# Patient Record
Sex: Male | Born: 1945
Health system: Southern US, Community
[De-identification: ages and names within clinical notes are randomized; demographics above are authoritative.]

## PROBLEM LIST (undated history)

## (undated) DIAGNOSIS — H3530 Unspecified macular degeneration: Secondary | ICD-10-CM

## (undated) DIAGNOSIS — H409 Unspecified glaucoma: Secondary | ICD-10-CM

## (undated) DIAGNOSIS — Z86010 Personal history of colonic polyps: Secondary | ICD-10-CM

## (undated) DIAGNOSIS — I6523 Occlusion and stenosis of bilateral carotid arteries: Secondary | ICD-10-CM

## (undated) DIAGNOSIS — I2510 Atherosclerotic heart disease of native coronary artery without angina pectoris: Secondary | ICD-10-CM

## (undated) DIAGNOSIS — Z87442 Personal history of urinary calculi: Secondary | ICD-10-CM

## (undated) DIAGNOSIS — I10 Essential (primary) hypertension: Secondary | ICD-10-CM

## (undated) DIAGNOSIS — I252 Old myocardial infarction: Secondary | ICD-10-CM

## (undated) DIAGNOSIS — K644 Residual hemorrhoidal skin tags: Secondary | ICD-10-CM

## (undated) DIAGNOSIS — N201 Calculus of ureter: Secondary | ICD-10-CM

## (undated) DIAGNOSIS — Z973 Presence of spectacles and contact lenses: Secondary | ICD-10-CM

## (undated) DIAGNOSIS — K219 Gastro-esophageal reflux disease without esophagitis: Secondary | ICD-10-CM

## (undated) DIAGNOSIS — Z860101 Personal history of adenomatous and serrated colon polyps: Secondary | ICD-10-CM

## (undated) DIAGNOSIS — N400 Enlarged prostate without lower urinary tract symptoms: Secondary | ICD-10-CM

## (undated) DIAGNOSIS — Z955 Presence of coronary angioplasty implant and graft: Secondary | ICD-10-CM

## (undated) DIAGNOSIS — E785 Hyperlipidemia, unspecified: Secondary | ICD-10-CM

## (undated) DIAGNOSIS — J309 Allergic rhinitis, unspecified: Secondary | ICD-10-CM

## (undated) DIAGNOSIS — N200 Calculus of kidney: Secondary | ICD-10-CM

## (undated) DIAGNOSIS — H269 Unspecified cataract: Secondary | ICD-10-CM

## (undated) HISTORY — DX: Unspecified cataract: H26.9

## (undated) HISTORY — DX: Hyperlipidemia, unspecified: E78.5

## (undated) HISTORY — PX: EYE SURGERY: SHX253

## (undated) HISTORY — DX: Unspecified glaucoma: H40.9

## (undated) HISTORY — DX: Essential (primary) hypertension: I10

## (undated) HISTORY — PX: CARDIOVASCULAR STRESS TEST: SHX262

## (undated) HISTORY — DX: Residual hemorrhoidal skin tags: K64.4

## (undated) HISTORY — DX: Gastro-esophageal reflux disease without esophagitis: K21.9

## (undated) HISTORY — PX: CORONARY ANGIOPLASTY WITH STENT PLACEMENT: SHX49

## (undated) HISTORY — DX: Atherosclerotic heart disease of native coronary artery without angina pectoris: I25.10

## (undated) HISTORY — PX: COLONOSCOPY: SHX174

---

## 2002-02-19 ENCOUNTER — Ambulatory Visit (HOSPITAL_COMMUNITY): Admission: RE | Admit: 2002-02-19 | Discharge: 2002-02-19 | Payer: Self-pay | Admitting: *Deleted

## 2002-02-19 ENCOUNTER — Encounter (INDEPENDENT_AMBULATORY_CARE_PROVIDER_SITE_OTHER): Payer: Self-pay | Admitting: Specialist

## 2007-03-31 LAB — HM COLONOSCOPY: HM Colonoscopy: ABNORMAL

## 2008-03-11 HISTORY — PX: CATARACT EXTRACTION W/ INTRAOCULAR LENS  IMPLANT, BILATERAL: SHX1307

## 2009-05-09 DIAGNOSIS — I2510 Atherosclerotic heart disease of native coronary artery without angina pectoris: Secondary | ICD-10-CM

## 2009-05-09 HISTORY — DX: Atherosclerotic heart disease of native coronary artery without angina pectoris: I25.10

## 2009-05-25 ENCOUNTER — Inpatient Hospital Stay (HOSPITAL_COMMUNITY): Admission: AD | Admit: 2009-05-25 | Discharge: 2009-05-27 | Payer: Self-pay | Admitting: Cardiovascular Disease

## 2009-05-25 ENCOUNTER — Ambulatory Visit: Payer: Self-pay | Admitting: Radiology

## 2009-05-25 ENCOUNTER — Encounter: Payer: Self-pay | Admitting: Emergency Medicine

## 2009-05-25 ENCOUNTER — Ambulatory Visit: Payer: Self-pay | Admitting: Cardiology

## 2009-05-26 DIAGNOSIS — I219 Acute myocardial infarction, unspecified: Secondary | ICD-10-CM

## 2009-05-26 DIAGNOSIS — I252 Old myocardial infarction: Secondary | ICD-10-CM

## 2009-05-26 DIAGNOSIS — Z955 Presence of coronary angioplasty implant and graft: Secondary | ICD-10-CM

## 2009-05-26 HISTORY — DX: Presence of coronary angioplasty implant and graft: Z95.5

## 2009-05-26 HISTORY — DX: Acute myocardial infarction, unspecified: I21.9

## 2009-05-26 HISTORY — DX: Old myocardial infarction: I25.2

## 2009-06-12 DIAGNOSIS — E782 Mixed hyperlipidemia: Secondary | ICD-10-CM

## 2009-06-12 DIAGNOSIS — I10 Essential (primary) hypertension: Secondary | ICD-10-CM | POA: Insufficient documentation

## 2009-06-12 DIAGNOSIS — H269 Unspecified cataract: Secondary | ICD-10-CM

## 2009-06-12 DIAGNOSIS — K219 Gastro-esophageal reflux disease without esophagitis: Secondary | ICD-10-CM | POA: Insufficient documentation

## 2009-06-13 ENCOUNTER — Ambulatory Visit: Payer: Self-pay | Admitting: Cardiology

## 2009-06-13 ENCOUNTER — Encounter: Payer: Self-pay | Admitting: Physician Assistant

## 2009-06-13 DIAGNOSIS — I2510 Atherosclerotic heart disease of native coronary artery without angina pectoris: Secondary | ICD-10-CM | POA: Insufficient documentation

## 2009-06-21 ENCOUNTER — Encounter: Payer: Self-pay | Admitting: Cardiology

## 2009-07-11 ENCOUNTER — Encounter: Payer: Self-pay | Admitting: Cardiology

## 2009-07-13 ENCOUNTER — Encounter (INDEPENDENT_AMBULATORY_CARE_PROVIDER_SITE_OTHER): Payer: Self-pay | Admitting: *Deleted

## 2009-07-13 LAB — CONVERTED CEMR LAB
AST: 18 units/L (ref 0–37)
Albumin: 4.7 g/dL (ref 3.5–5.2)
Alkaline Phosphatase: 79 units/L (ref 39–117)
Total CHOL/HDL Ratio: 2.8
VLDL: 28 mg/dL (ref 0–40)

## 2009-08-01 ENCOUNTER — Encounter: Payer: Self-pay | Admitting: Internal Medicine

## 2009-08-21 ENCOUNTER — Encounter: Payer: Self-pay | Admitting: Cardiology

## 2009-08-23 ENCOUNTER — Encounter: Payer: Self-pay | Admitting: Cardiology

## 2009-08-23 ENCOUNTER — Ambulatory Visit: Payer: Self-pay | Admitting: Cardiology

## 2009-09-01 ENCOUNTER — Encounter (INDEPENDENT_AMBULATORY_CARE_PROVIDER_SITE_OTHER): Payer: Self-pay | Admitting: *Deleted

## 2009-09-01 DIAGNOSIS — R0989 Other specified symptoms and signs involving the circulatory and respiratory systems: Secondary | ICD-10-CM | POA: Insufficient documentation

## 2009-09-08 ENCOUNTER — Encounter: Payer: Self-pay | Admitting: Cardiology

## 2009-09-08 ENCOUNTER — Ambulatory Visit: Payer: Self-pay

## 2009-10-09 ENCOUNTER — Telehealth: Payer: Self-pay | Admitting: Internal Medicine

## 2009-10-09 ENCOUNTER — Ambulatory Visit: Payer: Self-pay | Admitting: Internal Medicine

## 2009-10-09 DIAGNOSIS — N401 Enlarged prostate with lower urinary tract symptoms: Secondary | ICD-10-CM | POA: Insufficient documentation

## 2009-10-26 ENCOUNTER — Encounter: Payer: Self-pay | Admitting: Cardiovascular Disease

## 2009-11-20 ENCOUNTER — Ambulatory Visit: Payer: Self-pay | Admitting: Internal Medicine

## 2009-11-20 LAB — CONVERTED CEMR LAB
BUN: 16 mg/dL (ref 6–23)
CO2: 28 meq/L (ref 19–32)
Chloride: 105 meq/L (ref 96–112)
Creatinine, Ser: 0.8 mg/dL (ref 0.40–1.50)
PSA: 2.02 ng/mL (ref 0.10–4.00)
Potassium: 4.5 meq/L (ref 3.5–5.3)
Sodium: 142 meq/L (ref 135–145)

## 2009-11-21 ENCOUNTER — Encounter: Payer: Self-pay | Admitting: Internal Medicine

## 2010-01-01 ENCOUNTER — Ambulatory Visit: Payer: Self-pay | Admitting: Internal Medicine

## 2010-01-01 DIAGNOSIS — J309 Allergic rhinitis, unspecified: Secondary | ICD-10-CM | POA: Insufficient documentation

## 2010-02-06 ENCOUNTER — Ambulatory Visit: Payer: Self-pay | Admitting: Internal Medicine

## 2010-02-06 DIAGNOSIS — J018 Other acute sinusitis: Secondary | ICD-10-CM

## 2010-02-14 ENCOUNTER — Encounter: Payer: Self-pay | Admitting: Cardiology

## 2010-02-14 ENCOUNTER — Ambulatory Visit: Payer: Self-pay | Admitting: Cardiology

## 2010-02-15 ENCOUNTER — Encounter: Payer: Self-pay | Admitting: Cardiology

## 2010-02-16 ENCOUNTER — Encounter (INDEPENDENT_AMBULATORY_CARE_PROVIDER_SITE_OTHER): Payer: Self-pay | Admitting: *Deleted

## 2010-02-16 LAB — CONVERTED CEMR LAB
ALT: 20 units/L (ref 0–53)
AST: 16 units/L (ref 0–37)
Bilirubin, Direct: 0.1 mg/dL (ref 0.0–0.3)
Glucose, Bld: 84 mg/dL (ref 70–99)
HDL: 37 mg/dL — ABNORMAL LOW (ref >39)
Indirect Bilirubin: 0.5 mg/dL (ref 0.0–0.9)
LDL Cholesterol: 36 mg/dL (ref 0–99)
Total CHOL/HDL Ratio: 3
VLDL: 38 mg/dL (ref 0–40)

## 2010-04-10 NOTE — Miscellaneous (Signed)
Summary: Orders Update  Clinical Lists Changes  Orders: Added new Test order of T-Basic Metabolic Panel [id number]) - Signed Added new Test order of T-TSH [phone]) - Signed Added new Test order of T-PSA ([id number]) - Signed

## 2010-04-10 NOTE — Assessment & Plan Note (Signed)
Summary: [facility]   Visit Type:  Follow-up Primary Provider:  Dondra Spry DO  CC:  Cough.  History of Present Illness: Pleasant gentleman admitted in March 2011 with a non-ST elevation myocardial infarction. He underwent cardiac catheterization which revealed - Left main was normal.  The LAD had proximal calcification. There was long proximal 30% stenosis.  Circumflex in the AV groove had proximal 60% stenosis at a mid obtuse marginal.  This first large mid obtuse marginal had ostial 70% stenosis.  The right coronary artery had a proximal long 50% stenosis.  There was mid subtotal stenosis.  The EF was 50% with inferior hypokinesis. Patient had a drug-eluting stent to the right coronary artery at that time. Abdominal ultrasound in July of 2011 showed no aneurysm. I last saw him in June of 2011. Since then the patient denies any dyspnea on exertion, orthopnea, PND, pedal edema, palpitations, syncope or chest pain.  Current Medications (verified): 1)  Aspirin 81 Mg Tbec (Aspirin) .... Take One Tablet By Mouth Daily 2)  Plavix 75 Mg Tabs (Clopidogrel Bisulfate) .... Take One Daily 3)  Metoprolol Tartrate 25 Mg Tabs (Metoprolol Tartrate) .... Take 1/2 Twice Daily 4)  Nitrostat 0.4 Mg Subl (Nitroglycerin) .... Take As Needed 5)  Crestor 40 Mg Tabs (Rosuvastatin Calcium) .... Take One Daily 6)  Amlodipine Besylate 10 Mg Tabs (Amlodipine Besylate) .... Take One Daily 7)  Centrum Silver  Tabs (Multiple Vitamins-Minerals) .... Take One Daily 8)  Losartan Potassium 100 Mg Tabs (Losartan Potassium) .... Take One Daily 9)  Icaps  Caps (Multiple Vitamins-Minerals) .... Take 1 Capsule By Mouth Once A Day 10)  Cefuroxime Axetil 500 Mg Tabs (Cefuroxime Axetil) .... One By Mouth Two Times A Day 11)  Prednisone 10 Mg Tabs (Prednisone) .... 3 Tabs By Mouth Once Daily X 3 Days, 2 Tabs By Mouth Once Daily X 3 Days, 1 Tab By Mouth Once Daily X 3 Days 12)  Hydrocodone-Homatropine 5-1.5 Mg/28ml Syrp  (Hydrocodone-Homatropine) .... 5 Ml By Mouth At Bedtime As Needed For Cough  Allergies (verified): No Known Drug Allergies  Past History:  Past Medical History:  1. Gastroesophageal reflux disease.   2. Hyperlipidemia.   3. Hypertension times 8-10 years.     4. Cataracts.   5. Coronary artery disease   March 2011 with a non-ST elevation myocardial infarction.  cardiac catheterization which revealed - Left main was normal.  The LAD had proximal calcification. There was long proximal 30% stenosis.  Circumflex in the AV groove had proximal 60% stenosis at a mid obtuse marginal.  This first large mid obtuse marginal had ostial 70% stenosis.  The right coronary artery had a proximal long 50% stenosis.  There was mid subtotal stenosis.  The EF was 50% with inferior hypokinesis. Patient had a drug-eluting stent to the right coronary artery  6.  Kidney stone  7.  Large external hemorrhoids  Past Surgical History: Cataract surgery.  PTCA/stent colonoscopy - 2009  (polyp) [facility]   Social History: Reviewed history from 10/09/2009 and no changes required.  married -43 yrs  Myriam Jacobson [place]) grew up in [place] He has one son - two grandaugthers   He is retired.  He had   a 40 pack-year smoking history but has discontinued.   prev occuptation -  [employer] Alcohol use-no  Review of Systems       Recent URI with nonproductive cough but no fevers or chills, hemoptysis, dysphasia, odynophagia, melena, hematochezia, dysuria, hematuria, rash, seizure activity,  orthopnea, PND, pedal edema, claudication. Remaining systems are negative.   Vital Signs:  Patient profile:   65 year old male Height:      64 inches Weight:      157.25 pounds BMI:     27.09 Pulse rate:   89 / minute Pulse rhythm:   regular Resp:     18 per minute BP sitting:   130 / 70  (left arm) Cuff size:   large  Vitals Entered By: Vikki Ports (February 14, 2010 9:20 AM)  Physical Exam  General:   Well-developed well-nourished in no acute distress.  Skin is warm and dry.  HEENT is normal.  Neck is supple. No thyromegaly.  Chest is clear to auscultation with normal expansion.  Cardiovascular exam is regular rate and rhythm.  Abdominal exam nontender or distended. No masses palpated. Extremities show no edema. neuro grossly intact    EKG  Procedure date:  02/14/2010  Findings:      Sinus rhythm with nonspecific ST changes.  Impression & Recommendations:  Problem # 1:  CAD, NATIVE VESSEL (ICD-414.01) Continue aspirin, Plavix, beta blocker and statin. Continue risk factor modification. His updated medication list for this problem includes:    Aspirin 81 Mg Tbec (Aspirin) .Marland Kitchen... Take one tablet by mouth daily    Plavix 75 Mg Tabs (Clopidogrel bisulfate) .Marland Kitchen... Take one daily    Metoprolol Tartrate 25 Mg Tabs (Metoprolol tartrate) .Marland Kitchen... Take 1/2 twice daily    Nitrostat 0.4 Mg Subl (Nitroglycerin) .Marland Kitchen... Take as needed    Amlodipine Besylate 10 Mg Tabs (Amlodipine besylate) .Marland Kitchen... Take one daily  Problem # 2:  HYPERTENSION (ICD-401.9)  Blood pressure controlled on present medications. Will continue. Check potassium and renal function. His updated medication list for this problem includes:    Aspirin 81 Mg Tbec (Aspirin) .Marland Kitchen... Take one tablet by mouth daily    Metoprolol Tartrate 25 Mg Tabs (Metoprolol tartrate) .Marland Kitchen... Take 1/2 twice daily    Amlodipine Besylate 10 Mg Tabs (Amlodipine besylate) .Marland Kitchen... Take one daily    Losartan Potassium 100 Mg Tabs (Losartan potassium) .Marland Kitchen... Take one daily  Orders: T-Basic Metabolic Panel [id number])  His updated medication list for this problem includes:    Aspirin 81 Mg Tbec (Aspirin) .Marland Kitchen... Take one tablet by mouth daily    Metoprolol Tartrate 25 Mg Tabs (Metoprolol tartrate) .Marland Kitchen... Take 1/2 twice daily    Amlodipine Besylate 10 Mg Tabs (Amlodipine besylate) .Marland Kitchen... Take one daily    Losartan Potassium 100 Mg Tabs (Losartan potassium) .Marland Kitchen...  Take one daily  Problem # 3:  HYPERLIPIDEMIA (ICD-272.4) Continue statin. Check lipids and liver. His updated medication list for this problem includes:    Crestor 40 Mg Tabs (Rosuvastatin calcium) .Marland Kitchen... Take one daily  Orders: T-Hepatic Function [id number]) T-Lipid Profile [id number])  His updated medication list for this problem includes:    Crestor 40 Mg Tabs (Rosuvastatin calcium) .Marland Kitchen... Take one daily  Problem # 4:  RHINOSINUSITIS, ACUTE (ICD-461.8) Continue present medications initiated by primary care.  Patient Instructions: 1)  Your physician wants you to follow-up in: ONE YEAR  You will receive a reminder letter in the mail two months in advance. If you don't receive a letter, please call our office to schedule the follow-up appointment. Prescriptions: CRESTOR 40 MG TABS (ROSUVASTATIN CALCIUM) take one daily  #90 x 3   Entered by:   Deliah Goody, RN   Authorized by:   Ferman Hamming, MD, [facility]   Signed by:   Stanton Kidney  Betsey Holiday, RN on 02/14/2010   Method used:   Print then Give to Patient   RxID:   [id number] METOPROLOL TARTRATE 25 MG TABS (METOPROLOL TARTRATE) take 1/2 twice daily  #90 x 3   Entered by:   Deliah Goody, RN   Authorized by:   Ferman Hamming, MD, [facility]   Signed by:   Deliah Goody, RN on 02/14/2010   Method used:   Print then Give to Patient   RxID:   [id number] PLAVIX 75 MG TABS (CLOPIDOGREL BISULFATE) take one daily  #90 x 3   Entered by:   Deliah Goody, RN   Authorized by:   Ferman Hamming, MD, [facility]   Signed by:   Deliah Goody, RN on 02/14/2010   Method used:   Print then Give to Patient   RxID:   [id number]

## 2010-04-10 NOTE — Miscellaneous (Signed)
Summary: [facility] Cardiac Progress Note   [facility] Cardiac Progress Note   Imported By: Roderic Ovens 11/14/2009 11:43:44  _____________________________________________________________________  External Attachment:    Type:   Image     Comment:   External Document

## 2010-04-10 NOTE — Assessment & Plan Note (Signed)
Summary: [facility]   Visit Type:  Follow-up  CC:  no complaints.  History of Present Illness: This is a 65 year old white male [name] who suffered a non-ST elevation MI treated with drug-eluting stent to the proximal RCA May 25, 2009. He has residual 60% circumflex in the AV groove, 70% ostial OM1, and in fair hypokinesis. Ejection fraction 50%.  The [name] is feeling well, since he's been home from the hospital. He denies chest pain, palpitations, dyspnea, dyspnea on exertion, dizziness, or presyncope. 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 He has been diligent with his diet trying to eat low-fat, low-sodium diet. His blood pressure is borderline high today. He says his blood pressure runs 121-130/70 at home.  Current Medications (verified): 1)  Ecotrin 325 Mg Tbec (Aspirin) .... Take One Daily 2)  Plavix 75 Mg Tabs (Clopidogrel Bisulfate) .... Take One Daily 3)  Metoprolol Tartrate 25 Mg Tabs (Metoprolol Tartrate) .... Take 1/2 Twice Daily 4)  Nitrostat 0.4 Mg Subl (Nitroglycerin) .... Take As Needed 5)  Crestor 40 Mg Tabs (Rosuvastatin Calcium) .... Take One Daily 6)  Amlodipine Besylate 10 Mg Tabs (Amlodipine Besylate) .... Take One Daily 7)  Centrum Silver  Tabs (Multiple Vitamins-Minerals) .... Take One Daily 8)  Dexilant 60 Mg Cpdr (Dexlansoprazole) .... Take One As Needed 9)  Fish Oil 1000 Mg Caps (Omega-3 Fatty Acids) .... Take One Daily 10)  Losartan Potassium 100 Mg Tabs (Losartan Potassium) .... Take One Daily  Past History:  Past Medical History: Last updated: 06/12/2009  1. Gastroesophageal reflux disease.   2. Hyperlipidemia.   3. Hypertension times 8-10 years.   4. Cataracts.      Social History: Last updated: 06/12/2009  He is married.  He has one son.  He is retired.  He had   a 40 pack-year smoking history, quitting about 18 months ago.      Review of Systems       see history of present illness.  Vital Signs:  [name]  profile:   65 year old male Height:      65 inches Weight:      169 pounds BMI:     28.22 Pulse rate:   76 / minute Pulse rhythm:   regular BP sitting:   138 / 72  (left arm)  Vitals Entered By: Jacquelin Hawking, CMA (June 13, 2009 8:24 AM)  Physical Exam  General:   Well-nournished, in no acute distress. Neck: No JVD, HJR, Bruit, or thyroid enlargement Lungs: No tachypnea, clear without wheezing, rales, or rhonchi Cardiovascular: RRR, PMI not displaced, heart sounds normal, no murmurs, gallops, bruit, thrill, or heave. Abdomen: BS normal. Soft without organomegaly, masses, lesions or tenderness. Extremities: Right groin without hematoma or hemorrhage,without cyanosis, clubbing or edema. Good distal pulses bilateral SKin: Warm, no lesions or rashes  Musculoskeletal: No deformities Neuro: no focal signs    EKG  Procedure date:  06/13/2009  Findings:      normal sinus rhythm with inferior T-wave inversion  Impression & Recommendations:  Problem # 1:  CAD, NATIVE VESSEL (ICD-414.01)  [name] had a non-ST elevation MI treated with drug-eluting stent to the proximal RCA May 25, 2009. He has residual coronary artery disease as listed in the history of present illness. He is doing well without further chest pain. Cardiac rehabilitation is recommended. His updated medication list for this problem includes:    Ecotrin 325 Mg Tbec (Aspirin) .Marland Kitchen... Take one daily  Plavix 75 Mg Tabs (Clopidogrel bisulfate) .Marland Kitchen... Take one daily    Metoprolol Tartrate 25 Mg Tabs (Metoprolol tartrate) .Marland Kitchen... Take 1/2 twice daily    Nitrostat 0.4 Mg Subl (Nitroglycerin) .Marland Kitchen... Take as needed    Amlodipine Besylate 10 Mg Tabs (Amlodipine besylate) .Marland Kitchen... Take one daily  Orders: EKG w/ Interpretation (93000)  Problem # 2:  HYPERTENSION (ICD-401.9) [name] has borderline elevation of his blood pressure. I discussed 2 g sodium diet with him. We will give him a diet to take home. His updated medication list  for this problem includes:    Ecotrin 325 Mg Tbec (Aspirin) .Marland Kitchen... Take one daily    Metoprolol Tartrate 25 Mg Tabs (Metoprolol tartrate) .Marland Kitchen... Take 1/2 twice daily    Amlodipine Besylate 10 Mg Tabs (Amlodipine besylate) .Marland Kitchen... Take one daily    Losartan Potassium 100 Mg Tabs (Losartan potassium) .Marland Kitchen... Take one daily  Problem # 3:  HYPERLIPIDEMIA (ICD-272.4)  [name] should have a fasting lipid panel and LFTs in 4-6 weeks. His updated medication list for this problem includes:    Crestor 40 Mg Tabs (Rosuvastatin calcium) .Marland Kitchen... Take one daily  Orders: EKG w/ Interpretation (93000)  [name] Instructions: 1)  Your physician recommends that you schedule a follow-up appointment in: 2 months with Dr. Jens Som in [facility].  2)  Your physician has requested that you limit the intake of sodium (salt) in your diet to two grams daily. Please see [facility] handout. 3)   Cardiac Re-hab. Pt. has been reffered. 4)  Your physician recommends that you return for a FASTING lipid profile: and LFT's in 4-6 weeks

## 2010-04-10 NOTE — Letter (Signed)
Summary: Records from [facility] 2009 - 2011  Records from [facility] 2009 - 2011   Imported By: Maryln Gottron 01/05/2010 15:43:19  _____________________________________________________________________  External Attachment:    Type:   Image     Comment:   External Document

## 2010-04-10 NOTE — Miscellaneous (Signed)
Summary: Zostavax  Clinical Lists Changes  Observations: Added new observation of ZOSTAVAX: Zostavax (05/30/2008 12:02)      Immunization History:  Zostavax History:    Zostavax # 1:  zostavax (05/30/2008)

## 2010-04-10 NOTE — Letter (Signed)
Summary: [facility] - Lipid  [facility], Main Office  [address]   [place], [place] [address]   Phone: [phone]  Fax: [phone]     Jul 13, 2009 MRN: [id number]   Anthony [name] [address] [place], [place]  [address]   Dear [name],  We have reviewed your cholesterol results.  They are as follows:     Total Cholesterol:    95 (Desirable: less than 200)       HDL  Cholesterol:     34  (Desirable: greater than 40 for men and 50 for women)       LDL Cholesterol:       33  (Desirable: less than 100 for low risk and less than 70 for moderate to high risk)       Triglycerides:       141  (Desirable: less than 150)  Our recommendations include:These numbers look good. Continue on the same medicine. Liver function is normal. Take care, Dr. Darel Hong.    Call our office at the number listed above if you have any questions.  Lowering your LDL cholesterol is important, but it is only one of a large number of "risk factors" that may indicate that you are at risk for heart disease, stroke or other complications of hardening of the arteries.  Other risk factors include:   A.  Cigarette Smoking* B.  High Blood Pressure* C.  Obesity* D.   Low HDL Cholesterol (see yours above)* E.   Diabetes Mellitus (higher risk if your is uncontrolled) F.  Family history of premature heart disease G.  Previous history of stroke or cardiovascular disease    *These are risk factors YOU HAVE CONTROL OVER.  For more information, visit .  There is now evidence that lowering the TOTAL CHOLESTEROL AND LDL CHOLESTEROL can reduce the risk of heart disease.  The American Heart Association recommends the following guidelines for the treatment of elevated cholesterol:  1.  If there is now current heart disease and less than two risk factors, TOTAL CHOLESTEROL should be less than 200 and LDL CHOLESTEROL should be less than 100. 2.  If there is current heart disease or two or more  risk factors, TOTAL CHOLESTEROL should be less than 200 and LDL CHOLESTEROL should be less than 70.  A diet low in cholesterol, saturated fat, and calories is the cornerstone of treatment for elevated cholesterol.  Cessation of smoking and exercise are also important in the management of elevated cholesterol and preventing vascular disease.  Studies have shown that 30 to 60 minutes of physical activity most days can help lower blood pressure, lower cholesterol, and keep your weight at a healthy level.  Drug therapy is used when cholesterol levels do not respond to therapeutic lifestyle changes (smoking cessation, diet, and exercise) and remains unacceptably high.  If medication is started, it is important to have you levels checked periodically to evaluate the need for further treatment options.  Thank you,    [employer] Team

## 2010-04-10 NOTE — Progress Notes (Signed)
Summary: faxed [facility] for records   Phone Note Outgoing Call   Call placed by: [facility] Call placed to: Marj  Summary of Call: faxed [facility] for records  Initial call taken by: Roselle Locus,  October 09, 2009 9:33 AM

## 2010-04-10 NOTE — Assessment & Plan Note (Signed)
Summary: flu shot/dt  Nurse Visit   Allergies: No Known Drug Allergies  Immunizations Administered:  Influenza Vaccine # 1:    Vaccine Type: Fluvax Non-MCR    Site: left deltoid    Mfr: GlaxoSmithKline    Dose: 0.5 ml    Route: IM    Given by: Glendell Docker CMA    Exp. Date: 09/08/2010    Lot #: [id number]     VIS given: 10/03/09 version given November 20, 2009.  Flu Vaccine Consent Questions:    Do you have a history of severe allergic reactions to this vaccine? no    Any prior history of allergic reactions to egg and/or gelatin? no    Do you have a sensitivity to the preservative Thimersol? no    Do you have a past history of Guillan-Barre Syndrome? no    Do you currently have an acute febrile illness? no    Have you ever had a severe reaction to latex? no    Vaccine information given and explained to patient? yes  Orders Added: 1)  Influenza Vaccine NON MCR [00028] 2)  Flu Vaccine 50yrs + MEDICARE PATIENTS [Q2039]

## 2010-04-10 NOTE — Letter (Signed)
Summary: [facility] - Standard Monthly Report  [facility] - Standard Monthly Report   Imported By: Marylou Mccoy 12/04/2009 12:48:16  _____________________________________________________________________  External Attachment:    Type:   Image     Comment:   External Document

## 2010-04-10 NOTE — Assessment & Plan Note (Signed)
Summary: NEW TO EST/MHF   Vital Signs:  Patient profile:   65 year old male Height:      64 inches Weight:      159.75 pounds BMI:     27.52 O2 Sat:      100 % on Room air Temp:     98.4 degrees F oral Pulse rate:   58 / minute Pulse rhythm:   regular Resp:     16 per minute BP sitting:   110 / 58  (right arm) Cuff size:   regular  Vitals Entered By: Glendell Docker CMA (October 09, 2009 9:25 AM)  O2 Flow:  Room air CC: Rm 2- New Patient  Is Patient Diabetic? No Pain Assessment Patient in pain? no       Does patient need assistance? Functional Status Self care Ambulation Normal Comments establish care, discuss physical    Primary Care Provider:  Dondra Spry DO  CC:  Rm 2- New Patient .  History of Present Illness: 65 y/o white male to establish he is switching from Dr. Claren[facility])  He has hx of CAD.  he reports neck and upper back pain after eating in March he was evaluated in ER and noted to have elevated CE cath with stent placement in March feels great since procedure no indigestion since procedure upper back pain also resolved  htn - no dizziness.  good med compliance hyperlipidemia - no muscle aches,  exercises / walks regularly  no exercise intolerance  Preventative health -  zostavax 1 yr ago pneumovax given during hosp for cath  Aorta US - normal  no prev carotid  hosp lab results reviewed  Preventive Screening-Counseling & Management  Alcohol-Tobacco     Alcohol drinks/day: 0     Smoking Status: quit     Packs/Day: 0.5     Year Started: 1963     Year Quit: 2009  Caffeine-Diet-Exercise     Caffeine use/day: 2 beverages daily     Does Patient Exercise: yes     Type of exercise: walking     Times/week: 5  Allergies (verified): No Known Drug Allergies  Past History:  Past Medical History:  1. Gastroesophageal reflux disease.   2. Hyperlipidemia.   3. Hypertension times 8-10 years.   4. Cataracts.   5. coronary artery  disease   March 2011 with a non-ST elevation myocardial infarction.  cardiac catheterization which revealed - Left main was normal.  The LAD had proximal calcification. There was long proximal 30% stenosis.  Circumflex in the AV groove had proximal 60% stenosis at a mid obtuse marginal.  This first large mid obtuse marginal had ostial 70% stenosis.  The right coronary artery had a proximal long 50% stenosis.  There was mid subtotal stenosis.  The EF was 50% with inferior hypokinesis. Patient had a drug-eluting stent to the right coronary artery  6.  Kidney stone  7:  Former smoker  8.  Large external hemorrhoids  Past Surgical History: Cataract surgery.     PTCA/stent colonoscopy - 2009  (polyp) [facility]  Family History:  Contributory for his father having coronary disease in   his sixties.  His mother died of heart disease, but at a later age.  He  has two older sisters without heart disease.    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  He had   a 40 pack-year smoking history but has discontinued.   prev occuptation -  [employer] Alcohol use-no Smoking Status:  quit Packs/Day:  0.5 Caffeine use/day:  2 beverages daily Does Patient Exercise:  yes   Review of Systems  The patient denies weight gain, chest pain, syncope, dyspnea on exertion, abdominal pain, and depression.   GU:  Complains of nocturia; denies erectile dysfunction, urinary frequency, and urinary hesitancy.  Physical Exam  General:  alert, well-developed, and well-nourished.   Head:  normocephalic and atraumatic.   Eyes:  pupils equal, pupils round, and pupils reactive to light.   Ears:  right ear cerumen and L ear normal.   Mouth:  good dentition and pharynx pink and moist.   Neck:  No deformities, masses, or tenderness noted.no carotid bruits.   Lungs:  normal respiratory effort,  normal breath sounds, no crackles, and no wheezes.   Heart:  normal rate, regular rhythm, no murmur, and no gallop.   Abdomen:  soft, abd obesity, non-tender, normal bowel sounds, no hepatomegaly, and no splenomegaly.   Rectal:  large ext hemorrhoids,  normal sphincter tone and no masses.   Prostate:  no nodules, no asymmetry, and 1+ enlarged.   Pulses:  dorsalis pedis and posterior tibial pulses are full and equal bilaterally Extremities:  No lower extremity edema  Neurologic:  cranial nerves II-XII intact and gait normal.   Psych:  normally interactive, good eye contact, not anxious appearing, and not depressed appearing.     Impression & Recommendations:  Problem # 1:  HYPERTENSION (ICD-401.9) Assessment Unchanged  His updated medication list for this problem includes:    Metoprolol Tartrate 25 Mg Tabs (Metoprolol tartrate) .Marland Kitchen... Take 1/2 twice daily    Amlodipine Besylate 10 Mg Tabs (Amlodipine besylate) .Marland Kitchen... Take one daily    Losartan Potassium 100 Mg Tabs (Losartan potassium) .Marland Kitchen... Take one daily  BP today: 110/58 Prior BP: 128/70 (08/23/2009)  Labs Reviewed: Chol: 95 (07/11/2009)   HDL: 34 (07/11/2009)   LDL: 33 (07/11/2009)   TG: 141 (07/11/2009)  Problem # 2:  HYPERLIPIDEMIA (ICD-272.4) LDL at goal.  tolerating crestor  His updated medication list for this problem includes:    Crestor 40 Mg Tabs (Rosuvastatin calcium) .Marland Kitchen... Take one daily  Labs Reviewed: SGOT: 18 (07/11/2009)   SGPT: 22 (07/11/2009)   HDL:34 (07/11/2009)  LDL:33 (07/11/2009)  Chol:95 (07/11/2009)  Trig:141 (07/11/2009)  Problem # 3:  BENIGN PROSTATIC HYPERTROPHY, WITH URINARY OBSTRUCTION (ICD-600.01) slight prostate enlargement.  nocturia x 1 but no significant obstructive symptoms pt advised to avoid caffeine. check PSA with next OV.   we discussed starting proscar if obstructiev symptoms get worse  Complete Medication List: 1)  Aspirin 81 Mg Tbec (Aspirin) .... Take one tablet by mouth  daily 2)  Plavix 75 Mg Tabs (Clopidogrel bisulfate) .... Take one daily 3)  Metoprolol Tartrate 25 Mg Tabs (Metoprolol tartrate) .... Take 1/2 twice daily 4)  Nitrostat 0.4 Mg Subl (Nitroglycerin) .... Take as needed 5)  Crestor 40 Mg Tabs (Rosuvastatin calcium) .... Take one daily 6)  Amlodipine Besylate 10 Mg Tabs (Amlodipine besylate) .... Take one daily 7)  Centrum Silver Tabs (Multiple vitamins-minerals) .... Take one daily 8)  Losartan Potassium 100 Mg Tabs (Losartan potassium) .... Take one daily 9)  Icaps Caps (Multiple vitamins-minerals) .... Take 1 capsule by mouth once a day  Patient Instructions: 1)  Please schedule a follow-up appointment in 6 months. 2)  BMP prior  to visit, ICD-9:  401.9 3)  TSH prior to visit, ICD-9: 272.4 4)  PSA prior to visit, ICD-9: v76.44 5)  Please return for lab in September.   Prescriptions: LOSARTAN POTASSIUM 100 MG TABS (LOSARTAN POTASSIUM) take one daily  #90 x 1   Entered and Authorized by:   D. Thomos Lemons DO   Signed by:   D. Thomos Lemons DO on 10/09/2009   Method used:   Print then Give to Patient   RxID:   [id number] AMLODIPINE BESYLATE 10 MG TABS (AMLODIPINE BESYLATE) take one daily  #90 x 1   Entered and Authorized by:   D. Thomos Lemons DO   Signed by:   D. Thomos Lemons DO on 10/09/2009   Method used:   Print then Give to Patient   RxID:   [id number] CRESTOR 40 MG TABS (ROSUVASTATIN CALCIUM) take one daily  #90 x 1   Entered and Authorized by:   D. Thomos Lemons DO   Signed by:   D. Thomos Lemons DO on 10/09/2009   Method used:   Print then Give to Patient   RxID:   [id number] METOPROLOL TARTRATE 25 MG TABS (METOPROLOL TARTRATE) take 1/2 twice daily  #90 x 1   Entered and Authorized by:   D. Thomos Lemons DO   Signed by:   D. Thomos Lemons DO on 10/09/2009   Method used:   Print then Give to Patient   RxID:   [id number] PLAVIX 75 MG TABS (CLOPIDOGREL BISULFATE) take one daily  #90 x 1   Entered and Authorized by:   D. Thomos Lemons DO   Signed by:   D. Thomos Lemons DO on 10/09/2009   Method used:   Print then Give to Patient   RxID:   [id number]   Current Allergies (reviewed today): No known allergies    Preventive Care Screening  Last Pneumovax:    Date:  05/11/2009    Results:  given   Last Flu Shot:    Date:  01/06/2009    Results:  given   Last Tetanus Booster:    Date:  10/27/2007    Results:  Historical   Colonoscopy:    Date:  03/31/2007    Results:  abnormal

## 2010-04-10 NOTE — Letter (Signed)
   [place] [facility] [address] [place], [place]  [address]  [place] Phone: [phone]      November 21, 2009   Anthony [name] [address] [facility] [address] [place], [place] [address]  RE:  LAB RESULTS  Dear  Mr. Wechter,  The following is an interpretation of your most recent lab tests.  Please take note of any instructions provided or changes to medications that have resulted from your lab work.  PSA:  normal - no follow-up needed PSA: 2.02  ELECTROLYTES:  Good - no changes needed  KIDNEY FUNCTION TESTS:  Good - no changes needed   THYROID STUDIES:  Thyroid studies normal TSH: 2.580          Sincerely Yours,    Dr. Thomos Lemons  Appended Document:  mailed

## 2010-04-10 NOTE — Letter (Signed)
Summary: Custom - Lipid  [facility], Main Office  [address]   [place], [place] [address]   Phone: [phone]  Fax: [phone]     February 16, 2010 MRN: [id number]   DELPHIN FUNES [address] [facility] [address] [place], [place]  [address]   Dear Mr. Visscher,  We have reviewed your cholesterol results.  They are as follows:     Total Cholesterol:    111 (Desirable: less than 200)       HDL  Cholesterol:     37  (Desirable: greater than 40 for men and 50 for women)       LDL Cholesterol:       36  (Desirable: less than 100 for low risk and less than 70 for moderate to high risk)       Triglycerides:       192  (Desirable: less than 150)  Our recommendations include:These numbers look good. Continue on the same medicine. Sodium, potassium, kidney and Liver function are normal. Take care, Dr. Darel Hong.    Call our office at the number listed above if you have any questions.  Lowering your LDL cholesterol is important, but it is only one of a large number of "risk factors" that may indicate that you are at risk for heart disease, stroke or other complications of hardening of the arteries.  Other risk factors include:   A.  Cigarette Smoking* B.  High Blood Pressure* C.  Obesity* D.   Low HDL Cholesterol (see yours above)* E.   Diabetes Mellitus (higher risk if your is uncontrolled) F.  Family history of premature heart disease G.  Previous history of stroke or cardiovascular disease    *These are risk factors YOU HAVE CONTROL OVER.  For more information, visit .  There is now evidence that lowering the TOTAL CHOLESTEROL AND LDL CHOLESTEROL can reduce the risk of heart disease.  The American Heart Association recommends the following guidelines for the treatment of elevated cholesterol:  1.  If there is now current heart disease and less than two risk factors, TOTAL CHOLESTEROL should be less than 200 and LDL CHOLESTEROL should be less than 100. 2.  If there is  current heart disease or two or more risk factors, TOTAL CHOLESTEROL should be less than 200 and LDL CHOLESTEROL should be less than 70.  A diet low in cholesterol, saturated fat, and calories is the cornerstone of treatment for elevated cholesterol.  Cessation of smoking and exercise are also important in the management of elevated cholesterol and preventing vascular disease.  Studies have shown that 30 to 60 minutes of physical activity most days can help lower blood pressure, lower cholesterol, and keep your weight at a healthy level.  Drug therapy is used when cholesterol levels do not respond to therapeutic lifestyle changes (smoking cessation, diet, and exercise) and remains unacceptably high.  If medication is started, it is important to have you levels checked periodically to evaluate the need for further treatment options.  Thank you,    [employer] Team

## 2010-04-10 NOTE — Assessment & Plan Note (Signed)
Summary: Anthony [name]   Visit Type:  Follow-up  CC:  No complains.  History of Present Illness: Pleasant gentleman admitted in March 2011 with a non-ST elevation myocardial infarction. He underwent cardiac catheterization which revealed - Left main was normal.  The LAD had proximal calcification. There was long proximal 30% stenosis.  Circumflex in the AV groove had proximal 60% stenosis at a mid obtuse marginal.  This first large mid obtuse marginal had ostial 70% stenosis.  The right coronary artery had a proximal long 50% stenosis.  There was mid subtotal stenosis.  The EF was 50% with inferior hypokinesis. Patient had a drug-eluting stent to the right coronary artery at that time. Since then the patient denies any dyspnea on exertion, orthopnea, PND, pedal edema, palpitations, syncope or chest pain.   Current Medications (verified): 1)  Ecotrin 325 Mg Tbec (Aspirin) .... Take One Daily 2)  Plavix 75 Mg Tabs (Clopidogrel Bisulfate) .... Take One Daily 3)  Metoprolol Tartrate 25 Mg Tabs (Metoprolol Tartrate) .... Take 1/2 Twice Daily 4)  Nitrostat 0.4 Mg Subl (Nitroglycerin) .... Take As Needed 5)  Crestor 40 Mg Tabs (Rosuvastatin Calcium) .... Take One Daily 6)  Amlodipine Besylate 10 Mg Tabs (Amlodipine Besylate) .... Take One Daily 7)  Centrum Silver  Tabs (Multiple Vitamins-Minerals) .... Take One Daily 8)  Losartan Potassium 100 Mg Tabs (Losartan Potassium) .... Take One Daily  Allergies (verified): No Known Drug Allergies  Past History:  Past Medical History:  1. Gastroesophageal reflux disease.   2. Hyperlipidemia.   3. Hypertension times 8-10 years.   4. Cataracts.   5. coronary artery disease     Past Surgical History: Reviewed history from 06/12/2009 and no changes required. Cataract surgery.      Social History: Reviewed history from 06/12/2009 and no changes required.  He is married.  He has one son.  He is retired.  He had   a 40 pack-year smoking  history but has discontinued.     Review of Systems       no fevers or chills, productive cough, hemoptysis, dysphasia, odynophagia, melena, hematochezia, dysuria, hematuria, rash, seizure activity, orthopnea, PND, pedal edema, claudication. Remaining systems are negative.   Vital Signs:  Patient profile:   65 year old male Height:      65 inches Weight:      161.75 pounds BMI:     27.01 Pulse rate:   68 / minute Pulse rhythm:   regular Resp:     18 per minute BP sitting:   128 / 70  (left arm) Cuff size:   large  Vitals Entered By: Vikki Ports (August 23, 2009 8:58 AM)  Physical Exam  General:  Well-developed well-nourished in no acute distress.  Skin is warm and dry.  HEENT is normal.  Neck is supple. No thyromegaly.  Chest is clear to auscultation with normal expansion.  Cardiovascular exam is regular rate and rhythm.  Abdominal exam nontender or distended. No masses palpated. Extremities show no edema. neuro grossly intact    Impression & Recommendations:  Problem # 1:  CAD, NATIVE VESSEL (ICD-414.01) Continue aspirin, Plavix, beta blocker and statin. Continue risk factor modification. I will decrease aspirin to 81 mg p.o. daily. His updated medication list for this problem includes:    Aspirin 81 Mg Tbec (Aspirin) .Marland Kitchen... Take one tablet by mouth daily    Plavix 75 Mg Tabs (Clopidogrel bisulfate) .Marland Kitchen... Take one daily    Metoprolol Tartrate 25 Mg Tabs (Metoprolol tartrate) .Marland KitchenMarland KitchenMarland KitchenMarland Kitchen  Take 1/2 twice daily    Nitrostat 0.4 Mg Subl (Nitroglycerin) .Marland Kitchen... Take as needed    Amlodipine Besylate 10 Mg Tabs (Amlodipine besylate) .Marland Kitchen... Take one daily  Problem # 2:  HYPERTENSION (ICD-401.9) Blood pressure controlled on present medications. Will continue. His updated medication list for this problem includes:    Aspirin 81 Mg Tbec (Aspirin) .Marland Kitchen... Take one tablet by mouth daily    Metoprolol Tartrate 25 Mg Tabs (Metoprolol tartrate) .Marland Kitchen... Take 1/2 twice daily    Amlodipine  Besylate 10 Mg Tabs (Amlodipine besylate) .Marland Kitchen... Take one daily    Losartan Potassium 100 Mg Tabs (Losartan potassium) .Marland Kitchen... Take one daily  Problem # 3:  HYPERLIPIDEMIA (ICD-272.4) Continue statin. His updated medication list for this problem includes:    Crestor 40 Mg Tabs (Rosuvastatin calcium) .Marland Kitchen... Take one daily  Problem # 4:  GERD (ICD-530.81)  The following medications were removed from the medication list:    Dexilant 60 Mg Cpdr (Dexlansoprazole) .Marland Kitchen... Take one as needed  The following medications were removed from the medication list:    Dexilant 60 Mg Cpdr (Dexlansoprazole) .Marland Kitchen... Take one as needed  Patient Instructions: 1)  Your physician recommends that you schedule a follow-up appointment in: 6 MONTHS 2)  Your physician has recommended you make the following change in your medication: DECREASE ASPIRIN TO 81MG  ONCE DAILY

## 2010-04-10 NOTE — Letter (Signed)
Summary: [facility]   Imported By: Marylou Mccoy 08/16/2009 14:49:06  _____________________________________________________________________  External Attachment:    Type:   Image     Comment:   External Document

## 2010-04-10 NOTE — Assessment & Plan Note (Signed)
Summary: feels bad/mhf   Vital Signs:  Patient profile:   65 year old Anthony [name] Height:      64 inches Weight:      159.75 pounds BMI:     27.52 O2 Sat:      99 % on Room air Temp:     98.2 degrees F oral Pulse rate:   93 / minute Pulse rhythm:   regular Resp:     20 per minute BP sitting:   122 / 60  (left arm) Cuff size:   large  Vitals Entered By: Glendell Docker CMA (January 01, 2010 9:00 AM)  O2 Flow:  Room air CC: Head Congestion Is Patient Diabetic? No Pain Assessment Patient in pain? no      Comments c/o head congestion, productive cough, no self care meaures, onset 4 days ago, denies temperature   Primary Care Provider:  Dondra Spry DO  CC:  Head Congestion.  History of Present Illness: 65 y/o Anthony [name] c/o 1 week of head congestion [place]  some cough - non productive no fever eyes feel puffy and irritated  Allergies (verified): No Known Drug Allergies  Past History:  Past Medical History:  1. Gastroesophageal reflux disease.   2. Hyperlipidemia.   3. Hypertension times 8-10 years.    4. Cataracts.   5. coronary artery disease   March 2011 with a non-ST elevation myocardial infarction.  cardiac catheterization which revealed - Left main was normal.  The LAD had proximal calcification. There was long proximal 30% stenosis.  Circumflex in the AV groove had proximal 60% stenosis at a mid obtuse marginal.  This first large mid obtuse marginal had ostial 70% stenosis.  The right coronary artery had a proximal long 50% stenosis.  There was mid subtotal stenosis.  The EF was 50% with inferior hypokinesis. Patient had a drug-eluting stent to the right coronary artery  6.  Kidney stone  7:  Former smoker  8.  Large external hemorrhoids  Past Surgical History: Cataract surgery.     PTCA/stent colonoscopy - 2009  (polyp) [facility]   Physical Exam  General:  alert, well-developed, and well-nourished.   Ears:  left cerumen  impaction Lungs:  normal respiratory effort and normal breath sounds.   Heart:  normal rate, regular rhythm, no murmur, and no gallop.     Impression & Recommendations:  Problem # 1:  ALLERGIC RHINITIS (ICD-477.9)  His updated medication list for this problem includes:    Fexofenadine Hcl 180 Mg Tabs (Fexofenadine hcl) ..... One by mouth once daily    Fluticasone Propionate 50 Mcg/act Susp (Fluticasone propionate) .Marland Kitchen... 2 sprays each nostril once daily  Discussed use of allergy medications and environmental measures.   Complete Medication List: 1)  Aspirin 81 Mg Tbec (Aspirin) .... Take one tablet by mouth daily 2)  Plavix 75 Mg Tabs (Clopidogrel bisulfate) .... Take one daily 3)  Metoprolol Tartrate 25 Mg Tabs (Metoprolol tartrate) .... Take 1/2 twice daily 4)  Nitrostat 0.4 Mg Subl (Nitroglycerin) .... Take as needed 5)  Crestor 40 Mg Tabs (Rosuvastatin calcium) .... Take one daily 6)  Amlodipine Besylate 10 Mg Tabs (Amlodipine besylate) .... Take one daily 7)  Centrum Silver Tabs (Multiple vitamins-minerals) .... Take one daily 8)  Losartan Potassium 100 Mg Tabs (Losartan potassium) .... Take one daily 9)  Icaps Caps (Multiple vitamins-minerals) .... Take 1 capsule by mouth once a day 10)  Icaps Caps (Multiple vitamins-minerals) .... Take 1 tablet  by mouth once a day 11)  Fexofenadine Hcl 180 Mg Tabs (Fexofenadine hcl) .... One by mouth once daily 12)  Fluticasone Propionate 50 Mcg/act Susp (Fluticasone propionate) .... 2 sprays each nostril once daily  Patient Instructions: 1)  OK to use mucinex 2)  Use neil med sinus rinse over the counter 3)  Call our office if your symptoms do not  improve or gets worse. Prescriptions: FLUTICASONE PROPIONATE 50 MCG/ACT SUSP (FLUTICASONE PROPIONATE) 2 sprays each nostril once daily  #1 x 2   Entered and Authorized by:   D. Thomos Lemons DO   Signed by:   D. Thomos Lemons DO on 01/01/2010   Method used:   Electronically to        [facility] [id number][facility] (retail)       [address]       [place], [place]  [address]       Ph: [phone]       Fax: [phone]   RxID:   [id number] FEXOFENADINE HCL 180 MG TABS (FEXOFENADINE HCL) one by mouth once daily  #30 x 1   Entered and Authorized by:   D. Thomos Lemons DO   Signed by:   D. Thomos Lemons DO on 01/01/2010   Method used:   [facility]ronically to        [facility] [id number][facility] (retail)       [address]       [place], [place]  [address]       Ph: [phone]       Fax: [phone]   RxID:   [id number]    Orders Added: 1)  Est. Patient Level III [09323]    Current Allergies (reviewed today): No known allergies

## 2010-04-10 NOTE — Miscellaneous (Signed)
Summary: Orders Update  Clinical Lists Changes  Problems: Added new problem of ABDOMINAL BRUIT (ICD-785.9) Orders: Added new Test order of Abdominal Aorta Duplex (Abd Aorta Duplex) - Signed 

## 2010-04-10 NOTE — Assessment & Plan Note (Signed)
Summary: cough congestion/mhf   Vital Signs:  Patient profile:   65 year old male Height:      64 inches Weight:      159.75 pounds BMI:     27.52 O2 Sat:      94 % on Room air Temp:     98.3 degrees F oral Pulse rate:   81 / minute Resp:     20 per minute BP sitting:   112 / 60  (right arm) Cuff size:   regular  Vitals Entered By: Glendell Docker CMA (February 06, 2010 8:16 AM)  O2 Flow:  Room air CC: Cough Is Patient Diabetic? No Pain Assessment Patient in pain? no        Primary Care Daelyn Pettaway:  Dondra Spry DO  CC:  Cough.  History of Present Illness: 65 y/o white male c/o upper resp congestion started 3 wks ago, got better then worse symptoms worse with colder weather chest feels fine cough disrupts sleep - slept only 1 hr last night bilateral facial pressure   Preventive Screening-Counseling & Management  Alcohol-Tobacco     Smoking Status: quit  Allergies (verified): No Known Drug Allergies  Past History:  Past Medical History:  1. Gastroesophageal reflux disease.   2. Hyperlipidemia.   3. Hypertension times 8-10 years.     4. Cataracts.   5. coronary artery disease   March 2011 with a non-ST elevation myocardial infarction.  cardiac catheterization which revealed - Left main was normal.  The LAD had proximal calcification. There was long proximal 30% stenosis.  Circumflex in the AV groove had proximal 60% stenosis at a mid obtuse marginal.  This first large mid obtuse marginal had ostial 70% stenosis.  The right coronary artery had a proximal long 50% stenosis.  There was mid subtotal stenosis.  The EF was 50% with inferior hypokinesis. Patient had a drug-eluting stent to the right coronary artery  6.  Kidney stone  7:  Former smoker  8.  Large external hemorrhoids  Physical Exam  General:  alert, well-developed, and well-nourished.   Ears:  R ear normal and L ear normal.   Nose:  mucosal erythema and mucosal edema.   Mouth:  pharyngeal  erythema.   Lungs:  normal respiratory effort and normal breath sounds.   Heart:  normal rate, regular rhythm, no murmur, and no gallop.     Impression & Recommendations:  Problem # 1:  RHINOSINUSITIS, ACUTE (ICD-461.8)  The following medications were removed from the medication list:    Fluticasone Propionate 50 Mcg/act Susp (Fluticasone propionate) .Marland Kitchen... 2 sprays each nostril once daily His updated medication list for this problem includes:    Cefuroxime Axetil 500 Mg Tabs (Cefuroxime axetil) ..... One by mouth two times a day    Hydrocodone-homatropine 5-1.5 Mg/50ml Syrp (Hydrocodone-homatropine) .Marland KitchenMarland KitchenMarland KitchenMarland Kitchen 5 ml by mouth at bedtime as needed for cough  Instructed on treatment. Call if symptoms persist or worsen.   Problem # 2:  HYPERTENSION (ICD-401.9)  His updated medication list for this problem includes:    Metoprolol Tartrate 25 Mg Tabs (Metoprolol tartrate) .Marland Kitchen... Take 1/2 twice daily    Amlodipine Besylate 10 Mg Tabs (Amlodipine besylate) .Marland Kitchen... Take one daily    Losartan Potassium 100 Mg Tabs (Losartan potassium) .Marland Kitchen... Take one daily  BP today: 112/60 Prior BP: 122/60 (01/01/2010)  Labs Reviewed: K+: 4.5 (11/20/2009) Creat: : 0.80 (11/20/2009)   Chol: 95 (07/11/2009)   HDL: 34 (07/11/2009)   LDL: 33 (07/11/2009)   TG:  141 (07/11/2009)  Complete Medication List: 1)  Aspirin 81 Mg Tbec (Aspirin) .... Take one tablet by mouth daily 2)  Plavix 75 Mg Tabs (Clopidogrel bisulfate) .... Take one daily 3)  Metoprolol Tartrate 25 Mg Tabs (Metoprolol tartrate) .... Take 1/2 twice daily 4)  Nitrostat 0.4 Mg Subl (Nitroglycerin) .... Take as needed 5)  Crestor 40 Mg Tabs (Rosuvastatin calcium) .... Take one daily 6)  Amlodipine Besylate 10 Mg Tabs (Amlodipine besylate) .... Take one daily 7)  Centrum Silver Tabs (Multiple vitamins-minerals) .... Take one daily 8)  Losartan Potassium 100 Mg Tabs (Losartan potassium) .... Take one daily 9)  Icaps Caps (Multiple vitamins-minerals) .... Take  1 capsule by mouth once a day 10)  Icaps Caps (Multiple vitamins-minerals) .... Take 1 tablet by mouth once a day 11)  Cefuroxime Axetil 500 Mg Tabs (Cefuroxime axetil) .... One by mouth two times a day 12)  Prednisone 10 Mg Tabs (Prednisone) .... 3 tabs by mouth once daily x 3 days, 2 tabs by mouth once daily x 3 days, 1 tab by mouth once daily x 3 days 13)  Hydrocodone-homatropine 5-1.5 Mg/24ml Syrp (Hydrocodone-homatropine) .... 5 ml by mouth at bedtime as needed for cough  Patient Instructions: 1)  Call our office if your symptoms do not  improve or gets worse. 2)  The highlighted prescriptions were electronically sent to your pharmacy 3)  Please schedule a follow-up appointment in 6 months for CPX. 4)  BMP prior to visit, ICD-9: 401.9 5)  Hepatic Panel prior to visit, ICD-9: 272.4 6)  Lipid Panel prior to visit, ICD-9: 272.4 7)  TSH prior to visit, ICD-9: 272.4 8)  Please return for lab work one (1) week before your next appointment.  Prescriptions: LOSARTAN POTASSIUM 100 MG TABS (LOSARTAN POTASSIUM) take one daily  #90 x 1   Entered and Authorized by:   D. Thomos Lemons DO   Signed by:   D. Thomos Lemons DO on 02/06/2010   Method used:   Electronically to        [facility]* (retail)             ,          Ph: [phone]       Fax: [phone]   RxID:   [id number] AMLODIPINE BESYLATE 10 MG TABS (AMLODIPINE BESYLATE) take one daily  #90 x 1   Entered and Authorized by:   D. Thomos Lemons DO   Signed by:   D. Thomos Lemons DO on 02/06/2010   Method used:   Electronically to        [facility]* (retail)             ,          Ph: [phone]       Fax: [phone]   RxID:   [id number] HYDROCODONE-HOMATROPINE 5-1.5 MG/5ML SYRP (HYDROCODONE-HOMATROPINE) 5 ml by mouth at bedtime as needed for cough  #60 ml x 0   Entered and Authorized by:   D. Thomos Lemons DO   Signed by:   D. Thomos Lemons DO on 02/06/2010   Method used:   Print then Give to Patient   RxID:    [id number] PREDNISONE 10 MG TABS (PREDNISONE) 3 tabs by mouth once daily x 3 days, 2 tabs by mouth once daily x 3 days, 1 tab by mouth once daily x 3 days  #18 x 0   Entered and Authorized by:   D. Thomos Lemons DO  Signed by:   D. Thomos Lemons DO on 02/06/2010   Method used:   Electronically to        [facility] [phone]* (retail)       [address]       [place], [place]  [address]       Ph: [phone]       Fax: [phone]   RxID:   [id number] CEFUROXIME AXETIL 500 MG TABS (CEFUROXIME AXETIL) one by mouth two times a day  #20 x 0   Entered and Authorized by:   D. Thomos Lemons DO   Signed by:   D. Thomos Lemons DO on 02/06/2010   Method used:   Electronically to        [facility] [phone]* (retail)       [address]       [place], [place]  [address]       Ph: [phone]       Fax: [phone]   RxID:   [id number]    Orders Added: 1)  Est. Patient Level III [16073]     Current Allergies (reviewed today): No known allergies

## 2010-06-04 LAB — COMPREHENSIVE METABOLIC PANEL
ALT: 42 U/L (ref 0–53)
AST: 58 U/L — ABNORMAL HIGH (ref 0–37)
Albumin: 3.7 g/dL (ref 3.5–5.2)
Alkaline Phosphatase: 69 U/L (ref 39–117)
Potassium: 3.9 mEq/L (ref 3.5–5.1)
Sodium: 140 mEq/L (ref 135–145)
Total Protein: 6.9 g/dL (ref 6.0–8.3)

## 2010-06-04 LAB — CARDIAC PANEL(CRET KIN+CKTOT+MB+TROPI)
CK, MB: 24 ng/mL (ref 0.3–4.0)
CK, MB: 48 ng/mL (ref 0.3–4.0)
Relative Index: 12.6 — ABNORMAL HIGH (ref 0.0–2.5)
Relative Index: 13.8 — ABNORMAL HIGH (ref 0.0–2.5)
Total CK: 191 U/L (ref 7–232)
Total CK: 340 U/L — ABNORMAL HIGH (ref 7–232)
Troponin I: 1.57 ng/mL (ref 0.00–0.06)
Troponin I: 4.71 ng/mL (ref 0.00–0.06)
Troponin I: 8.59 ng/mL (ref 0.00–0.06)

## 2010-06-04 LAB — DIFFERENTIAL
Basophils Relative: 1 % (ref 0–1)
Eosinophils Absolute: 0 10*3/uL (ref 0.0–0.7)
Monocytes Absolute: 0.5 10*3/uL (ref 0.1–1.0)
Monocytes Relative: 6 % (ref 3–12)
Neutro Abs: 5.5 10*3/uL (ref 1.7–7.7)

## 2010-06-04 LAB — CBC
Hemoglobin: 12.3 g/dL — ABNORMAL LOW (ref 13.0–17.0)
MCHC: 34.5 g/dL (ref 30.0–36.0)
MCHC: 34.6 g/dL (ref 30.0–36.0)
MCHC: 35.3 g/dL (ref 30.0–36.0)
MCV: 94.5 fL (ref 78.0–100.0)
Platelets: 245 10*3/uL (ref 150–400)
Platelets: 261 10*3/uL (ref 150–400)
RDW: 12 % (ref 11.5–15.5)
RDW: 12.4 % (ref 11.5–15.5)
RDW: 12.6 % (ref 11.5–15.5)
RDW: 12.8 % (ref 11.5–15.5)
WBC: 11.9 10*3/uL — ABNORMAL HIGH (ref 4.0–10.5)
WBC: 8 10*3/uL (ref 4.0–10.5)

## 2010-06-04 LAB — LIPID PANEL
LDL Cholesterol: 86 mg/dL (ref 0–99)
Total CHOL/HDL Ratio: 6.2 RATIO
VLDL: 31 mg/dL (ref 0–40)
VLDL: 40 mg/dL (ref 0–40)

## 2010-06-04 LAB — BASIC METABOLIC PANEL
CO2: 21 mEq/L (ref 19–32)
Calcium: 8.4 mg/dL (ref 8.4–10.5)
Calcium: 9 mg/dL (ref 8.4–10.5)
Creatinine, Ser: 0.86 mg/dL (ref 0.4–1.5)
GFR calc Af Amer: >60 mL/min (ref >60)
GFR calc non Af Amer: >60 mL/min (ref >60)
Glucose, Bld: 92 mg/dL (ref 70–99)
Sodium: 135 mEq/L (ref 135–145)
Sodium: 146 mEq/L — ABNORMAL HIGH (ref 135–145)

## 2010-06-04 LAB — PROTIME-INR
INR: 1.01 (ref 0.00–1.49)
Prothrombin Time: 13.6 seconds (ref 11.6–15.2)

## 2010-06-04 LAB — HEPARIN LEVEL (UNFRACTIONATED): Heparin Unfractionated: 0.76 IU/mL — ABNORMAL HIGH (ref 0.30–0.70)

## 2010-06-04 LAB — POCT CARDIAC MARKERS
Myoglobin, poc: 103 ng/mL (ref 12–200)
Troponin i, poc: <0.05 ng/mL (ref 0.00–0.09)

## 2010-06-04 LAB — HEMOGLOBIN A1C: Mean Plasma Glucose: 120 mg/dL

## 2010-06-04 LAB — APTT: aPTT: 27 seconds (ref 24–37)

## 2010-06-04 LAB — TSH: TSH: 2.453 u[IU]/mL (ref 0.350–4.500)

## 2010-08-07 ENCOUNTER — Encounter: Payer: Self-pay | Admitting: Internal Medicine

## 2010-08-08 ENCOUNTER — Telehealth: Payer: Self-pay | Admitting: Internal Medicine

## 2010-08-08 NOTE — Telephone Encounter (Signed)
Pt is seeing Dr.mcgowen on 6.8.12 for cpe. Please order labs for [facility].

## 2010-08-10 ENCOUNTER — Other Ambulatory Visit: Payer: Self-pay | Admitting: Internal Medicine

## 2010-08-10 DIAGNOSIS — I10 Essential (primary) hypertension: Secondary | ICD-10-CM

## 2010-08-10 DIAGNOSIS — E785 Hyperlipidemia, unspecified: Secondary | ICD-10-CM

## 2010-08-10 LAB — HEPATIC FUNCTION PANEL
ALT: 13 U/L (ref 0–53)
AST: 14 U/L (ref 0–37)
Bilirubin, Direct: 0.1 mg/dL (ref 0.0–0.3)
Indirect Bilirubin: 0.6 mg/dL (ref 0.0–0.9)

## 2010-08-10 LAB — BASIC METABOLIC PANEL
BUN: 13 mg/dL (ref 6–23)
Chloride: 106 mEq/L (ref 96–112)
Potassium: 4.1 mEq/L (ref 3.5–5.3)

## 2010-08-10 LAB — LIPID PANEL
LDL Cholesterol: 37 mg/dL (ref 0–99)
VLDL: 26 mg/dL (ref 0–40)

## 2010-08-10 LAB — TSH: TSH: 2.418 u[IU]/mL (ref 0.350–4.500)

## 2010-08-10 NOTE — Telephone Encounter (Signed)
Lab Orders have been entered,patient drawn 08/10/2010 at [facility]

## 2010-08-13 ENCOUNTER — Ambulatory Visit: Payer: Self-pay | Admitting: Internal Medicine

## 2010-08-17 ENCOUNTER — Ambulatory Visit (INDEPENDENT_AMBULATORY_CARE_PROVIDER_SITE_OTHER): Payer: Medicare Other | Admitting: Family Medicine

## 2010-08-17 ENCOUNTER — Encounter: Payer: Self-pay | Admitting: Family Medicine

## 2010-08-17 VITALS — BP 112/60 | HR 77 | Temp 98.5°F | Resp 18 | Ht 64.0 in | Wt 162.0 lb

## 2010-08-17 DIAGNOSIS — Z0000 Encounter for general adult medical examination without abnormal findings: Secondary | ICD-10-CM

## 2010-08-17 MED ORDER — AMLODIPINE BESYLATE 10 MG PO TABS: 10.0000 mg | ORAL_TABLET | Freq: Every day | ORAL | Status: DC

## 2010-08-17 MED ORDER — LOSARTAN POTASSIUM 100 MG PO TABS: 100.0000 mg | ORAL_TABLET | Freq: Every day | ORAL | Status: DC

## 2010-08-17 NOTE — Assessment & Plan Note (Signed)
Reviewed the current plans for the patient's chronic diagnoses.  Refilled medications as necessary.  Routine lab monitoring ordered/reviewed as appropriate.  Discussed prudent diet and regular exercise, as well as basic weight management options as appropriate.   He deferred DRE until next f/u in 41mo, at which time he'll be due for his annual PSA.  Printed 90d rx's of amlidipine and losartan today, RF x 2. He'll keep cardiology f/u appt in Dec. 2012, too.

## 2010-08-17 NOTE — Progress Notes (Signed)
Office Note 08/17/2010  CC:  Chief Complaint  Patient presents with  . Annual Exam  . Medication Refill    printed Rx for Norvasc and Cozaar    HPI:  Anthony [name] is a 65 y.o. White male who is here for CPE. Feels good, no complaints. He is very active and denies any limitations by SOB, CP, or dizziness. He's switching insurers next month and asks for printed rxs for 90 d supply of norvasc and cozaar. Last cardiologist f/u was 02/2010 and all was well, no changes made-f/u 1 yr.   Past Medical History  Diagnosis Date  . GERD (gastroesophageal reflux disease)   . Hyperlipidemia   . Hypertension     8-10 years  . Cataract   . CAD (coronary artery disease) March 2011    with a non-ST elevation myocardial infarction. cardiac catherization whic revealed-left main was normal. The LAD had proximal calcification. There was long proximal 30% stenosis. Circumflex inthe AV groove had  proximal  60% stenosis at a mid obtuse marginal. This first large mid obtuse marginal had ostial 70% stenosis. The right coronary artery had a proximal long 50% stenosis.   . Kidney stone   . External hemorrhoids     large external    Past Surgical History  Procedure Date  . Cataract extraction   . Coronary angioplasty with stent placement   . Colonoscopy 2009    [facility]    Family History  Problem Relation Age of Onset  . Coronary artery disease Father     in his 19's  . Heart disease Mother     deceased of heart disease  . Heart disease Sister      2  older sisters  . Other Neg Hx     no lung cancer, colon cancer, or prostate    History   Social History  . Marital Status: Married    Spouse Name: N/A    Number of Children: N/A  . Years of Education: N/A   Occupational History  . Not on file.   Social History Main Topics  . Smoking status: Former Games developer  . Smokeless tobacco: Not on file   Comment: 40 pack year history  . Alcohol Use: No  . Drug Use: Not on file  .  Sexually Active: Not on file   Other Topics Concern  . Not on file   Social History Narrative   married -43 yrs  (Helen Tesmer)grew up in [place] has one son - two grandaugthers  He is retired.  He had  a 40 pack-year smoking history but has discontinued.  prev occuptation -  [employer]Alcohol use-noSmoking Status:  quitPacks/Day:  0.5Caffeine use/day:  2 beverages dailyDoes Patient Exercise:  yes    Outpatient Prescriptions Prior to Visit  Medication Sig Dispense Refill  . aspirin 81 MG tablet Take 81 mg by mouth daily.        . clopidogrel (PLAVIX) 75 MG tablet Take 75 mg by mouth daily.        . metoprolol tartrate (LOPRESSOR) 25 MG tablet Take 25 mg by mouth. 1/2 tablet by mouth twice a day       . Multiple Vitamin (MULTIVITAMIN) tablet Take 1 tablet by mouth daily.        . nitroGLYCERIN (NITROSTAT) 0.4 MG SL tablet Place 0.4 mg under the tongue every 5 (five) minutes as needed.        . rosuvastatin (CRESTOR) 40 MG tablet Take 40 mg by mouth  daily.        . amLODipine (NORVASC) 10 MG tablet Take 10 mg by mouth daily.        Marland Kitchen losartan (COZAAR) 100 MG tablet Take 100 mg by mouth daily.        . Multiple Vitamins-Minerals (ICAPS PLUS PO) Take 1 capsule by mouth daily.          No Known Allergies  ROS Review of Systems  Constitutional: Negative for fever, chills, appetite change and fatigue.  HENT: Negative for ear pain, congestion, sore throat, neck stiffness and dental problem.   Eyes: Negative for discharge, redness and visual disturbance.  Respiratory: Negative for cough, chest tightness, shortness of breath and wheezing.   Cardiovascular: Negative for chest pain, palpitations and leg swelling.  Gastrointestinal: Negative for nausea, vomiting, abdominal pain, diarrhea and blood in stool.  Genitourinary: Negative for dysuria, urgency, frequency, hematuria, flank pain and difficulty urinating.  Musculoskeletal: Negative for myalgias, back pain, joint swelling  and arthralgias.  Skin: Negative for pallor and rash.  Neurological: Negative for dizziness, speech difficulty, weakness and headaches.  Hematological: Negative for adenopathy. Does not bruise/bleed easily.  Psychiatric/Behavioral: Negative for confusion and sleep disturbance. The patient is not nervous/anxious.     PE; Blood pressure 112/60, pulse 77, temperature 98.5 F (36.9 C), temperature source Oral, resp. rate 18, height 5\' 4"  (1.626 m), weight 162 lb (73.483 kg), SpO2 98.00%. Gen: Alert, well appearing.  Patient is oriented to person, place, time, and situation. HEENT: Scalp without lesions or hair loss.  Ears: EACs clear, normal epithelium.  TMs with good light reflex and landmarks bilaterally.  Eyes: no injection, icteris, swelling, or exudate.  EOMI, PERRLA. Nose: no drainage or turbinate edema/swelling.  No injection or focal lesion.  Mouth: lips without lesion/swelling.  Oral mucosa pink and moist.  Dentition intact and without obvious caries or gingival swelling.  Oropharynx without erythema, exudate, or swelling.  Neck: supple, ROM full.  Carotids 2+ bilat, without bruit.  No lymphadenopathy, thyromegaly, or mass. Chest: symmetric expansion, nonlabored respirations.  Clear and equal breath sounds in all lung fields.   CV: RRR, no m/r/g.  Peripheral pulses 2+ and symmetric. ABD: soft, NT, ND, BS normal.  No hepatospenomegaly or mass.  No bruits. EXT: trace pitting bilat LE edema in ankles.  Pertinent labs:  Reviewed recent labs from 08/10/10: CMET, TSH, and lipids all good.    ASSESSMENT AND PLAN:   Health maintenance examination Reviewed the current plans for the patient's chronic diagnoses.  Refilled medications as necessary.  Routine lab monitoring ordered/reviewed as appropriate.  Discussed prudent diet and regular exercise, as well as basic weight management options as appropriate.   He deferred DRE until next f/u in 28mo, at which time he'll be due for his annual PSA.   Printed 90d rx's of amlidipine and losartan today, RF x 2. He'll keep cardiology f/u appt in Dec. 2012, too.     FOLLOW UP:  Return in about 6 months (around 02/16/2011) for F/u HTN and get DRE/PSA.

## 2010-09-25 ENCOUNTER — Telehealth: Payer: Self-pay | Admitting: Cardiology

## 2010-09-25 MED ORDER — METOPROLOL TARTRATE 25 MG PO TABS: ORAL_TABLET | ORAL | Status: DC

## 2010-09-25 MED ORDER — CLOPIDOGREL BISULFATE 75 MG PO TABS: 75.0000 mg | ORAL_TABLET | Freq: Every day | ORAL | Status: DC

## 2010-09-25 MED ORDER — ROSUVASTATIN CALCIUM 40 MG PO TABS: 40.0000 mg | ORAL_TABLET | Freq: Every day | ORAL | Status: DC

## 2010-09-25 NOTE — Telephone Encounter (Signed)
Spoke with pt, meds confirmed and scripts mailed to pts home address [name]

## 2010-09-25 NOTE — Telephone Encounter (Addendum)
Pt needs written prescription of crestor and plavix and metoprolol mail to his home. Pt wants 90 days supply

## 2010-12-07 ENCOUNTER — Ambulatory Visit (INDEPENDENT_AMBULATORY_CARE_PROVIDER_SITE_OTHER): Payer: Medicare Other | Admitting: Family

## 2010-12-07 DIAGNOSIS — Z23 Encounter for immunization: Secondary | ICD-10-CM

## 2011-02-11 ENCOUNTER — Ambulatory Visit: Payer: Commercial Managed Care - PPO | Admitting: Internal Medicine

## 2011-02-11 ENCOUNTER — Ambulatory Visit: Payer: Medicare Other | Admitting: Internal Medicine

## 2011-02-12 ENCOUNTER — Telehealth: Payer: Self-pay | Admitting: Internal Medicine

## 2011-02-12 ENCOUNTER — Ambulatory Visit (INDEPENDENT_AMBULATORY_CARE_PROVIDER_SITE_OTHER): Payer: Medicare Other | Admitting: Internal Medicine

## 2011-02-12 ENCOUNTER — Encounter: Payer: Self-pay | Admitting: Internal Medicine

## 2011-02-12 ENCOUNTER — Encounter: Payer: Self-pay | Admitting: Cardiology

## 2011-02-12 VITALS — BP 108/72 | HR 69 | Temp 98.0°F | Resp 18 | Wt 167.0 lb

## 2011-02-12 DIAGNOSIS — I10 Essential (primary) hypertension: Secondary | ICD-10-CM

## 2011-02-12 DIAGNOSIS — N138 Other obstructive and reflux uropathy: Secondary | ICD-10-CM

## 2011-02-12 DIAGNOSIS — Z79899 Other long term (current) drug therapy: Secondary | ICD-10-CM

## 2011-02-12 DIAGNOSIS — E785 Hyperlipidemia, unspecified: Secondary | ICD-10-CM

## 2011-02-12 DIAGNOSIS — N139 Obstructive and reflux uropathy, unspecified: Secondary | ICD-10-CM

## 2011-02-12 DIAGNOSIS — N401 Enlarged prostate with lower urinary tract symptoms: Secondary | ICD-10-CM

## 2011-02-12 MED ORDER — AMLODIPINE BESYLATE 10 MG PO TABS: 10.0000 mg | ORAL_TABLET | Freq: Every day | ORAL | Status: DC

## 2011-02-12 MED ORDER — LOSARTAN POTASSIUM 100 MG PO TABS: 100.0000 mg | ORAL_TABLET | Freq: Every day | ORAL | Status: DC

## 2011-02-12 NOTE — Patient Instructions (Signed)
Please schedule cbc, chem7 v58.69, lipid/lft 272.4 and psa (BPH with ob) for tomorrow morning fasting Also please schedule chem7 v58.69 and lipid/lft 272.4 prior to next appointment

## 2011-02-12 NOTE — Progress Notes (Signed)
  Subjective:    Patient ID: Anthony [name], male    DOB: December 10, 1945, 65 y.o.   MRN: [id number]  HPI Pt presents to clinic for followup of multiple medical problems. Tolerates statin tx without myalgias or abn lft. H/o CAD asx and followed by cardiology. BP reviewed nl. Believes pneumovax ~2y ago.   Past Medical History  Diagnosis Date  . GERD (gastroesophageal reflux disease)   . Hyperlipidemia   . Hypertension     8-10 years  . Cataract   . CAD (coronary artery disease) March 2011    with a non-ST elevation myocardial infarction. cardiac catherization whic revealed-left main was normal. The LAD had proximal calcification. There was long proximal 30% stenosis. Circumflex inthe AV groove had  proximal  60% stenosis at a mid obtuse marginal. This first large mid obtuse marginal had ostial 70% stenosis. The right coronary artery had a proximal long 50% stenosis.   . Kidney stone   . External hemorrhoids     large external   Past Surgical History  Procedure Date  . Cataract extraction   . Coronary angioplasty with stent placement   . Colonoscopy 2009    [facility]    reports that he has quit smoking. He has never used smokeless tobacco. He reports that he does not drink alcohol. His drug history not on file. family history includes Coronary artery disease in his father and Heart disease in his mother and sister.  There is no history of Other. No Known Allergies   Review of Systems see hpi     Objective:   Physical Exam        Assessment & Plan:

## 2011-02-12 NOTE — Telephone Encounter (Signed)
Lab orders entered for June 2013. 

## 2011-02-13 ENCOUNTER — Encounter: Payer: Self-pay | Admitting: Cardiology

## 2011-02-13 ENCOUNTER — Ambulatory Visit (INDEPENDENT_AMBULATORY_CARE_PROVIDER_SITE_OTHER): Payer: Medicare Other | Admitting: Cardiology

## 2011-02-13 VITALS — BP 132/70 | HR 62 | Ht 65.0 in | Wt 165.0 lb

## 2011-02-13 DIAGNOSIS — I2510 Atherosclerotic heart disease of native coronary artery without angina pectoris: Secondary | ICD-10-CM

## 2011-02-13 LAB — BASIC METABOLIC PANEL
BUN: 15 mg/dL (ref 6–23)
Calcium: 9.3 mg/dL (ref 8.4–10.5)
Chloride: 103 mEq/L (ref 96–112)
Creat: 0.87 mg/dL (ref 0.50–1.35)

## 2011-02-13 LAB — CBC
HCT: 42.6 % (ref 39.0–52.0)
MCH: 31.2 pg (ref 26.0–34.0)
MCHC: 32.9 g/dL (ref 30.0–36.0)
MCV: 94.9 fL (ref 78.0–100.0)
Platelets: 230 10*3/uL (ref 150–400)
RDW: 12.9 % (ref 11.5–15.5)
WBC: 7.4 10*3/uL (ref 4.0–10.5)

## 2011-02-13 LAB — HEPATIC FUNCTION PANEL
ALT: 16 U/L (ref 0–53)
AST: 16 U/L (ref 0–37)
Albumin: 4.6 g/dL (ref 3.5–5.2)
Alkaline Phosphatase: 76 U/L (ref 39–117)
Indirect Bilirubin: 0.5 mg/dL (ref 0.0–0.9)
Total Protein: 7 g/dL (ref 6.0–8.3)

## 2011-02-13 LAB — LIPID PANEL
Cholesterol: 103 mg/dL (ref 0–200)
HDL: 35 mg/dL — ABNORMAL LOW (ref >39)
Triglycerides: 179 mg/dL — ABNORMAL HIGH (ref <150)

## 2011-02-13 MED ORDER — ROSUVASTATIN CALCIUM 40 MG PO TABS: 40.0000 mg | ORAL_TABLET | Freq: Every day | ORAL | Status: DC

## 2011-02-13 MED ORDER — METOPROLOL TARTRATE 25 MG PO TABS: ORAL_TABLET | ORAL | Status: DC

## 2011-02-13 NOTE — Assessment & Plan Note (Signed)
Continue statin. Lipids and liver monitored by primary care. 

## 2011-02-13 NOTE — Progress Notes (Signed)
VHQ:IONGEXBM gentleman admitted in March 2011 with a non-ST elevation myocardial infarction. He underwent cardiac catheterization which revealed - Left main was normal.  The LAD had proximal calcification. There was long proximal 30% stenosis.  Circumflex in the AV groove had proximal 60% stenosis at a mid obtuse marginal.  This first large mid obtuse marginal had ostial 70% stenosis.  The right coronary artery had a proximal long 50% stenosis.  There was mid subtotal stenosis.  The EF was 50% with inferior hypokinesis. Patient had a drug-eluting stent to the right coronary artery at that time. Abdominal ultrasound in July of 2011 showed no aneurysm. I last saw him in Dec of 2011. Since then the patient denies any dyspnea on exertion, orthopnea, PND, pedal edema, palpitations, syncope or chest pain. He is exercising routinely with no symptoms.  Current Outpatient Prescriptions  Medication Sig Dispense Refill  . amLODipine (NORVASC) 10 MG tablet Take 1 tablet (10 mg total) by mouth daily.  90 tablet  3  . aspirin 81 MG tablet Take 81 mg by mouth daily.        . clopidogrel (PLAVIX) 75 MG tablet Take 1 tablet (75 mg total) by mouth daily.  90 tablet  4  . losartan (COZAAR) 100 MG tablet Take 1 tablet (100 mg total) by mouth daily.  90 tablet  3  . metoprolol tartrate (LOPRESSOR) 25 MG tablet 1/2 tablet by mouth twice a day  90 tablet  4  . Multiple Vitamin (MULTIVITAMIN) tablet Take 1 tablet by mouth daily.        . Multiple Vitamins-Minerals (ICAPS PLUS PO) Take 1 capsule by mouth daily.        . nitroGLYCERIN (NITROSTAT) 0.4 MG SL tablet Place 0.4 mg under the tongue every 5 (five) minutes as needed.        . rosuvastatin (CRESTOR) 40 MG tablet Take 1 tablet (40 mg total) by mouth daily.  90 tablet  4     Past Medical History  Diagnosis Date  . GERD (gastroesophageal reflux disease)   . Hyperlipidemia   . Hypertension     8-10 years  . Cataract   . CAD (coronary artery disease) March 2011   with a non-ST elevation myocardial infarction. cardiac catherization whic revealed-left main was normal. The LAD had proximal calcification. There was long proximal 30% stenosis. Circumflex inthe AV groove had  proximal  60% stenosis at a mid obtuse marginal. This first large mid obtuse marginal had ostial 70% stenosis. The right coronary artery had a proximal long 50% stenosis.   . Kidney stone   . External hemorrhoids     large external    Past Surgical History  Procedure Date  . Cataract extraction   . Coronary angioplasty with stent placement   . Colonoscopy 2009    [facility]    History   Social History  . Marital Status: Married    Spouse Name: N/A    Number of Children: N/A  . Years of Education: N/A   Occupational History  . Not on file.   Social History Main Topics  . Smoking status: Former Games developer  . Smokeless tobacco: Never Used   Comment: 40 pack year history  . Alcohol Use: No  . Drug Use: Not on file  . Sexually Active: Not on file   Other Topics Concern  . Not on file   Social History Narrative   married -43 yrs  (Helen Haliburton)grew up in [place] has one son - two  grandaugthers  He is retired.  He had  a 40 pack-year smoking history but has discontinued.  prev occuptation -  [employer]Alcohol use-noSmoking Status:  quitPacks/Day:  0.5Caffeine use/day:  2 beverages dailyDoes Patient Exercise:  yes    ROS: no fevers or chills, productive cough, hemoptysis, dysphasia, odynophagia, melena, hematochezia, dysuria, hematuria, rash, seizure activity, orthopnea, PND, pedal edema, claudication. Remaining systems are negative.  Physical Exam: Well-developed well-nourished in no acute distress.  Skin is warm and dry.  HEENT is normal.  Neck is supple. No thyromegaly.  Chest is clear to auscultation with normal expansion.  Cardiovascular exam is regular rate and rhythm.  Abdominal exam nontender or distended. No masses palpated. Extremities show no  edema. neuro grossly intact  ECG NSR with no ST changes

## 2011-02-13 NOTE — Assessment & Plan Note (Signed)
Blood pressure controlled. Continue present medications. Potassium and renal function monitored by primary care. 

## 2011-02-13 NOTE — Patient Instructions (Signed)
Your physician wants you to follow-up in: one year You will receive a reminder letter in the mail two months in advance. If you don't receive a letter, please call our office to schedule the follow-up appointment.   Stop plavix

## 2011-02-13 NOTE — Assessment & Plan Note (Signed)
Continue aspirin and statin. Discontinue Plavix. When he returns in one year plan Myoview for risk stratification. Continue risk factor modification.

## 2011-02-16 NOTE — Assessment & Plan Note (Signed)
Obtain psa 

## 2011-02-16 NOTE — Assessment & Plan Note (Signed)
Obtain lipid/lft. 

## 2011-02-16 NOTE — Assessment & Plan Note (Signed)
Normotensive and stable. Continue current regimen. Monitor bp as outpt and followup in clinic as scheduled. Obtain cbc and chem7 

## 2011-03-13 ENCOUNTER — Telehealth: Payer: Self-pay | Admitting: *Deleted

## 2011-03-13 NOTE — Telephone Encounter (Signed)
Received message from pt stating he is having problems with hemorrhoids. Spoke to pt and he states he has been doing a lot of physical work and has started having some rectal bleeding with bowel movements. States he has a history of hemorrhoids and saw Dr Cherylann Parr at [facility] about 3 yrs ago. Advised pt that Dr Rodena Medin is out of the office today and to contact Dr Cherylann Parr. Pt declines and states he would prefer to see Dr Rodena Medin first. Scheduled pt appt for tomorrow at 2:30pm.

## 2011-03-14 ENCOUNTER — Ambulatory Visit: Payer: Medicare Other | Admitting: Internal Medicine

## 2011-03-15 DIAGNOSIS — K644 Residual hemorrhoidal skin tags: Secondary | ICD-10-CM | POA: Diagnosis not present

## 2011-05-28 ENCOUNTER — Encounter: Payer: Self-pay | Admitting: Internal Medicine

## 2011-05-28 ENCOUNTER — Ambulatory Visit (INDEPENDENT_AMBULATORY_CARE_PROVIDER_SITE_OTHER): Payer: Medicare Other | Admitting: Internal Medicine

## 2011-05-28 VITALS — BP 102/60 | HR 70 | Temp 97.8°F | Resp 16 | Ht 64.0 in | Wt 166.0 lb

## 2011-05-28 DIAGNOSIS — J310 Chronic rhinitis: Secondary | ICD-10-CM

## 2011-05-28 MED ORDER — BECLOMETHASONE DIPROPIONATE 80 MCG/ACT NA AERS: 2.0000 | INHALATION_SPRAY | Freq: Every day | NASAL | Status: DC

## 2011-05-28 MED ORDER — DOXYCYCLINE HYCLATE 100 MG PO TABS: 100.0000 mg | ORAL_TABLET | Freq: Two times a day (BID) | ORAL | Status: AC

## 2011-05-28 NOTE — Assessment & Plan Note (Signed)
With possible sinus involvement. Attempt po course of abx with sample of qnasl. Followup if no improvement or worsening.

## 2011-05-28 NOTE — Progress Notes (Signed)
  Subjective:    Patient ID: Anthony [name], male    DOB: 1945/12/10, 66 y.o.   MRN: [id number]  HPI Pt presents to clinic for evaluation of nasal drainage. Notes ~40month h/o head congestion, clear nasal drainage and now ears feel congested. No f/c. Has NP cough but denies dyspnea or wheezing. Taking cough drops without improvement. No other alleviating or exacerbating factors.   Past Medical History  Diagnosis Date  . GERD (gastroesophageal reflux disease)   . Hyperlipidemia   . Hypertension     8-10 years  . Cataract   . CAD (coronary artery disease) March 2011    with a non-ST elevation myocardial infarction. cardiac catherization whic revealed-left main was normal. The LAD had proximal calcification. There was long proximal 30% stenosis. Circumflex inthe AV groove had  proximal  60% stenosis at a mid obtuse marginal. This first large mid obtuse marginal had ostial 70% stenosis. The right coronary artery had a proximal long 50% stenosis.   . Kidney stone   . External hemorrhoids     large external   Past Surgical History  Procedure Date  . Cataract extraction   . Coronary angioplasty with stent placement   . Colonoscopy 2009    [facility]    reports that he has quit smoking. He has never used smokeless tobacco. He reports that he does not drink alcohol. His drug history not on file. family history includes Coronary artery disease in his father and Heart disease in his mother and sister.  There is no history of Other. No Known Allergies     Review of Systems see hpi     Objective:   Physical Exam  Nursing note and vitals reviewed. Constitutional: He appears well-developed and well-nourished. No distress.  HENT:  Head: Normocephalic and atraumatic.  Right Ear: External ear normal.  Left Ear: External ear normal.  Nose: Mucosal edema and rhinorrhea present.  Mouth/Throat: Uvula is midline, oropharynx is clear and moist and mucous membranes are normal. No oropharyngeal  exudate, posterior oropharyngeal edema, posterior oropharyngeal erythema or tonsillar abscesses.       Bilateral cerumen. Visualized tm nl  Eyes: Conjunctivae are normal. Right eye exhibits no discharge. Left eye exhibits no discharge. No scleral icterus.  Neck: Neck supple.  Pulmonary/Chest: Effort normal and breath sounds normal. No respiratory distress. He has no wheezes. He has no rales.  Lymphadenopathy:    He has no cervical adenopathy.  Neurological: He is alert.  Skin: He is not diaphoretic.  Psychiatric: He has a normal mood and affect.          Assessment & Plan:

## 2011-06-24 DIAGNOSIS — H35039 Hypertensive retinopathy, unspecified eye: Secondary | ICD-10-CM | POA: Diagnosis not present

## 2011-06-24 DIAGNOSIS — H00029 Hordeolum internum unspecified eye, unspecified eyelid: Secondary | ICD-10-CM | POA: Diagnosis not present

## 2011-06-24 DIAGNOSIS — D3130 Benign neoplasm of unspecified choroid: Secondary | ICD-10-CM | POA: Diagnosis not present

## 2011-06-24 DIAGNOSIS — H40019 Open angle with borderline findings, low risk, unspecified eye: Secondary | ICD-10-CM | POA: Diagnosis not present

## 2011-06-24 DIAGNOSIS — H353190 Nonexudative age-related macular degeneration, unspecified eye, stage unspecified: Secondary | ICD-10-CM | POA: Diagnosis not present

## 2011-06-24 DIAGNOSIS — Z961 Presence of intraocular lens: Secondary | ICD-10-CM | POA: Diagnosis not present

## 2011-07-02 DIAGNOSIS — H30149 Acute posterior multifocal placoid pigment epitheliopathy, unspecified eye: Secondary | ICD-10-CM | POA: Diagnosis not present

## 2011-07-02 DIAGNOSIS — H353190 Nonexudative age-related macular degeneration, unspecified eye, stage unspecified: Secondary | ICD-10-CM | POA: Diagnosis not present

## 2011-08-12 ENCOUNTER — Ambulatory Visit (INDEPENDENT_AMBULATORY_CARE_PROVIDER_SITE_OTHER): Payer: Medicare Other | Admitting: Internal Medicine

## 2011-08-12 ENCOUNTER — Encounter: Payer: Self-pay | Admitting: Internal Medicine

## 2011-08-12 VITALS — BP 122/72 | HR 58 | Temp 97.6°F | Resp 16 | Wt 166.0 lb

## 2011-08-12 DIAGNOSIS — J310 Chronic rhinitis: Secondary | ICD-10-CM

## 2011-08-12 DIAGNOSIS — Z79899 Other long term (current) drug therapy: Secondary | ICD-10-CM | POA: Diagnosis not present

## 2011-08-12 DIAGNOSIS — E785 Hyperlipidemia, unspecified: Secondary | ICD-10-CM

## 2011-08-12 DIAGNOSIS — I10 Essential (primary) hypertension: Secondary | ICD-10-CM | POA: Diagnosis not present

## 2011-08-12 LAB — HEPATIC FUNCTION PANEL
ALT: 15 U/L (ref 0–53)
AST: 16 U/L (ref 0–37)
Alkaline Phosphatase: 66 U/L (ref 39–117)
Bilirubin, Direct: 0.2 mg/dL (ref 0.0–0.3)
Indirect Bilirubin: 0.5 mg/dL (ref 0.0–0.9)
Total Bilirubin: 0.7 mg/dL (ref 0.3–1.2)

## 2011-08-12 LAB — LIPID PANEL
Cholesterol: 105 mg/dL (ref 0–200)
Total CHOL/HDL Ratio: 2.9 Ratio

## 2011-08-12 LAB — BASIC METABOLIC PANEL
BUN: 15 mg/dL (ref 6–23)
CO2: 27 mEq/L (ref 19–32)
Calcium: 9.1 mg/dL (ref 8.4–10.5)
Creat: 0.82 mg/dL (ref 0.50–1.35)
Glucose, Bld: 86 mg/dL (ref 70–99)
Sodium: 140 mEq/L (ref 135–145)

## 2011-08-12 NOTE — Patient Instructions (Signed)
If you want schedule your next visit for an annual Wellness. If you do not want this the next visit will be a 6 month follow up. Please schedule fasting labs prior to next visit Cbc-401.9, chem7-v58.69 and lipid/lft 272.4

## 2011-08-12 NOTE — Progress Notes (Signed)
  Subjective:    Patient ID: Anthony [name], male    DOB: 1945-05-13, 66 y.o.   MRN: [id number]  HPI Pt presents to clinic for followup of multiple medical problems. bp reviewed normotensive. Tolerating statin tx. Has suffered from chronic rhinitis- last visit attempted nasonex and sx's resolved. Not currently taking the medication.   Past Medical History  Diagnosis Date  . GERD (gastroesophageal reflux disease)   . Hyperlipidemia   . Hypertension     8-10 years  . Cataract   . CAD (coronary artery disease) March 2011    with a non-ST elevation myocardial infarction. cardiac catherization whic revealed-left main was normal. The LAD had proximal calcification. There was long proximal 30% stenosis. Circumflex inthe AV groove had  proximal  60% stenosis at a mid obtuse marginal. This first large mid obtuse marginal had ostial 70% stenosis. The right coronary artery had a proximal long 50% stenosis.   . Kidney stone   . External hemorrhoids     large external   Past Surgical History  Procedure Date  . Cataract extraction   . Coronary angioplasty with stent placement   . Colonoscopy 2009    [facility]    reports that he has quit smoking. He has never used smokeless tobacco. He reports that he does not drink alcohol. His drug history not on file. family history includes Coronary artery disease in his father and Heart disease in his mother and sister.  There is no history of Other. No Known Allergies    Review of Systems see hpi     Objective:   Physical Exam  Physical Exam  Nursing note and vitals reviewed. Constitutional: Appears well-developed and well-nourished. No distress.  HENT:  Head: Normocephalic and atraumatic.  Right Ear: External ear normal.  Left Ear: External ear normal.  Eyes: Conjunctivae are normal. No scleral icterus.  Neck: Neck supple. Carotid bruit is not present.  Cardiovascular: Normal rate, regular rhythm and normal heart sounds.  Exam reveals no  gallop and no friction rub.   No murmur heard. Pulmonary/Chest: Effort normal and breath sounds normal. No respiratory distress. He has no wheezes. no rales.  Lymphadenopathy:    He has no cervical adenopathy.  Neurological:Alert.  Skin: Skin is warm and dry. Not diaphoretic.  Psychiatric: Has a normal mood and affect.        Assessment & Plan:

## 2011-08-12 NOTE — Assessment & Plan Note (Signed)
Obtain lipid/lft. 

## 2011-08-12 NOTE — Assessment & Plan Note (Signed)
Resolved

## 2011-08-12 NOTE — Assessment & Plan Note (Signed)
Normotensive and stable. Continue current regimen. Monitor bp as outpt and followup in clinic as scheduled. Obtain chem7 

## 2011-08-26 DIAGNOSIS — H40019 Open angle with borderline findings, low risk, unspecified eye: Secondary | ICD-10-CM | POA: Diagnosis not present

## 2011-08-26 DIAGNOSIS — H04129 Dry eye syndrome of unspecified lacrimal gland: Secondary | ICD-10-CM | POA: Diagnosis not present

## 2011-11-09 ENCOUNTER — Encounter (HOSPITAL_BASED_OUTPATIENT_CLINIC_OR_DEPARTMENT_OTHER): Payer: Self-pay | Admitting: *Deleted

## 2011-11-09 ENCOUNTER — Emergency Department (HOSPITAL_BASED_OUTPATIENT_CLINIC_OR_DEPARTMENT_OTHER)
Admission: EM | Admit: 2011-11-09 | Discharge: 2011-11-09 | Disposition: A | Discharge status: Home / Self Care | Payer: Medicare Other | Attending: Emergency Medicine | Admitting: Emergency Medicine

## 2011-11-09 DIAGNOSIS — Z87442 Personal history of urinary calculi: Secondary | ICD-10-CM | POA: Insufficient documentation

## 2011-11-09 DIAGNOSIS — E785 Hyperlipidemia, unspecified: Secondary | ICD-10-CM | POA: Insufficient documentation

## 2011-11-09 DIAGNOSIS — I2510 Atherosclerotic heart disease of native coronary artery without angina pectoris: Secondary | ICD-10-CM | POA: Insufficient documentation

## 2011-11-09 DIAGNOSIS — R319 Hematuria, unspecified: Secondary | ICD-10-CM | POA: Diagnosis not present

## 2011-11-09 DIAGNOSIS — N390 Urinary tract infection, site not specified: Secondary | ICD-10-CM | POA: Diagnosis not present

## 2011-11-09 DIAGNOSIS — I10 Essential (primary) hypertension: Secondary | ICD-10-CM | POA: Insufficient documentation

## 2011-11-09 DIAGNOSIS — K219 Gastro-esophageal reflux disease without esophagitis: Secondary | ICD-10-CM | POA: Insufficient documentation

## 2011-11-09 LAB — URINALYSIS, ROUTINE W REFLEX MICROSCOPIC
Bilirubin Urine: NEGATIVE
Ketones, ur: NEGATIVE mg/dL
Leukocytes, UA: NEGATIVE
Nitrite: NEGATIVE
Protein, ur: NEGATIVE mg/dL
Urobilinogen, UA: 1 mg/dL (ref 0.0–1.0)
pH: 7 (ref 5.0–8.0)

## 2011-11-09 LAB — URINE MICROSCOPIC-ADD ON

## 2011-11-09 MED ORDER — CEPHALEXIN 500 MG PO CAPS: 500.0000 mg | ORAL_CAPSULE | Freq: Four times a day (QID) | ORAL | Status: AC

## 2011-11-09 MED ORDER — CEPHALEXIN 250 MG PO CAPS
500.0000 mg | ORAL_CAPSULE | Freq: Once | ORAL | Status: AC
  Administered 2011-11-09: 500 mg via ORAL
  Filled 2011-11-09: qty 2

## 2011-11-09 NOTE — ED Notes (Signed)
Pt. C/o left flank pain that started this morning. States hx of kidney stone in the past. Pt. Does have hematuria on exam, but denies any pain at present. Denies burning with urination and denies frequency.

## 2011-11-10 NOTE — ED Provider Notes (Signed)
History     CSN: [id number]  Arrival date & time 11/09/11  1308   First MD Initiated Contact with Patient 11/09/11 2017      Chief Complaint  Patient presents with  . Hematuria    (Consider location/radiation/quality/duration/timing/severity/associated sxs/prior treatment) HPI Patient is a 66 yo male with history of UTI and nephrolithiasis who presents reporting hematuria that began today.  He had some left lower back pain that was mild and then moved to the LLQ and has now resolved.  Patient denies dysuria and reports that his urine appears to be clearing since urinating here.  At worst his pain was a crampy 3/10 and now he is pain free.  Patient denies fever, nausea, or vomiting.  He has no history of surgical intervention for stones or renal insufficiency.  Patient denies recent weight changes, fevers, or other concerning symptoms.  There are no other associated or modifying factors.  Past Medical History  Diagnosis Date  . GERD (gastroesophageal reflux disease)   . Hyperlipidemia   . Hypertension     8-10 years  . Cataract   . CAD (coronary artery disease) March 2011    with a non-ST elevation myocardial infarction. cardiac catherization whic revealed-left main was normal. The LAD had proximal calcification. There was long proximal 30% stenosis. Circumflex inthe AV groove had  proximal  60% stenosis at a mid obtuse marginal. This first large mid obtuse marginal had ostial 70% stenosis. The right coronary artery had a proximal long 50% stenosis.   . Kidney stone   . External hemorrhoids     large external    Past Surgical History  Procedure Date  . Cataract extraction   . Coronary angioplasty with stent placement   . Colonoscopy 2009    [facility]    Family History  Problem Relation Age of Onset  . Coronary artery disease Father     in his 30's  . Heart disease Mother     deceased of heart disease  . Heart disease Sister      2  older sisters  . Other Neg Hx    no lung cancer, colon cancer, or prostate    History  Substance Use Topics  . Smoking status: Former Games developer  . Smokeless tobacco: Never Used   Comment: 40 pack year history  . Alcohol Use: No      Review of Systems  Constitutional: Negative.   HENT: Negative.   Eyes: Negative.   Respiratory: Negative.   Cardiovascular: Negative.   Gastrointestinal: Positive for abdominal pain.  Genitourinary: Positive for hematuria.  Musculoskeletal: Positive for back pain.  Skin: Negative.   Neurological: Negative.   Hematological: Negative.   Psychiatric/Behavioral: Negative.   All other systems reviewed and are negative.    Allergies  Review of patient's allergies indicates no known allergies.  Home Medications   Current Outpatient Rx  Name Route Sig Dispense Refill  . AMLODIPINE BESYLATE 10 MG PO TABS Oral Take 1 tablet (10 mg total) by mouth daily. 90 tablet 3  . ASPIRIN 81 MG PO TABS Oral Take 81 mg by mouth daily.     Marland Kitchen LOSARTAN POTASSIUM 100 MG PO TABS Oral Take 1 tablet (100 mg total) by mouth daily. 90 tablet 3  . METOPROLOL TARTRATE 25 MG PO TABS Oral Take 25 mg by mouth 2 (two) times daily. 1/2 tablet by mouth twice a day    . ONE-DAILY MULTI VITAMINS PO TABS Oral Take 1 tablet by mouth daily.      Marland Kitchen  ROSUVASTATIN CALCIUM 40 MG PO TABS Oral Take 1 tablet (40 mg total) by mouth daily. 90 tablet 4  . CEPHALEXIN 500 MG PO CAPS Oral Take 1 capsule (500 mg total) by mouth 4 (four) times daily. 40 capsule 0  . NITROGLYCERIN 0.4 MG SL SUBL Sublingual Place 0.4 mg under the tongue every 5 (five) minutes as needed.        BP 161/69  Pulse 84  Temp 98 F (36.7 C)  Resp 24  Ht 5\' 5"  (1.651 m)  Wt 158 lb (71.668 kg)  BMI 26.29 kg/m2  SpO2 98%  Physical Exam  Nursing note and vitals reviewed. GEN: Well-developed, well-nourished male in no distress HEENT: Atraumatic, normocephalic. Oropharynx clear without erythema EYES: PERRLA BL, no scleral icterus. NECK: Trachea midline,  no meningismus CV: regular rate and rhythm. No murmurs, rubs, or gallops PULM: No respiratory distress.  No crackles, wheezes, or rales. GI: soft, non-tender. No guarding, rebound, or tenderness. + bowel sounds  GU: deferred Neuro: cranial nerves grossly 2-12 intact, no abnormalities of strength or sensation, A and O x 3 MSK: Patient moves all 4 extremities symmetrically, no deformity, edema, or injury noted Skin: No rashes petechiae, purpura, or jaundice Psych: no abnormality of mood   ED Course  Procedures (including critical care time)  Labs Reviewed  URINALYSIS, ROUTINE W REFLEX MICROSCOPIC - Abnormal; Notable for the following:    APPearance CLOUDY (*)     Glucose, UA 500 (*)     Hgb urine dipstick LARGE (*)     All other components within normal limits  URINE MICROSCOPIC-ADD ON - Abnormal; Notable for the following:    Bacteria, UA MANY (*)     All other components within normal limits  URINE CULTURE   No results found.   1. Hematuria   2. UTI (urinary tract infection)       MDM  Patient presented with hematuria for < 6 hours that was resolving spontaneously.  While patient had had some abdominal and back pain this had been mild and had resolved prior to my eval.  He was hemodynamically stable and asymptomatic.  Patient is not on blood thinners.  Urine was remarkable for many bacteria and RBCs though LE and nitrite negative.  Patient preferred to defer additional work-up as symptoms had resolved.  We discussed that it is very important for him to follow-up with his PCP regarding this complaint.  Patient and I agreed that CT and lab testing was not emergent at this time.  Urine culture was sent.  Given the risk of leaving an early UTI untreated a culture was sent and patient was started on keflex.  He was advised to return if he developed return of symptoms despite antibiotics.  Patient and family were comfortable with plan and understood importance of follow-up with PCP for  hematuria in light of minimized work-up today.        Cyndra Numbers, MD 11/10/11 1024

## 2011-11-11 LAB — URINE CULTURE

## 2011-11-20 ENCOUNTER — Encounter: Payer: Self-pay | Admitting: Internal Medicine

## 2011-11-20 ENCOUNTER — Ambulatory Visit (INDEPENDENT_AMBULATORY_CARE_PROVIDER_SITE_OTHER): Payer: Medicare Other | Admitting: Internal Medicine

## 2011-11-20 VITALS — BP 118/72 | HR 70 | Temp 98.0°F | Resp 16 | Wt 170.2 lb

## 2011-11-20 DIAGNOSIS — Z23 Encounter for immunization: Secondary | ICD-10-CM

## 2011-11-20 DIAGNOSIS — R319 Hematuria, unspecified: Secondary | ICD-10-CM | POA: Diagnosis not present

## 2011-11-21 LAB — URINALYSIS, ROUTINE W REFLEX MICROSCOPIC
Bilirubin Urine: NEGATIVE
Glucose, UA: NEGATIVE mg/dL
Leukocytes, UA: NEGATIVE
Specific Gravity, Urine: 1.015 (ref 1.005–1.030)
pH: 7.5 (ref 5.0–8.0)

## 2011-11-21 LAB — URINALYSIS, MICROSCOPIC ONLY
Crystals: NONE SEEN
Squamous Epithelial / LPF: NONE SEEN

## 2011-11-26 ENCOUNTER — Telehealth: Payer: Self-pay | Admitting: *Deleted

## 2011-11-26 DIAGNOSIS — R739 Hyperglycemia, unspecified: Secondary | ICD-10-CM

## 2011-11-26 DIAGNOSIS — R319 Hematuria, unspecified: Secondary | ICD-10-CM

## 2011-11-26 MED ORDER — SULFAMETHOXAZOLE-TRIMETHOPRIM 800-160 MG PO TABS: 1.0000 | ORAL_TABLET | Freq: Two times a day (BID) | ORAL | Status: DC

## 2011-11-26 NOTE — Telephone Encounter (Signed)
Message copied by Regis Bill on Tue Nov 26, 2011  3:40 PM ------      Message from: Edwyna Perfect      Created: Sun Nov 24, 2011 10:51 PM       Repeat ua improved. Has blood but smaller amount. Recommend septra ds bid x 7d. Then return to lab for repeat ua with micro(hematuria) and also a1c(hyperglycemia)(ED ua showed some sugar in urine)

## 2011-11-26 NOTE — Telephone Encounter (Signed)
Patient informed, understood & agreed; new Rx to pharmacy & future labs placed in EMR/SLS

## 2011-11-28 DIAGNOSIS — R319 Hematuria, unspecified: Secondary | ICD-10-CM | POA: Insufficient documentation

## 2011-11-28 NOTE — Progress Notes (Signed)
  Subjective:    Patient ID: Anthony [name], male    DOB: June 29, 1945, 66 y.o.   MRN: [id number]  HPI Pt presents to clinic for followup of multiple medical problems. Recent ED visit August 31st with gross hematuria. Lasted several hours. Maintained good urine output. Had mild left back pain and left lower quadrant pain temporarily now resolved. Urinary culture negative. Was placed on Keflex and symptoms of pain and hematuria resolved. Has minimal discomfort at penis tip without urethral discharge. No injury or trauma. No associated fever chills nausea or vomiting. Has a history of kidney stone on one occasion status post urology evaluation. No complaints.  Past Medical History  Diagnosis Date  . GERD (gastroesophageal reflux disease)   . Hyperlipidemia   . Hypertension     8-10 years  . Cataract   . CAD (coronary artery disease) March 2011    with a non-ST elevation myocardial infarction. cardiac catherization whic revealed-left main was normal. The LAD had proximal calcification. There was long proximal 30% stenosis. Circumflex inthe AV groove had  proximal  60% stenosis at a mid obtuse marginal. This first large mid obtuse marginal had ostial 70% stenosis. The right coronary artery had a proximal long 50% stenosis.   . Kidney stone   . External hemorrhoids     large external   Past Surgical History  Procedure Date  . Cataract extraction   . Coronary angioplasty with stent placement   . Colonoscopy 2009    [facility]    reports that he has quit smoking. He has never used smokeless tobacco. He reports that he does not drink alcohol or use illicit drugs. family history includes Coronary artery disease in his father and Heart disease in his mother and sister.  There is no history of Other. No Known Allergies    Review of Systems see hpi     Objective:   Physical Exam  Nursing note and vitals reviewed. Constitutional: He appears well-developed and well-nourished. No distress.    Genitourinary: Testes normal and penis normal. Right testis shows no mass. Left testis shows no mass. No penile erythema or penile tenderness. No discharge found.  Skin: He is not diaphoretic.          Assessment & Plan:

## 2011-11-28 NOTE — Assessment & Plan Note (Signed)
Gross hematuria resolved. Obtain urinalysis with microscopic examination. Consider CT imaging or urology if symptoms recur.

## 2011-11-29 ENCOUNTER — Telehealth: Payer: Self-pay | Admitting: *Deleted

## 2011-11-29 NOTE — Telephone Encounter (Signed)
Called pt.  He reports that he saw blood in urine last night.  No further hematuria today.  Reports that he saw a small stone in strainer.   Had some back pain but this is now better.  Recommended that he follow up in office with Dr. Rodena Medin on Monday.  Go to the ED over the weekend if worsening low back pain, fever or recurrent hematuria.  Pt verbalizes understanding.

## 2011-11-29 NOTE — Telephone Encounter (Signed)
Pt was seen in office 09.11.13 post ED for hematuria [given Keflex ABX]; has Hx of kidney Stone. Labs done & when resulted pt placed on Septra x7 days w/future orders placed for repeat UA w/micro [small amt blood] & A1C [sugar seen in urine at ED] after completion of ABX; office note states "consider CT imaging or Urology if symptoms recur". Pt called this morning and reports that he had reoccurrence of blood in urine last night that was grossly present as Pre-ED visit with flank pain and possible may have passed stone this morning [reports "spraying everything down & there was something definitely in it this morning"]. Please advise if patient needs imaging today and/or Urology referral prior to Dr. Rodena Medin return to office on Monday 09.23.13/SLS  Thanks.

## 2011-12-02 ENCOUNTER — Ambulatory Visit (INDEPENDENT_AMBULATORY_CARE_PROVIDER_SITE_OTHER): Payer: Medicare Other | Admitting: Internal Medicine

## 2011-12-02 ENCOUNTER — Telehealth: Payer: Self-pay | Admitting: *Deleted

## 2011-12-02 ENCOUNTER — Encounter: Payer: Self-pay | Admitting: Internal Medicine

## 2011-12-02 ENCOUNTER — Ambulatory Visit (HOSPITAL_BASED_OUTPATIENT_CLINIC_OR_DEPARTMENT_OTHER)
Admission: RE | Admit: 2011-12-02 | Discharge: 2011-12-02 | Disposition: A | Discharge status: Home / Self Care | Payer: Medicare Other | Source: Ambulatory Visit | Attending: Internal Medicine | Admitting: Internal Medicine

## 2011-12-02 VITALS — BP 126/64 | HR 66 | Temp 98.1°F | Resp 16 | Wt 166.0 lb

## 2011-12-02 DIAGNOSIS — N200 Calculus of kidney: Secondary | ICD-10-CM | POA: Diagnosis not present

## 2011-12-02 DIAGNOSIS — K4020 Bilateral inguinal hernia, without obstruction or gangrene, not specified as recurrent: Secondary | ICD-10-CM | POA: Insufficient documentation

## 2011-12-02 DIAGNOSIS — R319 Hematuria, unspecified: Secondary | ICD-10-CM | POA: Diagnosis not present

## 2011-12-02 DIAGNOSIS — R739 Hyperglycemia, unspecified: Secondary | ICD-10-CM

## 2011-12-02 DIAGNOSIS — R1032 Left lower quadrant pain: Secondary | ICD-10-CM | POA: Insufficient documentation

## 2011-12-02 DIAGNOSIS — R7309 Other abnormal glucose: Secondary | ICD-10-CM | POA: Diagnosis not present

## 2011-12-02 DIAGNOSIS — D1803 Hemangioma of intra-abdominal structures: Secondary | ICD-10-CM | POA: Diagnosis not present

## 2011-12-02 DIAGNOSIS — R81 Glycosuria: Secondary | ICD-10-CM | POA: Diagnosis not present

## 2011-12-02 LAB — HEMOGLOBIN A1C: Mean Plasma Glucose: 120 mg/dL — ABNORMAL HIGH (ref <117)

## 2011-12-02 MED ORDER — SULFAMETHOXAZOLE-TRIMETHOPRIM 800-160 MG PO TABS: 1.0000 | ORAL_TABLET | Freq: Two times a day (BID) | ORAL | Status: DC

## 2011-12-02 NOTE — Assessment & Plan Note (Signed)
Obtain A1c.  

## 2011-12-02 NOTE — Telephone Encounter (Signed)
Pt informed, understood & agreed; new ABX Rx to pharmacy/SLS

## 2011-12-02 NOTE — Telephone Encounter (Signed)
Message copied by Regis Bill on Mon Dec 02, 2011  4:35 PM ------      Message from: Edwyna Perfect      Created: Mon Dec 02, 2011 12:25 PM       Ct scan shows stones in the kidneys and one that may be dropping into the beginning of the tube. No stone in the tubes right now. Because of the pain would recommend another round of abx septra ds bid x 7d. If pain does not go away with abx or has any further blood then will need urology referral

## 2011-12-02 NOTE — Assessment & Plan Note (Signed)
Recently resolved now with associated left low back pain and left lower abdominal pain. Suggestive of possible kidney stone. Schedule abdominal pelvic CT without contrast. Repeat urinalysis with micro-.

## 2011-12-02 NOTE — Progress Notes (Signed)
  Subjective:    Patient ID: Anthony [name], male    DOB: 1945/04/07, 66 y.o.   MRN: [id number]  HPI Pt presents to clinic for evaluation of hematuria. Hematuria has resolved however yesterday began having left low back pain and left lower quadrant pain. Began to strain his urine and believes he found two small stones. Maintains good urine output and denies fever chills nausea or vomiting. Recent urinalysis at the emergency department demonstrated glucose  Past Medical History  Diagnosis Date  . GERD (gastroesophageal reflux disease)   . Hyperlipidemia   . Hypertension     8-10 years  . Cataract   . CAD (coronary artery disease) March 2011    with a non-ST elevation myocardial infarction. cardiac catherization whic revealed-left main was normal. The LAD had proximal calcification. There was long proximal 30% stenosis. Circumflex inthe AV groove had  proximal  60% stenosis at a mid obtuse marginal. This first large mid obtuse marginal had ostial 70% stenosis. The right coronary artery had a proximal long 50% stenosis.   . Kidney stone   . External hemorrhoids     large external   Past Surgical History  Procedure Date  . Cataract extraction   . Coronary angioplasty with stent placement   . Colonoscopy 2009    [facility]    reports that he has quit smoking. He has never used smokeless tobacco. He reports that he does not drink alcohol or use illicit drugs. family history includes Coronary artery disease in his father and Heart disease in his mother and sister.  There is no history of Other. No Known Allergies   Review of Systems see hpi     Objective:   Physical Exam  Nursing note and vitals reviewed. Constitutional: He appears well-developed and well-nourished. No distress.  HENT:  Head: Normocephalic.  Abdominal: Soft. He exhibits no distension.       Mild tenderness to palpation left lower quadrant without rebound guarding rigidity. No mass appreciated  Musculoskeletal:      No CVA tenderness  Neurological: He is alert.  Skin: Skin is warm and dry. He is not diaphoretic.  Psychiatric: He has a normal mood and affect.          Assessment & Plan:

## 2011-12-03 LAB — URINALYSIS, ROUTINE W REFLEX MICROSCOPIC
Bilirubin Urine: NEGATIVE
Glucose, UA: NEGATIVE mg/dL
Ketones, ur: NEGATIVE mg/dL
Protein, ur: NEGATIVE mg/dL
Urobilinogen, UA: 1 mg/dL (ref 0.0–1.0)

## 2011-12-03 LAB — URINALYSIS, MICROSCOPIC ONLY
Bacteria, UA: NONE SEEN
Casts: NONE SEEN
Crystals: NONE SEEN

## 2011-12-05 ENCOUNTER — Ambulatory Visit: Payer: Medicare Other

## 2011-12-05 DIAGNOSIS — Z23 Encounter for immunization: Secondary | ICD-10-CM

## 2012-02-04 DIAGNOSIS — H3530 Unspecified macular degeneration: Secondary | ICD-10-CM | POA: Diagnosis not present

## 2012-02-04 DIAGNOSIS — H35729 Serous detachment of retinal pigment epithelium, unspecified eye: Secondary | ICD-10-CM | POA: Diagnosis not present

## 2012-02-12 ENCOUNTER — Ambulatory Visit (INDEPENDENT_AMBULATORY_CARE_PROVIDER_SITE_OTHER): Payer: Medicare Other | Admitting: Internal Medicine

## 2012-02-12 ENCOUNTER — Encounter: Payer: Self-pay | Admitting: Internal Medicine

## 2012-02-12 VITALS — BP 130/70 | HR 64 | Temp 98.2°F | Resp 16 | Ht 65.25 in | Wt 170.1 lb

## 2012-02-12 DIAGNOSIS — N200 Calculus of kidney: Secondary | ICD-10-CM

## 2012-02-12 DIAGNOSIS — E785 Hyperlipidemia, unspecified: Secondary | ICD-10-CM | POA: Diagnosis not present

## 2012-02-12 DIAGNOSIS — I10 Essential (primary) hypertension: Secondary | ICD-10-CM | POA: Diagnosis not present

## 2012-02-12 DIAGNOSIS — Z0000 Encounter for general adult medical examination without abnormal findings: Secondary | ICD-10-CM | POA: Diagnosis not present

## 2012-02-12 DIAGNOSIS — Z125 Encounter for screening for malignant neoplasm of prostate: Secondary | ICD-10-CM

## 2012-02-12 DIAGNOSIS — Z79899 Other long term (current) drug therapy: Secondary | ICD-10-CM | POA: Diagnosis not present

## 2012-02-12 LAB — CBC WITH DIFFERENTIAL/PLATELET
Hemoglobin: 14.3 g/dL (ref 13.0–17.0)
Lymphs Abs: 2.5 10*3/uL (ref 0.7–4.0)
MCH: 31.6 pg (ref 26.0–34.0)
Monocytes Relative: 9 % (ref 3–12)
Neutro Abs: 4 10*3/uL (ref 1.7–7.7)
Neutrophils Relative %: 54 % (ref 43–77)
WBC: 7.4 10*3/uL (ref 4.0–10.5)

## 2012-02-12 LAB — BASIC METABOLIC PANEL
CO2: 26 mEq/L (ref 19–32)
Glucose, Bld: 89 mg/dL (ref 70–99)
Potassium: 4.1 mEq/L (ref 3.5–5.3)
Sodium: 139 mEq/L (ref 135–145)

## 2012-02-12 LAB — HEPATIC FUNCTION PANEL
Bilirubin, Direct: 0.2 mg/dL (ref 0.0–0.3)
Total Bilirubin: 0.8 mg/dL (ref 0.3–1.2)

## 2012-02-12 LAB — PSA, MEDICARE: PSA: 2.46 ng/mL (ref <=4.00)

## 2012-02-12 LAB — LIPID PANEL
Total CHOL/HDL Ratio: 2.8 Ratio
VLDL: 36 mg/dL (ref 0–40)

## 2012-02-12 MED ORDER — AMLODIPINE BESYLATE 10 MG PO TABS: 10.0000 mg | ORAL_TABLET | Freq: Every day | ORAL | Status: DC

## 2012-02-12 MED ORDER — LOSARTAN POTASSIUM 100 MG PO TABS: 100.0000 mg | ORAL_TABLET | Freq: Every day | ORAL | Status: DC

## 2012-02-12 NOTE — Assessment & Plan Note (Signed)
Obtain lipid/lft. 

## 2012-02-12 NOTE — Progress Notes (Signed)
  Subjective:    Patient ID: Anthony [name], male    DOB: [birth year], 66 y.o.   MRN: [id number]  HPI Pt presents to clinic for wellness visit. Performed depression screening with no evidence of clinical depression. Discussed advanced directives and has living will. No recent falls and do not appear to be at fall risk based on screening. Immunizations and preventive care reviewed. Due for lab work.  Past Medical History  Diagnosis Date  . GERD (gastroesophageal reflux disease)   . Hyperlipidemia   . Hypertension     8-10 years  . Cataract   . CAD (coronary artery disease) March 2011    with a non-ST elevation myocardial infarction. cardiac catherization whic revealed-left main was normal. The LAD had proximal calcification. There was long proximal 30% stenosis. Circumflex inthe AV groove had  proximal  60% stenosis at a mid obtuse marginal. This first large mid obtuse marginal had ostial 70% stenosis. The right coronary artery had a proximal long 50% stenosis.   . Kidney stone   . External hemorrhoids     large external   Past Surgical History  Procedure Date  . Cataract extraction   . Coronary angioplasty with stent placement   . Colonoscopy 2009    [facility]    reports that he has quit smoking. He has never used smokeless tobacco. He reports that he does not drink alcohol or use illicit drugs. family history includes Coronary artery disease in his father and Heart disease in his mother and sister.  There is no history of Other. No Known Allergies     Review of Systems see hpi     Objective:   Physical Exam  Nursing note and vitals reviewed. Constitutional: He appears well-developed and well-nourished. No distress.  HENT:  Head: Normocephalic and atraumatic.  Right Ear: External ear normal.  Left Ear: External ear normal.  Eyes: Conjunctivae normal are normal. No scleral icterus.  Neck: Neck supple. No JVD present.  Cardiovascular: Normal rate, regular rhythm and  normal heart sounds.  Exam reveals no friction rub.   No murmur heard. Pulmonary/Chest: Effort normal and breath sounds normal. No respiratory distress. He has no wheezes. He has no rales.  Neurological: He is alert.  Skin: Skin is warm and dry. He is not diaphoretic.  Psychiatric: He has a normal mood and affect.          Assessment & Plan:

## 2012-02-12 NOTE — Assessment & Plan Note (Signed)
Recommendations reviewed with patient.

## 2012-02-13 LAB — URINALYSIS, ROUTINE W REFLEX MICROSCOPIC
Glucose, UA: NEGATIVE mg/dL
Leukocytes, UA: NEGATIVE
Protein, ur: NEGATIVE mg/dL
pH: 7.5 (ref 5.0–8.0)

## 2012-02-17 ENCOUNTER — Other Ambulatory Visit: Payer: Self-pay | Admitting: *Deleted

## 2012-02-17 NOTE — Telephone Encounter (Signed)
Called pt and let him know that all his levels were normal with his blood work. Pt pleased.

## 2012-02-19 ENCOUNTER — Encounter: Payer: Self-pay | Admitting: Cardiology

## 2012-02-19 ENCOUNTER — Ambulatory Visit (INDEPENDENT_AMBULATORY_CARE_PROVIDER_SITE_OTHER): Payer: Medicare Other | Admitting: Cardiology

## 2012-02-19 VITALS — BP 140/78 | HR 69 | Wt 171.0 lb

## 2012-02-19 DIAGNOSIS — R0989 Other specified symptoms and signs involving the circulatory and respiratory systems: Secondary | ICD-10-CM | POA: Diagnosis not present

## 2012-02-19 DIAGNOSIS — I2510 Atherosclerotic heart disease of native coronary artery without angina pectoris: Secondary | ICD-10-CM | POA: Diagnosis not present

## 2012-02-19 MED ORDER — NITROGLYCERIN 0.4 MG SL SUBL: 0.4000 mg | SUBLINGUAL_TABLET | SUBLINGUAL | Status: DC | PRN

## 2012-02-19 MED ORDER — ROSUVASTATIN CALCIUM 40 MG PO TABS: 40.0000 mg | ORAL_TABLET | Freq: Every day | ORAL | Status: DC

## 2012-02-19 MED ORDER — METOPROLOL TARTRATE 25 MG PO TABS: ORAL_TABLET | ORAL | Status: DC

## 2012-02-19 NOTE — Assessment & Plan Note (Signed)
Left carotid bruit noted. Schedule carotid Dopplers.

## 2012-02-19 NOTE — Assessment & Plan Note (Signed)
Blood pressure is controlled. Renal function and potassium monitored by primary care.

## 2012-02-19 NOTE — Progress Notes (Signed)
HPI: Pleasant gentleman admitted in March 2011 with a non-ST elevation myocardial infarction. He underwent cardiac catheterization which revealed - Left main was normal. The LAD had proximal calcification. There was long proximal 30% stenosis. Circumflex in the AV groove had proximal 60% stenosis at a mid obtuse marginal. This first large mid obtuse marginal had ostial 70% stenosis. The right coronary artery had a proximal long 50% stenosis. There was mid subtotal stenosis. The EF was 50% with inferior hypokinesis. Patient had a drug-eluting stent to the right coronary artery at that time. Abdominal ultrasound in July of 2011 showed no aneurysm. I last saw him in Dec of 2012. Since then the patient denies any dyspnea on exertion, orthopnea, PND, pedal edema, palpitations, syncope or chest pain.   Current Outpatient Prescriptions  Medication Sig Dispense Refill  . amLODipine (NORVASC) 10 MG tablet Take 1 tablet (10 mg total) by mouth daily.  90 tablet  3  . aspirin 81 MG tablet Take 81 mg by mouth daily.       Marland Kitchen losartan (COZAAR) 100 MG tablet Take 1 tablet (100 mg total) by mouth daily.  90 tablet  3  . metoprolol tartrate (LOPRESSOR) 25 MG tablet Take 25 mg by mouth 2 (two) times daily. 1/2 tablet by mouth twice a day      . Multiple Vitamin (MULTIVITAMIN) tablet Take 1 tablet by mouth daily.        . nitroGLYCERIN (NITROSTAT) 0.4 MG SL tablet Place 0.4 mg under the tongue every 5 (five) minutes as needed.        . rosuvastatin (CRESTOR) 40 MG tablet Take 1 tablet (40 mg total) by mouth daily.  90 tablet  4     Past Medical History  Diagnosis Date  . GERD (gastroesophageal reflux disease)   . Hyperlipidemia   . Hypertension     8-10 years  . Cataract   . CAD (coronary artery disease) March 2011    with a non-ST elevation myocardial infarction. cardiac catherization whic revealed-left main was normal. The LAD had proximal calcification. There was long proximal 30% stenosis. Circumflex  inthe AV groove had  proximal  60% stenosis at a mid obtuse marginal. This first large mid obtuse marginal had ostial 70% stenosis. The right coronary artery had a proximal long 50% stenosis.   . Kidney stone   . External hemorrhoids     large external    Past Surgical History  Procedure Date  . Cataract extraction   . Coronary angioplasty with stent placement   . Colonoscopy 2009    [facility]    History   Social History  . Marital Status: Married    Spouse Name: N/A    Number of Children: N/A  . Years of Education: N/A   Occupational History  . Not on file.   Social History Main Topics  . Smoking status: Former Games developer  . Smokeless tobacco: Never Used     Comment: 40 pack year history  . Alcohol Use: No  . Drug Use: No  . Sexually Active: Not on file   Other Topics Concern  . Not on file   Social History Narrative   married -43 yrs  (Helen Gassen)grew up in [place] has one son - two grandaugthers  He is retired.  He had  a 40 pack-year smoking history but has discontinued.  prev occuptation -  [employer]Alcohol use-noSmoking Status:  quitPacks/Day:  0.5Caffeine use/day:  2 beverages dailyDoes Patient Exercise:  yes  ROS: no fevers or chills, productive cough, hemoptysis, dysphasia, odynophagia, melena, hematochezia, dysuria, hematuria, rash, seizure activity, orthopnea, PND, pedal edema, claudication. Remaining systems are negative.  Physical Exam: Well-developed well-nourished in no acute distress.  Skin is warm and dry.  HEENT is normal.  Neck is supple.  Chest is clear to auscultation with normal expansion.  Cardiovascular exam is regular rate and rhythm.  Abdominal exam nontender or distended. No masses palpated. Extremities show no edema. neuro grossly intact  ECG sinus rhythm at a rate of 69. No ST changes.

## 2012-02-19 NOTE — Patient Instructions (Addendum)
Your physician wants you to follow-up in: ONE YEAR WITH DR Jens Som IN [place] You will receive a reminder letter in the mail two months in advance. If you don't receive a letter, please call our office to schedule the follow-up appointment.   Your physician has requested that you have en exercise stress myoview. For further information please visit [email or web address] Please follow instruction sheet, as given.   Your physician has requested that you have a carotid duplex. This test is an ultrasound of the carotid arteries in your neck. It looks at blood flow through these arteries that supply the brain with blood. Allow one hour for this exam. There are no restrictions or special instructions.

## 2012-02-19 NOTE — Assessment & Plan Note (Signed)
Continue aspirin and statin. Plan functional study to risk stratify.

## 2012-02-19 NOTE — Assessment & Plan Note (Signed)
Continue statin. Lipids and liver monitored by primary care. 

## 2012-02-25 ENCOUNTER — Ambulatory Visit (HOSPITAL_COMMUNITY): Payer: Medicare Other | Attending: Cardiology | Admitting: Radiology

## 2012-02-25 VITALS — BP 147/75 | Ht 65.0 in | Wt 165.0 lb

## 2012-02-25 DIAGNOSIS — I252 Old myocardial infarction: Secondary | ICD-10-CM | POA: Insufficient documentation

## 2012-02-25 DIAGNOSIS — I2510 Atherosclerotic heart disease of native coronary artery without angina pectoris: Secondary | ICD-10-CM

## 2012-02-25 DIAGNOSIS — I10 Essential (primary) hypertension: Secondary | ICD-10-CM | POA: Insufficient documentation

## 2012-02-25 MED ORDER — TECHNETIUM TC 99M SESTAMIBI GENERIC - CARDIOLITE
30.0000 | Freq: Once | INTRAVENOUS | Status: AC | PRN
  Administered 2012-02-25: 30 via INTRAVENOUS

## 2012-02-25 MED ORDER — TECHNETIUM TC 99M SESTAMIBI GENERIC - CARDIOLITE
10.0000 | Freq: Once | INTRAVENOUS | Status: AC | PRN
  Administered 2012-02-25: 10 via INTRAVENOUS

## 2012-02-25 NOTE — Progress Notes (Signed)
[facility] SITE 3 NUCLEAR MED [address] [place], [place] [address] [phone]    Cardiology Nuclear Med Study  Anthony [name] is a 66 y.o. male     MRN : [id number]     DOB: 05-07-1945  Procedure Date: 02/25/2012  Nuclear Med Background Indication for Stress Test:  Evaluation for Ischemia and Stent Patency History:  05/2009 MI-NSTEMI-Heart Cath-stent to RCA residual N/O Dz EF;50% with inferior hypokinesis Cardiac Risk Factors: History of Smoking, Hypertension and Lipids  Symptoms:  n/a   Nuclear Pre-Procedure Caffeine/Decaff Intake:  None> 12 hrs NPO After: 5:00pm   Lungs:  clear O2 Sat: 98% on room air. IV 0.9% NS with Angio Cath:  20g  IV Site: R Antecubital x 1, tolerated well IV Started by:  Irean Hong, RN  Chest Size (in):  40 Cup Size: n/a  Height: 5\' 5"  (1.651 m)  Weight:  165 lb (74.844 kg)  BMI:  Body mass index is 27.46 kg/(m^2). Tech Comments:  Held Lopressor x 24 hrs    Nuclear Med Study 1 or 2 day study: 1 day  Stress Test Type:  Stress  Reading MD: Kristeen Miss, MD  Order Authorizing Provider:  Olga Millers, MD  Resting Radionuclide: Technetium 15m Sestamibi  Resting Radionuclide Dose: 11.0 mCi   Stress Radionuclide:  Technetium 47m Sestamibi  Stress Radionuclide Dose: 33.0 mCi           Stress Protocol Rest HR: 71 Stress HR: 142  Rest BP: 147/75 Stress BP: 206/69  Exercise Time (min): 9:03 METS: 10.10   Predicted Max HR: 154 bpm % Max HR: 92.21 bpm Rate Pressure Product: 21308    Dose of Adenosine (mg):  n/a Dose of Lexiscan: n/a mg  Dose of Atropine (mg): n/a Dose of Dobutamine: n/a mcg/kg/min (at max HR)  Stress Test Technologist: Milana Na, EMT-P  Nuclear Technologist:  Domenic Polite, CNMT     Rest Procedure:  Myocardial perfusion imaging was performed at rest 45 minutes following the intravenous administration of Technetium 41m Sestamibi. Rest ECG: NSR - Normal EKG  Stress Procedure:  The patient exercised on  the treadmill utilizing the Bruce Protocol for 9:03 minutes. The patient stopped due to fatigue and denied any chest pain.  Technetium 76m Sestamibi was injected at peak exercise and myocardial perfusion imaging was performed after a brief delay. Stress ECG: No significant change from baseline ECG  QPS Raw Data Images:  Normal; no motion artifact; normal heart/lung ratio. Stress Images:  Normal homogeneous uptake in all areas of the myocardium. Rest Images:  Normal homogeneous uptake in all areas of the myocardium. Subtraction (SDS):  No evidence of ischemia. Transient Ischemic Dilatation (Normal <1.22):  0.97 Lung/Heart Ratio (Normal <0.45):  0.36  Quantitative Gated Spect Images QGS EDV:  85 ml QGS ESV:  25 ml  Impression Exercise Capacity:  Good exercise capacity. BP Response:  Hypertensive blood pressure response. Clinical Symptoms:  No significant symptoms noted. ECG Impression:  No significant ST segment change suggestive of ischemia. Comparison with Prior Nuclear Study: No images to compare  Overall Impression:  Normal stress nuclear study.  No evidence of ischemia.  Normal LV function.  LV Ejection Fraction: 71%.  LV Wall Motion:  NL LV Function; NL Wall Motion.   Vesta Mixer, Montez Hageman., MD, [facility] 02/25/2012, 5:01 PM Office - [phone] Pager [phone]

## 2012-03-02 ENCOUNTER — Encounter (INDEPENDENT_AMBULATORY_CARE_PROVIDER_SITE_OTHER): Payer: Medicare Other

## 2012-03-02 DIAGNOSIS — I6529 Occlusion and stenosis of unspecified carotid artery: Secondary | ICD-10-CM

## 2012-03-02 DIAGNOSIS — R0989 Other specified symptoms and signs involving the circulatory and respiratory systems: Secondary | ICD-10-CM | POA: Diagnosis not present

## 2012-03-17 DIAGNOSIS — H35729 Serous detachment of retinal pigment epithelium, unspecified eye: Secondary | ICD-10-CM | POA: Diagnosis not present

## 2012-03-17 DIAGNOSIS — H35719 Central serous chorioretinopathy, unspecified eye: Secondary | ICD-10-CM | POA: Diagnosis not present

## 2012-03-17 DIAGNOSIS — H3530 Unspecified macular degeneration: Secondary | ICD-10-CM | POA: Diagnosis not present

## 2012-06-09 DIAGNOSIS — L821 Other seborrheic keratosis: Secondary | ICD-10-CM | POA: Diagnosis not present

## 2012-06-09 DIAGNOSIS — L919 Hypertrophic disorder of the skin, unspecified: Secondary | ICD-10-CM | POA: Diagnosis not present

## 2012-06-09 DIAGNOSIS — L570 Actinic keratosis: Secondary | ICD-10-CM | POA: Diagnosis not present

## 2012-07-20 ENCOUNTER — Encounter: Payer: Self-pay | Admitting: Family

## 2012-07-20 ENCOUNTER — Telehealth: Payer: Self-pay | Admitting: Family

## 2012-07-20 ENCOUNTER — Ambulatory Visit (INDEPENDENT_AMBULATORY_CARE_PROVIDER_SITE_OTHER): Payer: Medicare Other | Admitting: Family

## 2012-07-20 ENCOUNTER — Ambulatory Visit (HOSPITAL_BASED_OUTPATIENT_CLINIC_OR_DEPARTMENT_OTHER)
Admission: RE | Admit: 2012-07-20 | Discharge: 2012-07-20 | Disposition: A | Discharge status: Home / Self Care | Payer: Medicare Other | Source: Ambulatory Visit | Attending: Family | Admitting: Family

## 2012-07-20 VITALS — BP 130/80 | HR 79 | Temp 97.9°F | Resp 16 | Ht 65.25 in | Wt 171.1 lb

## 2012-07-20 DIAGNOSIS — R109 Unspecified abdominal pain: Secondary | ICD-10-CM | POA: Insufficient documentation

## 2012-07-20 DIAGNOSIS — R3129 Other microscopic hematuria: Secondary | ICD-10-CM | POA: Insufficient documentation

## 2012-07-20 DIAGNOSIS — N200 Calculus of kidney: Secondary | ICD-10-CM | POA: Insufficient documentation

## 2012-07-20 DIAGNOSIS — K4090 Unilateral inguinal hernia, without obstruction or gangrene, not specified as recurrent: Secondary | ICD-10-CM | POA: Diagnosis not present

## 2012-07-20 DIAGNOSIS — M545 Low back pain, unspecified: Secondary | ICD-10-CM | POA: Diagnosis not present

## 2012-07-20 DIAGNOSIS — M549 Dorsalgia, unspecified: Secondary | ICD-10-CM

## 2012-07-20 DIAGNOSIS — N400 Enlarged prostate without lower urinary tract symptoms: Secondary | ICD-10-CM | POA: Diagnosis not present

## 2012-07-20 DIAGNOSIS — D1803 Hemangioma of intra-abdominal structures: Secondary | ICD-10-CM | POA: Diagnosis not present

## 2012-07-20 LAB — BASIC METABOLIC PANEL
BUN: 13 mg/dL (ref 6–23)
Calcium: 9 mg/dL (ref 8.4–10.5)
Potassium: 3.9 mEq/L (ref 3.5–5.3)

## 2012-07-20 LAB — POCT URINALYSIS DIPSTICK
Glucose, UA: NEGATIVE
Nitrite, UA: NEGATIVE
Urobilinogen, UA: 0.2

## 2012-07-20 MED ORDER — CIPROFLOXACIN HCL 250 MG PO TABS: 250.0000 mg | ORAL_TABLET | Freq: Two times a day (BID) | ORAL | Status: DC

## 2012-07-20 NOTE — Telephone Encounter (Signed)
See CT result note

## 2012-07-20 NOTE — Patient Instructions (Signed)
Please complete CT scan on the first floor. Complete blood work prior to leaving. Call if increasing low back pain, of symptoms worsen, or if not improved in 2-3 days.

## 2012-07-20 NOTE — Assessment & Plan Note (Addendum)
UA notes large blood, moderate leuks.  Will send for culture, plan rx with cipro.  CT abd/pelvis, rule out obstructing stone.  BMET to assess renal function.  He is instructed to call if symptoms worsen, if fever, or if voiding problems.  Call if symptoms not improved in 2-3 days.  Addendum- CT notes non-obstructing stones- 13 mm stone left kidney.  Pain could be musculoskeletal in nature.

## 2012-07-20 NOTE — Progress Notes (Signed)
Subjective:    Patient ID: Anthony [name], male    DOB: September 23, 1945, 67 y.o.   MRN: [id number]  HPI  Anthony [name] is a 67 yr old male who presents today with chief complaint of low back pain.  Pain has been present x 2 months and radiates into the left groin.  He reports associated abdominal pain. Reports that pain is increasing.  Reports + hx of kidney stones. He denies fever, visible hematuria, nausea, vomitting or difficulty urinating. He has chronic nocturia which is unchanged.      Review of Systems See HPI  Past Medical History  Diagnosis Date  . GERD (gastroesophageal reflux disease)   . Hyperlipidemia   . Hypertension     8-10 years  . Cataract   . CAD (coronary artery disease) March 2011    with a non-ST elevation myocardial infarction. cardiac catherization whic revealed-left main was normal. The LAD had proximal calcification. There was long proximal 30% stenosis. Circumflex inthe AV groove had  proximal  60% stenosis at a mid obtuse marginal. This first large mid obtuse marginal had ostial 70% stenosis. The right coronary artery had a proximal long 50% stenosis.   . Kidney stone   . External hemorrhoids     large external    History   Social History  . Marital Status: Married    Spouse Name: N/A    Number of Children: N/A  . Years of Education: N/A   Occupational History  . Not on file.   Social History Main Topics  . Smoking status: Former Games developer  . Smokeless tobacco: Never Used     Comment: 40 pack year history  . Alcohol Use: No  . Drug Use: No  . Sexually Active: Not on file   Other Topics Concern  . Not on file   Social History Narrative   married -43 yrs  Myriam Jacobson [place])   grew up in [place]   He has one son - two grandaugthers     He is retired.  He had     a 40 pack-year smoking history but has discontinued.     prev occuptation -  [employer]   Alcohol use-no   Smoking Status:  quit   Packs/Day:  0.5   Caffeine use/day:  2  beverages daily   Does Patient Exercise:  yes             Past Surgical History  Procedure Laterality Date  . Cataract extraction    . Coronary angioplasty with stent placement    . Colonoscopy  2009    [facility]    Family History  Problem Relation Age of Onset  . Coronary artery disease Father     in his 23's  . Heart disease Mother     deceased of heart disease  . Heart disease Sister      2  older sisters  . Other Neg Hx     no lung cancer, colon cancer, or prostate    No Known Allergies  Current Outpatient Prescriptions on File Prior to Visit  Medication Sig Dispense Refill  . amLODipine (NORVASC) 10 MG tablet Take 1 tablet (10 mg total) by mouth daily.  90 tablet  3  . aspirin 81 MG tablet Take 81 mg by mouth daily.       Marland Kitchen losartan (COZAAR) 100 MG tablet Take 1 tablet (100 mg total) by mouth daily.  90 tablet  3  . metoprolol tartrate (LOPRESSOR) 25  MG tablet 1/2 tablet by mouth twice a day  90 tablet  4  . Multiple Vitamin (MULTIVITAMIN) tablet Take 1 tablet by mouth daily.        . rosuvastatin (CRESTOR) 40 MG tablet Take 1 tablet (40 mg total) by mouth daily.  90 tablet  4  . nitroGLYCERIN (NITROSTAT) 0.4 MG SL tablet Place 1 tablet (0.4 mg total) under the tongue every 5 (five) minutes as needed.  25 tablet  12   No current facility-administered medications on file prior to visit.    BP 130/80  Pulse 79  Temp(Src) 97.9 F (36.6 C) (Oral)  Resp 16  Ht 5' 5.25" (1.657 m)  Wt 171 lb 1.3 oz (77.601 kg)  BMI 28.26 kg/m2  SpO2 99%       Objective:   Physical Exam  Constitutional: He is oriented to person, place, and time. He appears well-developed and well-nourished. No distress.  Cardiovascular: Normal rate and regular rhythm.   No murmur heard. Pulmonary/Chest: Effort normal and breath sounds normal. No respiratory distress. He has no wheezes. He has no rales.  Abdominal: Soft. Bowel sounds are normal. He exhibits no distension and no mass. There  is no tenderness. There is no rebound and no guarding.  Genitourinary:  Neg CVAT bilaterally.  Musculoskeletal: He exhibits no edema.  Neurological: He is alert and oriented to person, place, and time.  Skin: Skin is warm and dry.  Psychiatric: He has a normal mood and affect. His behavior is normal. Judgment and thought content normal.          Assessment & Plan:

## 2012-07-21 ENCOUNTER — Telehealth: Payer: Self-pay | Admitting: Family

## 2012-07-21 DIAGNOSIS — R7309 Other abnormal glucose: Secondary | ICD-10-CM | POA: Diagnosis not present

## 2012-07-21 DIAGNOSIS — R739 Hyperglycemia, unspecified: Secondary | ICD-10-CM | POA: Insufficient documentation

## 2012-07-21 NOTE — Telephone Encounter (Signed)
Notified pt and he will return Thursday for blood test. Order entered.

## 2012-07-21 NOTE — Telephone Encounter (Signed)
Pls call pt and let him know that his sugar came back high.  I would like him to return to the lab for A1C to evaluate him for diabetes, dx is hyperglycemia.

## 2012-07-21 NOTE — Telephone Encounter (Signed)
Left message with patient's wife to return call concerning results.

## 2012-07-22 LAB — URINE CULTURE
Colony Count: NO GROWTH
Organism ID, Bacteria: NO GROWTH

## 2012-07-23 DIAGNOSIS — R7309 Other abnormal glucose: Secondary | ICD-10-CM | POA: Diagnosis not present

## 2012-07-24 ENCOUNTER — Encounter: Payer: Self-pay | Admitting: Family

## 2012-07-24 DIAGNOSIS — H3530 Unspecified macular degeneration: Secondary | ICD-10-CM | POA: Diagnosis not present

## 2012-07-24 DIAGNOSIS — H35719 Central serous chorioretinopathy, unspecified eye: Secondary | ICD-10-CM | POA: Diagnosis not present

## 2012-07-24 DIAGNOSIS — H35729 Serous detachment of retinal pigment epithelium, unspecified eye: Secondary | ICD-10-CM | POA: Diagnosis not present

## 2012-08-11 ENCOUNTER — Ambulatory Visit: Payer: Medicare Other | Admitting: Internal Medicine

## 2012-08-12 ENCOUNTER — Telehealth: Payer: Self-pay | Admitting: Family Medicine

## 2012-08-12 ENCOUNTER — Ambulatory Visit (INDEPENDENT_AMBULATORY_CARE_PROVIDER_SITE_OTHER): Payer: Medicare Other | Admitting: Family Medicine

## 2012-08-12 ENCOUNTER — Encounter: Payer: Self-pay | Admitting: Family Medicine

## 2012-08-12 VITALS — BP 122/72 | HR 68 | Temp 98.2°F | Ht 65.0 in | Wt 169.0 lb

## 2012-08-12 DIAGNOSIS — E785 Hyperlipidemia, unspecified: Secondary | ICD-10-CM | POA: Diagnosis not present

## 2012-08-12 DIAGNOSIS — N401 Enlarged prostate with lower urinary tract symptoms: Secondary | ICD-10-CM

## 2012-08-12 DIAGNOSIS — I25810 Atherosclerosis of coronary artery bypass graft(s) without angina pectoris: Secondary | ICD-10-CM

## 2012-08-12 DIAGNOSIS — I10 Essential (primary) hypertension: Secondary | ICD-10-CM

## 2012-08-12 DIAGNOSIS — N200 Calculus of kidney: Secondary | ICD-10-CM

## 2012-08-12 DIAGNOSIS — R739 Hyperglycemia, unspecified: Secondary | ICD-10-CM

## 2012-08-12 DIAGNOSIS — Z0000 Encounter for general adult medical examination without abnormal findings: Secondary | ICD-10-CM

## 2012-08-12 DIAGNOSIS — K219 Gastro-esophageal reflux disease without esophagitis: Secondary | ICD-10-CM

## 2012-08-12 DIAGNOSIS — I2510 Atherosclerotic heart disease of native coronary artery without angina pectoris: Secondary | ICD-10-CM

## 2012-08-12 DIAGNOSIS — N390 Urinary tract infection, site not specified: Secondary | ICD-10-CM | POA: Diagnosis not present

## 2012-08-12 DIAGNOSIS — R7309 Other abnormal glucose: Secondary | ICD-10-CM

## 2012-08-12 LAB — URINALYSIS
Leukocytes, UA: NEGATIVE
Nitrite: NEGATIVE
Specific Gravity, Urine: 1.017 (ref 1.005–1.030)
Urobilinogen, UA: 1 mg/dL (ref 0.0–1.0)
pH: 7.5 (ref 5.0–8.0)

## 2012-08-12 LAB — LIPID PANEL
LDL Cholesterol: 31 mg/dL (ref 0–99)
Triglycerides: 141 mg/dL (ref <150)

## 2012-08-12 LAB — HEPATIC FUNCTION PANEL
ALT: 17 U/L (ref 0–53)
AST: 17 U/L (ref 0–37)
Alkaline Phosphatase: 65 U/L (ref 39–117)
Indirect Bilirubin: 0.6 mg/dL (ref 0.0–0.9)
Total Protein: 6.7 g/dL (ref 6.0–8.3)

## 2012-08-12 NOTE — Assessment & Plan Note (Signed)
Well controlled no changes 

## 2012-08-12 NOTE — Progress Notes (Signed)
Patient ID: Anthony [name], male   DOB: [birth year], 67 y.o.   MRN: [id number] Anthony [name] [id number] February 02, 1946 08/12/2012      Progress Note-Follow Up  Subjective  Chief Complaint  Chief Complaint  Patient presents with  . Follow-up    6 month    HPI    Patient is a 67 year old Caucasian male in today for followup. Generally he is in good health although he did have a recent bad episode with kidney stones. He did pass a couple but still has a couple. No significant pain meds he does not believe. He does have some left hip and left lower quadrant pain intermittently but it is mild and time responds to Advil. No fevers or chills. No nausea or vomiting. No chest pain or palpitations, shortness of breath, GI or GU concerns otherwise noted. Does note last colonoscopy was in 2009 and he was told repeated in 5 years. Is willing to proceed with colonoscopy at his next visit to the office. He did have some adenomatous polyps years ago and initially had colonoscopies every 3 years. Last colonoscopy was 5 years  Past Medical History  Diagnosis Date  . GERD (gastroesophageal reflux disease)   . Hyperlipidemia   . Hypertension     8-10 years  . Cataract   . CAD (coronary artery disease) March 2011    with a non-ST elevation myocardial infarction. cardiac catherization whic revealed-left main was normal. The LAD had proximal calcification. There was long proximal 30% stenosis. Circumflex inthe AV groove had  proximal  60% stenosis at a mid obtuse marginal. This first large mid obtuse marginal had ostial 70% stenosis. The right coronary artery had a proximal long 50% stenosis.   . Kidney stone   . External hemorrhoids     large external    Past Surgical History  Procedure Laterality Date  . Cataract extraction    . Coronary angioplasty with stent placement    . Colonoscopy  2009    [facility]    Family History  Problem Relation Age of Onset  . Coronary artery disease Father     in  his 25's  . Heart disease Mother     deceased of heart disease  . Heart disease Sister      2  older sisters  . Other Neg Hx     no lung cancer, colon cancer, or prostate    History   Social History  . Marital Status: Married    Spouse Name: N/A    Number of Children: N/A  . Years of Education: N/A   Occupational History  . Not on file.   Social History Main Topics  . Smoking status: Former Games developer  . Smokeless tobacco: Never Used     Comment: 40 pack year history  . Alcohol Use: No  . Drug Use: No  . Sexually Active: Not on file   Other Topics Concern  . Not on file   Social History Narrative   married -43 yrs  Myriam Jacobson [place])   grew up in [place]   He has one son - two grandaugthers     He is retired.  He had     a 40 pack-year smoking history but has discontinued.     prev occuptation -  [employer]   Alcohol use-no   Smoking Status:  quit   Packs/Day:  0.5   Caffeine use/day:  2 beverages daily   Does Patient Exercise:  yes  Current Outpatient Prescriptions on File Prior to Visit  Medication Sig Dispense Refill  . amLODipine (NORVASC) 10 MG tablet Take 1 tablet (10 mg total) by mouth daily.  90 tablet  3  . aspirin 81 MG tablet Take 81 mg by mouth daily.       Marland Kitchen losartan (COZAAR) 100 MG tablet Take 1 tablet (100 mg total) by mouth daily.  90 tablet  3  . metoprolol tartrate (LOPRESSOR) 25 MG tablet 1/2 tablet by mouth twice a day  90 tablet  4  . Multiple Vitamin (MULTIVITAMIN) tablet Take 1 tablet by mouth daily.        . nitroGLYCERIN (NITROSTAT) 0.4 MG SL tablet Place 1 tablet (0.4 mg total) under the tongue every 5 (five) minutes as needed.  25 tablet  12  . rosuvastatin (CRESTOR) 40 MG tablet Take 1 tablet (40 mg total) by mouth daily.  90 tablet  4   No current facility-administered medications on file prior to visit.    No Known Allergies  Review of Systems  Review of Systems  Constitutional: Negative for fever and  malaise/fatigue.  HENT: Negative for congestion.   Eyes: Negative for discharge.  Respiratory: Negative for shortness of breath.   Cardiovascular: Negative for chest pain, palpitations and leg swelling.  Gastrointestinal: Negative for nausea, abdominal pain and diarrhea.  Genitourinary: Positive for flank pain. Negative for dysuria, urgency, frequency and hematuria.  Musculoskeletal: Positive for back pain. Negative for falls.  Skin: Negative for rash.  Neurological: Negative for loss of consciousness and headaches.  Endo/Heme/Allergies: Negative for polydipsia.  Psychiatric/Behavioral: Negative for depression and suicidal ideas. The patient is not nervous/anxious and does not have insomnia.     Objective  BP 122/72  Pulse 68  Temp(Src) 98.2 F (36.8 C) (Oral)  Ht 5\' 5"  (1.651 m)  Wt 169 lb 0.6 oz (76.676 kg)  BMI 28.13 kg/m2  SpO2 96%  Physical Exam  Physical Exam  Constitutional: He is oriented to person, place, and time and well-developed, well-nourished, and in no distress. No distress.  HENT:  Head: Normocephalic and atraumatic.  Eyes: Conjunctivae are normal.  Neck: Neck supple. No thyromegaly present.  Cardiovascular: Normal rate, regular rhythm and normal heart sounds.   No murmur heard. Pulmonary/Chest: Effort normal and breath sounds normal. No respiratory distress.  Abdominal: He exhibits no distension and no mass. There is no tenderness.  Musculoskeletal: He exhibits no edema.  Neurological: He is alert and oriented to person, place, and time.  Skin: Skin is warm.  Psychiatric: Memory, affect and judgment normal.    Lab Results  Component Value Date   TSH 2.418 08/10/2010   Lab Results  Component Value Date   WBC 7.4 02/12/2012   HGB 14.3 02/12/2012   HCT 42.8 02/12/2012   MCV 94.7 02/12/2012   PLT 265 02/12/2012   Lab Results  Component Value Date   CREATININE 0.74 07/20/2012   BUN 13 07/20/2012   NA 136 07/20/2012   K 3.9 07/20/2012   CL 104 07/20/2012    CO2 25 07/20/2012   Lab Results  Component Value Date   ALT 19 02/12/2012   AST 17 02/12/2012   ALKPHOS 72 02/12/2012   BILITOT 0.8 02/12/2012   Lab Results  Component Value Date   CHOL 113 02/12/2012   Lab Results  Component Value Date   HDL 41 02/12/2012   Lab Results  Component Value Date   LDLCALC 36 02/12/2012   Lab Results  Component Value  Date   TRIG 179* 02/12/2012   Lab Results  Component Value Date   CHOLHDL 2.8 02/12/2012     Assessment & Plan  HYPERLIPIDEMIA Mild elevation of Triglycerides, minimize carbs, start a krill oil cap recheck lipids  Hyperglycemia Mild, good hemoglobina1c, minimize simple carbs and maintain activity level  GERD No c/o, avoid offending foods  CAD, NATIVE VESSEL Stable, follows with cardiology, no concerns  HYPERTENSION Well controlled no changes  Kidney stones Mild symptoms of llq and left flank discomfort relieved by Advil likely musculoskeletal, check a urine and patient will hydrate and call with worsening symptoms

## 2012-08-12 NOTE — Assessment & Plan Note (Signed)
Mild elevation of Triglycerides, minimize carbs, start a krill oil cap recheck lipids

## 2012-08-12 NOTE — Assessment & Plan Note (Signed)
Mild, good hemoglobina1c, minimize simple carbs and maintain activity level

## 2012-08-12 NOTE — Assessment & Plan Note (Signed)
Mild symptoms of llq and left flank discomfort relieved by Advil likely musculoskeletal, check a urine and patient will hydrate and call with worsening symptoms

## 2012-08-12 NOTE — Assessment & Plan Note (Signed)
No c/o, avoid offending foods

## 2012-08-12 NOTE — Assessment & Plan Note (Addendum)
Stable, follows with cardiology, no concerns

## 2012-08-12 NOTE — Patient Instructions (Addendum)
Next exam annual exam with labs prior to visit lipid, renal, psa, tsh, liver, cbc, hgba1c  Cholesterol Cholesterol is a white, waxy, fat-like protein needed by your body in small amounts. The liver makes all the cholesterol you need. It is carried from the liver by the blood through the blood vessels. Deposits (plaque) may build up on blood vessel walls. This makes the arteries narrower and stiffer. Plaque increases the risk for heart attack and stroke. You cannot feel your cholesterol level even if it is very high. The only way to know is by a blood test to check your lipid (fats) levels. Once you know your cholesterol levels, you should keep a record of the test results. Work with your caregiver to to keep your levels in the desired range. WHAT THE RESULTS MEAN:  Total cholesterol is a rough measure of all the cholesterol in your blood.  LDL is the so-called bad cholesterol. This is the type that deposits cholesterol in the walls of the arteries. You want this level to be low.  HDL is the good cholesterol because it cleans the arteries and carries the LDL away. You want this level to be high.  Triglycerides are fat that the body can either burn for energy or store. High levels are closely linked to heart disease. DESIRED LEVELS:  Total cholesterol below 200.  LDL below 100 for people at risk, below 70 for very high risk.  HDL above 50 is good, above 60 is best.  Triglycerides below 150. HOW TO LOWER YOUR CHOLESTEROL:  Diet.  Choose fish or white meat chicken and [place], roasted or baked. Limit fatty cuts of red meat, fried foods, and processed meats, such as sausage and lunch meat.  Eat lots of fresh fruits and vegetables. Choose whole grains, beans, pasta, potatoes and cereals.  Use only small amounts of olive, corn or canola oils. Avoid butter, mayonnaise, shortening or palm kernel oils. Avoid foods with trans-fats.  Use skim/nonfat milk and low-fat/nonfat yogurt and cheeses.  Avoid whole milk, cream, ice cream, egg yolks and cheeses. Healthy desserts include angel food cake, ginger snaps, animal crackers, hard candy, popsicles, and low-fat/nonfat frozen yogurt. Avoid pastries, cakes, pies and cookies.  Exercise.  A regular program helps decrease LDL and raises HDL.  Helps with weight control.  Do things that increase your activity level like gardening, walking, or taking the stairs.  Medication.  May be prescribed by your caregiver to help lowering cholesterol and the risk for heart disease.  You may need medicine even if your levels are normal if you have several risk factors. HOME CARE INSTRUCTIONS   Follow your diet and exercise programs as suggested by your caregiver.  Take medications as directed.  Have blood work done when your caregiver feels it is necessary. MAKE SURE YOU:   Understand these instructions.  Will watch your condition.  Will get help right away if you are not doing well or get worse. Document Released: 11/20/2000 Document Revised: 05/20/2011 Document Reviewed: 05/13/2007 [facility] Patient Information 2014 [place], [place].

## 2012-08-12 NOTE — Telephone Encounter (Signed)
° °   Next exam annual exam with labs prior to visit lipid, renal, psa, tsh, liver, cbc, hgba1c   Patient has appointment in dec/2014 and will be going to [facility] lab

## 2012-08-12 NOTE — Telephone Encounter (Signed)
Labs ordered.

## 2012-08-13 LAB — URINE CULTURE: Colony Count: NO GROWTH

## 2012-08-13 NOTE — Progress Notes (Signed)
Quick Note:  Patient Informed and voiced understanding ______ 

## 2012-10-14 ENCOUNTER — Other Ambulatory Visit: Payer: Self-pay

## 2012-12-09 ENCOUNTER — Ambulatory Visit (INDEPENDENT_AMBULATORY_CARE_PROVIDER_SITE_OTHER): Payer: Medicare Other

## 2012-12-09 DIAGNOSIS — Z23 Encounter for immunization: Secondary | ICD-10-CM

## 2013-02-02 DIAGNOSIS — H35729 Serous detachment of retinal pigment epithelium, unspecified eye: Secondary | ICD-10-CM | POA: Diagnosis not present

## 2013-02-02 DIAGNOSIS — H35719 Central serous chorioretinopathy, unspecified eye: Secondary | ICD-10-CM | POA: Diagnosis not present

## 2013-02-02 DIAGNOSIS — H3530 Unspecified macular degeneration: Secondary | ICD-10-CM | POA: Diagnosis not present

## 2013-02-08 ENCOUNTER — Ambulatory Visit (INDEPENDENT_AMBULATORY_CARE_PROVIDER_SITE_OTHER): Payer: Medicare Other | Admitting: Family

## 2013-02-08 ENCOUNTER — Encounter: Payer: Self-pay | Admitting: Family

## 2013-02-08 VITALS — BP 136/80 | HR 84 | Temp 98.3°F | Resp 16 | Ht 65.25 in | Wt 175.0 lb

## 2013-02-08 DIAGNOSIS — H6120 Impacted cerumen, unspecified ear: Secondary | ICD-10-CM | POA: Diagnosis not present

## 2013-02-08 DIAGNOSIS — R05 Cough: Secondary | ICD-10-CM | POA: Diagnosis not present

## 2013-02-08 DIAGNOSIS — H6123 Impacted cerumen, bilateral: Secondary | ICD-10-CM

## 2013-02-08 MED ORDER — FLUTICASONE PROPIONATE 50 MCG/ACT NA SUSP: 2.0000 | Freq: Every day | NASAL | Status: DC

## 2013-02-08 NOTE — Assessment & Plan Note (Signed)
Ceruminosis is noted.  Wax is removed by syringing and manual debridement. Instructions for home care to prevent wax buildup are given.  

## 2013-02-08 NOTE — Progress Notes (Signed)
Subjective:    Patient ID: Anthony [name], male    DOB: 1945/05/18, 67 y.o.   MRN: [id number]  HPI  Anthony [name] is a 67 yr old male who presents today with chief complaint of cough. Cough started 5 days ago and is dry. He denies post nasal drip, itching eyes or sneezing.  He denies associated fever.  He reports some associated ringing in the right ear x 4-5 months.  Feels "stopped up" but better the last few days than it has been. Reports energy is ok.   Review of Systems See HPI  Past Medical History  Diagnosis Date  . GERD (gastroesophageal reflux disease)   . Hyperlipidemia   . Hypertension     8-10 years  . Cataract   . CAD (coronary artery disease) March 2011    with a non-ST elevation myocardial infarction. cardiac catherization whic revealed-left main was normal. The LAD had proximal calcification. There was long proximal 30% stenosis. Circumflex inthe AV groove had  proximal  60% stenosis at a mid obtuse marginal. This first large mid obtuse marginal had ostial 70% stenosis. The right coronary artery had a proximal long 50% stenosis.   . Kidney stone   . External hemorrhoids     large external  . Kidney stones 07/20/2012    History   Social History  . Marital Status: Married    Spouse Name: N/A    Number of Children: N/A  . Years of Education: N/A   Occupational History  . Not on file.   Social History Main Topics  . Smoking status: Former Games developer  . Smokeless tobacco: Never Used     Comment: 40 pack year history  . Alcohol Use: No  . Drug Use: No  . Sexual Activity: Not on file   Other Topics Concern  . Not on file   Social History Narrative   married -43 yrs  Myriam Jacobson [place])   grew up in [place]   He has one son - two grandaugthers     He is retired.  He had     a 40 pack-year smoking history but has discontinued.     prev occuptation -  [employer]   Alcohol use-no   Smoking Status:  quit   Packs/Day:  0.5   Caffeine use/day:  2  beverages daily   Does Patient Exercise:  yes             Past Surgical History  Procedure Laterality Date  . Cataract extraction    . Coronary angioplasty with stent placement    . Colonoscopy  2009    [facility]    Family History  Problem Relation Age of Onset  . Coronary artery disease Father     in his 80's  . Heart disease Mother     deceased of heart disease  . Heart disease Sister      2  older sisters  . Other Neg Hx     no lung cancer, colon cancer, or prostate    No Known Allergies  Current Outpatient Prescriptions on File Prior to Visit  Medication Sig Dispense Refill  . amLODipine (NORVASC) 10 MG tablet Take 1 tablet (10 mg total) by mouth daily.  90 tablet  3  . aspirin 81 MG tablet Take 81 mg by mouth daily.       Marland Kitchen losartan (COZAAR) 100 MG tablet Take 1 tablet (100 mg total) by mouth daily.  90 tablet  3  .  metoprolol tartrate (LOPRESSOR) 25 MG tablet 1/2 tablet by mouth twice a day  90 tablet  4  . Multiple Vitamin (MULTIVITAMIN) tablet Take 1 tablet by mouth daily.        . nitroGLYCERIN (NITROSTAT) 0.4 MG SL tablet Place 1 tablet (0.4 mg total) under the tongue every 5 (five) minutes as needed.  25 tablet  12  . rosuvastatin (CRESTOR) 40 MG tablet Take 1 tablet (40 mg total) by mouth daily.  90 tablet  4   No current facility-administered medications on file prior to visit.    BP 136/80  Pulse 84  Temp(Src) 98.3 F (36.8 C) (Oral)  Resp 16  Ht 5' 5.25" (1.657 m)  Wt 175 lb (79.379 kg)  BMI 28.91 kg/m2  SpO2 97%       Objective:   Physical Exam  Constitutional: He is oriented to person, place, and time. He appears well-developed and well-nourished. No distress.  HENT:  Head: Normocephalic and atraumatic.  Bilateral cerumen impaction After removal normal R TM is noted.  L TM remains partially occluded by deep impacted cerumen  Cardiovascular: Normal rate and regular rhythm.   No murmur heard. Pulmonary/Chest: Effort normal and breath  sounds normal. No respiratory distress. He has no wheezes. He has no rales. He exhibits no tenderness.  Musculoskeletal: He exhibits no edema.  Lymphadenopathy:    He has no cervical adenopathy.  Neurological: He is alert and oriented to person, place, and time.  Skin: Skin is warm.  Psychiatric: He has a normal mood and affect. His behavior is normal. Judgment and thought content normal.          Assessment & Plan:

## 2013-02-08 NOTE — Assessment & Plan Note (Signed)
No clear infectious etiology noted.  Advised pt re: to start claritin and flonase.  Call if symptoms worsen or if not improved in 2-3 days. Can consider rx with abx at that time.

## 2013-02-08 NOTE — Patient Instructions (Signed)
Start flonase nasal spray. Start claritin once daily. Call if symptoms worsen or if not improved in 2-3 days.

## 2013-02-12 ENCOUNTER — Telehealth: Payer: Self-pay | Admitting: *Deleted

## 2013-02-12 MED ORDER — AZITHROMYCIN 250 MG PO TABS: ORAL_TABLET | ORAL | Status: DC

## 2013-02-12 NOTE — Telephone Encounter (Signed)
Spoke pt. Advised him to start zpak, call if not improved in 2-3 days.  He verbalizes understanding.

## 2013-02-12 NOTE — Telephone Encounter (Signed)
Received message from pt that his cough still persists and he was told to call us back if it did not get better.  Please advise.

## 2013-02-18 ENCOUNTER — Ambulatory Visit: Payer: Medicare Other | Admitting: Family Medicine

## 2013-02-24 ENCOUNTER — Ambulatory Visit (INDEPENDENT_AMBULATORY_CARE_PROVIDER_SITE_OTHER): Payer: Medicare Other | Admitting: Cardiology

## 2013-02-24 ENCOUNTER — Encounter: Payer: Self-pay | Admitting: Cardiology

## 2013-02-24 ENCOUNTER — Encounter: Payer: Self-pay | Admitting: *Deleted

## 2013-02-24 VITALS — BP 138/80 | HR 74 | Ht 65.0 in | Wt 173.0 lb

## 2013-02-24 DIAGNOSIS — I2510 Atherosclerotic heart disease of native coronary artery without angina pectoris: Secondary | ICD-10-CM

## 2013-02-24 DIAGNOSIS — I10 Essential (primary) hypertension: Secondary | ICD-10-CM

## 2013-02-24 DIAGNOSIS — I679 Cerebrovascular disease, unspecified: Secondary | ICD-10-CM | POA: Diagnosis not present

## 2013-02-24 MED ORDER — METOPROLOL TARTRATE 25 MG PO TABS: ORAL_TABLET | ORAL | Status: DC

## 2013-02-24 MED ORDER — ROSUVASTATIN CALCIUM 40 MG PO TABS: 40.0000 mg | ORAL_TABLET | Freq: Every day | ORAL | Status: DC

## 2013-02-24 NOTE — Assessment & Plan Note (Signed)
Continue aspirin and statin. Schedule followup carotid Dopplers. 

## 2013-02-24 NOTE — Patient Instructions (Signed)

## 2013-02-24 NOTE — Assessment & Plan Note (Signed)
Continue present blood pressure medications. 

## 2013-02-24 NOTE — Progress Notes (Signed)
HPI: FU CAD; admitted in March 2011 with a non-ST elevation myocardial infarction. He underwent cardiac catheterization which revealed - Left main was normal. The LAD had proximal calcification. There was long proximal 30% stenosis. Circumflex in the AV groove had proximal 60% stenosis at a mid obtuse marginal. This first large mid obtuse marginal had ostial 70% stenosis. The right coronary artery had a proximal long 50% stenosis. There was mid subtotal stenosis. The EF was 50% with inferior hypokinesis. Patient had a drug-eluting stent to the right coronary artery at that time. Abdominal ultrasound in July of 2011 showed no aneurysm. Nuclear study in December of 2013 showed an ejection fraction of 71% and normal perfusion. Carotid Dopplers in December of 2013 showed a 40-59% left and 0-39% right stenosis. Followup recommended in one year. I last saw him in Dec of 2013. Since then the patient denies any dyspnea on exertion, orthopnea, PND, pedal edema, palpitations, syncope or chest pain.   Current Outpatient Prescriptions  Medication Sig Dispense Refill  . amLODipine (NORVASC) 10 MG tablet Take 1 tablet (10 mg total) by mouth daily.  90 tablet  3  . aspirin 81 MG tablet Take 81 mg by mouth daily.       . fluticasone (FLONASE) 50 MCG/ACT nasal spray Place 2 sprays into both nostrils daily.  16 g  0  . losartan (COZAAR) 100 MG tablet Take 1 tablet (100 mg total) by mouth daily.  90 tablet  3  . metoprolol tartrate (LOPRESSOR) 25 MG tablet 1/2 tablet by mouth twice a day  90 tablet  4  . Multiple Vitamin (MULTIVITAMIN) tablet Take 1 tablet by mouth daily.        . nitroGLYCERIN (NITROSTAT) 0.4 MG SL tablet Place 1 tablet (0.4 mg total) under the tongue every 5 (five) minutes as needed.  25 tablet  12  . rosuvastatin (CRESTOR) 40 MG tablet Take 1 tablet (40 mg total) by mouth daily.  90 tablet  4   No current facility-administered medications for this visit.     Past Medical History    Diagnosis Date  . GERD (gastroesophageal reflux disease)   . Hyperlipidemia   . Hypertension     8-10 years  . Cataract   . CAD (coronary artery disease) March 2011    with a non-ST elevation myocardial infarction. cardiac catherization whic revealed-left main was normal. The LAD had proximal calcification. There was long proximal 30% stenosis. Circumflex inthe AV groove had  proximal  60% stenosis at a mid obtuse marginal. This first large mid obtuse marginal had ostial 70% stenosis. The right coronary artery had a proximal long 50% stenosis.   . Kidney stone   . External hemorrhoids     large external  . Kidney stones 07/20/2012    Past Surgical History  Procedure Laterality Date  . Cataract extraction    . Coronary angioplasty with stent placement    . Colonoscopy  2009    [facility]    History   Social History  . Marital Status: Married    Spouse Name: N/A    Number of Children: N/A  . Years of Education: N/A   Occupational History  . Not on file.   Social History Main Topics  . Smoking status: Former Games developer  . Smokeless tobacco: Never Used     Comment: 40 pack year history  . Alcohol Use: No  . Drug Use: No  . Sexual Activity: Not on file  Other Topics Concern  . Not on file   Social History Narrative   married -43 yrs  Myriam Jacobson [place])   grew up in [place]   He has one son - two grandaugthers     He is retired.  He had     a 40 pack-year smoking history but has discontinued.     prev occuptation -  [employer]   Alcohol use-no   Smoking Status:  quit   Packs/Day:  0.5   Caffeine use/day:  2 beverages daily   Does Patient Exercise:  yes             ROS: no fevers or chills, productive cough, hemoptysis, dysphasia, odynophagia, melena, hematochezia, dysuria, hematuria, rash, seizure activity, orthopnea, PND, pedal edema, claudication. Remaining systems are negative.  Physical Exam: Well-developed well-nourished in no acute distress.   Skin is warm and dry.  HEENT is normal.  Neck is supple.  Chest is clear to auscultation with normal expansion.  Cardiovascular exam is regular rate and rhythm.  Abdominal exam nontender or distended. No masses palpated. Extremities show no edema. neuro grossly intact  ECG sinus rhythm at a rate of 74. No ST changes.

## 2013-02-24 NOTE — Assessment & Plan Note (Signed)
Continue aspirin and statin. 

## 2013-02-24 NOTE — Assessment & Plan Note (Signed)
Continue statin. 

## 2013-03-09 ENCOUNTER — Ambulatory Visit (HOSPITAL_COMMUNITY): Payer: Medicare Other | Attending: Cardiovascular Disease

## 2013-03-09 ENCOUNTER — Encounter: Payer: Self-pay | Admitting: Cardiovascular Disease

## 2013-03-09 DIAGNOSIS — I10 Essential (primary) hypertension: Secondary | ICD-10-CM | POA: Insufficient documentation

## 2013-03-09 DIAGNOSIS — I679 Cerebrovascular disease, unspecified: Secondary | ICD-10-CM

## 2013-03-09 DIAGNOSIS — I2510 Atherosclerotic heart disease of native coronary artery without angina pectoris: Secondary | ICD-10-CM | POA: Diagnosis not present

## 2013-03-09 DIAGNOSIS — Z87891 Personal history of nicotine dependence: Secondary | ICD-10-CM | POA: Insufficient documentation

## 2013-03-09 DIAGNOSIS — R0989 Other specified symptoms and signs involving the circulatory and respiratory systems: Secondary | ICD-10-CM | POA: Diagnosis not present

## 2013-03-09 DIAGNOSIS — E785 Hyperlipidemia, unspecified: Secondary | ICD-10-CM | POA: Diagnosis not present

## 2013-03-09 DIAGNOSIS — I6529 Occlusion and stenosis of unspecified carotid artery: Secondary | ICD-10-CM | POA: Insufficient documentation

## 2013-03-24 ENCOUNTER — Other Ambulatory Visit: Payer: Self-pay | Admitting: Family Medicine

## 2013-03-24 DIAGNOSIS — N138 Other obstructive and reflux uropathy: Secondary | ICD-10-CM | POA: Diagnosis not present

## 2013-03-24 DIAGNOSIS — R7309 Other abnormal glucose: Secondary | ICD-10-CM | POA: Diagnosis not present

## 2013-03-24 DIAGNOSIS — I10 Essential (primary) hypertension: Secondary | ICD-10-CM | POA: Diagnosis not present

## 2013-03-24 DIAGNOSIS — N401 Enlarged prostate with lower urinary tract symptoms: Secondary | ICD-10-CM | POA: Diagnosis not present

## 2013-03-24 DIAGNOSIS — E785 Hyperlipidemia, unspecified: Secondary | ICD-10-CM | POA: Diagnosis not present

## 2013-03-24 LAB — HEMOGLOBIN A1C
HEMOGLOBIN A1C: 6.1 % — AB (ref <5.7)
Mean Plasma Glucose: 128 mg/dL — ABNORMAL HIGH (ref <117)

## 2013-03-24 LAB — TSH: TSH: 2.892 u[IU]/mL (ref 0.350–4.500)

## 2013-03-24 LAB — LIPID PANEL
Cholesterol: 101 mg/dL (ref 0–200)
HDL: 35 mg/dL — ABNORMAL LOW (ref >39)
LDL Cholesterol: 22 mg/dL (ref 0–99)
TRIGLYCERIDES: 219 mg/dL — AB (ref <150)
Total CHOL/HDL Ratio: 2.9 Ratio
VLDL: 44 mg/dL — ABNORMAL HIGH (ref 0–40)

## 2013-03-24 LAB — CBC
HCT: 41.5 % (ref 39.0–52.0)
HEMOGLOBIN: 14 g/dL (ref 13.0–17.0)
MCH: 31.3 pg (ref 26.0–34.0)
MCHC: 33.7 g/dL (ref 30.0–36.0)
MCV: 92.8 fL (ref 78.0–100.0)
Platelets: 240 10*3/uL (ref 150–400)
RBC: 4.47 MIL/uL (ref 4.22–5.81)
RDW: 13.5 % (ref 11.5–15.5)
WBC: 7.9 10*3/uL (ref 4.0–10.5)

## 2013-03-24 LAB — RENAL FUNCTION PANEL
ALBUMIN: 4 g/dL (ref 3.5–5.2)
BUN: 13 mg/dL (ref 6–23)
CALCIUM: 9 mg/dL (ref 8.4–10.5)
CO2: 27 mEq/L (ref 19–32)
CREATININE: 0.83 mg/dL (ref 0.50–1.35)
Chloride: 103 mEq/L (ref 96–112)
Glucose, Bld: 86 mg/dL (ref 70–99)
Phosphorus: 3.2 mg/dL (ref 2.3–4.6)
Potassium: 4 mEq/L (ref 3.5–5.3)
Sodium: 140 mEq/L (ref 135–145)

## 2013-03-24 LAB — HEPATIC FUNCTION PANEL
ALBUMIN: 4 g/dL (ref 3.5–5.2)
ALT: 20 U/L (ref 0–53)
AST: 18 U/L (ref 0–37)
Alkaline Phosphatase: 74 U/L (ref 39–117)
BILIRUBIN TOTAL: 0.6 mg/dL (ref 0.3–1.2)
Bilirubin, Direct: 0.1 mg/dL (ref 0.0–0.3)
Indirect Bilirubin: 0.5 mg/dL (ref 0.0–0.9)
Total Protein: 6.8 g/dL (ref 6.0–8.3)

## 2013-03-24 LAB — PSA: PSA: 3.32 ng/mL (ref <=4.00)

## 2013-03-26 ENCOUNTER — Ambulatory Visit: Payer: Medicare Other | Admitting: Family Medicine

## 2013-03-29 ENCOUNTER — Ambulatory Visit: Payer: Medicare Other | Admitting: Family Medicine

## 2013-04-02 ENCOUNTER — Encounter: Payer: Self-pay | Admitting: Family Medicine

## 2013-04-02 ENCOUNTER — Ambulatory Visit (INDEPENDENT_AMBULATORY_CARE_PROVIDER_SITE_OTHER): Payer: Medicare Other | Admitting: Family Medicine

## 2013-04-02 VITALS — BP 150/70 | HR 72 | Temp 97.7°F | Ht 65.0 in | Wt 176.0 lb

## 2013-04-02 DIAGNOSIS — N200 Calculus of kidney: Secondary | ICD-10-CM

## 2013-04-02 DIAGNOSIS — I10 Essential (primary) hypertension: Secondary | ICD-10-CM | POA: Diagnosis not present

## 2013-04-02 DIAGNOSIS — N138 Other obstructive and reflux uropathy: Secondary | ICD-10-CM

## 2013-04-02 DIAGNOSIS — R3911 Hesitancy of micturition: Secondary | ICD-10-CM | POA: Diagnosis not present

## 2013-04-02 DIAGNOSIS — Z86010 Personal history of colonic polyps: Secondary | ICD-10-CM

## 2013-04-02 DIAGNOSIS — Z0000 Encounter for general adult medical examination without abnormal findings: Secondary | ICD-10-CM | POA: Diagnosis not present

## 2013-04-02 DIAGNOSIS — N401 Enlarged prostate with lower urinary tract symptoms: Secondary | ICD-10-CM

## 2013-04-02 DIAGNOSIS — E785 Hyperlipidemia, unspecified: Secondary | ICD-10-CM | POA: Diagnosis not present

## 2013-04-02 MED ORDER — AMLODIPINE BESYLATE 10 MG PO TABS: 10.0000 mg | ORAL_TABLET | Freq: Every day | ORAL | Status: DC

## 2013-04-02 MED ORDER — LOSARTAN POTASSIUM 100 MG PO TABS: 100.0000 mg | ORAL_TABLET | Freq: Every day | ORAL | Status: DC

## 2013-04-02 NOTE — Progress Notes (Signed)
Patient ID: Anthony [name], male   DOB: January 12, 1946, 68 y.o.   MRN: [id number] TABIAS SWAYZE [id number] 05-May-1945 04/02/2013      Progress Note-Follow Up  Subjective  Chief Complaint  Chief Complaint  Patient presents with  . Annual Exam    Medicare Wellness    HPI  Patient is a 68 year old male who is in today for followup. Had a recent episode of passing kidney stones reports symptoms have improved. His only ongoing complaint is of some nocturia. Denies dysuria. Does have some intermittent left ear pain throughout the day and better with rest. No radicular symptoms or incontinence. No chest pain, palpitations or shortness of breath. No GI complaints. Taking medications as prescribed. Denies depression or any difficulties with ADLs or recent falls  Past Medical History  Diagnosis Date  . GERD (gastroesophageal reflux disease)   . Hyperlipidemia   . Hypertension     8-10 years  . Cataract   . CAD (coronary artery disease) March 2011    with a non-ST elevation myocardial infarction. cardiac catherization whic revealed-left main was normal. The LAD had proximal calcification. There was long proximal 30% stenosis. Circumflex inthe AV groove had  proximal  60% stenosis at a mid obtuse marginal. This first large mid obtuse marginal had ostial 70% stenosis. The right coronary artery had a proximal long 50% stenosis.   . Kidney stone   . External hemorrhoids     large external  . Kidney stones 07/20/2012    Past Surgical History  Procedure Laterality Date  . Cataract extraction    . Coronary angioplasty with stent placement    . Colonoscopy  2009    [facility]    Family History  Problem Relation Age of Onset  . Coronary artery disease Father     in his 73's  . Heart disease Mother     deceased of heart disease  . Heart disease Sister      2  older sisters  . Other Neg Hx     no lung cancer, colon cancer, or prostate    History   Social History  . Marital Status:  Married    Spouse Name: N/A    Number of Children: N/A  . Years of Education: N/A   Occupational History  . Not on file.   Social History Main Topics  . Smoking status: Former Research scientist (life sciences)  . Smokeless tobacco: Never Used     Comment: 40 pack year history  . Alcohol Use: No  . Drug Use: No  . Sexual Activity: Not on file   Other Topics Concern  . Not on file   Social History Narrative   married -12 yrs  [name] [place])   grew up in [place]   He has one son - two grandaugthers     He is retired.  He had     a 40 pack-year smoking history but has discontinued.     prev occuptation -  [employer]   Alcohol use-no   Smoking Status:  quit   Packs/Day:  0.5   Caffeine use/day:  2 beverages daily   Does Patient Exercise:  yes             Current Outpatient Prescriptions on File Prior to Visit  Medication Sig Dispense Refill  . aspirin 81 MG tablet Take 81 mg by mouth daily.       . metoprolol tartrate (LOPRESSOR) 25 MG tablet 1/2 tablet by mouth twice a day  90 tablet  4  . Multiple Vitamin (MULTIVITAMIN) tablet Take 1 tablet by mouth daily.        . nitroGLYCERIN (NITROSTAT) 0.4 MG SL tablet Place 1 tablet (0.4 mg total) under the tongue every 5 (five) minutes as needed.  25 tablet  12  . rosuvastatin (CRESTOR) 40 MG tablet Take 1 tablet (40 mg total) by mouth daily.  90 tablet  4   No current facility-administered medications on file prior to visit.    No Known Allergies  Review of Systems  Review of Systems  Constitutional: Negative for fever and malaise/fatigue.  HENT: Negative for congestion.   Eyes: Negative for discharge.  Respiratory: Negative for shortness of breath.   Cardiovascular: Negative for chest pain, palpitations and leg swelling.  Gastrointestinal: Negative for nausea, abdominal pain and diarrhea.  Genitourinary: Positive for urgency and frequency. Negative for dysuria, hematuria and flank pain.  Musculoskeletal: Positive for joint pain.  Negative for falls.       Left hip pain  Skin: Negative for rash.  Neurological: Negative for loss of consciousness and headaches.  Endo/Heme/Allergies: Negative for polydipsia.  Psychiatric/Behavioral: Negative for depression and suicidal ideas. The patient is not nervous/anxious and does not have insomnia.     Objective  BP 150/70  Pulse 72  Temp(Src) 97.7 F (36.5 C) (Oral)  Ht 5\' 5"  (1.651 m)  Wt 176 lb (79.833 kg)  BMI 29.29 kg/m2  SpO2 97%  Physical Exam  Physical Exam  Constitutional: He is oriented to person, place, and time and well-developed, well-nourished, and in no distress. No distress.  HENT:  Head: Normocephalic and atraumatic.  Eyes: Conjunctivae are normal.  Neck: Neck supple. No thyromegaly present.  Cardiovascular: Normal rate, regular rhythm and normal heart sounds.   No murmur heard. Pulmonary/Chest: Effort normal and breath sounds normal. No respiratory distress.  Abdominal: He exhibits no distension and no mass. There is no tenderness.  Musculoskeletal: He exhibits no edema.  Neurological: He is alert and oriented to person, place, and time.  Skin: Skin is warm.  Psychiatric: Memory, affect and judgment normal.   Lab Results  Component Value Date   TSH 2.892 03/24/2013   Lab Results  Component Value Date   WBC 7.9 03/24/2013   HGB 14.0 03/24/2013   HCT 41.5 03/24/2013   MCV 92.8 03/24/2013   PLT 240 03/24/2013   Lab Results  Component Value Date   CREATININE 0.83 03/24/2013   BUN 13 03/24/2013   NA 140 03/24/2013   K 4.0 03/24/2013   CL 103 03/24/2013   CO2 27 03/24/2013   Lab Results  Component Value Date   ALT 20 03/24/2013   AST 18 03/24/2013   ALKPHOS 74 03/24/2013   BILITOT 0.6 03/24/2013   Lab Results  Component Value Date   CHOL 101 03/24/2013   Lab Results  Component Value Date   HDL 35* 03/24/2013   Lab Results  Component Value Date   LDLCALC 22 03/24/2013   Lab Results  Component Value Date   TRIG 219* 03/24/2013   Lab  Results  Component Value Date   CHOLHDL 2.9 03/24/2013     Assessment & Plan  HYPERTENSION Mild elevation, minimize sodium and caffeine. Continue current meds  HYPERLIPIDEMIA Tolerating crestor, minimize trans fats  BENIGN PROSTATIC HYPERTROPHY, WITH URINARY OBSTRUCTION Nocturia, check psa prior to next visit.  Kidney stones Had a recent episode of passing stones, now feels well, consider referral to urology if recurs  Personal history of  colonic polyps Patient reports h/o numerous colon polyps, was previously on a 3 year cycle now has moved to 5 years. Last colonoscopy with [facility] but he would like to proceed with new GI. Will request old records and refer.   Encounter for Medicare annual wellness exam No depression or falls, no recent change in vision or hearing, no advanced directives, encouraged to develop them. Referred for repeat colonoscopy. Reviewed labs

## 2013-04-02 NOTE — Progress Notes (Signed)
Pre visit review using our clinic review tool, if applicable. No additional management support is needed unless otherwise documented below in the visit note. 

## 2013-04-03 LAB — URINALYSIS
Bilirubin Urine: NEGATIVE
Glucose, UA: NEGATIVE mg/dL
KETONES UR: NEGATIVE mg/dL
Leukocytes, UA: NEGATIVE
Nitrite: NEGATIVE
PROTEIN: NEGATIVE mg/dL
Specific Gravity, Urine: 1.012 (ref 1.005–1.030)
UROBILINOGEN UA: 0.2 mg/dL (ref 0.0–1.0)
pH: 7.5 (ref 5.0–8.0)

## 2013-04-03 LAB — URINE CULTURE
Colony Count: NO GROWTH
Organism ID, Bacteria: NO GROWTH

## 2013-04-05 ENCOUNTER — Encounter: Payer: Self-pay | Admitting: Family Medicine

## 2013-04-05 DIAGNOSIS — Z86010 Personal history of colonic polyps: Secondary | ICD-10-CM | POA: Insufficient documentation

## 2013-04-05 NOTE — Assessment & Plan Note (Signed)
No depression or falls, no recent change in vision or hearing, no advanced directives, encouraged to develop them. Referred for repeat colonoscopy. Reviewed labs

## 2013-04-05 NOTE — Assessment & Plan Note (Signed)
Mild elevation, minimize sodium and caffeine. Continue current meds

## 2013-04-05 NOTE — Assessment & Plan Note (Signed)
Nocturia, check psa prior to next visit.

## 2013-04-05 NOTE — Assessment & Plan Note (Signed)
Had a recent episode of passing stones, now feels well, consider referral to urology if recurs

## 2013-04-05 NOTE — Assessment & Plan Note (Signed)
Patient reports h/o numerous colon polyps, was previously on a 3 year cycle now has moved to 5 years. Last colonoscopy with [facility] but he would like to proceed with new GI. Will request old records and refer.

## 2013-04-05 NOTE — Assessment & Plan Note (Signed)
Tolerating crestor, minimize trans fats

## 2013-04-14 ENCOUNTER — Encounter: Payer: Self-pay | Admitting: Internal Medicine

## 2013-04-21 ENCOUNTER — Encounter: Payer: Self-pay | Admitting: Family

## 2013-04-21 ENCOUNTER — Ambulatory Visit (INDEPENDENT_AMBULATORY_CARE_PROVIDER_SITE_OTHER): Payer: Medicare Other | Admitting: Family

## 2013-04-21 VITALS — BP 124/70 | HR 77 | Temp 97.5°F | Resp 16 | Ht 65.25 in | Wt 175.1 lb

## 2013-04-21 DIAGNOSIS — IMO0002 Reserved for concepts with insufficient information to code with codable children: Secondary | ICD-10-CM | POA: Diagnosis not present

## 2013-04-21 MED ORDER — CEPHALEXIN 500 MG PO CAPS: 500.0000 mg | ORAL_CAPSULE | Freq: Two times a day (BID) | ORAL | Status: DC

## 2013-04-21 NOTE — Progress Notes (Signed)
Subjective:    Patient ID: Anthony [name], male    DOB: [birth year], 68 y.o.   MRN: [id number]  HPI  Anthony [name] is a 68 yr old male who presents today with pain left thumbnail medially. Reports +soreness.  Started about 2 weeks ago.  Review of Systems See HPI  Past Medical History  Diagnosis Date  . GERD (gastroesophageal reflux disease)   . Hyperlipidemia   . Hypertension     8-10 years  . Cataract   . CAD (coronary artery disease) March 2011    with a non-ST elevation myocardial infarction. cardiac catherization whic revealed-left main was normal. The LAD had proximal calcification. There was long proximal 30% stenosis. Circumflex inthe AV groove had  proximal  60% stenosis at a mid obtuse marginal. This first large mid obtuse marginal had ostial 70% stenosis. The right coronary artery had a proximal long 50% stenosis.   . Kidney stone   . External hemorrhoids     large external  . Kidney stones 07/20/2012  . Personal history of colonic polyps 04/05/2013    History   Social History  . Marital Status: Married    Spouse Name: N/A    Number of Children: N/A  . Years of Education: N/A   Occupational History  . Not on file.   Social History Main Topics  . Smoking status: Former Research scientist (life sciences)  . Smokeless tobacco: Never Used     Comment: 40 pack year history  . Alcohol Use: No  . Drug Use: No  . Sexual Activity: Not on file   Other Topics Concern  . Not on file   Social History Narrative   married -75 yrs  [name] [place])   grew up in [place]   He has one son - two grandaugthers     He is retired.  He had     a 40 pack-year smoking history but has discontinued.     prev occuptation -  [employer]   Alcohol use-no   Smoking Status:  quit   Packs/Day:  0.5   Caffeine use/day:  2 beverages daily   Does Patient Exercise:  yes             Past Surgical History  Procedure Laterality Date  . Cataract extraction    . Coronary angioplasty with stent  placement    . Colonoscopy  2009    [facility]    Family History  Problem Relation Age of Onset  . Coronary artery disease Father     in his 51's  . Heart disease Mother     deceased of heart disease  . Heart disease Sister      2  older sisters  . Other Neg Hx     no lung cancer, colon cancer, or prostate    No Known Allergies  Current Outpatient Prescriptions on File Prior to Visit  Medication Sig Dispense Refill  . amLODipine (NORVASC) 10 MG tablet Take 1 tablet (10 mg total) by mouth daily.  90 tablet  3  . aspirin 81 MG tablet Take 81 mg by mouth daily.       . fluticasone (FLONASE) 50 MCG/ACT nasal spray       . losartan (COZAAR) 100 MG tablet Take 1 tablet (100 mg total) by mouth daily.  90 tablet  3  . metoprolol tartrate (LOPRESSOR) 25 MG tablet 1/2 tablet by mouth twice a day  90 tablet  4  . Multiple Vitamin (MULTIVITAMIN) tablet  Take 1 tablet by mouth daily.        . nitroGLYCERIN (NITROSTAT) 0.4 MG SL tablet Place 1 tablet (0.4 mg total) under the tongue every 5 (five) minutes as needed.  25 tablet  12  . rosuvastatin (CRESTOR) 40 MG tablet Take 1 tablet (40 mg total) by mouth daily.  90 tablet  4   No current facility-administered medications on file prior to visit.    BP 124/70  Pulse 77  Temp(Src) 97.5 F (36.4 C) (Oral)  Resp 16  Ht 5' 5.25" (1.657 m)  Wt 175 lb 1.3 oz (79.416 kg)  BMI 28.92 kg/m2  SpO2 97%       Objective:   Physical Exam  Constitutional: He is oriented to person, place, and time. He appears well-developed and well-nourished. No distress.  Neurological: He is alert and oriented to person, place, and time.  Skin: Skin is warm and dry.  + scabbing/mild erythema/tenderness left thumb nail bed medially          Assessment & Plan:

## 2013-04-21 NOTE — Assessment & Plan Note (Signed)
Recommended keflex, warm soaks at least bid. Call if symptoms worsen or if symptoms are not resolved in 1 week.

## 2013-04-21 NOTE — Progress Notes (Signed)
Pre visit review using our clinic review tool, if applicable. No additional management support is needed unless otherwise documented below in the visit note. 

## 2013-04-21 NOTE — Patient Instructions (Signed)
Soak left thumb in warm water twice daily. Start Keflex (antibiotics). Call if symptoms worsen, or if symptoms are not resolved in 1 week.   Paronychia Paronychia is an inflammatory reaction involving the folds of the skin surrounding the fingernail. This is commonly caused by an infection in the skin around a nail. The most common cause of paronychia is frequent wetting of the hands (as seen with bartenders, food servers, nurses or others who wet their hands). This makes the skin around the fingernail susceptible to infection by bacteria (germs) or fungus. Other predisposing factors are:  Aggressive manicuring.  Nail biting.  Thumb sucking. The most common cause is a staphylococcal (a type of germ) infection, or a fungal (Candida) infection. When caused by a germ, it usually comes on suddenly with redness, swelling, pus and is often painful. It may get under the nail and form an abscess (collection of pus), or form an abscess around the nail. If the nail itself is infected with a fungus, the treatment is usually prolonged and may require oral medicine for up to one year. Your caregiver will determine the length of time treatment is required. The paronychia caused by bacteria (germs) may largely be avoided by not pulling on hangnails or picking at cuticles. When the infection occurs at the tips of the finger it is called felon. When the cause of paronychia is from the herpes simplex virus (HSV) it is called herpetic whitlow. TREATMENT  When an abscess is present treatment is often incision and drainage. This means that the abscess must be cut open so the pus can get out. When this is done, the following home care instructions should be followed. HOME CARE INSTRUCTIONS   It is important to keep the affected fingers very dry. Rubber or plastic gloves over cotton gloves should be used whenever the hand must be placed in water.  Keep wound clean, dry and dressed as suggested by your caregiver between  warm soaks or warm compresses.  Soak in warm water for fifteen to twenty minutes three to four times per day for bacterial infections. Fungal infections are very difficult to treat, so often require treatment for long periods of time.  For bacterial (germ) infections take antibiotics (medicine which kill germs) as directed and finish the prescription, even if the problem appears to be solved before the medicine is gone.  Only take over-the-counter or prescription medicines for pain, discomfort, or fever as directed by your caregiver. SEEK IMMEDIATE MEDICAL CARE IF:  You have redness, swelling, or increasing pain in the wound.  You notice pus coming from the wound.  You have a fever.  You notice a bad smell coming from the wound or dressing. Document Released: 08/21/2000 Document Revised: 05/20/2011 Document Reviewed: 04/22/2008 [facility] Patient Information 2014 [place].

## 2013-05-20 ENCOUNTER — Telehealth: Payer: Self-pay | Admitting: *Deleted

## 2013-05-20 ENCOUNTER — Ambulatory Visit (AMBULATORY_SURGERY_CENTER): Payer: Self-pay | Admitting: *Deleted

## 2013-05-20 VITALS — Ht 65.0 in | Wt 177.4 lb

## 2013-05-20 DIAGNOSIS — Z86010 Personal history of colonic polyps: Secondary | ICD-10-CM

## 2013-05-20 MED ORDER — MOVIPREP 100 G PO SOLR: ORAL | Status: DC

## 2013-05-20 NOTE — Progress Notes (Signed)
No allergies to eggs or soy. No problems with anesthesia.  

## 2013-05-20 NOTE — Telephone Encounter (Signed)
Pt scheduled for colonoscopy with Dr. Hilarie Fredrickson 06/03/13 at 8:00 am.  Last colonoscopy 2009 with Dr Wilford Corner at [facility].  Pt says he had polyps in 2009 and in 2006.  Release of information form signed and given to [facility], [place].

## 2013-05-27 ENCOUNTER — Encounter: Payer: Self-pay | Admitting: Internal Medicine

## 2013-05-27 ENCOUNTER — Ambulatory Visit: Payer: Medicare Other | Admitting: Family Medicine

## 2013-05-27 NOTE — Telephone Encounter (Signed)
Faxed records request to Dr. Michail Sermon. Received records already. On Dr. Garth Schlatter desk

## 2013-06-03 ENCOUNTER — Telehealth: Payer: Self-pay | Admitting: Internal Medicine

## 2013-06-03 ENCOUNTER — Other Ambulatory Visit: Payer: Self-pay | Admitting: Gastroenterology

## 2013-06-03 ENCOUNTER — Ambulatory Visit (AMBULATORY_SURGERY_CENTER): Payer: Medicare Other | Admitting: Internal Medicine

## 2013-06-03 ENCOUNTER — Encounter: Payer: Self-pay | Admitting: Internal Medicine

## 2013-06-03 VITALS — BP 117/65 | HR 69 | Temp 97.8°F | Resp 19 | Ht 65.0 in | Wt 177.0 lb

## 2013-06-03 DIAGNOSIS — I2510 Atherosclerotic heart disease of native coronary artery without angina pectoris: Secondary | ICD-10-CM | POA: Diagnosis not present

## 2013-06-03 DIAGNOSIS — Z86010 Personal history of colonic polyps: Secondary | ICD-10-CM

## 2013-06-03 DIAGNOSIS — K648 Other hemorrhoids: Secondary | ICD-10-CM

## 2013-06-03 DIAGNOSIS — Z1211 Encounter for screening for malignant neoplasm of colon: Secondary | ICD-10-CM

## 2013-06-03 DIAGNOSIS — D126 Benign neoplasm of colon, unspecified: Secondary | ICD-10-CM

## 2013-06-03 DIAGNOSIS — I10 Essential (primary) hypertension: Secondary | ICD-10-CM | POA: Diagnosis not present

## 2013-06-03 DIAGNOSIS — K219 Gastro-esophageal reflux disease without esophagitis: Secondary | ICD-10-CM | POA: Diagnosis not present

## 2013-06-03 MED ORDER — PRAMOXINE-HC 1-1 % EX CREA: TOPICAL_CREAM | Freq: Three times a day (TID) | CUTANEOUS | Status: DC

## 2013-06-03 MED ORDER — HYDROCORTISONE ACE-PRAMOXINE 1-1 % RE CREA: 1.0000 "application " | TOPICAL_CREAM | Freq: Two times a day (BID) | RECTAL | Status: DC

## 2013-06-03 MED ORDER — SODIUM CHLORIDE 0.9 % IV SOLN: 500.0000 mL | INTRAVENOUS | Status: DC

## 2013-06-03 NOTE — Progress Notes (Signed)
Called to room to assist during endoscopic procedure.  Patient ID and intended procedure confirmed with present staff. Received instructions for my participation in the procedure from the performing physician.  

## 2013-06-03 NOTE — Telephone Encounter (Signed)
Pharmacy called and advised to dispense 1% analpram per Dr. Vena Rua order.

## 2013-06-03 NOTE — Patient Instructions (Signed)
YOU HAD AN ENDOSCOPIC PROCEDURE TODAY AT THE [facility]: Refer to the procedure report that was given to you for any specific questions about what was found during the examination.  If the procedure report does not answer your questions, please call your gastroenterologist to clarify.  If you requested that your care partner not be given the details of your procedure findings, then the procedure report has been included in a sealed envelope for you to review at your convenience later.  YOU SHOULD EXPECT: Some feelings of bloating in the abdomen. Passage of more gas than usual.  Walking can help get rid of the air that was put into your GI tract during the procedure and reduce the bloating. If you had a lower endoscopy (such as a colonoscopy or flexible sigmoidoscopy) you may notice spotting of blood in your stool or on the toilet paper. If you underwent a bowel prep for your procedure, then you may not have a normal bowel movement for a few days.  DIET: Your first meal following the procedure should be a light meal and then it is ok to progress to your normal diet.  A half-sandwich or bowl of soup is an example of a good first meal.  Heavy or fried foods are harder to digest and may make you feel nauseous or bloated.  Likewise meals heavy in dairy and vegetables can cause extra gas to form and this can also increase the bloating.  Drink plenty of fluids but you should avoid alcoholic beverages for 24 hours.  ACTIVITY: Your care partner should take you home directly after the procedure.  You should plan to take it easy, moving slowly for the rest of the day.  You can resume normal activity the day after the procedure however you should NOT DRIVE or use heavy machinery for 24 hours (because of the sedation medicines used during the test).    SYMPTOMS TO REPORT IMMEDIATELY: A gastroenterologist can be reached at any hour.  During normal business hours, 8:30 AM to 5:00 PM Monday through Friday,  call [phone].  After hours and on weekends, please call the GI answering service at [phone] who will take a message and have the physician on call contact you.   Following lower endoscopy (colonoscopy or flexible sigmoidoscopy):  Excessive amounts of blood in the stool  Significant tenderness or worsening of abdominal pains  Swelling of the abdomen that is new, acute  Fever of 100F or higher    FOLLOW UP: If any biopsies were taken you will be contacted by phone or by letter within the next 1-3 weeks.  Call your gastroenterologist if you have not heard about the biopsies in 3 weeks.  Our staff will call the home number listed on your records the next business day following your procedure to check on you and address any questions or concerns that you may have at that time regarding the information given to you following your procedure. This is a courtesy call and so if there is no answer at the home number and we have not heard from you through the emergency physician on call, we will assume that you have returned to your regular daily activities without incident.  SIGNATURES/CONFIDENTIALITY: You and/or your care partner have signed paperwork which will be entered into your electronic medical record.  These signatures attest to the fact that that the information above on your After Visit Summary has been reviewed and is understood.  Full responsibility of the confidentiality   of this discharge information lies with you and/or your care-partner.  Polyp and hemorrhoid information given.  Dr. Hilarie Fredrickson will advise when to come back for colonoscopy after pathology report is reviewed.  Analpram three times daily for external hemorrhoids for one week.

## 2013-06-03 NOTE — Op Note (Signed)
[facility]  [address] [place], [id number]   COLONOSCOPY PROCEDURE REPORT  PATIENT: Anthony [name], Anthony [name]  MR#: [id number] BIRTHDATE: 06-May-1945 , 27  yrs. old GENDER: Male ENDOSCOPIST: Jerene Bears, MD REFERRED BW:LSLHT Charlett Blake, M.D. PROCEDURE DATE:  06/03/2013 PROCEDURE:   Colonoscopy with snare polypectomy First Screening Colonoscopy - Avg.  risk and is 50 yrs.  old or older - No.  Prior Negative Screening - Now for repeat screening. N/A  History of Adenoma - Now for follow-up colonoscopy & has been > or = to 3 yrs.  Yes hx of adenoma.  Has been 3 or more years since last colonoscopy.  Polyps Removed Today? Yes. ASA CLASS:   Class III INDICATIONS:Patient's personal history of adenomatous colon polyps and elevated risk screening. MEDICATIONS: MAC sedation, administered by CRNA and propofol (Diprivan) 250mg  IV  DESCRIPTION OF PROCEDURE:   After the risks benefits and alternatives of the procedure were thoroughly explained, informed consent was obtained.  A digital rectal exam revealed external hemorrhoids.   The LB [id number]  endoscope was introduced through the anus and advanced to the cecum, which was identified by both the appendix and ileocecal valve. No adverse events experienced.   The quality of the prep was good, using MoviPrep The instrument was then slowly withdrawn as the colon was fully examined.  COLON FINDINGS: Four sessile polyps ranging between 3-30mm in size were found in the ascending colon and transverse colon. Polypectomy was performed using cold snare.  All resections were complete and all polyp tissue was completely retrieved.   The colon mucosa was otherwise normal.   Large external hemorrhoids were found.  Retroflexed views revealed external hemorrhoids. The time to cecum=4 minutes 18 seconds.  Withdrawal time=12 minutes 48 seconds.  The scope was withdrawn and the procedure completed. COMPLICATIONS: There were no  complications.  ENDOSCOPIC IMPRESSION: 1.   Four sessile polyps ranging between 3-21mm in size were found in the ascending colon and transverse colon; Polypectomy was performed using cold snare 2.   The colon mucosa was otherwise normal 3.   Large external hemorrhoids  RECOMMENDATIONS: 1.  Await pathology results 2.  Timing of repeat colonoscopy will be determined by pathology findings. 3.  You will receive a letter within 1-2 weeks with the results of your biopsy as well as final recommendations.  Please call my office if you have not received a letter after 3 weeks. 4.   Analpram three times daily for external hemorrhoids x 7 days   eSigned:  Jerene Bears, MD 06/03/2013 8:30 AM         cc: The Patient and Willette Alma, MD

## 2013-06-03 NOTE — Progress Notes (Signed)
Lidocaine-40mg IV prior to Propofol InductionPropofol given over incremental dosages 

## 2013-06-04 ENCOUNTER — Telehealth: Payer: Self-pay | Admitting: *Deleted

## 2013-06-04 ENCOUNTER — Telehealth: Payer: Self-pay | Admitting: Internal Medicine

## 2013-06-04 NOTE — Telephone Encounter (Signed)
  Follow up Call-  Call back number 06/03/2013  Post procedure Call Back phone  # [phone]  Permission to leave phone message Yes     Patient questions:  Do you have a fever, pain , or abdominal swelling? no Pain Score  0 *  Have you tolerated food without any problems? yes  Have you been able to return to your normal activities? yes  Do you have any questions about your discharge instructions: Diet   no Medications  no Follow up visit  no  Do you have questions or concerns about your Care? no  Actions: * If pain score is 4 or above: No action needed, pain <4.

## 2013-06-08 ENCOUNTER — Encounter: Payer: Self-pay | Admitting: Internal Medicine

## 2013-08-05 ENCOUNTER — Other Ambulatory Visit: Payer: Self-pay

## 2013-08-05 DIAGNOSIS — D485 Neoplasm of uncertain behavior of skin: Secondary | ICD-10-CM | POA: Diagnosis not present

## 2013-08-05 DIAGNOSIS — L909 Atrophic disorder of skin, unspecified: Secondary | ICD-10-CM | POA: Diagnosis not present

## 2013-08-05 DIAGNOSIS — L821 Other seborrheic keratosis: Secondary | ICD-10-CM | POA: Diagnosis not present

## 2013-08-06 DIAGNOSIS — H3530 Unspecified macular degeneration: Secondary | ICD-10-CM | POA: Diagnosis not present

## 2013-08-06 DIAGNOSIS — H35719 Central serous chorioretinopathy, unspecified eye: Secondary | ICD-10-CM | POA: Diagnosis not present

## 2013-08-06 DIAGNOSIS — H35729 Serous detachment of retinal pigment epithelium, unspecified eye: Secondary | ICD-10-CM | POA: Diagnosis not present

## 2013-08-27 ENCOUNTER — Ambulatory Visit (INDEPENDENT_AMBULATORY_CARE_PROVIDER_SITE_OTHER): Payer: Medicare Other | Admitting: Physician Assistant

## 2013-08-27 ENCOUNTER — Ambulatory Visit (HOSPITAL_BASED_OUTPATIENT_CLINIC_OR_DEPARTMENT_OTHER)
Admission: RE | Admit: 2013-08-27 | Discharge: 2013-08-27 | Disposition: A | Discharge status: Home / Self Care | Payer: Medicare Other | Source: Ambulatory Visit | Attending: Physician Assistant | Admitting: Physician Assistant

## 2013-08-27 ENCOUNTER — Encounter: Payer: Self-pay | Admitting: Physician Assistant

## 2013-08-27 ENCOUNTER — Telehealth: Payer: Self-pay | Admitting: *Deleted

## 2013-08-27 VITALS — BP 124/78 | HR 61 | Temp 98.0°F | Resp 16 | Ht 65.0 in | Wt 174.0 lb

## 2013-08-27 DIAGNOSIS — M545 Low back pain, unspecified: Secondary | ICD-10-CM

## 2013-08-27 DIAGNOSIS — R319 Hematuria, unspecified: Secondary | ICD-10-CM

## 2013-08-27 DIAGNOSIS — N400 Enlarged prostate without lower urinary tract symptoms: Secondary | ICD-10-CM | POA: Diagnosis not present

## 2013-08-27 DIAGNOSIS — R109 Unspecified abdominal pain: Secondary | ICD-10-CM | POA: Diagnosis not present

## 2013-08-27 DIAGNOSIS — N200 Calculus of kidney: Secondary | ICD-10-CM | POA: Diagnosis not present

## 2013-08-27 LAB — CBC
HCT: 40 % (ref 39.0–52.0)
HEMOGLOBIN: 13.9 g/dL (ref 13.0–17.0)
MCH: 31.4 pg (ref 26.0–34.0)
MCHC: 34.8 g/dL (ref 30.0–36.0)
MCV: 90.3 fL (ref 78.0–100.0)
PLATELETS: 264 10*3/uL (ref 150–400)
RBC: 4.43 MIL/uL (ref 4.22–5.81)
RDW: 13.8 % (ref 11.5–15.5)
WBC: 6.8 10*3/uL (ref 4.0–10.5)

## 2013-08-27 LAB — POCT URINALYSIS DIPSTICK
Bilirubin, UA: NEGATIVE
Glucose, UA: NEGATIVE
Ketones, UA: NEGATIVE
NITRITE UA: NEGATIVE
PH UA: 7
Spec Grav, UA: 1.02
UROBILINOGEN UA: 0.2

## 2013-08-27 LAB — BASIC METABOLIC PANEL
BUN: 12 mg/dL (ref 6–23)
CALCIUM: 9.1 mg/dL (ref 8.4–10.5)
CO2: 25 mEq/L (ref 19–32)
Chloride: 104 mEq/L (ref 96–112)
Creat: 0.74 mg/dL (ref 0.50–1.35)
GLUCOSE: 83 mg/dL (ref 70–99)
Potassium: 4 mEq/L (ref 3.5–5.3)
SODIUM: 140 meq/L (ref 135–145)

## 2013-08-27 MED ORDER — HYDROCODONE-ACETAMINOPHEN 5-325 MG PO TABS: 1.0000 | ORAL_TABLET | Freq: Three times a day (TID) | ORAL | Status: DC | PRN

## 2013-08-27 NOTE — Telephone Encounter (Signed)
Provider given instructions.  Referral placed to Urology for assessment and treatment.  Return precautions given.  Will call patient with blood work results once they are in.

## 2013-08-27 NOTE — Progress Notes (Signed)
Pre visit review using our clinic review tool, if applicable. No additional management support is needed unless otherwise documented below in the visit note/SLS  

## 2013-08-27 NOTE — Telephone Encounter (Signed)
STAT Call Report: Provider Informed Study Result    CLINICAL DATA: Left flank pain. Hematuria.  EXAM:  CT ABDOMEN AND PELVIS WITHOUT CONTRAST  TECHNIQUE:  Multidetector CT imaging of the abdomen and pelvis was performed  following the standard protocol without IV contrast.  COMPARISON: 07/20/2012  FINDINGS:  11 x 16 mm calculus again seen within the left renal pelvis. Mild  left pelviectasis is stable without significant caliectasis or  perinephric stranding. No evidence of ureteral calculi or  dilatation. Tiny 2 mm calculus again noted in upper pole of right  kidney. No evidence of right-sided hydronephrosis. No bladder  calculi identified. Mildly enlarged prostate gland appears stable.  Noncontrast images of the liver, gallbladder, pancreas, spleen, and  adrenal glands are unremarkable in appearance. No soft tissue masses  or lymphadenopathy identified. No evidence of inflammatory process  or abnormal fluid collections. No evidence of bowel wall thickening  or dilatation. Incidentally noted are bilateral L5 pars defects  without associated spondylolisthesis.   IMPRESSION:  16 mm calculus persists in the left renal pelvis with stable mild  pelviectasis. No associated caliectasis or other acute findings.  Nonobstructive right nephrolithiasis.  Stable mildly enlarged prostate.  Bilateral L5 pars defects without associated spondylolisthesis.  Electronically Signed  By: Earle Gell M.D.  On: 08/27/2013 10:35

## 2013-08-27 NOTE — Patient Instructions (Signed)
Please go downstairs for your CT scan. I will call you with your results.  Increase your fluid intake.  Your symptoms seem consistent with obstruction from a kidney stone.  The imaging is to verify this and to see if it is small enough for you to pass on your own.  Continue Aleve as needed.  I have given you a prescription for Vicodin to take only if needed for severe pain.

## 2013-08-29 LAB — CULTURE, URINE COMPREHENSIVE
Colony Count: NO GROWTH
ORGANISM ID, BACTERIA: NO GROWTH

## 2013-08-31 DIAGNOSIS — N200 Calculus of kidney: Secondary | ICD-10-CM | POA: Diagnosis not present

## 2013-08-31 DIAGNOSIS — N400 Enlarged prostate without lower urinary tract symptoms: Secondary | ICD-10-CM | POA: Diagnosis not present

## 2013-08-31 DIAGNOSIS — R310 Gross hematuria: Secondary | ICD-10-CM | POA: Diagnosis not present

## 2013-09-01 ENCOUNTER — Other Ambulatory Visit: Payer: Self-pay | Admitting: Urology

## 2013-09-04 DIAGNOSIS — R109 Unspecified abdominal pain: Secondary | ICD-10-CM | POA: Insufficient documentation

## 2013-09-04 NOTE — Progress Notes (Signed)
Patient presents to clinic today c/o intermittent left sided flank pain radiating to lower abdomen x 1 month.  Denies dysuria, urgency frequency or hematuria.  Denies trauma or injury.  Pain not affected by motion. Endorses hx of nephrolithiasis.   Past Medical History  Diagnosis Date  . GERD (gastroesophageal reflux disease)   . Hyperlipidemia   . Hypertension     8-10 years  . Cataract   . CAD (coronary artery disease) March 2011    with a non-ST elevation myocardial infarction. cardiac catherization whic revealed-left main was normal. The LAD had proximal calcification. There was long proximal 30% stenosis. Circumflex inthe AV groove had  proximal  60% stenosis at a mid obtuse marginal. This first large mid obtuse marginal had ostial 70% stenosis. The right coronary artery had a proximal long 50% stenosis.   . Kidney stone   . External hemorrhoids     large external  . Kidney stones 07/20/2012  . Personal history of colonic polyps 04/05/2013    Current Outpatient Prescriptions on File Prior to Visit  Medication Sig Dispense Refill  . amLODipine (NORVASC) 10 MG tablet Take 1 tablet (10 mg total) by mouth daily.  90 tablet  3  . aspirin 81 MG tablet Take 81 mg by mouth daily.       . fluticasone (FLONASE) 50 MCG/ACT nasal spray       . losartan (COZAAR) 100 MG tablet Take 1 tablet (100 mg total) by mouth daily.  90 tablet  3  . metoprolol tartrate (LOPRESSOR) 25 MG tablet 1/2 tablet by mouth twice a day  90 tablet  4  . Multiple Vitamin (MULTIVITAMIN) tablet Take 1 tablet by mouth daily.        . Multiple Vitamins-Minerals (ICAPS PO) Take by mouth daily.      . nitroGLYCERIN (NITROSTAT) 0.4 MG SL tablet Place 1 tablet (0.4 mg total) under the tongue every 5 (five) minutes as needed.  25 tablet  12  . rosuvastatin (CRESTOR) 40 MG tablet Take 1 tablet (40 mg total) by mouth daily.  90 tablet  4   No current facility-administered medications on file prior to visit.    No Known  Allergies  Family History  Problem Relation Age of Onset  . Coronary artery disease Father     in his 32's  . Heart disease Mother     deceased of heart disease  . Heart disease Sister      2  older sisters  . Other Neg Hx     no lung cancer, colon cancer, or prostate  . Colon cancer Neg Hx     History   Social History  . Marital Status: Married    Spouse Name: N/A    Number of Children: N/A  . Years of Education: N/A   Social History Main Topics  . Smoking status: Former Smoker    Quit date: 03/12/2007  . Smokeless tobacco: Never Used     Comment: 40 pack year history  . Alcohol Use: No  . Drug Use: No  . Sexual Activity: None   Other Topics Concern  . None   Social History Narrative   married -61 yrs  Anthony [name])   grew up in [place]   He has one son - two grandaugthers     He is retired.  He had     a 40 pack-year smoking history but has discontinued.     prev occuptation -  [employer]  Alcohol use-no   Smoking Status:  quit   Packs/Day:  0.5   Caffeine use/day:  2 beverages daily   Does Patient Exercise:  yes             Review of Systems - See HPI.  All other ROS are negative.  BP 124/78  Pulse 61  Temp(Src) 98 F (36.7 C) (Oral)  Resp 16  Ht 5\' 5"  (1.651 m)  Wt 174 lb (78.926 kg)  BMI 28.96 kg/m2  SpO2 98%  Physical Exam  Vitals reviewed. Constitutional: He is oriented to person, place, and time and well-developed, well-nourished, and in no distress.  HENT:  Head: Normocephalic and atraumatic.  Eyes: Conjunctivae are normal.  Cardiovascular: Normal rate, regular rhythm, normal heart sounds and intact distal pulses.   Pulmonary/Chest: Effort normal and breath sounds normal. No respiratory distress. He has no wheezes. He has no rales. He exhibits no tenderness.  Negative CVA tenderness  Abdominal: Soft. Bowel sounds are normal. He exhibits no distension and no mass. There is no tenderness. There is no rebound and no  guarding.  Neurological: He is alert and oriented to person, place, and time.  Skin: Skin is warm and dry. No rash noted.  Psychiatric: Affect normal.   Recent Results (from the past 2160 hour(s))  POCT URINALYSIS DIPSTICK     Status: None   Collection Time    08/27/13  9:25 AM      Result Value Ref Range   Color, UA gold     Clarity, UA cloudy     Glucose, UA neg     Bilirubin, UA neg     Ketones, UA neg     Spec Grav, UA 1.020     Blood, UA Large +++     pH, UA 7.0     Protein, UA 100++     Urobilinogen, UA 0.2     Nitrite, UA neg     Leukocytes, UA small (1+)    CBC     Status: None   Collection Time    08/27/13  9:56 AM      Result Value Ref Range   WBC 6.8  4.0 - 10.5 K/uL   RBC 4.43  4.22 - 5.81 MIL/uL   Hemoglobin 13.9  13.0 - 17.0 g/dL   HCT 40.0  39.0 - 52.0 %   MCV 90.3  78.0 - 100.0 fL   MCH 31.4  26.0 - 34.0 pg   MCHC 34.8  30.0 - 36.0 g/dL   RDW 13.8  11.5 - 15.5 %   Platelets 264  150 - 400 K/uL  BASIC METABOLIC PANEL     Status: None   Collection Time    08/27/13  9:56 AM      Result Value Ref Range   Sodium 140  135 - 145 mEq/L   Potassium 4.0  3.5 - 5.3 mEq/L   Chloride 104  96 - 112 mEq/L   CO2 25  19 - 32 mEq/L   Glucose, Bld 83  70 - 99 mg/dL   BUN 12  6 - 23 mg/dL   Creat 0.74  0.50 - 1.35 mg/dL   Calcium 9.1  8.4 - 10.5 mg/dL  CULTURE, URINE COMPREHENSIVE     Status: None   Collection Time    08/27/13 10:06 AM      Result Value Ref Range   Colony Count NO GROWTH     Organism ID, Bacteria NO GROWTH  Assessment/Plan: Left flank pain Urine dip + Large blood. Concern for nephrolithiasis.  Will send urine for culture.  Will obtain CBC and BMP. Will obtain CT abdomen/pelvis to further assess.  Increase fluid intake.  Rest. WIll give Rx for pain medication to use if needed. Return precautions discussed with patient.

## 2013-09-04 NOTE — Assessment & Plan Note (Signed)
Urine dip + Large blood. Concern for nephrolithiasis.  Will send urine for culture.  Will obtain CBC and BMP. Will obtain CT abdomen/pelvis to further assess.  Increase fluid intake.  Rest. WIll give Rx for pain medication to use if needed. Return precautions discussed with patient.

## 2013-09-14 ENCOUNTER — Telehealth: Payer: Self-pay | Admitting: Cardiology

## 2013-09-14 NOTE — Telephone Encounter (Signed)
FORWARD TO DEBRA MATHIS RN 

## 2013-09-14 NOTE — Telephone Encounter (Signed)
Left message for chasity, pt has an appt for clearance wed 09-22-13.

## 2013-09-14 NOTE — Telephone Encounter (Signed)
Please call,regarding his clarence for surgery.

## 2013-09-15 ENCOUNTER — Encounter (HOSPITAL_COMMUNITY): Payer: Self-pay | Admitting: Pharmacy Technician

## 2013-09-22 ENCOUNTER — Ambulatory Visit (INDEPENDENT_AMBULATORY_CARE_PROVIDER_SITE_OTHER): Payer: Medicare Other | Admitting: Cardiology

## 2013-09-22 ENCOUNTER — Encounter (HOSPITAL_COMMUNITY): Payer: Self-pay

## 2013-09-22 ENCOUNTER — Encounter (HOSPITAL_COMMUNITY)
Admission: RE | Admit: 2013-09-22 | Discharge: 2013-09-22 | Disposition: A | Discharge status: Home / Self Care | Payer: Medicare Other | Source: Ambulatory Visit | Attending: Urology | Admitting: Urology

## 2013-09-22 ENCOUNTER — Ambulatory Visit (HOSPITAL_COMMUNITY)
Admission: RE | Admit: 2013-09-22 | Discharge: 2013-09-22 | Disposition: A | Discharge status: Home / Self Care | Payer: Medicare Other | Source: Ambulatory Visit | Attending: Anesthesiology | Admitting: Anesthesiology

## 2013-09-22 ENCOUNTER — Encounter: Payer: Self-pay | Admitting: Cardiology

## 2013-09-22 VITALS — BP 138/80 | HR 68 | Ht 65.0 in | Wt 170.0 lb

## 2013-09-22 DIAGNOSIS — I10 Essential (primary) hypertension: Secondary | ICD-10-CM

## 2013-09-22 DIAGNOSIS — I25119 Atherosclerotic heart disease of native coronary artery with unspecified angina pectoris: Secondary | ICD-10-CM

## 2013-09-22 DIAGNOSIS — I2510 Atherosclerotic heart disease of native coronary artery without angina pectoris: Secondary | ICD-10-CM

## 2013-09-22 DIAGNOSIS — Z01818 Encounter for other preprocedural examination: Secondary | ICD-10-CM | POA: Diagnosis not present

## 2013-09-22 DIAGNOSIS — Z01812 Encounter for preprocedural laboratory examination: Secondary | ICD-10-CM | POA: Insufficient documentation

## 2013-09-22 DIAGNOSIS — E785 Hyperlipidemia, unspecified: Secondary | ICD-10-CM

## 2013-09-22 DIAGNOSIS — N200 Calculus of kidney: Secondary | ICD-10-CM

## 2013-09-22 DIAGNOSIS — I209 Angina pectoris, unspecified: Secondary | ICD-10-CM | POA: Diagnosis not present

## 2013-09-22 DIAGNOSIS — Z87891 Personal history of nicotine dependence: Secondary | ICD-10-CM | POA: Insufficient documentation

## 2013-09-22 DIAGNOSIS — R918 Other nonspecific abnormal finding of lung field: Secondary | ICD-10-CM | POA: Diagnosis not present

## 2013-09-22 DIAGNOSIS — I679 Cerebrovascular disease, unspecified: Secondary | ICD-10-CM

## 2013-09-22 LAB — BASIC METABOLIC PANEL
ANION GAP: 11 (ref 5–15)
BUN: 14 mg/dL (ref 6–23)
CALCIUM: 9.6 mg/dL (ref 8.4–10.5)
CO2: 25 mEq/L (ref 19–32)
Chloride: 101 mEq/L (ref 96–112)
Creatinine, Ser: 0.81 mg/dL (ref 0.50–1.35)
GFR calc Af Amer: >90 mL/min (ref >90)
GFR calc non Af Amer: 89 mL/min — ABNORMAL LOW (ref >90)
GLUCOSE: 153 mg/dL — AB (ref 70–99)
POTASSIUM: 3.8 meq/L (ref 3.7–5.3)
SODIUM: 137 meq/L (ref 137–147)

## 2013-09-22 LAB — CBC
HCT: 41.5 % (ref 39.0–52.0)
HEMOGLOBIN: 14.4 g/dL (ref 13.0–17.0)
MCH: 31.3 pg (ref 26.0–34.0)
MCHC: 34.7 g/dL (ref 30.0–36.0)
MCV: 90.2 fL (ref 78.0–100.0)
PLATELETS: 244 10*3/uL (ref 150–400)
RBC: 4.6 MIL/uL (ref 4.22–5.81)
RDW: 12.4 % (ref 11.5–15.5)
WBC: 7.6 10*3/uL (ref 4.0–10.5)

## 2013-09-22 NOTE — Assessment & Plan Note (Signed)
Blood pressure controlled. Continue present medications. 

## 2013-09-22 NOTE — Progress Notes (Signed)
HPI: FU CAD; admitted in March 2011 with a non-ST elevation myocardial infarction. He underwent cardiac catheterization which revealed - Left main was normal. The LAD had proximal calcification. There was long proximal 30% stenosis. Circumflex in the AV groove had proximal 60% stenosis at a mid obtuse marginal. This first large mid obtuse marginal had ostial 70% stenosis. The right coronary artery had a proximal long 50% stenosis. There was mid subtotal stenosis. The EF was 50% with inferior hypokinesis. Patient had a drug-eluting stent to the right coronary artery at that time. Abdominal ultrasound in July of 2011 showed no aneurysm. Nuclear study in December of 2013 showed an ejection fraction of 71% and normal perfusion. Carotid Dopplers in December of 2014 showed a 40-59% left stenosis and normal right. Followup recommended in one year. I last saw him in Dec of 2014. Since then the patient denies any dyspnea on exertion, orthopnea, PND, pedal edema, palpitations, syncope or chest pain. Patient has a left kidney stone which will require surgery and we were asked to evaluate preoperatively.   Current Outpatient Prescriptions  Medication Sig Dispense Refill  . amLODipine (NORVASC) 10 MG tablet Take 10 mg by mouth every morning.      Marland Kitchen aspirin 81 MG tablet Take 81 mg by mouth daily.       Marland Kitchen losartan (COZAAR) 100 MG tablet Take 100 mg by mouth every morning.       . metoprolol tartrate (LOPRESSOR) 25 MG tablet Take 12.5 mg by mouth 2 (two) times daily.      . Multiple Vitamin (MULTIVITAMIN) tablet Take 1 tablet by mouth daily.       . Multiple Vitamins-Minerals (ICAPS PO) Take by mouth daily.      . nitroGLYCERIN (NITROSTAT) 0.4 MG SL tablet Place 1 tablet (0.4 mg total) under the tongue every 5 (five) minutes as needed.  25 tablet  12  . rosuvastatin (CRESTOR) 40 MG tablet Take 40 mg by mouth daily.       No current facility-administered medications for this visit.     Past Medical  History  Diagnosis Date  . GERD (gastroesophageal reflux disease)   . Hyperlipidemia   . Hypertension     8-10 years  . Cataract   . CAD (coronary artery disease) March 2011    with a non-ST elevation myocardial infarction. cardiac catherization whic revealed-left main was normal. The LAD had proximal calcification. There was long proximal 30% stenosis. Circumflex inthe AV groove had  proximal  60% stenosis at a mid obtuse marginal. This first large mid obtuse marginal had ostial 70% stenosis. The right coronary artery had a proximal long 50% stenosis.   . Kidney stone   . External hemorrhoids     large external  . Kidney stones 07/20/2012  . Personal history of colonic polyps 04/05/2013    Past Surgical History  Procedure Laterality Date  . Cataract extraction Bilateral 2013  . Coronary angioplasty with stent placement  2011  . Colonoscopy  2009    [facility]    History   Social History  . Marital Status: Married    Spouse Name: N/A    Number of Children: N/A  . Years of Education: N/A   Occupational History  . Not on file.   Social History Main Topics  . Smoking status: Former Smoker    Quit date: 03/12/2007  . Smokeless tobacco: Never Used     Comment: 40 pack year history  . Alcohol Use:  No  . Drug Use: No  . Sexual Activity: Not on file   Other Topics Concern  . Not on file   Social History Narrative   married -23 yrs  [name])   grew up in [place]   He has one son - two grandaugthers     He is retired.  He had     a 40 pack-year smoking history but has discontinued.     prev occuptation -  [employer]   Alcohol use-no   Smoking Status:  quit   Packs/Day:  0.5   Caffeine use/day:  2 beverages daily   Does Patient Exercise:  yes             ROS: Pain in left flank from kidney stone but no fevers or chills, productive cough, hemoptysis, dysphasia, odynophagia, melena, hematochezia, dysuria, hematuria, rash, seizure activity,  orthopnea, PND, pedal edema, claudication. Remaining systems are negative.  Physical Exam: Well-developed well-nourished in no acute distress.  Skin is warm and dry.  HEENT is normal.  Neck is supple.  Chest is clear to auscultation with normal expansion.  Cardiovascular exam is regular rate and rhythm.  Abdominal exam nontender or distended. No masses palpated. Extremities show no edema. neuro grossly intact  ECG Sinus rhythm at a rate of 68. First degree AV block. No ST changes.

## 2013-09-22 NOTE — Assessment & Plan Note (Signed)
Patient has been diagnosed with a left kidney stone which will require surgical intervention. Recent nuclear study showed no ischemia. No symptoms of chest pain or dyspnea. He may proceed without further cardiac evaluation. I would prefer that aspirin be continued if possible. However if necessary it can be discontinued 5 days prior to the procedure and resumed afterwards.

## 2013-09-22 NOTE — Patient Instructions (Addendum)
[place]  09/22/2013   Your procedure is scheduled on: 7-22   -2015  Enter through [facility] Entrance and follow signs to [facility]. Arrive at     1100   AM .  Call this number if you have problems the morning of surgery: [phone]  Or Presurgical Testing [phone](Evolet Salminen) For Living Will and/or Health Care Power Attorney Forms: please provide copy for your medical record,may bring AM of surgery(Forms should be already notarized -we do not provide this service).(09-22-13 Yes/ Copy provided  Today and placed with chart.).  Remember: Follow any bowel prep instructions per MD office.(follow MD office instructions)   Do not eat food:After Midnight.    .  Take these medicines the morning of surgery with A SIP OF WATER: Amlodipine. Metoprolol. Hydrocodone(if needed). Use Aspirin as per MD instructions).   Do not wear jewelry, make-up or nail polish.  Do not wear lotions, powders, or perfumes. You may wear deodorant.  Do not shave 48 hours(2 days) prior to first CHG shower(legs and under arms).(Shaving face and neck okay.)  Do not bring valuables to the hospital.(Hospital is not responsible for lost valuables).  Contacts, dentures or removable bridgework, body piercing, hair pins may not be worn into surgery.  Leave suitcase in the car. After surgery it may be brought to your room.  For patients admitted to the hospital, checkout time is 11:00 AM the day of discharge.(Restricted visitors-Any Persons displaying flu-like symptoms or illness).    Patients discharged the day of surgery will not be allowed to drive home. Must have responsible person with you x 24 hours once discharged.  Name and phone number of your driver: Kem Boroughs [phone] cell  Special Instructions: CHG(Chlorhedine 4%-"Hibiclens","Betasept","Aplicare") Shower Use Special Wash: see special instructions.(avoid face and genitals)    Remember : Type/Screen "Blue armbands" - may not be  removed once applied(would result in being retested AM of surgery, if removed   ______________________    [facility] - Preparing for Surgery Before surgery, you can play an important role.  Because skin is not sterile, your skin needs to be as free of germs as possible.  You can reduce the number of germs on your skin by washing with CHG (chlorahexidine gluconate) soap before surgery.  CHG is an antiseptic cleaner which kills germs and bonds with the skin to continue killing germs even after washing. Please DO NOT use if you have an allergy to CHG or antibacterial soaps.  If your skin becomes reddened/irritated stop using the CHG and inform your nurse when you arrive at Short Stay. Do not shave (including legs and underarms) for at least 48 hours prior to the first CHG shower.  You may shave your face/neck. Please follow these instructions carefully:  1.  Shower with CHG Soap the night before surgery and the  morning of Surgery.  2.  If you choose to wash your hair, wash your hair first as usual with your  normal  shampoo.  3.  After you shampoo, rinse your hair and body thoroughly to remove the  shampoo.                           4.  Use CHG as you would any other liquid soap.  You can apply chg directly  to the skin and wash  Gently with a scrungie or clean washcloth.  5.  Apply the CHG Soap to your body ONLY FROM THE NECK DOWN.   Do not use on face/ open                           Wound or open sores. Avoid contact with eyes, ears mouth and genitals (private parts).                       Wash face,  Genitals (private parts) with your normal soap.             6.  Wash thoroughly, paying special attention to the area where your surgery  will be performed.  7.  Thoroughly rinse your body with warm water from the neck down.  8.  DO NOT shower/wash with your normal soap after using and rinsing off  the CHG Soap.                9.  Pat yourself dry with a clean towel.             10.  Wear clean pajamas.            11.  Place clean sheets on your bed the night of your first shower and do not  sleep with pets. Day of Surgery : Do not apply any lotions/deodorants the morning of surgery.  Please wear clean clothes to the hospital/surgery center.  FAILURE TO FOLLOW THESE INSTRUCTIONS MAY RESULT IN THE CANCELLATION OF YOUR SURGERY PATIENT SIGNATURE_________________________________  NURSE SIGNATURE__________________________________  ________________________________________________________________________

## 2013-09-22 NOTE — Assessment & Plan Note (Signed)
Continue aspirin and statin. 

## 2013-09-22 NOTE — Patient Instructions (Signed)
Your physician wants you to follow-up in: [name] will receive a reminder letter in the mail two months in advance. If you don't receive a letter, please call our office to schedule the follow-up appointment.

## 2013-09-22 NOTE — Assessment & Plan Note (Signed)
Continue statin. 

## 2013-09-22 NOTE — Pre-Procedure Instructions (Signed)
09-22-13 EKG done today -Dr. Debbra Riding office, note in [place]. CXR done [place].

## 2013-09-22 NOTE — Assessment & Plan Note (Signed)
Continue aspirin and statin. Followup carotid Dopplers December 2015.

## 2013-09-28 MED ORDER — GENTAMICIN SULFATE 40 MG/ML IJ SOLN
360.0000 mg | INTRAVENOUS | Status: AC
  Administered 2013-09-29: 360 mg via INTRAVENOUS
  Filled 2013-09-28: qty 9

## 2013-09-29 ENCOUNTER — Ambulatory Visit (HOSPITAL_COMMUNITY): Payer: Medicare Other

## 2013-09-29 ENCOUNTER — Ambulatory Visit (HOSPITAL_COMMUNITY): Payer: Medicare Other | Admitting: Certified Registered Nurse Anesthetist

## 2013-09-29 ENCOUNTER — Encounter (HOSPITAL_COMMUNITY): Payer: Medicare Other | Admitting: Certified Registered Nurse Anesthetist

## 2013-09-29 ENCOUNTER — Encounter (HOSPITAL_COMMUNITY)
Admission: RE | Disposition: A | Discharge status: Home / Self Care | Payer: Self-pay | Source: Ambulatory Visit | Attending: Urology

## 2013-09-29 ENCOUNTER — Observation Stay (HOSPITAL_COMMUNITY)
Admission: RE | Admit: 2013-09-29 | Discharge: 2013-09-30 | Disposition: A | Discharge status: Home / Self Care | Payer: Medicare Other | Source: Ambulatory Visit | Attending: Urology | Admitting: Urology

## 2013-09-29 ENCOUNTER — Encounter (HOSPITAL_COMMUNITY): Payer: Self-pay | Admitting: *Deleted

## 2013-09-29 DIAGNOSIS — R310 Gross hematuria: Secondary | ICD-10-CM | POA: Insufficient documentation

## 2013-09-29 DIAGNOSIS — Z87891 Personal history of nicotine dependence: Secondary | ICD-10-CM | POA: Insufficient documentation

## 2013-09-29 DIAGNOSIS — Z79899 Other long term (current) drug therapy: Secondary | ICD-10-CM | POA: Diagnosis not present

## 2013-09-29 DIAGNOSIS — R319 Hematuria, unspecified: Secondary | ICD-10-CM | POA: Diagnosis not present

## 2013-09-29 DIAGNOSIS — N200 Calculus of kidney: Secondary | ICD-10-CM | POA: Diagnosis not present

## 2013-09-29 DIAGNOSIS — I10 Essential (primary) hypertension: Secondary | ICD-10-CM | POA: Diagnosis not present

## 2013-09-29 DIAGNOSIS — I252 Old myocardial infarction: Secondary | ICD-10-CM | POA: Insufficient documentation

## 2013-09-29 DIAGNOSIS — I2510 Atherosclerotic heart disease of native coronary artery without angina pectoris: Secondary | ICD-10-CM | POA: Diagnosis not present

## 2013-09-29 DIAGNOSIS — E785 Hyperlipidemia, unspecified: Secondary | ICD-10-CM | POA: Insufficient documentation

## 2013-09-29 DIAGNOSIS — K219 Gastro-esophageal reflux disease without esophagitis: Secondary | ICD-10-CM | POA: Diagnosis not present

## 2013-09-29 DIAGNOSIS — Z7982 Long term (current) use of aspirin: Secondary | ICD-10-CM | POA: Insufficient documentation

## 2013-09-29 HISTORY — PX: NEPHROLITHOTOMY: SHX5134

## 2013-09-29 HISTORY — PX: URETEROSCOPY: SHX842

## 2013-09-29 HISTORY — PX: CYSTOSCOPY W/ RETROGRADES: SHX1426

## 2013-09-29 LAB — BASIC METABOLIC PANEL
Anion gap: 13 (ref 5–15)
BUN: 12 mg/dL (ref 6–23)
CHLORIDE: 102 meq/L (ref 96–112)
CO2: 23 mEq/L (ref 19–32)
Calcium: 8.6 mg/dL (ref 8.4–10.5)
Creatinine, Ser: 1.12 mg/dL (ref 0.50–1.35)
GFR calc Af Amer: 76 mL/min — ABNORMAL LOW (ref >90)
GFR calc non Af Amer: 66 mL/min — ABNORMAL LOW (ref >90)
GLUCOSE: 151 mg/dL — AB (ref 70–99)
POTASSIUM: 3.8 meq/L (ref 3.7–5.3)
Sodium: 138 mEq/L (ref 137–147)

## 2013-09-29 LAB — HEMOGLOBIN AND HEMATOCRIT, BLOOD
HCT: 37.2 % — ABNORMAL LOW (ref 39.0–52.0)
Hemoglobin: 13 g/dL (ref 13.0–17.0)

## 2013-09-29 SURGERY — NEPHROLITHOTOMY PERCUTANEOUS
Anesthesia: General | Site: Ureter

## 2013-09-29 MED ORDER — EPHEDRINE SULFATE 50 MG/ML IJ SOLN
INTRAMUSCULAR | Status: DC | PRN
  Administered 2013-09-29: 10 mg via INTRAVENOUS

## 2013-09-29 MED ORDER — KCL IN DEXTROSE-NACL 20-5-0.45 MEQ/L-%-% IV SOLN
INTRAVENOUS | Status: DC
  Administered 2013-09-29 – 2013-09-30 (×2): via INTRAVENOUS
  Filled 2013-09-29 (×2): qty 1000

## 2013-09-29 MED ORDER — ONDANSETRON HCL 4 MG/2ML IJ SOLN
INTRAMUSCULAR | Status: AC
  Filled 2013-09-29: qty 2

## 2013-09-29 MED ORDER — HYDROMORPHONE HCL PF 1 MG/ML IJ SOLN
INTRAMUSCULAR | Status: AC
  Filled 2013-09-29: qty 1

## 2013-09-29 MED ORDER — PROPOFOL 10 MG/ML IV BOLUS
INTRAVENOUS | Status: AC
  Filled 2013-09-29: qty 20

## 2013-09-29 MED ORDER — OXYCODONE HCL 5 MG PO TABS
5.0000 mg | ORAL_TABLET | ORAL | Status: DC | PRN
Start: 2013-09-29 — End: 2013-09-30
  Administered 2013-09-29 – 2013-09-30 (×3): 5 mg via ORAL
  Filled 2013-09-29 (×3): qty 1

## 2013-09-29 MED ORDER — HYDROMORPHONE HCL PF 1 MG/ML IJ SOLN
0.2500 mg | INTRAMUSCULAR | Status: DC | PRN
  Administered 2013-09-29: 0.5 mg via INTRAVENOUS

## 2013-09-29 MED ORDER — LOSARTAN POTASSIUM 50 MG PO TABS
50.0000 mg | ORAL_TABLET | Freq: Every morning | ORAL | Status: DC
  Filled 2013-09-29: qty 1

## 2013-09-29 MED ORDER — GLYCOPYRROLATE 0.2 MG/ML IJ SOLN
INTRAMUSCULAR | Status: AC
  Filled 2013-09-29: qty 3

## 2013-09-29 MED ORDER — SUCCINYLCHOLINE CHLORIDE 20 MG/ML IJ SOLN
INTRAMUSCULAR | Status: DC | PRN
  Administered 2013-09-29: 100 mg via INTRAVENOUS

## 2013-09-29 MED ORDER — DEXAMETHASONE SODIUM PHOSPHATE 10 MG/ML IJ SOLN
INTRAMUSCULAR | Status: AC
  Filled 2013-09-29: qty 1

## 2013-09-29 MED ORDER — OXYCODONE HCL 5 MG/5ML PO SOLN: 5.0000 mg | Freq: Once | ORAL | Status: DC | PRN

## 2013-09-29 MED ORDER — HYDROMORPHONE HCL PF 1 MG/ML IJ SOLN
0.5000 mg | INTRAMUSCULAR | Status: DC | PRN
  Administered 2013-09-29: 1 mg via INTRAVENOUS
  Filled 2013-09-29: qty 1

## 2013-09-29 MED ORDER — NEOSTIGMINE METHYLSULFATE 10 MG/10ML IV SOLN
INTRAVENOUS | Status: DC | PRN
  Administered 2013-09-29: 5 mg via INTRAVENOUS

## 2013-09-29 MED ORDER — SODIUM CHLORIDE 0.9 % IR SOLN
Status: DC | PRN
  Administered 2013-09-29: 12000 mL

## 2013-09-29 MED ORDER — FENTANYL CITRATE 0.05 MG/ML IJ SOLN
INTRAMUSCULAR | Status: AC
  Filled 2013-09-29: qty 5

## 2013-09-29 MED ORDER — PROMETHAZINE HCL 25 MG/ML IJ SOLN: 6.2500 mg | INTRAMUSCULAR | Status: DC | PRN

## 2013-09-29 MED ORDER — BELLADONNA ALKALOIDS-OPIUM 16.2-60 MG RE SUPP
1.0000 | Freq: Three times a day (TID) | RECTAL | Status: DC | PRN
  Administered 2013-09-29: 1 via RECTAL
  Filled 2013-09-29: qty 1

## 2013-09-29 MED ORDER — IOHEXOL 300 MG/ML  SOLN
INTRAMUSCULAR | Status: DC | PRN
  Administered 2013-09-29: 25 mL

## 2013-09-29 MED ORDER — ONDANSETRON HCL 4 MG/2ML IJ SOLN
INTRAMUSCULAR | Status: DC | PRN
  Administered 2013-09-29: 4 mg via INTRAVENOUS

## 2013-09-29 MED ORDER — MIDAZOLAM HCL 5 MG/5ML IJ SOLN
INTRAMUSCULAR | Status: DC | PRN
  Administered 2013-09-29: 2 mg via INTRAVENOUS

## 2013-09-29 MED ORDER — METOPROLOL TARTRATE 12.5 MG HALF TABLET
12.5000 mg | ORAL_TABLET | Freq: Two times a day (BID) | ORAL | Status: DC
  Administered 2013-09-29: 12.5 mg via ORAL
  Filled 2013-09-29 (×3): qty 1

## 2013-09-29 MED ORDER — ATORVASTATIN CALCIUM 80 MG PO TABS
80.0000 mg | ORAL_TABLET | Freq: Every day | ORAL | Status: DC
Start: 2013-09-29 — End: 2013-09-30
  Administered 2013-09-29: 80 mg via ORAL
  Filled 2013-09-29 (×2): qty 1

## 2013-09-29 MED ORDER — OXYCODONE HCL 5 MG PO TABS: 5.0000 mg | ORAL_TABLET | Freq: Once | ORAL | Status: DC | PRN

## 2013-09-29 MED ORDER — LIDOCAINE HCL (CARDIAC) 20 MG/ML IV SOLN
INTRAVENOUS | Status: DC | PRN
  Administered 2013-09-29: 100 mg via INTRAVENOUS

## 2013-09-29 MED ORDER — PHENYLEPHRINE HCL 10 MG/ML IJ SOLN
INTRAMUSCULAR | Status: DC | PRN
  Administered 2013-09-29 (×3): 80 ug via INTRAVENOUS

## 2013-09-29 MED ORDER — STERILE WATER FOR IRRIGATION IR SOLN
Status: DC | PRN
  Administered 2013-09-29: 1000 mL

## 2013-09-29 MED ORDER — AMLODIPINE BESYLATE 10 MG PO TABS
10.0000 mg | ORAL_TABLET | Freq: Every morning | ORAL | Status: DC
  Filled 2013-09-29: qty 1

## 2013-09-29 MED ORDER — LIDOCAINE HCL (CARDIAC) 20 MG/ML IV SOLN
INTRAVENOUS | Status: AC
  Filled 2013-09-29: qty 5

## 2013-09-29 MED ORDER — SENNA 8.6 MG PO TABS
1.0000 | ORAL_TABLET | Freq: Two times a day (BID) | ORAL | Status: DC
  Filled 2013-09-29: qty 1

## 2013-09-29 MED ORDER — PROPOFOL 10 MG/ML IV BOLUS
INTRAVENOUS | Status: DC | PRN
  Administered 2013-09-29: 150 mg via INTRAVENOUS

## 2013-09-29 MED ORDER — LACTATED RINGERS IV SOLN
INTRAVENOUS | Status: DC
  Administered 2013-09-29: 1000 mL via INTRAVENOUS
  Administered 2013-09-29: 14:00:00 via INTRAVENOUS

## 2013-09-29 MED ORDER — DEXAMETHASONE SODIUM PHOSPHATE 10 MG/ML IJ SOLN
INTRAMUSCULAR | Status: DC | PRN
  Administered 2013-09-29: 10 mg via INTRAVENOUS

## 2013-09-29 MED ORDER — ROCURONIUM BROMIDE 100 MG/10ML IV SOLN
INTRAVENOUS | Status: AC
  Filled 2013-09-29: qty 1

## 2013-09-29 MED ORDER — ACETAMINOPHEN 500 MG PO TABS
1000.0000 mg | ORAL_TABLET | Freq: Three times a day (TID) | ORAL | Status: DC
  Administered 2013-09-29 – 2013-09-30 (×2): 1000 mg via ORAL
  Filled 2013-09-29 (×3): qty 2

## 2013-09-29 MED ORDER — HYDROMORPHONE HCL PF 1 MG/ML IJ SOLN
0.2500 mg | INTRAMUSCULAR | Status: DC | PRN
  Administered 2013-09-29 (×5): 0.5 mg via INTRAVENOUS

## 2013-09-29 MED ORDER — ONDANSETRON HCL 4 MG/2ML IJ SOLN: 4.0000 mg | INTRAMUSCULAR | Status: DC | PRN

## 2013-09-29 MED ORDER — GLYCOPYRROLATE 0.2 MG/ML IJ SOLN
INTRAMUSCULAR | Status: DC | PRN
  Administered 2013-09-29: 0.6 mg via INTRAVENOUS

## 2013-09-29 MED ORDER — NEOSTIGMINE METHYLSULFATE 10 MG/10ML IV SOLN
INTRAVENOUS | Status: AC
  Filled 2013-09-29: qty 1

## 2013-09-29 MED ORDER — FENTANYL CITRATE 0.05 MG/ML IJ SOLN
INTRAMUSCULAR | Status: DC | PRN
  Administered 2013-09-29 (×3): 50 ug via INTRAVENOUS
  Administered 2013-09-29: 100 ug via INTRAVENOUS

## 2013-09-29 MED ORDER — ROCURONIUM BROMIDE 100 MG/10ML IV SOLN
INTRAVENOUS | Status: DC | PRN
  Administered 2013-09-29: 20 mg via INTRAVENOUS
  Administered 2013-09-29: 40 mg via INTRAVENOUS

## 2013-09-29 MED ORDER — DOCUSATE SODIUM 100 MG PO CAPS
100.0000 mg | ORAL_CAPSULE | Freq: Two times a day (BID) | ORAL | Status: DC
  Filled 2013-09-29 (×3): qty 1

## 2013-09-29 MED ORDER — MIDAZOLAM HCL 2 MG/2ML IJ SOLN
INTRAMUSCULAR | Status: AC
  Filled 2013-09-29: qty 2

## 2013-09-29 MED ORDER — MEPERIDINE HCL 50 MG/ML IJ SOLN: 6.2500 mg | INTRAMUSCULAR | Status: DC | PRN

## 2013-09-29 SURGICAL SUPPLY — 61 items
APPLICATOR SURGIFLO ENDO (HEMOSTASIS) ×6 IMPLANT
BAG URINE DRAINAGE (UROLOGICAL SUPPLIES) ×6 IMPLANT
BASKET STONE NITINOL 3FRX115MB (UROLOGICAL SUPPLIES) ×6 IMPLANT
BASKET ZERO TIP NITINOL 2.4FR (BASKET) IMPLANT
BENZOIN TINCTURE PRP APPL 2/3 (GAUZE/BANDAGES/DRESSINGS) ×6 IMPLANT
BLADE SURG 15 STRL LF DISP TIS (BLADE) ×4 IMPLANT
BLADE SURG 15 STRL SS (BLADE) ×2
CATH BEACON 5.038 65CM KMP-01 (CATHETERS) ×6 IMPLANT
CATH FOLEY 2W COUNCIL 20FR 5CC (CATHETERS) IMPLANT
CATH FOLEY 2WAY SLVR  5CC 16FR (CATHETERS) ×2
CATH FOLEY 2WAY SLVR 5CC 16FR (CATHETERS) ×4 IMPLANT
CATH INTERMIT  6FR 70CM (CATHETERS) ×6 IMPLANT
CATH ROBINSON RED A/P 20FR (CATHETERS) IMPLANT
CATH X-FORCE N30 NEPHROSTOMY (TUBING) IMPLANT
CHLORAPREP W/TINT 26ML (MISCELLANEOUS) ×6 IMPLANT
COVER SURGICAL LIGHT HANDLE (MISCELLANEOUS) ×6 IMPLANT
DRAPE C-ARM 42X120 X-RAY (DRAPES) ×6 IMPLANT
DRAPE CAMERA CLOSED 9X96 (DRAPES) ×12 IMPLANT
DRAPE LG THREE QUARTER DISP (DRAPES) ×12 IMPLANT
DRAPE LINGEMAN PERC (DRAPES) ×6 IMPLANT
DRAPE SURG IRRIG POUCH 19X23 (DRAPES) ×6 IMPLANT
DRSG PAD ABDOMINAL 8X10 ST (GAUZE/BANDAGES/DRESSINGS) IMPLANT
DRSG TEGADERM 4X4.75 (GAUZE/BANDAGES/DRESSINGS) ×6 IMPLANT
DRSG TEGADERM 8X12 (GAUZE/BANDAGES/DRESSINGS) IMPLANT
FIBER LASER FLEXIVA 1000 (UROLOGICAL SUPPLIES) IMPLANT
FIBER LASER FLEXIVA 550 (UROLOGICAL SUPPLIES) IMPLANT
GAUZE SPONGE 4X4 12PLY STRL (GAUZE/BANDAGES/DRESSINGS) ×6 IMPLANT
GLOVE BIOGEL M STRL SZ7.5 (GLOVE) ×12 IMPLANT
GOWN STRL REUS W/TWL LRG LVL3 (GOWN DISPOSABLE) ×6 IMPLANT
GOWN STRL REUS W/TWL XL LVL3 (GOWN DISPOSABLE) ×12 IMPLANT
GUIDEWIRE AMPLAZ .035X145 (WIRE) ×12 IMPLANT
GUIDEWIRE ANG ZIPWIRE 038X150 (WIRE) ×12 IMPLANT
GUIDEWIRE STR DUAL SENSOR (WIRE) IMPLANT
KIT BASIN OR (CUSTOM PROCEDURE TRAY) ×6 IMPLANT
MANIFOLD NEPTUNE II (INSTRUMENTS) ×6 IMPLANT
NEEDLE TROCAR 18X15 ECHO (NEEDLE) ×6 IMPLANT
NEEDLE TROCAR 18X20 (NEEDLE) IMPLANT
NS IRRIG 1000ML POUR BTL (IV SOLUTION) IMPLANT
PACK BASIC VI WITH GOWN DISP (CUSTOM PROCEDURE TRAY) ×6 IMPLANT
PACK CYSTO (CUSTOM PROCEDURE TRAY) ×6 IMPLANT
PROBE LITHOCLAST ULTRA 3.8X403 (UROLOGICAL SUPPLIES) ×6 IMPLANT
PROBE PNEUMATIC 1.0MMX570MM (UROLOGICAL SUPPLIES) ×6 IMPLANT
SET IRRIG Y TYPE TUR BLADDER L (SET/KITS/TRAYS/PACK) ×6 IMPLANT
SET WARMING FLUID IRRIGATION (MISCELLANEOUS) IMPLANT
SHEATH PEELAWAY SET 9 (SHEATH) ×6 IMPLANT
SPOGE SURGIFLO 8M (HEMOSTASIS) ×2
SPONGE GAUZE 4X4 12PLY (GAUZE/BANDAGES/DRESSINGS) ×5 IMPLANT
SPONGE LAP 4X18 X RAY DECT (DISPOSABLE) ×6 IMPLANT
SPONGE SURGIFLO 8M (HEMOSTASIS) ×4 IMPLANT
STENT CONTOUR 6FRX26X.038 (STENTS) ×6 IMPLANT
STONE CATCHER W/TUBE ADAPTER (UROLOGICAL SUPPLIES) ×6 IMPLANT
SUT SILK 2 0 30  PSL (SUTURE) ×2
SUT SILK 2 0 30 PSL (SUTURE) ×4 IMPLANT
SUT VIC AB 3-0 PS2 18 (SUTURE) ×2
SUT VIC AB 3-0 PS2 18XBRD (SUTURE) ×4 IMPLANT
SYRINGE 12CC LL (MISCELLANEOUS) ×6 IMPLANT
SYRINGE 20CC LL (MISCELLANEOUS) ×12 IMPLANT
TOWEL OR 17X26 10 PK STRL BLUE (TOWEL DISPOSABLE) ×6 IMPLANT
TUBING CONNECTING 10 (TUBING) ×5 IMPLANT
TUBING CONNECTING 10' (TUBING) ×1
WATER STERILE IRR 1500ML POUR (IV SOLUTION) IMPLANT

## 2013-09-29 NOTE — Anesthesia Postprocedure Evaluation (Signed)
Anesthesia Post Note  Patient: Anthony [name]  Procedure(s) Performed: Procedure(s) (LRB): NEPHROLITHOTOMY PERCUTANEOUS WITH ACCESS (Left) CYSTOSCOPY WITH RETROGRADE PYELOGRAM (Bilateral) HOLMIUM LASER APPLICATION (Left) URETEROSCOPY (N/A)  Anesthesia type: General  Patient location: PACU  Post pain: Pain level controlled  Post assessment: Post-op Vital signs reviewed  Last Vitals: BP 155/74  Pulse 86  Temp(Src) 36.1 C (Oral)  Resp 11  Ht 5\' 5"  (1.651 m)  Wt 171 lb (77.565 kg)  BMI 28.46 kg/m2  SpO2 100%  Post vital signs: Reviewed  Level of consciousness: sedated  Complications: No apparent anesthesia complications

## 2013-09-29 NOTE — Anesthesia Preprocedure Evaluation (Addendum)
Anesthesia Evaluation  Patient identified by MRN, date of birth, ID band Patient awake    Reviewed: Allergy & Precautions, H&P , NPO status , Patient's Chart, lab work & pertinent test results, reviewed documented beta blocker date and time   Airway Mallampati: II TM Distance: >3 FB Neck ROM: Full    Dental no notable dental hx.    Pulmonary former smoker,  breath sounds clear to auscultation  Pulmonary exam normal       Cardiovascular hypertension, Pt. on medications and Pt. on home beta blockers + CAD Rhythm:Regular Rate:Normal     Neuro/Psych negative neurological ROS  negative psych ROS   GI/Hepatic Neg liver ROS, GERD-  ,  Endo/Other  negative endocrine ROS  Renal/GU Renal disease     Musculoskeletal negative musculoskeletal ROS (+)   Abdominal   Peds  Hematology negative hematology ROS (+)   Anesthesia Other Findings   Reproductive/Obstetrics negative OB ROS                          Anesthesia Physical Anesthesia Plan  ASA: III  Anesthesia Plan: General   Post-op Pain Management:    Induction: Intravenous  Airway Management Planned: Oral ETT  Additional Equipment:   Intra-op Plan:   Post-operative Plan: Extubation in OR  Informed Consent: I have reviewed the patients History and Physical, chart, labs and discussed the procedure including the risks, benefits and alternatives for the proposed anesthesia with the patient or authorized representative who has indicated his/her understanding and acceptance.   Dental advisory given  Plan Discussed with: CRNA  Anesthesia Plan Comments: (CAD with up to 70% stenosis on cath 05/2009, cards eval no active ischemia by recent stress test. )        Anesthesia Quick Evaluation

## 2013-09-29 NOTE — H&P (Signed)
Anthony [name] is an 68 y.o. male.     Chief Complaint: Pre-OP Cysto, Bilateral Retrogrades and Left Percutaneous Nephrostolithotomy  HPI:     1 - Gross Hematuria - New gross hematuria at time of flank pain 2015. Remote 40-PY smoker.Non-contrast CT 08/2013 with large left renal stone, no masses.   2 - Nephrolithiasis -  08/2013 - Left 19mm renal pelvis stone with very mild calectasis (1100HU, SSD 11 cm) and small rt 76mm non-obstructing renal stone by CT.  Pelvis stone with interval enlargement since 2013.  PMH sig for CAD/MI/Stent (now ASA only and no deficts), HTN, HLD. No prior chest or abdominal sugery.   Today "Anthony [name]" is seen to proceed with surgery to address his enlarging left renal stone and complete hematuria evaluaiton.  His PCP is Gwyneth Revels MD. His cardiologist is Dr. Stanford Breed  Past Medical History  Diagnosis Date  . GERD (gastroesophageal reflux disease)   . Hyperlipidemia   . Hypertension     8-10 years  . Cataract   . CAD (coronary artery disease) March 2011    with a non-ST elevation myocardial infarction. cardiac catherization whic revealed-left main was normal. The LAD had proximal calcification. There was long proximal 30% stenosis. Circumflex inthe AV groove had  proximal  60% stenosis at a mid obtuse marginal. This first large mid obtuse marginal had ostial 70% stenosis. The right coronary artery had a proximal long 50% stenosis.   . Kidney stone   . External hemorrhoids     large external  . Kidney stones 07/20/2012  . Personal history of colonic polyps 04/05/2013    Past Surgical History  Procedure Laterality Date  . Cataract extraction Bilateral 2013  . Coronary angioplasty with stent placement  2011  . Colonoscopy  2009    polyp-[facility], 2015- x4 polyps removed    Family History  Problem Relation Age of Onset  . Coronary artery disease Father     in his 81's  . Heart disease Mother     deceased of heart disease  . Heart disease Sister      2   older sisters  . Other Neg Hx     no lung cancer, colon cancer, or prostate  . Colon cancer Neg Hx    Social History:  reports that he quit smoking about 6 years ago. He has never used smokeless tobacco. He reports that he does not drink alcohol or use illicit drugs.  Allergies: No Known Allergies  No prescriptions prior to admission    No results found for this or any previous visit (from the past 48 hour(s)). No results found.  Review of Systems  Constitutional: Negative.  Negative for fever and chills.  Eyes: Negative.   Respiratory: Negative.   Cardiovascular: Negative.   Gastrointestinal: Negative.  Negative for nausea and vomiting.  Genitourinary: Positive for flank pain.  Musculoskeletal: Negative.   Skin: Negative.   Neurological: Negative.   Endo/Heme/Allergies: Negative.   Psychiatric/Behavioral: Negative.     There were no vitals taken for this visit. Physical Exam  Constitutional: He is oriented to person, place, and time. He appears well-developed and well-nourished.  HENT:  Head: Normocephalic and atraumatic.  Eyes: EOM are normal. Pupils are equal, round, and reactive to light.  Neck: Normal range of motion. Neck supple.  Cardiovascular: Normal rate and regular rhythm.   Respiratory: Effort normal.  GI: Soft. Bowel sounds are normal.  Genitourinary: Penis normal.  Musculoskeletal: Normal range of motion.  Neurological: He is  alert and oriented to person, place, and time.  Skin: Skin is warm and dry.  Psychiatric: He has a normal mood and affect. His behavior is normal. Judgment and thought content normal.     Assessment/Plan   1 - Gross Hematuria - likely from stone, but still needs cysto / retrogrades to complete eval. Would be most efficient to perform at time of stone surgery.  2 - Nephrolithiasis - left renal stone appears intermitantly obstructing (ball-valve effect) and with progression. Favor percutaneous treatment to prevent to prevent future  renal compromise and allow symptom improvement.   We rediscussed percutaneous nephrostolithotomy (PCNL) in detail including need for percutaneous access which is sometimes gained by the surgeon, and other times by radiology or through existing nephrostomy tubes if present. We specifically rediscussed that often times tubes remain in after surgery until we are confident all stone has been treated. We rementioned that staged surgery is needed in over 50% of cases of very large or complex stone. We then rediscussed general risks including bleeding, infection, damage to kidney / ureter / bladder, loss of kidney, as well as anesthetic risks and rare but serious surgical complications including DVT, PE, MI, and mortality.   Anthony [name] 09/29/2013, 6:50 AM

## 2013-09-29 NOTE — Transfer of Care (Signed)
Immediate Anesthesia Transfer of Care Note  Patient: Anthony [name]  Procedure(s) Performed: Procedure(s): NEPHROLITHOTOMY PERCUTANEOUS WITH ACCESS (Left) CYSTOSCOPY WITH RETROGRADE PYELOGRAM (Bilateral) HOLMIUM LASER APPLICATION (Left) URETEROSCOPY (N/A)  Patient Location: PACU  Anesthesia Type:General  Level of Consciousness: awake and alert   Airway & Oxygen Therapy: Patient Spontanous Breathing and Patient connected to face mask oxygen  Post-op Assessment: Report given to PACU RN and Post -op Vital signs reviewed and stable  Post vital signs: Reviewed and stable  Complications: No apparent anesthesia complications

## 2013-09-29 NOTE — Brief Op Note (Signed)
09/29/2013  3:16 PM  PATIENT:  Bishop Limbo  68 y.o. male  PRE-OPERATIVE DIAGNOSIS:  LEFT RENAL STONE, HEMATURIA  POST-OPERATIVE DIAGNOSIS:  LEFT RENAL STONE, HEMATURIA  PROCEDURE:  Cysto with left retrograde and ureteral stent, left access to kidney and dilation of tract, left percutaneous nephrostolithotomy stone >2cm, left diagnostic ureteroscopy, left antegrade nephrostogram  SURGEON:  Surgeon(s) and Role:    * Alexis Frock, MD - Primary  PHYSICIAN ASSISTANT:   ASSISTANTS: none   ANESTHESIA:   general  EBL:  Total I/O In: 1000 [I.V.:1000] Out: -   BLOOD ADMINISTERED:none  DRAINS: foley to straight drain   LOCAL MEDICATIONS USED:  NONE  SPECIMEN:  Source of Specimen:  Left renal stone fragments  DISPOSITION OF SPECIMEN:  [facility] for compositional analysis  COUNTS:  YES  TOURNIQUET:  * No tourniquets in log *  DICTATION: .Other Dictation: Dictation Number system logged out and did not report number after dictation  PLAN OF CARE: Admit for overnight observation  PATIENT DISPOSITION:  PACU - hemodynamically stable.   Delay start of Pharmacological VTE agent (>24hrs) due to surgical blood loss or risk of bleeding: yes

## 2013-09-30 ENCOUNTER — Encounter (HOSPITAL_COMMUNITY): Payer: Self-pay | Admitting: Urology

## 2013-09-30 DIAGNOSIS — N200 Calculus of kidney: Secondary | ICD-10-CM | POA: Diagnosis not present

## 2013-09-30 LAB — BASIC METABOLIC PANEL
Anion gap: 12 (ref 5–15)
BUN: 14 mg/dL (ref 6–23)
CALCIUM: 9.1 mg/dL (ref 8.4–10.5)
CO2: 24 mEq/L (ref 19–32)
CREATININE: 0.92 mg/dL (ref 0.50–1.35)
Chloride: 100 mEq/L (ref 96–112)
GFR calc Af Amer: >90 mL/min (ref >90)
GFR, EST NON AFRICAN AMERICAN: 85 mL/min — AB (ref >90)
GLUCOSE: 192 mg/dL — AB (ref 70–99)
Potassium: 4.1 mEq/L (ref 3.7–5.3)
Sodium: 136 mEq/L — ABNORMAL LOW (ref 137–147)

## 2013-09-30 LAB — HEMOGLOBIN AND HEMATOCRIT, BLOOD
HCT: 36.4 % — ABNORMAL LOW (ref 39.0–52.0)
Hemoglobin: 12.6 g/dL — ABNORMAL LOW (ref 13.0–17.0)

## 2013-09-30 MED ORDER — ASPIRIN EC 81 MG PO TBEC
81.0000 mg | DELAYED_RELEASE_TABLET | Freq: Every day | ORAL | Status: DC
  Filled 2013-09-30: qty 1

## 2013-09-30 MED ORDER — SENNOSIDES-DOCUSATE SODIUM 8.6-50 MG PO TABS: 1.0000 | ORAL_TABLET | Freq: Two times a day (BID) | ORAL | Status: DC

## 2013-09-30 MED ORDER — CEPHALEXIN 500 MG PO CAPS: 500.0000 mg | ORAL_CAPSULE | Freq: Two times a day (BID) | ORAL | Status: DC

## 2013-09-30 MED ORDER — OXYCODONE-ACETAMINOPHEN 5-325 MG PO TABS: 1.0000 | ORAL_TABLET | Freq: Four times a day (QID) | ORAL | Status: DC | PRN

## 2013-09-30 NOTE — Care Management Note (Signed)
    Page 1 of 1   09/30/2013     1:31:56 PM CARE MANAGEMENT NOTE 09/30/2013  Patient:  Anthony [name], Anthony [name]   Account Number:  [id number]  Date Initiated:  09/30/2013  Documentation initiated by:  Dessa Phi  Subjective/Objective Assessment:   68 Y/O M ADMITTED W/STAGHORN CALCULUS.     Action/Plan:   FROM HOME.   Anticipated DC Date:  09/30/2013   Anticipated DC Plan:  [place]  CM consult      Choice offered to / List presented to:             Status of service:  Completed, signed off Medicare Important Message given?   (If response is "NO", the following Medicare IM given date fields will be blank) Date Medicare IM given:   Medicare IM given by:   Date Additional Medicare IM given:   Additional Medicare IM given by:    Discharge Disposition:  HOME/SELF CARE  Per UR Regulation:  Reviewed for med. necessity/level of care/duration of stay  If discussed at [place] of Stay Meetings, dates discussed:    Comments:  09/30/13 Azad Calame RN,BSN NCM [phone] D/C HOME NO [place].

## 2013-09-30 NOTE — Op Note (Signed)
NAMEYOSHIHARU, Anthony [name] NO.:  [id number]  MEDICAL RECORD NO.:  [id number]  LOCATION:  1245                         FACILITY:  [facility]  PHYSICIAN:  Alexis Frock, MD     DATE OF BIRTH:  [birth year]  DATE OF PROCEDURE: DATE OF DISCHARGE:                              OPERATIVE REPORT   DIAGNOSIS:  Large left renal stone, approximately 3 cm.  PROCEDURES: 1. Cystoscopy with left retrograde pyelogram and interpretation. 2. Insertion of left ureteral stent, 6 x 26, no tether. 3. Left antegrade nephrostogram with interpretation. 4. Left percutaneous access to the kidney with dilation of tract. 5. Left percutaneous nephrostolithotomy greater than 2 cm.  ESTIMATED BLOOD LOSS:  100 mL.  COMPLICATIONS:  None.  SPECIMEN:  Left renal stone fragments for compositional analysis.  FINDINGS: 1. Unremarkable urinary bladder. 2. Left retrograde pyelogram with large filling defect and renal     pelvis consistent with known stone. 3. Percutaneous access in a lower mid lateral calyx. 4. Complete resolution of stone fragments including renal and ureteral     stone fragments. 5. Final antegrade nephrostogram without medial contrast     extravasation.  INDICATIONS:  Anthony [name] is a pleasant 68 year old gentleman with history of hematuria and left flank pain.  He was found on workup of this to have a large left intrarenal stone with size progression compared to prior studies.  Options were discussed including shockwave lithotripsy versus stage ureteroscopy versus percutaneous nephrostolithotomy, and he wished to proceed with the latter.  Informed consent was obtained and placed in the medical record.  PROCEDURE IN DETAIL:  The patient being, Anthony [name], was verified. Procedure being left percutaneous nephrostolithotomy with access was confirmed.  Procedure was carried out.  Time-out was performed. Intravenous antibiotics were administered.  General endotracheal anesthesia  was introduced.  The patient was initially placed into a low lithotomy position, and sterile field was created by prepping and draping the patient's penis, perineum, and proximal thighs using iodine x3.  Next, cystourethroscopy was performed using a 22-French rigid cystoscope with 12-degree offset lens.  Inspection of the anterior and posterior urethra were unremarkable.  The left ureteral orifice was cannulated with a 6-French end-hole catheter and left retrograde pyelogram was obtained.  Left retrograde pyelogram demonstrated a single left ureter with single- system left kidney.  There was some J-hooking of the ureterovesical junction.  There was large filling defect in the renal pelvis consistent with known stone without significant hydroureteronephrosis; however, orientation is consistent with likely ball valving.  The 6-French end- hole catheter was then advanced at the level of renal pelvis over a glide wire.  A Foley catheter was placed per urethra to straight drain and this externalized nephroureteral stent was fashioned to the catheter and connected to contrast primed IV extension tubing.  The patient was then completely repositioned into prone on the separate OR table employing sequential compression devices, prone view, chest rolls, axillary rolls, padding of the knees and ankles, and placed in the 15 degrees stable flexion.  New sterile field was created by prepping and draping the patient's entire left flank including the previously externalized nephroureteral stent into the single operative field. Attention was directed to  the percutaneous access to the kidney using simultaneous fluoroscopy and retrograde pyelography, a suitable calyx in a lower midpole, which appeared to be a wide caliber and offering minimal angulation to the stone was identified for percutaneous access engaging bull's-eye technique at 15 degrees of center and an 18-gauge Chiba needle.  This calyx was  percutaneously accessed as verified by copious efflux of yellow urine.  An angle tipped zip wire was then coiled at the level of the renal pelvis and then navigated down the left ureter via a KMP catheter and then exchanged to a superstiff wire via the KMP catheter.  Next, incision was made at this point to the skin approximately 2 cm and the dual-lumen coaxial dilator was advanced to the level of proximal ureter, over which, the zip wire was advanced and exchanged for a superstiff wire via the KMP catheter to the level of the bladder.  Having two superstiff wires in place, the percutaneous drape was then applied once superstiff wire was set aside for safety wire. Next, a 30-French NephroMax balloon dilation apparatus was carefully navigated across this calyx, inflated to pressure of 16 atmospheres, held for 90 seconds and the sheath was carefully advanced under continuous fluoroscopic guidance.  The balloon was let down and rigid nephroscopy was performed.  This revealed excellent sheath placement and direct apposition to large stone.  The stone appeared to be much too large for simple basketing.  As such, lithoclast dual ultrasound pneumatic energy was applied to the stone for approximately 90 minutes, which completely ablated the vast majority of the stone.  Next, flexible nephroscopy was performed using a 16-French flexible cystoscope and several additional small calcifications were found and removed using an NCircle basket.  Following these maneuvers, all stones larger than 1/3 mm had been removed from the kidney.  There were some question of acutely angled calices at the percutaneous tract, which were not visualized; however, it was felt that there was low likelihood of stone in these locations given the solitary renal pelvis stone preoperatively. Next, the 6-French non-digital ureteroscope was carefully navigated over one of the superstiff wire at the level of the urinary bladder.   After removal of the previous nephroureteral stent and the entire left ureter was visualized, no mucosal abnormalities or residual stone fragments were seen.  Final antegrade nephrostogram revealed excellent patency of contrast from the kidney to the level of the urinary bladder without extravasation.  Finally, a new zip wire was advanced in an antegrade fashion at the level of the urinary bladder, through which, a new 6 x 26, Contour stent was placed.  Good distal curl in the urinary bladder and proximal curl in the renal pelvis were visualized.  The sheath was removed, and the tract sealed with 10 mL of FloSeal followed by closure of the skin using interrupted Vicryl and dry dressing.  Procedure was then terminated.  The patient tolerated the procedure well.  There were no immediate periprocedural complications.  The patient was taken to the postanesthesia care unit in stable condition.          ______________________________ Alexis Frock, MD     TM/MEDQ  D:  09/29/2013  T:  09/30/2013  Job:  [id number]

## 2013-09-30 NOTE — Discharge Summary (Signed)
Physician Discharge Summary  Patient ID: Anthony [name] MRN: [id number] DOB/AGE: 1946-02-20 68 y.o.  Admit date: 09/29/2013 Discharge date: 09/30/2013  Admission Diagnoses: Left Staghorn Kidney Stone  Discharge Diagnoses:  Active Problems:   Staghorn calculus   Discharged Condition: good  Hospital Course:   1 - Nephrolithiasis -  S/p cysto, left retrograde, left percutaneous nephrostolithtomy with access and ureteral stent placement on 09/29/13, the day of admission, without acute complicaiotns. He was admitted for observation overnight. By POD1, the day of discharge, Hgb stable >12, Cr stable <1.5, pt voiding, tollerating regular diet, afebrile, pain controlled, and felt to be adequate for discharge. He had ASA help peri-op to begin again at discharge given hgb stable.   Consults: None  Significant Diagnostic Studies: labs: Hgb stable >12, Cr stable <1.5,  Treatments: surgery: cysto, left retrograde, left percutaneous nephrostolithtomy with access and ureteral stent placement on 09/29/13  Discharge Exam: Blood pressure 157/61, pulse 101, temperature 97.6 F (36.4 C), temperature source Oral, resp. rate 20, height 5\' 5"  (1.651 m), weight 77.565 kg (171 lb), SpO2 99.00%. General appearance: alert, cooperative, appears stated age and wife at bedside Head: Normocephalic, without obvious abnormality, atraumatic Nose: Nares normal. Septum midline. Mucosa normal. No drainage or sinus tenderness. Throat: lips, mucosa, and tongue normal; teeth and gums normal Neck: supple, symmetrical, trachea midline Back: symmetric, no curvature. ROM normal. No CVA tenderness. Resp: non-labored on room air Chest wall: no tenderness Cardio: Nl rate (80s on exam) GI: soft, non-tender; bowel sounds normal; no masses,  no organomegaly Male genitalia: normal Extremities: extremities normal, atraumatic, no cyanosis or edema Pulses: 2+ and symmetric Skin: Skin color, texture, turgor normal. No rashes or  lesions Lymph nodes: Cervical, supraclavicular, and axillary nodes normal. Neurologic: Grossly normal Incision/Wound: Left flank dressing c/d/i. No large echhymoses or hematoma.  Disposition: 01-Home or Self Care     Medication List         amLODipine 10 MG tablet  Commonly known as:  NORVASC  Take 10 mg by mouth every morning.     aspirin 81 MG tablet  Take 81 mg by mouth daily.     cephALEXin 500 MG capsule  Commonly known as:  KEFLEX  Take 1 capsule (500 mg total) by mouth 2 (two) times daily. X 3 days. Start day before next urology appointment.     ICAPS PO  Take by mouth daily.     losartan 100 MG tablet  Commonly known as:  COZAAR  Take 100 mg by mouth every morning.     metoprolol tartrate 25 MG tablet  Commonly known as:  LOPRESSOR  Take 12.5 mg by mouth 2 (two) times daily.     multivitamin tablet  Take 1 tablet by mouth daily.     nitroGLYCERIN 0.4 MG SL tablet  Commonly known as:  NITROSTAT  Place 1 tablet (0.4 mg total) under the tongue every 5 (five) minutes as needed.     oxyCODONE-acetaminophen 5-325 MG per tablet  Commonly known as:  ROXICET  Take 1-2 tablets by mouth every 6 (six) hours as needed for moderate pain or severe pain. Post-operatively     rosuvastatin 40 MG tablet  Commonly known as:  CRESTOR  Take 40 mg by mouth daily.     senna-docusate 8.6-50 MG per tablet  Commonly known as:  Senokot-S  Take 1 tablet by mouth 2 (two) times daily. While taking pain meds to prevent constipation           Follow-up  Information   Follow up with Alexis Frock, MD. Call on . (We will call to arrange f/u visit in about 2-3 weeks for MD visit and office stent removal)    Specialty:  Urology   Contact information:   [address] [place] [id number] [phone]       Signed: Alexis Frock 09/30/2013, 7:49 AM

## 2013-09-30 NOTE — Discharge Instructions (Signed)
1 - You may have urinary urgency (bladder spasms) and bloody urine on / off with stent in place. This is normal. ° °2 - Call MD or go to ER for fever >102, severe pain / nausea / vomiting not relieved by medications, or acute change in medical status ° °

## 2013-10-11 DIAGNOSIS — N200 Calculus of kidney: Secondary | ICD-10-CM | POA: Diagnosis not present

## 2013-10-11 DIAGNOSIS — R310 Gross hematuria: Secondary | ICD-10-CM | POA: Diagnosis not present

## 2013-10-19 DIAGNOSIS — N200 Calculus of kidney: Secondary | ICD-10-CM | POA: Diagnosis not present

## 2013-10-26 DIAGNOSIS — R300 Dysuria: Secondary | ICD-10-CM | POA: Diagnosis not present

## 2013-10-26 DIAGNOSIS — R310 Gross hematuria: Secondary | ICD-10-CM | POA: Diagnosis not present

## 2013-10-26 DIAGNOSIS — N200 Calculus of kidney: Secondary | ICD-10-CM | POA: Diagnosis not present

## 2013-11-05 DIAGNOSIS — N390 Urinary tract infection, site not specified: Secondary | ICD-10-CM | POA: Diagnosis not present

## 2013-11-05 DIAGNOSIS — R300 Dysuria: Secondary | ICD-10-CM | POA: Diagnosis not present

## 2013-11-09 DIAGNOSIS — N200 Calculus of kidney: Secondary | ICD-10-CM | POA: Diagnosis not present

## 2013-11-10 DIAGNOSIS — N200 Calculus of kidney: Secondary | ICD-10-CM | POA: Diagnosis not present

## 2013-11-30 DIAGNOSIS — N390 Urinary tract infection, site not specified: Secondary | ICD-10-CM | POA: Diagnosis not present

## 2013-11-30 DIAGNOSIS — R82998 Other abnormal findings in urine: Secondary | ICD-10-CM | POA: Diagnosis not present

## 2013-11-30 DIAGNOSIS — R34 Anuria and oliguria: Secondary | ICD-10-CM | POA: Diagnosis not present

## 2013-11-30 DIAGNOSIS — N200 Calculus of kidney: Secondary | ICD-10-CM | POA: Diagnosis not present

## 2013-12-15 ENCOUNTER — Encounter: Payer: Self-pay | Admitting: Family

## 2013-12-15 ENCOUNTER — Ambulatory Visit (INDEPENDENT_AMBULATORY_CARE_PROVIDER_SITE_OTHER): Payer: Medicare Other | Admitting: Family

## 2013-12-15 VITALS — BP 130/64 | HR 71 | Temp 98.1°F | Resp 16 | Ht 65.25 in | Wt 167.6 lb

## 2013-12-15 DIAGNOSIS — J309 Allergic rhinitis, unspecified: Secondary | ICD-10-CM | POA: Diagnosis not present

## 2013-12-15 DIAGNOSIS — Z23 Encounter for immunization: Secondary | ICD-10-CM | POA: Diagnosis not present

## 2013-12-15 DIAGNOSIS — I25119 Atherosclerotic heart disease of native coronary artery with unspecified angina pectoris: Secondary | ICD-10-CM

## 2013-12-15 NOTE — Patient Instructions (Signed)
Start flonase and zyrtec. Call if symptoms worsen or if not improved in 1 week.   Allergic Rhinitis Allergic rhinitis is when the mucous membranes in the nose respond to allergens. Allergens are particles in the air that cause your body to have an allergic reaction. This causes you to release allergic antibodies. Through a chain of events, these eventually cause you to release histamine into the blood stream. Although meant to protect the body, it is this release of histamine that causes your discomfort, such as frequent sneezing, congestion, and an itchy, runny nose.  CAUSES  Seasonal allergic rhinitis (hay fever) is caused by pollen allergens that may come from grasses, trees, and weeds. Year-round allergic rhinitis (perennial allergic rhinitis) is caused by allergens such as house dust mites, pet dander, and mold spores.  SYMPTOMS   Nasal stuffiness (congestion).  Itchy, runny nose with sneezing and tearing of the eyes. DIAGNOSIS  Your health care provider can help you determine the allergen or allergens that trigger your symptoms. If you and your health care provider are unable to determine the allergen, skin or blood testing may be used. TREATMENT  Allergic rhinitis does not have a cure, but it can be controlled by:  Medicines and allergy shots (immunotherapy).  Avoiding the allergen. Hay fever may often be treated with antihistamines in pill or nasal spray forms. Antihistamines block the effects of histamine. There are over-the-counter medicines that may help with nasal congestion and swelling around the eyes. Check with your health care provider before taking or giving this medicine.  If avoiding the allergen or the medicine prescribed do not work, there are many new medicines your health care provider can prescribe. Stronger medicine may be used if initial measures are ineffective. Desensitizing injections can be used if medicine and avoidance does not work. Desensitization is when a  patient is given ongoing shots until the body becomes less sensitive to the allergen. Make sure you follow up with your health care provider if problems continue. HOME CARE INSTRUCTIONS It is not possible to completely avoid allergens, but you can reduce your symptoms by taking steps to limit your exposure to them. It helps to know exactly what you are allergic to so that you can avoid your specific triggers. SEEK MEDICAL CARE IF:   You have a fever.  You develop a cough that does not stop easily (persistent).  You have shortness of breath.  You start wheezing.  Symptoms interfere with normal daily activities. Document Released: 11/20/2000 Document Revised: 03/02/2013 Document Reviewed: 11/02/2012 [facility] Patient Information 2015 [place], [place]. This information is not intended to replace advice given to you by your health care provider. Make sure you discuss any questions you have with your health care provider.

## 2013-12-15 NOTE — Progress Notes (Signed)
Pre visit review using our clinic review tool, if applicable. No additional management support is needed unless otherwise documented below in the visit note. 

## 2013-12-15 NOTE — Progress Notes (Signed)
Subjective:    Patient ID: Anthony [name], male    DOB: 1945/12/08, 68 y.o.   MRN: [id number]  HPI  Anthony [name] is a 68 year old male who presents today with chief complaint of cough. He reports that the cough started one month ago and has sustained every day for one month.  It is a dry hacking cough, feels like a tickle.  The cough occurs mostly when he is sitting up but not at night.  He reports that it always occurs this time of year.  He has not taken anything for the cough.  The cough is worse when it is cold, it does not get worse when he eats.  Reports + associated nasal congestion/drainage   Review of Systems  Constitutional: Negative for fever and fatigue.  HENT: Negative for congestion, postnasal drip, rhinorrhea, sinus pressure and sore throat.   Eyes: Negative.   Respiratory: Positive for cough. Negative for chest tightness and shortness of breath.   Cardiovascular: Negative for chest pain, palpitations and leg swelling.  Skin: Negative.  Negative for rash.       Past Medical History  Diagnosis Date  . GERD (gastroesophageal reflux disease)   . Hyperlipidemia   . Hypertension     8-10 years  . Cataract   . CAD (coronary artery disease) March 2011    with a non-ST elevation myocardial infarction. cardiac catherization whic revealed-left main was normal. The LAD had proximal calcification. There was long proximal 30% stenosis. Circumflex inthe AV groove had  proximal  60% stenosis at a mid obtuse marginal. This first large mid obtuse marginal had ostial 70% stenosis. The right coronary artery had a proximal long 50% stenosis.   . Kidney stone   . External hemorrhoids     large external  . Kidney stones 07/20/2012  . Personal history of colonic polyps 04/05/2013    History   Social History  . Marital Status: Married    Spouse Name: N/A    Number of Children: N/A  . Years of Education: N/A   Occupational History  . Not on file.   Social History Main Topics  .  Smoking status: Former Smoker    Quit date: 03/12/2007  . Smokeless tobacco: Never Used     Comment: 40 pack year history  . Alcohol Use: No  . Drug Use: No  . Sexual Activity: Not on file   Other Topics Concern  . Not on file   Social History Narrative   married -37 yrs  [name] Dortches)   grew up in [place]   He has one son - two grandaugthers     He is retired.  He had     a 40 pack-year smoking history but has discontinued.     prev occuptation -  [employer]   Alcohol use-no   Smoking Status:  quit   Packs/Day:  0.5   Caffeine use/day:  2 beverages daily   Does Patient Exercise:  yes             Past Surgical History  Procedure Laterality Date  . Cataract extraction Bilateral 2013  . Coronary angioplasty with stent placement  2011  . Colonoscopy  2009    polyp-[facility], 2015- x4 polyps removed  . Nephrolithotomy Left 09/29/2013    Procedure: NEPHROLITHOTOMY PERCUTANEOUS WITH ACCESS;  Surgeon: Alexis Frock, MD;  Location: [facility];  Service: Urology;  Laterality: Left;  . Cystoscopy w/ retrogrades Bilateral 09/29/2013  Procedure: CYSTOSCOPY WITH RETROGRADE PYELOGRAM;  Surgeon: Alexis Frock, MD;  Location: [facility];  Service: Urology;  Laterality: Bilateral;  . Ureteroscopy N/A 09/29/2013    Procedure: URETEROSCOPY;  Surgeon: Alexis Frock, MD;  Location: [facility];  Service: Urology;  Laterality: N/A;    Family History  Problem Relation Age of Onset  . Coronary artery disease Father     in his 76's  . Heart disease Mother     deceased of heart disease  . Heart disease Sister      2  older sisters  . Other Neg Hx     no lung cancer, colon cancer, or prostate  . Colon cancer Neg Hx     No Known Allergies  Current Outpatient Prescriptions on File Prior to Visit  Medication Sig Dispense Refill  . amLODipine (NORVASC) 10 MG tablet Take 10 mg by mouth every morning.      Marland Kitchen aspirin 81 MG tablet Take 81 mg by mouth daily.       Marland Kitchen losartan (COZAAR) 100  MG tablet Take 100 mg by mouth every morning.       . metoprolol tartrate (LOPRESSOR) 25 MG tablet Take 12.5 mg by mouth 2 (two) times daily.      . Multiple Vitamin (MULTIVITAMIN) tablet Take 1 tablet by mouth daily.       . Multiple Vitamins-Minerals (ICAPS PO) Take by mouth daily.      . rosuvastatin (CRESTOR) 40 MG tablet Take 40 mg by mouth daily.       No current facility-administered medications on file prior to visit.    BP 130/64  Pulse 71  Temp(Src) 98.1 F (36.7 C) (Oral)  Resp 16  Ht 5' 5.25" (1.657 m)  Wt 167 lb 9.6 oz (76.023 kg)  BMI 27.69 kg/m2  SpO2 100%    Objective:   Physical Exam  Constitutional: He is oriented to person, place, and time. He appears well-developed and well-nourished.  HENT:  Head: Normocephalic and atraumatic.  Right Ear: External ear normal.  Left Ear: External ear normal.  Nose: Nose normal.  Mouth/Throat: Oropharynx is clear and moist. No oropharyngeal exudate.  Eyes: Pupils are equal, round, and reactive to light.  Neck: Normal range of motion. Neck supple.  Cardiovascular: Normal rate, regular rhythm and normal heart sounds.   No murmur heard. Pulmonary/Chest: Effort normal and breath sounds normal. He has no wheezes.  Neurological: He is alert and oriented to person, place, and time.  Skin: Skin is warm and dry.          Assessment & Plan:  Zyrtec and flonase for allergy symptoms.  Will need to follow up if fever develops.

## 2013-12-15 NOTE — Assessment & Plan Note (Signed)
Trial of zyrtec and flonase, follow up if symptoms worsen or if symptoms do not improve.

## 2013-12-17 ENCOUNTER — Ambulatory Visit: Payer: Medicare Other

## 2014-01-20 DIAGNOSIS — N200 Calculus of kidney: Secondary | ICD-10-CM | POA: Diagnosis not present

## 2014-01-20 DIAGNOSIS — R8299 Other abnormal findings in urine: Secondary | ICD-10-CM | POA: Diagnosis not present

## 2014-01-20 DIAGNOSIS — N390 Urinary tract infection, site not specified: Secondary | ICD-10-CM | POA: Diagnosis not present

## 2014-01-20 DIAGNOSIS — R34 Anuria and oliguria: Secondary | ICD-10-CM | POA: Diagnosis not present

## 2014-02-25 ENCOUNTER — Other Ambulatory Visit (HOSPITAL_COMMUNITY): Payer: Self-pay | Admitting: *Deleted

## 2014-02-25 DIAGNOSIS — I6523 Occlusion and stenosis of bilateral carotid arteries: Secondary | ICD-10-CM

## 2014-03-14 ENCOUNTER — Ambulatory Visit (HOSPITAL_COMMUNITY): Payer: Medicare Other | Attending: Family Medicine | Admitting: Cardiology

## 2014-03-14 DIAGNOSIS — I6523 Occlusion and stenosis of bilateral carotid arteries: Secondary | ICD-10-CM

## 2014-03-14 DIAGNOSIS — Z87891 Personal history of nicotine dependence: Secondary | ICD-10-CM | POA: Insufficient documentation

## 2014-03-14 DIAGNOSIS — R0989 Other specified symptoms and signs involving the circulatory and respiratory systems: Secondary | ICD-10-CM | POA: Diagnosis not present

## 2014-03-14 DIAGNOSIS — I10 Essential (primary) hypertension: Secondary | ICD-10-CM | POA: Diagnosis not present

## 2014-03-14 DIAGNOSIS — E785 Hyperlipidemia, unspecified: Secondary | ICD-10-CM | POA: Diagnosis not present

## 2014-03-14 DIAGNOSIS — I2510 Atherosclerotic heart disease of native coronary artery without angina pectoris: Secondary | ICD-10-CM | POA: Diagnosis not present

## 2014-03-14 NOTE — Progress Notes (Signed)
Carotid duplex performed 

## 2014-03-17 ENCOUNTER — Encounter: Payer: Self-pay | Admitting: Family

## 2014-03-17 DIAGNOSIS — I6529 Occlusion and stenosis of unspecified carotid artery: Secondary | ICD-10-CM | POA: Insufficient documentation

## 2014-04-05 ENCOUNTER — Encounter: Payer: Self-pay | Admitting: Family Medicine

## 2014-04-05 ENCOUNTER — Ambulatory Visit (INDEPENDENT_AMBULATORY_CARE_PROVIDER_SITE_OTHER): Payer: Medicare Other | Admitting: Family Medicine

## 2014-04-05 VITALS — BP 144/73 | HR 67 | Temp 97.7°F | Ht 64.0 in | Wt 175.4 lb

## 2014-04-05 DIAGNOSIS — N200 Calculus of kidney: Secondary | ICD-10-CM

## 2014-04-05 DIAGNOSIS — E782 Mixed hyperlipidemia: Secondary | ICD-10-CM

## 2014-04-05 DIAGNOSIS — R739 Hyperglycemia, unspecified: Secondary | ICD-10-CM | POA: Diagnosis not present

## 2014-04-05 DIAGNOSIS — I10 Essential (primary) hypertension: Secondary | ICD-10-CM

## 2014-04-05 DIAGNOSIS — Z0000 Encounter for general adult medical examination without abnormal findings: Secondary | ICD-10-CM

## 2014-04-05 DIAGNOSIS — E785 Hyperlipidemia, unspecified: Secondary | ICD-10-CM

## 2014-04-05 DIAGNOSIS — K219 Gastro-esophageal reflux disease without esophagitis: Secondary | ICD-10-CM

## 2014-04-05 LAB — COMPREHENSIVE METABOLIC PANEL
ALT: 16 U/L (ref 0–53)
AST: 16 U/L (ref 0–37)
Albumin: 4.2 g/dL (ref 3.5–5.2)
Alkaline Phosphatase: 73 U/L (ref 39–117)
BILIRUBIN TOTAL: 0.8 mg/dL (ref 0.2–1.2)
BUN: 11 mg/dL (ref 6–23)
CALCIUM: 9 mg/dL (ref 8.4–10.5)
CHLORIDE: 103 meq/L (ref 96–112)
CO2: 28 meq/L (ref 19–32)
Creatinine, Ser: 0.76 mg/dL (ref 0.40–1.50)
GFR: 108.15 mL/min (ref >60.00)
Glucose, Bld: 94 mg/dL (ref 70–99)
Potassium: 3.8 mEq/L (ref 3.5–5.1)
SODIUM: 138 meq/L (ref 135–145)
Total Protein: 7.4 g/dL (ref 6.0–8.3)

## 2014-04-05 LAB — CBC
HCT: 41.3 % (ref 39.0–52.0)
HEMOGLOBIN: 14.1 g/dL (ref 13.0–17.0)
MCHC: 34.2 g/dL (ref 30.0–36.0)
MCV: 90.8 fl (ref 78.0–100.0)
Platelets: 239 10*3/uL (ref 150.0–400.0)
RBC: 4.55 Mil/uL (ref 4.22–5.81)
RDW: 13.4 % (ref 11.5–15.5)
WBC: 8.7 10*3/uL (ref 4.0–10.5)

## 2014-04-05 LAB — URINALYSIS
Bilirubin Urine: NEGATIVE
Ketones, ur: NEGATIVE
LEUKOCYTES UA: NEGATIVE
NITRITE: NEGATIVE
Specific Gravity, Urine: 1.015 (ref 1.000–1.030)
TOTAL PROTEIN, URINE-UPE24: NEGATIVE
Urine Glucose: NEGATIVE
Urobilinogen, UA: 0.2 (ref 0.0–1.0)
pH: 8 (ref 5.0–8.0)

## 2014-04-05 LAB — LIPID PANEL
Cholesterol: 103 mg/dL (ref 0–200)
HDL: 38.2 mg/dL — ABNORMAL LOW (ref >39.00)
LDL Cholesterol: 31 mg/dL (ref 0–99)
NonHDL: 64.8
Total CHOL/HDL Ratio: 3
Triglycerides: 171 mg/dL — ABNORMAL HIGH (ref 0.0–149.0)
VLDL: 34.2 mg/dL (ref 0.0–40.0)

## 2014-04-05 LAB — TSH: TSH: 2.2 u[IU]/mL (ref 0.35–4.50)

## 2014-04-05 LAB — HEMOGLOBIN A1C: HEMOGLOBIN A1C: 6.3 % (ref 4.6–6.5)

## 2014-04-05 MED ORDER — AMLODIPINE BESYLATE 10 MG PO TABS: 10.0000 mg | ORAL_TABLET | Freq: Every morning | ORAL | Status: DC

## 2014-04-05 MED ORDER — LOSARTAN POTASSIUM 100 MG PO TABS: 100.0000 mg | ORAL_TABLET | Freq: Every morning | ORAL | Status: DC

## 2014-04-05 NOTE — Patient Instructions (Signed)
Preventive Care for Adults A healthy lifestyle and preventive care can promote health and wellness. Preventive health guidelines for men include the following key practices:  A routine yearly physical is a good way to check with your health care provider about your health and preventative screening. It is a chance to share any concerns and updates on your health and to receive a thorough exam.  Visit your dentist for a routine exam and preventative care every 6 months. Brush your teeth twice a day and floss once a day. Good oral hygiene prevents tooth decay and gum disease.  The frequency of eye exams is based on your age, health, family medical history, use of contact lenses, and other factors. Follow your health care provider's recommendations for frequency of eye exams.  Eat a healthy diet. Foods such as vegetables, fruits, whole grains, low-fat dairy products, and lean protein foods contain the nutrients you need without too many calories. Decrease your intake of foods high in solid fats, added sugars, and salt. Eat the right amount of calories for you.Get information about a proper diet from your health care provider, if necessary.  Regular physical exercise is one of the most important things you can do for your health. Most adults should get at least 150 minutes of moderate-intensity exercise (any activity that increases your heart rate and causes you to sweat) each week. In addition, most adults need muscle-strengthening exercises on 2 or more days a week.  Maintain a healthy weight. The body mass index (BMI) is a screening tool to identify possible weight problems. It provides an estimate of body fat based on height and weight. Your health care provider can find your BMI and can help you achieve or maintain a healthy weight.For adults 20 years and older:  A BMI below 18.5 is considered underweight.  A BMI of 18.5 to 24.9 is normal.  A BMI of 25 to 29.9 is considered overweight.  A BMI  of 30 and above is considered obese.  Maintain normal blood lipids and cholesterol levels by exercising and minimizing your intake of saturated fat. Eat a balanced diet with plenty of fruit and vegetables. Blood tests for lipids and cholesterol should begin at age 50 and be repeated every 5 years. If your lipid or cholesterol levels are high, you are over 50, or you are at high risk for heart disease, you may need your cholesterol levels checked more frequently.Ongoing high lipid and cholesterol levels should be treated with medicines if diet and exercise are not working.  If you smoke, find out from your health care provider how to quit. If you do not use tobacco, do not start.  Lung cancer screening is recommended for adults aged 73-80 years who are at high risk for developing lung cancer because of a history of smoking. A yearly low-dose CT scan of the lungs is recommended for people who have at least a 30-pack-year history of smoking and are a current smoker or have quit within the past 15 years. A pack year of smoking is smoking an average of 1 pack of cigarettes a day for 1 year (for example: 1 pack a day for 30 years or 2 packs a day for 15 years). Yearly screening should continue until the smoker has stopped smoking for at least 15 years. Yearly screening should be stopped for people who develop a health problem that would prevent them from having lung cancer treatment.  If you choose to drink alcohol, do not have more than  2 drinks per day. One drink is considered to be 12 ounces (355 mL) of beer, 5 ounces (148 mL) of wine, or 1.5 ounces (44 mL) of liquor.  Avoid use of street drugs. Do not share needles with anyone. Ask for help if you need support or instructions about stopping the use of drugs.  High blood pressure causes heart disease and increases the risk of stroke. Your blood pressure should be checked at least every 1-2 years. Ongoing high blood pressure should be treated with  medicines, if weight loss and exercise are not effective.  If you are 45-79 years old, ask your health care provider if you should take aspirin to prevent heart disease.  Diabetes screening involves taking a blood sample to check your fasting blood sugar level. This should be done once every 3 years, after age 45, if you are within normal weight and without risk factors for diabetes. Testing should be considered at a younger age or be carried out more frequently if you are overweight and have at least 1 risk factor for diabetes.  Colorectal cancer can be detected and often prevented. Most routine colorectal cancer screening begins at the age of 50 and continues through age 75. However, your health care provider may recommend screening at an earlier age if you have risk factors for colon cancer. On a yearly basis, your health care provider may provide home test kits to check for hidden blood in the stool. Use of a small camera at the end of a tube to directly examine the colon (sigmoidoscopy or colonoscopy) can detect the earliest forms of colorectal cancer. Talk to your health care provider about this at age 50, when routine screening begins. Direct exam of the colon should be repeated every 5-10 years through age 75, unless early forms of precancerous polyps or small growths are found.  People who are at an increased risk for hepatitis B should be screened for this virus. You are considered at high risk for hepatitis B if:  You were born in a country where hepatitis B occurs often. Talk with your health care provider about which countries are considered high risk.  Your parents were born in a high-risk country and you have not received a shot to protect against hepatitis B (hepatitis B vaccine).  You have HIV or AIDS.  You use needles to inject street drugs.  You live with, or have sex with, someone who has hepatitis B.  You are a man who has sex with other men (MSM).  You get hemodialysis  treatment.  You take certain medicines for conditions such as cancer, organ transplantation, and autoimmune conditions.  Hepatitis C blood testing is recommended for all people born from 1945 through 1965 and any individual with known risks for hepatitis C.  Practice safe sex. Use condoms and avoid high-risk sexual practices to reduce the spread of sexually transmitted infections (STIs). STIs include gonorrhea, chlamydia, syphilis, trichomonas, herpes, HPV, and human immunodeficiency virus (HIV). Herpes, HIV, and HPV are viral illnesses that have no cure. They can result in disability, cancer, and death.  If you are at risk of being infected with HIV, it is recommended that you take a prescription medicine daily to prevent HIV infection. This is called preexposure prophylaxis (PrEP). You are considered at risk if:  You are a man who has sex with other men (MSM) and have other risk factors.  You are a heterosexual man, are sexually active, and are at increased risk for HIV infection.    You take drugs by injection.  You are sexually active with a partner who has HIV.  Talk with your health care provider about whether you are at high risk of being infected with HIV. If you choose to begin PrEP, you should first be tested for HIV. You should then be tested every 3 months for as long as you are taking PrEP.  A one-time screening for abdominal aortic aneurysm (AAA) and surgical repair of large AAAs by ultrasound are recommended for men ages 32 to 67 years who are current or former smokers.  Healthy men should no longer receive prostate-specific antigen (PSA) blood tests as part of routine cancer screening. Talk with your health care provider about prostate cancer screening.  Testicular cancer screening is not recommended for adult males who have no symptoms. Screening includes self-exam, a health care provider exam, and other screening tests. Consult with your health care provider about any symptoms  you have or any concerns you have about testicular cancer.  Use sunscreen. Apply sunscreen liberally and repeatedly throughout the day. You should seek shade when your shadow is shorter than you. Protect yourself by wearing long sleeves, pants, a wide-brimmed hat, and sunglasses year round, whenever you are outdoors.  Once a month, do a whole-body skin exam, using a mirror to look at the skin on your back. Tell your health care provider about new moles, moles that have irregular borders, moles that are larger than a pencil eraser, or moles that have changed in shape or color.  Stay current with required vaccines (immunizations).  Influenza vaccine. All adults should be immunized every year.  Tetanus, diphtheria, and acellular pertussis (Td, Tdap) vaccine. An adult who has not previously received Tdap or who does not know his vaccine status should receive 1 dose of Tdap. This initial dose should be followed by tetanus and diphtheria toxoids (Td) booster doses every 10 years. Adults with an unknown or incomplete history of completing a 3-dose immunization series with Td-containing vaccines should begin or complete a primary immunization series including a Tdap dose. Adults should receive a Td booster every 10 years.  Varicella vaccine. An adult without evidence of immunity to varicella should receive 2 doses or a second dose if he has previously received 1 dose.  Human papillomavirus (HPV) vaccine. Males aged 68-21 years who have not received the vaccine previously should receive the 3-dose series. Males aged 22-26 years may be immunized. Immunization is recommended through the age of 6 years for any male who has sex with males and did not get any or all doses earlier. Immunization is recommended for any person with an immunocompromised condition through the age of 49 years if he did not get any or all doses earlier. During the 3-dose series, the second dose should be obtained 4-8 weeks after the first  dose. The third dose should be obtained 24 weeks after the first dose and 16 weeks after the second dose.  Zoster vaccine. One dose is recommended for adults aged 50 years or older unless certain conditions are present.  Measles, mumps, and rubella (MMR) vaccine. Adults born before [birth year] generally are considered immune to measles and mumps. Adults born in [birth year] or later should have 1 or more doses of MMR vaccine unless there is a contraindication to the vaccine or there is laboratory evidence of immunity to each of the three diseases. A routine second dose of MMR vaccine should be obtained at least 28 days after the first dose for students attending postsecondary  schools, health care workers, or international travelers. People who received inactivated measles vaccine or an unknown type of measles vaccine during 1963-1967 should receive 2 doses of MMR vaccine. People who received inactivated mumps vaccine or an unknown type of mumps vaccine before 1979 and are at high risk for mumps infection should consider immunization with 2 doses of MMR vaccine. Unvaccinated health care workers born before 1957 who lack laboratory evidence of measles, mumps, or rubella immunity or laboratory confirmation of disease should consider measles and mumps immunization with 2 doses of MMR vaccine or rubella immunization with 1 dose of MMR vaccine.  Pneumococcal 13-valent conjugate (PCV13) vaccine. When indicated, a person who is uncertain of his immunization history and has no record of immunization should receive the PCV13 vaccine. An adult aged 19 years or older who has certain medical conditions and has not been previously immunized should receive 1 dose of PCV13 vaccine. This PCV13 should be followed with a dose of pneumococcal polysaccharide (PPSV23) vaccine. The PPSV23 vaccine dose should be obtained at least 8 weeks after the dose of PCV13 vaccine. An adult aged 19 years or older who has certain medical conditions and  previously received 1 or more doses of PPSV23 vaccine should receive 1 dose of PCV13. The PCV13 vaccine dose should be obtained 1 or more years after the last PPSV23 vaccine dose.  Pneumococcal polysaccharide (PPSV23) vaccine. When PCV13 is also indicated, PCV13 should be obtained first. All adults aged 65 years and older should be immunized. An adult younger than age 65 years who has certain medical conditions should be immunized. Any person who resides in a nursing home or long-term care facility should be immunized. An adult smoker should be immunized. People with an immunocompromised condition and certain other conditions should receive both PCV13 and PPSV23 vaccines. People with human immunodeficiency virus (HIV) infection should be immunized as soon as possible after diagnosis. Immunization during chemotherapy or radiation therapy should be avoided. Routine use of PPSV23 vaccine is not recommended for American Indians, [place] Natives, or people younger than 65 years unless there are medical conditions that require PPSV23 vaccine. When indicated, people who have unknown immunization and have no record of immunization should receive PPSV23 vaccine. One-time revaccination 5 years after the first dose of PPSV23 is recommended for people aged 19-64 years who have chronic kidney failure, nephrotic syndrome, asplenia, or immunocompromised conditions. People who received 1-2 doses of PPSV23 before age 65 years should receive another dose of PPSV23 vaccine at age 65 years or later if at least 5 years have passed since the previous dose. Doses of PPSV23 are not needed for people immunized with PPSV23 at or after age 65 years.  Meningococcal vaccine. Adults with asplenia or persistent complement component deficiencies should receive 2 doses of quadrivalent meningococcal conjugate (MenACWY-D) vaccine. The doses should be obtained at least 2 months apart. Microbiologists working with certain meningococcal bacteria,  military recruits, people at risk during an outbreak, and people who travel to or live in countries with a high rate of meningitis should be immunized. A first-year college student up through age 21 years who is living in a residence hall should receive a dose if he did not receive a dose on or after his 16th birthday. Adults who have certain high-risk conditions should receive one or more doses of vaccine.  Hepatitis A vaccine. Adults who wish to be protected from this disease, have certain high-risk conditions, work with hepatitis A-infected animals, work in hepatitis A research labs, or   travel to or work in countries with a high rate of hepatitis A should be immunized. Adults who were previously unvaccinated and who anticipate close contact with an international adoptee during the first 60 days after arrival in the [place] from a country with a high rate of hepatitis A should be immunized.  Hepatitis B vaccine. Adults should be immunized if they wish to be protected from this disease, have certain high-risk conditions, may be exposed to blood or other infectious body fluids, are household contacts or sex partners of hepatitis B positive people, are clients or workers in certain care facilities, or travel to or work in countries with a high rate of hepatitis B.  Haemophilus influenzae type b (Hib) vaccine. A previously unvaccinated person with asplenia or sickle cell disease or having a scheduled splenectomy should receive 1 dose of Hib vaccine. Regardless of previous immunization, a recipient of a hematopoietic stem cell transplant should receive a 3-dose series 6-12 months after his successful transplant. Hib vaccine is not recommended for adults with HIV infection. Preventive Service / Frequency Ages 52 to 17  Blood pressure check.** / Every 1 to 2 years.  Lipid and cholesterol check.** / Every 5 years beginning at age 69.  Hepatitis C blood test.** / For any individual with known risks for  hepatitis C.  Skin self-exam. / Monthly.  Influenza vaccine. / Every year.  Tetanus, diphtheria, and acellular pertussis (Tdap, Td) vaccine.** / Consult your health care provider. 1 dose of Td every 10 years.  Varicella vaccine.** / Consult your health care provider.  HPV vaccine. / 3 doses over 6 months, if 72 or younger.  Measles, mumps, rubella (MMR) vaccine.** / You need at least 1 dose of MMR if you were born in 1957 or later. You may also need a second dose.  Pneumococcal 13-valent conjugate (PCV13) vaccine.** / Consult your health care provider.  Pneumococcal polysaccharide (PPSV23) vaccine.** / 1 to 2 doses if you smoke cigarettes or if you have certain conditions.  Meningococcal vaccine.** / 1 dose if you are age 35 to 60 years and a Market researcher living in a residence hall, or have one of several medical conditions. You may also need additional booster doses.  Hepatitis A vaccine.** / Consult your health care provider.  Hepatitis B vaccine.** / Consult your health care provider.  Haemophilus influenzae type b (Hib) vaccine.** / Consult your health care provider. Ages 35 to 8  Blood pressure check.** / Every 1 to 2 years.  Lipid and cholesterol check.** / Every 5 years beginning at age 57.  Lung cancer screening. / Every year if you are aged 44-80 years and have a 30-pack-year history of smoking and currently smoke or have quit within the past 15 years. Yearly screening is stopped once you have quit smoking for at least 15 years or develop a health problem that would prevent you from having lung cancer treatment.  Fecal occult blood test (FOBT) of stool. / Every year beginning at age 55 and continuing until age 73. You may not have to do this test if you get a colonoscopy every 10 years.  Flexible sigmoidoscopy** or colonoscopy.** / Every 5 years for a flexible sigmoidoscopy or every 10 years for a colonoscopy beginning at age 28 and continuing until age  1.  Hepatitis C blood test.** / For all people born from [birth year] through 1965 and any individual with known risks for hepatitis C.  Skin self-exam. / Monthly.  Influenza vaccine. / Every  year.  Tetanus, diphtheria, and acellular pertussis (Tdap/Td) vaccine.** / Consult your health care provider. 1 dose of Td every 10 years.  Varicella vaccine.** / Consult your health care provider.  Zoster vaccine.** / 1 dose for adults aged 53 years or older.  Measles, mumps, rubella (MMR) vaccine.** / You need at least 1 dose of MMR if you were born in 1957 or later. You may also need a second dose.  Pneumococcal 13-valent conjugate (PCV13) vaccine.** / Consult your health care provider.  Pneumococcal polysaccharide (PPSV23) vaccine.** / 1 to 2 doses if you smoke cigarettes or if you have certain conditions.  Meningococcal vaccine.** / Consult your health care provider.  Hepatitis A vaccine.** / Consult your health care provider.  Hepatitis B vaccine.** / Consult your health care provider.  Haemophilus influenzae type b (Hib) vaccine.** / Consult your health care provider. Ages 77 and over  Blood pressure check.** / Every 1 to 2 years.  Lipid and cholesterol check.**/ Every 5 years beginning at age 85.  Lung cancer screening. / Every year if you are aged 55-80 years and have a 30-pack-year history of smoking and currently smoke or have quit within the past 15 years. Yearly screening is stopped once you have quit smoking for at least 15 years or develop a health problem that would prevent you from having lung cancer treatment.  Fecal occult blood test (FOBT) of stool. / Every year beginning at age 33 and continuing until age 11. You may not have to do this test if you get a colonoscopy every 10 years.  Flexible sigmoidoscopy** or colonoscopy.** / Every 5 years for a flexible sigmoidoscopy or every 10 years for a colonoscopy beginning at age 28 and continuing until age 73.  Hepatitis C blood  test.** / For all people born from [birth year] through 1965 and any individual with known risks for hepatitis C.  Abdominal aortic aneurysm (AAA) screening.** / A one-time screening for ages 50 to 27 years who are current or former smokers.  Skin self-exam. / Monthly.  Influenza vaccine. / Every year.  Tetanus, diphtheria, and acellular pertussis (Tdap/Td) vaccine.** / 1 dose of Td every 10 years.  Varicella vaccine.** / Consult your health care provider.  Zoster vaccine.** / 1 dose for adults aged 34 years or older.  Pneumococcal 13-valent conjugate (PCV13) vaccine.** / Consult your health care provider.  Pneumococcal polysaccharide (PPSV23) vaccine.** / 1 dose for all adults aged 63 years and older.  Meningococcal vaccine.** / Consult your health care provider.  Hepatitis A vaccine.** / Consult your health care provider.  Hepatitis B vaccine.** / Consult your health care provider.  Haemophilus influenzae type b (Hib) vaccine.** / Consult your health care provider. **Family history and personal history of risk and conditions may change your health care provider's recommendations. Document Released: 04/23/2001 Document Revised: 03/02/2013 Document Reviewed: 07/23/2010 [facility] Patient Information 2015 [place], [place]. This information is not intended to replace advice given to you by your health care provider. Make sure you discuss any questions you have with your health care provider.

## 2014-04-05 NOTE — Progress Notes (Signed)
Pre visit review using our clinic review tool, if applicable. No additional management support is needed unless otherwise documented below in the visit note. 

## 2014-04-05 NOTE — Progress Notes (Signed)
Patient ID: Anthony [name], male   DOB: Dec 09, 1945, 69 y.o.   MRN: [id number]   Anthony [name]  [id number] May 31, 1945 04/05/2014      Progress Note-Follow Up  Subjective  Chief Complaint  Chief Complaint  Patient presents with  . Annual Exam    fasting    HPI  Patient is a 69 y.o. male in today for routine medical care. Patient in for annual wellness exam. Doing well. No recent illness. Has noted some recent urinary frequency. No f/c/abdominal pain. No trips to ED. Denies CP/palp/SOB/HA/congestion/fevers/GI c/o. Taking meds as prescribed. Doing well at home, no trouble with ADLs depression, falls, etc.  Past Medical History  Diagnosis Date  . GERD (gastroesophageal reflux disease)   . Hyperlipidemia   . Hypertension     8-10 years  . Cataract   . CAD (coronary artery disease) March 2011    with a non-ST elevation myocardial infarction. cardiac catherization whic revealed-left main was normal. The LAD had proximal calcification. There was long proximal 30% stenosis. Circumflex inthe AV groove had  proximal  60% stenosis at a mid obtuse marginal. This first large mid obtuse marginal had ostial 70% stenosis. The right coronary artery had a proximal long 50% stenosis.   . Kidney stone   . External hemorrhoids     large external  . Kidney stones 07/20/2012  . Personal history of colonic polyps 04/05/2013    Past Surgical History  Procedure Laterality Date  . Cataract extraction Bilateral 2013  . Coronary angioplasty with stent placement  2011  . Colonoscopy  2009    polyp-[facility], 2015- x4 polyps removed  . Nephrolithotomy Left 09/29/2013    Procedure: NEPHROLITHOTOMY PERCUTANEOUS WITH ACCESS;  Surgeon: Alexis Frock, MD;  Location: [facility];  Service: Urology;  Laterality: Left;  . Cystoscopy w/ retrogrades Bilateral 09/29/2013    Procedure: CYSTOSCOPY WITH RETROGRADE PYELOGRAM;  Surgeon: Alexis Frock, MD;  Location: [facility];  Service: Urology;  Laterality: Bilateral;  .  Ureteroscopy N/A 09/29/2013    Procedure: URETEROSCOPY;  Surgeon: Alexis Frock, MD;  Location: [facility];  Service: Urology;  Laterality: N/A;    Family History  Problem Relation Age of Onset  . Coronary artery disease Father     in his 69's  . Heart disease Mother     deceased of heart disease  . Heart disease Sister      2  older sisters  . Other Neg Hx     no lung cancer, colon cancer, or prostate  . Colon cancer Neg Hx     History   Social History  . Marital Status: Married    Spouse Name: N/A    Number of Children: N/A  . Years of Education: N/A   Occupational History  . Not on file.   Social History Main Topics  . Smoking status: Former Smoker    Quit date: 03/12/2007  . Smokeless tobacco: Never Used     Comment: 40 pack year history  . Alcohol Use: No  . Drug Use: No  . Sexual Activity: Not on file   Other Topics Concern  . Not on file   Social History Narrative   married -21 yrs  [name] [place])   grew up in [place]   He has one son - two grandaugthers     He is retired.  He had     a 40 pack-year smoking history but has discontinued.     prev occuptation -  [employer]  Alcohol use-no   Smoking Status:  quit   Packs/Day:  0.5   Caffeine use/day:  2 beverages daily   Does Patient Exercise:  yes             Current Outpatient Prescriptions on File Prior to Visit  Medication Sig Dispense Refill  . aspirin 81 MG tablet Take 81 mg by mouth daily.     . cetirizine (ZYRTEC ALLERGY) 10 MG tablet Take 10 mg by mouth daily.    . metoprolol tartrate (LOPRESSOR) 25 MG tablet Take 12.5 mg by mouth 2 (two) times daily.    . Multiple Vitamins-Minerals (ICAPS PO) Take by mouth daily.    . rosuvastatin (CRESTOR) 40 MG tablet Take 40 mg by mouth daily.     No current facility-administered medications on file prior to visit.    No Known Allergies  Review of Systems  Review of Systems  Constitutional: Negative for fever, chills and  malaise/fatigue.  HENT: Negative for congestion, hearing loss and nosebleeds.   Eyes: Negative for discharge.  Respiratory: Negative for cough, sputum production, shortness of breath and wheezing.   Cardiovascular: Negative for chest pain, palpitations and leg swelling.  Gastrointestinal: Negative for heartburn, nausea, vomiting, abdominal pain, diarrhea, constipation and blood in stool.  Genitourinary: Negative for dysuria, urgency, frequency and hematuria.  Musculoskeletal: Negative for myalgias, back pain and falls.  Skin: Negative for rash.  Neurological: Negative for dizziness, tremors, sensory change, focal weakness, loss of consciousness, weakness and headaches.  Endo/Heme/Allergies: Negative for polydipsia. Does not bruise/bleed easily.  Psychiatric/Behavioral: Negative for depression and suicidal ideas. The patient is not nervous/anxious and does not have insomnia.     Objective  BP 144/73 mmHg  Pulse 67  Temp(Src) 97.7 F (36.5 C) (Oral)  Ht 5\' 4"  (1.626 m)  Wt 175 lb 6.4 oz (79.561 kg)  BMI 30.09 kg/m2  SpO2 97%  Physical Exam  Physical Exam  Constitutional: He is oriented to person, place, and time and well-developed, well-nourished, and in no distress. No distress.  HENT:  Head: Normocephalic and atraumatic.  Eyes: Conjunctivae are normal.  Neck: Neck supple. No thyromegaly present.  Cardiovascular: Normal rate, regular rhythm and normal heart sounds.   No murmur heard. Pulmonary/Chest: Effort normal and breath sounds normal. No respiratory distress.  Abdominal: He exhibits no distension and no mass. There is no tenderness.  Musculoskeletal: He exhibits no edema.  Neurological: He is alert and oriented to person, place, and time.  Skin: Skin is warm.  Psychiatric: Memory, affect and judgment normal.    Lab Results  Component Value Date   TSH 2.892 03/24/2013   Lab Results  Component Value Date   WBC 7.6 09/22/2013   HGB 12.6* 09/30/2013   HCT 36.4*  09/30/2013   MCV 90.2 09/22/2013   PLT 244 09/22/2013   Lab Results  Component Value Date   CREATININE 0.92 09/30/2013   BUN 14 09/30/2013   NA 136* 09/30/2013   K 4.1 09/30/2013   CL 100 09/30/2013   CO2 24 09/30/2013   Lab Results  Component Value Date   ALT 20 03/24/2013   AST 18 03/24/2013   ALKPHOS 74 03/24/2013   BILITOT 0.6 03/24/2013   Lab Results  Component Value Date   CHOL 101 03/24/2013   Lab Results  Component Value Date   HDL 35* 03/24/2013   Lab Results  Component Value Date   LDLCALC 22 03/24/2013   Lab Results  Component Value Date   TRIG 219*  03/24/2013   Lab Results  Component Value Date   CHOLHDL 2.9 03/24/2013     Assessment & Plan  Essential hypertension Mildly elevated. Increase Amlodipine to 10 mg daily   GERD Avoid offending foods, start probiotics. Do not eat large meals in late evening and consider raising head of bed.    Hyperlipidemia, mixed Tolerating statin, encouraged heart healthy diet, avoid trans fats, minimize simple carbs and saturated fats. Increase exercise as tolerated   Encounter for Medicare annual wellness exam Patient denies any difficulties at home. No trouble with ADLs, depression or falls. No recent changes to vision or hearing. Is UTD with immunizations. Is UTD with screening. Discussed Advanced Directives, patient agrees to bring [place] copies of documents if can. Encouraged heart healthy diet, exercise as tolerated and adequate sleep. Follows with dermatology, Dr Maisie Fus with cardiology, Dr Rance Muir with gastroenterology, Dr Hilarie Fredrickson  annual labs reviewed   Hyperglycemia hgba1c acceptable, minimize simple carbs. Increase exercise as tolerated. Continue current meds

## 2014-04-06 LAB — URINE CULTURE
COLONY COUNT: NO GROWTH
Organism ID, Bacteria: NO GROWTH

## 2014-04-07 ENCOUNTER — Other Ambulatory Visit: Payer: Self-pay | Admitting: Cardiology

## 2014-04-07 NOTE — Telephone Encounter (Signed)
Rx has been sent to the pharmacy electronically. ° °

## 2014-04-08 ENCOUNTER — Encounter: Payer: Self-pay | Admitting: *Deleted

## 2014-04-10 ENCOUNTER — Encounter: Payer: Self-pay | Admitting: Family Medicine

## 2014-04-10 NOTE — Assessment & Plan Note (Signed)
Avoid offending foods, start probiotics. Do not eat large meals in late evening and consider raising head of bed.  

## 2014-04-10 NOTE — Assessment & Plan Note (Signed)
Tolerating statin, encouraged heart healthy diet, avoid trans fats, minimize simple carbs and saturated fats. Increase exercise as tolerated 

## 2014-04-10 NOTE — Assessment & Plan Note (Signed)
Patient denies any difficulties at home. No trouble with ADLs, depression or falls. No recent changes to vision or hearing. Is UTD with immunizations. Is UTD with screening. Discussed Advanced Directives, patient agrees to bring [place] copies of documents if can. Encouraged heart healthy diet, exercise as tolerated and adequate sleep. Follows with dermatology, Dr Maisie Fus with cardiology, Dr Rance Muir with gastroenterology, Dr Hilarie Fredrickson  annual labs reviewed

## 2014-04-10 NOTE — Assessment & Plan Note (Signed)
Mildly elevated. Increase Amlodipine to 10 mg daily

## 2014-04-10 NOTE — Assessment & Plan Note (Signed)
hgba1c acceptable, minimize simple carbs. Increase exercise as tolerated. Continue current meds 

## 2014-05-23 DIAGNOSIS — R310 Gross hematuria: Secondary | ICD-10-CM | POA: Diagnosis not present

## 2014-05-23 DIAGNOSIS — N200 Calculus of kidney: Secondary | ICD-10-CM | POA: Diagnosis not present

## 2014-05-23 DIAGNOSIS — R8299 Other abnormal findings in urine: Secondary | ICD-10-CM | POA: Diagnosis not present

## 2014-07-06 ENCOUNTER — Encounter: Payer: Self-pay | Admitting: Physician Assistant

## 2014-07-06 ENCOUNTER — Ambulatory Visit (INDEPENDENT_AMBULATORY_CARE_PROVIDER_SITE_OTHER): Payer: Medicare Other | Admitting: Physician Assistant

## 2014-07-06 VITALS — BP 136/74 | HR 73 | Temp 98.2°F | Resp 18 | Ht 65.0 in | Wt 175.0 lb

## 2014-07-06 DIAGNOSIS — R05 Cough: Secondary | ICD-10-CM

## 2014-07-06 DIAGNOSIS — I6523 Occlusion and stenosis of bilateral carotid arteries: Secondary | ICD-10-CM

## 2014-07-06 DIAGNOSIS — R058 Other specified cough: Secondary | ICD-10-CM | POA: Insufficient documentation

## 2014-07-06 MED ORDER — BENZONATATE 200 MG PO CAPS: 200.0000 mg | ORAL_CAPSULE | Freq: Two times a day (BID) | ORAL | Status: DC | PRN

## 2014-07-06 MED ORDER — AZELASTINE-FLUTICASONE 137-50 MCG/ACT NA SUSP: 1.0000 | Freq: Two times a day (BID) | NASAL | Status: DC

## 2014-07-06 NOTE — Progress Notes (Signed)
Patient presents to clinic today c/o "tickle in throat" x 2 weeks associated with mild cough and nasal congestion.  Patient with history of seasonal allergies. Denies chest congestion or SOB.  Has taken Mucinex with little relief of symptoms.  Past Medical History  Diagnosis Date  . GERD (gastroesophageal reflux disease)   . Hyperlipidemia   . Hypertension     8-10 years  . Cataract   . CAD (coronary artery disease) March 2011    with a non-ST elevation myocardial infarction. cardiac catherization whic revealed-left main was normal. The LAD had proximal calcification. There was long proximal 30% stenosis. Circumflex inthe AV groove had  proximal  60% stenosis at a mid obtuse marginal. This first large mid obtuse marginal had ostial 70% stenosis. The right coronary artery had a proximal long 50% stenosis.   . Kidney stone   . External hemorrhoids     large external  . Kidney stones 07/20/2012  . Personal history of colonic polyps 04/05/2013    Current Outpatient Prescriptions on File Prior to Visit  Medication Sig Dispense Refill  . amLODipine (NORVASC) 10 MG tablet Take 1 tablet (10 mg total) by mouth every morning. 90 tablet 1  . aspirin 81 MG tablet Take 81 mg by mouth daily.     . cetirizine (ZYRTEC ALLERGY) 10 MG tablet Take 10 mg by mouth daily.    . CRESTOR 40 MG tablet Take 1 tablet by mouth  daily 90 tablet 1  . losartan (COZAAR) 100 MG tablet Take 1 tablet (100 mg total) by mouth every morning. 90 tablet 1  . metoprolol tartrate (LOPRESSOR) 25 MG tablet Take one-half tablet by  mouth twice a day 90 tablet 1  . Multiple Vitamins-Minerals (ICAPS PO) Take by mouth daily.     No current facility-administered medications on file prior to visit.    No Known Allergies  Family History  Problem Relation Age of Onset  . Coronary artery disease Father     in his 43's  . Hyperlipidemia Father   . Hypertension Father   . Hearing loss Father   . Heart disease Mother    deceased of heart disease  . Hypertension Mother   . Hyperlipidemia Mother   . Other Neg Hx     no lung cancer, colon cancer, or prostate  . Colon cancer Neg Hx   . Multiple sclerosis Son   . Heart disease Maternal Grandmother   . Heart disease Maternal Grandfather   . Heart disease Paternal Grandmother   . Heart disease Paternal Grandfather   . GER disease Sister     History   Social History  . Marital Status: Married    Spouse Name: N/A  . Number of Children: N/A  . Years of Education: N/A   Social History Main Topics  . Smoking status: Former Smoker    Quit date: 03/12/2007  . Smokeless tobacco: Never Used     Comment: 40 pack year history  . Alcohol Use: No  . Drug Use: No  . 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 Not on file   Social History Narrative   married -16 yrs  [name] [place])   grew up in [place]   He has one son - two grandaugthers     He is retired.  He had     a 40 pack-year smoking history but  has discontinued.     prev occuptation -  [employer]   Alcohol use-no   Smoking Status:  quit   Packs/Day:  0.5   Caffeine use/day:  2 beverages daily   Does Patient Exercise:  yes            Review of Systems - See HPI.  All other ROS are negative.  BP 136/74 mmHg  Pulse 73  Temp(Src) 98.2 F (36.8 C) (Oral)  Resp 18  Ht 5\' 5"  (1.651 m)  Wt 175 lb (79.379 kg)  BMI 29.12 kg/m2  SpO2 98%  Physical Exam  Constitutional: He is oriented to person, place, and time and well-developed, well-nourished, and in no distress.  HENT:  Head: Normocephalic and atraumatic.  Right Ear: Tympanic membrane, external ear and ear canal normal.  Left Ear: Tympanic membrane, external ear and ear canal normal.  Nose: Rhinorrhea present.  Mouth/Throat: Uvula is midline, oropharynx is clear and moist and mucous membranes are normal.  Eyes: Conjunctivae are normal.    Cardiovascular: Normal rate, regular rhythm, normal heart sounds and intact distal pulses.   Pulmonary/Chest: Effort normal and breath sounds normal. No respiratory distress. He has no wheezes. He has no rales. He exhibits no tenderness.  Neurological: He is alert and oriented to person, place, and time.  Skin: Skin is warm and dry. No rash noted.  Psychiatric: Affect normal.  Vitals reviewed.   No results found for this or any previous visit (from the past 2160 hour(s)).  Assessment/Plan: Allergic cough Rx Dymista. Zyrtec nightly. Rx tessalon perles.  Supportive measures discussed.  Follow-up if not improving.

## 2014-07-06 NOTE — Progress Notes (Signed)
Pre visit review using our clinic review tool, if applicable. No additional management support is needed unless otherwise documented below in the visit note. 

## 2014-07-06 NOTE — Patient Instructions (Signed)
Please stay well hydrated. Use the Tessalon as directed for cough. Start a nightly Zyrtec and use the Dymista spray as directed. Continue Saline nasal spray. Symptoms should begin to improver over next few days.  If not improving, please give me a call.

## 2014-07-06 NOTE — Assessment & Plan Note (Signed)
Rx Dymista. Zyrtec nightly. Rx tessalon perles.  Supportive measures discussed.  Follow-up if not improving.

## 2014-08-30 DIAGNOSIS — Z961 Presence of intraocular lens: Secondary | ICD-10-CM | POA: Diagnosis not present

## 2014-08-30 DIAGNOSIS — H3531 Nonexudative age-related macular degeneration: Secondary | ICD-10-CM | POA: Diagnosis not present

## 2014-08-30 DIAGNOSIS — H35721 Serous detachment of retinal pigment epithelium, right eye: Secondary | ICD-10-CM | POA: Diagnosis not present

## 2014-09-12 ENCOUNTER — Other Ambulatory Visit: Payer: Self-pay | Admitting: Family Medicine

## 2014-09-12 ENCOUNTER — Other Ambulatory Visit: Payer: Self-pay | Admitting: Cardiology

## 2014-10-04 ENCOUNTER — Encounter: Payer: Self-pay | Admitting: Family Medicine

## 2014-10-04 ENCOUNTER — Ambulatory Visit (INDEPENDENT_AMBULATORY_CARE_PROVIDER_SITE_OTHER): Payer: Medicare Other | Admitting: Family Medicine

## 2014-10-04 VITALS — BP 132/82 | HR 63 | Temp 97.7°F | Ht 66.0 in | Wt 177.2 lb

## 2014-10-04 DIAGNOSIS — I10 Essential (primary) hypertension: Secondary | ICD-10-CM | POA: Diagnosis not present

## 2014-10-04 DIAGNOSIS — K219 Gastro-esophageal reflux disease without esophagitis: Secondary | ICD-10-CM

## 2014-10-04 DIAGNOSIS — R739 Hyperglycemia, unspecified: Secondary | ICD-10-CM

## 2014-10-04 DIAGNOSIS — E782 Mixed hyperlipidemia: Secondary | ICD-10-CM

## 2014-10-04 LAB — LIPID PANEL
Cholesterol: 108 mg/dL (ref 0–200)
HDL: 39 mg/dL — ABNORMAL LOW
LDL Cholesterol: 38 mg/dL (ref 0–99)
NonHDL: 69
Total CHOL/HDL Ratio: 3
Triglycerides: 157 mg/dL — ABNORMAL HIGH (ref 0.0–149.0)
VLDL: 31.4 mg/dL (ref 0.0–40.0)

## 2014-10-04 LAB — COMPREHENSIVE METABOLIC PANEL WITH GFR
ALT: 22 U/L (ref 0–53)
AST: 18 U/L (ref 0–37)
Albumin: 4.4 g/dL (ref 3.5–5.2)
Alkaline Phosphatase: 71 U/L (ref 39–117)
BUN: 11 mg/dL (ref 6–23)
CO2: 28 meq/L (ref 19–32)
Calcium: 9.3 mg/dL (ref 8.4–10.5)
Chloride: 103 meq/L (ref 96–112)
Creatinine, Ser: 0.81 mg/dL (ref 0.40–1.50)
GFR: 100.34 mL/min
Glucose, Bld: 86 mg/dL (ref 70–99)
Potassium: 3.7 meq/L (ref 3.5–5.1)
Sodium: 139 meq/L (ref 135–145)
Total Bilirubin: 0.7 mg/dL (ref 0.2–1.2)
Total Protein: 7.6 g/dL (ref 6.0–8.3)

## 2014-10-04 LAB — CBC
HCT: 42.1 % (ref 39.0–52.0)
Hemoglobin: 14.4 g/dL (ref 13.0–17.0)
MCHC: 34.3 g/dL (ref 30.0–36.0)
MCV: 93.4 fl (ref 78.0–100.0)
Platelets: 227 K/uL (ref 150.0–400.0)
RBC: 4.51 Mil/uL (ref 4.22–5.81)
RDW: 12.8 % (ref 11.5–15.5)
WBC: 8.5 K/uL (ref 4.0–10.5)

## 2014-10-04 LAB — TSH: TSH: 2.17 u[IU]/mL (ref 0.35–4.50)

## 2014-10-04 LAB — HEMOGLOBIN A1C: Hgb A1c MFr Bld: 5.9 % (ref 4.6–6.5)

## 2014-10-04 NOTE — Progress Notes (Signed)
Pre visit review using our clinic review tool, if applicable. No additional management support is needed unless otherwise documented below in the visit note. 

## 2014-10-04 NOTE — Patient Instructions (Signed)

## 2014-10-16 ENCOUNTER — Encounter: Payer: Self-pay | Admitting: Family Medicine

## 2014-10-16 NOTE — Assessment & Plan Note (Signed)
hgba1c acceptable, minimize simple carbs. Increase exercise as tolerated.  

## 2014-10-16 NOTE — Assessment & Plan Note (Signed)
Avoid offending foods, take probiotics. Do not eat large meals in late evening and consider raising head of bed.  

## 2014-10-16 NOTE — Progress Notes (Signed)
Anthony [name]  [id number] 26-Apr-1945 10/16/2014      Progress Note-Follow Up  Subjective  Chief Complaint  Chief Complaint  Patient presents with  . Follow-up    HPI  Patient is a 69 y.o. male in today for rou Denies CP/palp/SOB/HA/congestion/fevers/GI or GU c/o. Taking meds as prescribedtine medical care. Patient is in today for follow-up. Is feeling very well. No recent illness. He bikes and exercises regularly. He eats a heart healthy diet.No acute concerns.  Past Medical History  Diagnosis Date  . GERD (gastroesophageal reflux disease)   . Hyperlipidemia   . Hypertension     8-10 years  . Cataract   . CAD (coronary artery disease) March 2011    with a non-ST elevation myocardial infarction. cardiac catherization whic revealed-left main was normal. The LAD had proximal calcification. There was long proximal 30% stenosis. Circumflex inthe AV groove had  proximal  60% stenosis at a mid obtuse marginal. This first large mid obtuse marginal had ostial 70% stenosis. The right coronary artery had a proximal long 50% stenosis.   . Kidney stone   . External hemorrhoids     large external  . Kidney stones 07/20/2012  . Personal history of colonic polyps 04/05/2013    Past Surgical History  Procedure Laterality Date  . Cataract extraction Bilateral 2013  . Coronary angioplasty with stent placement  2011  . Colonoscopy  2009    [facility], 2015- x4 polyps removed  . Nephrolithotomy Left 09/29/2013    Procedure: NEPHROLITHOTOMY PERCUTANEOUS WITH ACCESS;  Surgeon: Alexis Frock, MD;  Location: [facility];  Service: Urology;  Laterality: Left;  . Cystoscopy w/ retrogrades Bilateral 09/29/2013    Procedure: CYSTOSCOPY WITH RETROGRADE PYELOGRAM;  Surgeon: Alexis Frock, MD;  Location: [facility];  Service: Urology;  Laterality: Bilateral;  . Ureteroscopy N/A 09/29/2013    Procedure: URETEROSCOPY;  Surgeon: Alexis Frock, MD;  Location: [facility];  Service: Urology;  Laterality: N/A;     Family History  Problem Relation Age of Onset  . Coronary artery disease Father     in his 17's  . Hyperlipidemia Father   . Hypertension Father   . Hearing loss Father   . Heart disease Mother     deceased of heart disease  . Hypertension Mother   . Hyperlipidemia Mother   . Other Neg Hx     no lung cancer, colon cancer, or prostate  . Colon cancer Neg Hx   . Multiple sclerosis Son   . Heart disease Maternal Grandmother   . Heart disease Maternal Grandfather   . Heart disease Paternal Grandmother   . Heart disease Paternal Grandfather   . GER disease Sister     History   Social History  . Marital Status: Married    Spouse Name: N/A  . Number of Children: N/A  . Years of Education: N/A   Occupational History  . Not on file.   Social History Main Topics  . Smoking status: Former Smoker    Quit date: 03/12/2007  . Smokeless tobacco: Never Used     Comment: 40 pack year history  . Alcohol Use: No  . Drug Use: No  . 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 Not on file   Social History Narrative   married -12 yrs  [name] [place])   grew up in [place]   He has one son -  two grandaugthers     He is retired.  He had     a 40 pack-year smoking history but has discontinued.     prev occuptation -  [employer]   Alcohol use-no   Smoking Status:  quit   Packs/Day:  0.5   Caffeine use/day:  2 beverages daily   Does Patient Exercise:  yes             Current Outpatient Prescriptions on File Prior to Visit  Medication Sig Dispense Refill  . amLODipine (NORVASC) 10 MG tablet Take 1 tablet by mouth  every morning 90 tablet 1  . aspirin 81 MG tablet Take 81 mg by mouth daily.     . CRESTOR 40 MG tablet Take 1 tablet by mouth  daily 90 tablet 0  . losartan (COZAAR) 100 MG tablet Take 1 tablet by mouth  every morning 90 tablet 1  . metoprolol tartrate (LOPRESSOR) 25  MG tablet Take one-half tablet by  mouth twice a day 90 tablet 0  . Multiple Vitamins-Minerals (ICAPS PO) Take by mouth daily.     No current facility-administered medications on file prior to visit.    No Known Allergies  Review of Systems  Review of Systems  Constitutional: Negative for fever and malaise/fatigue.  HENT: Negative for congestion.   Eyes: Negative for discharge.  Respiratory: Negative for shortness of breath.   Cardiovascular: Negative for chest pain, palpitations and leg swelling.  Gastrointestinal: Negative for nausea, abdominal pain and diarrhea.  Genitourinary: Negative for dysuria.  Musculoskeletal: Negative for falls.  Skin: Negative for rash.  Neurological: Negative for loss of consciousness and headaches.  Endo/Heme/Allergies: Negative for polydipsia.  Psychiatric/Behavioral: Negative for depression and suicidal ideas. The patient is not nervous/anxious and does not have insomnia.     Objective  BP 132/82 mmHg  Pulse 63  Temp(Src) 97.7 F (36.5 C) (Oral)  Ht 5\' 6"  (1.676 m)  Wt 177 lb 4 oz (80.4 kg)  BMI 28.62 kg/m2  SpO2 95%  Physical Exam  Physical Exam  Constitutional: He is oriented to person, place, and time and well-developed, well-nourished, and in no distress. No distress.  HENT:  Head: Normocephalic and atraumatic.  Eyes: Conjunctivae are normal.  Neck: Neck supple. No thyromegaly present.  Cardiovascular: Normal rate, regular rhythm and normal heart sounds.   No murmur heard. Pulmonary/Chest: Effort normal and breath sounds normal. No respiratory distress.  Abdominal: He exhibits no distension and no mass. There is no tenderness.  Musculoskeletal: He exhibits no edema.  Neurological: He is alert and oriented to person, place, and time.  Skin: Skin is warm.  Psychiatric: Memory, affect and judgment normal.    Lab Results  Component Value Date   TSH 2.17 10/04/2014   Lab Results  Component Value Date   WBC 8.5 10/04/2014    HGB 14.4 10/04/2014   HCT 42.1 10/04/2014   MCV 93.4 10/04/2014   PLT 227.0 10/04/2014   Lab Results  Component Value Date   CREATININE 0.81 10/04/2014   BUN 11 10/04/2014   NA 139 10/04/2014   K 3.7 10/04/2014   CL 103 10/04/2014   CO2 28 10/04/2014   Lab Results  Component Value Date   ALT 22 10/04/2014   AST 18 10/04/2014   ALKPHOS 71 10/04/2014   BILITOT 0.7 10/04/2014   Lab Results  Component Value Date   CHOL 108 10/04/2014   Lab Results  Component Value Date   HDL 39.00* 10/04/2014  Lab Results  Component Value Date   LDLCALC 38 10/04/2014   Lab Results  Component Value Date   TRIG 157.0* 10/04/2014   Lab Results  Component Value Date   CHOLHDL 3 10/04/2014     Assessment & Plan  Hyperglycemia hgba1c acceptable, minimize simple carbs. Increase exercise as tolerated.  GERD Avoid offending foods, take probiotics. Do not eat large meals in late evening and consider raising head of bed.   Essential hypertension Well controlled, no changes to meds. Encouraged heart healthy diet such as the DASH diet and exercise as tolerated.   Hyperlipidemia, mixed Tolerating statin, encouraged heart healthy diet, avoid trans fats, minimize simple carbs and saturated fats. Increase exercise as tolerated

## 2014-10-16 NOTE — Assessment & Plan Note (Signed)
Tolerating statin, encouraged heart healthy diet, avoid trans fats, minimize simple carbs and saturated fats. Increase exercise as tolerated 

## 2014-10-16 NOTE — Assessment & Plan Note (Signed)
Well controlled, no changes to meds. Encouraged heart healthy diet such as the DASH diet and exercise as tolerated.  °

## 2014-11-17 DIAGNOSIS — R8299 Other abnormal findings in urine: Secondary | ICD-10-CM | POA: Diagnosis not present

## 2014-11-17 DIAGNOSIS — Z125 Encounter for screening for malignant neoplasm of prostate: Secondary | ICD-10-CM | POA: Diagnosis not present

## 2014-11-17 DIAGNOSIS — N200 Calculus of kidney: Secondary | ICD-10-CM | POA: Diagnosis not present

## 2014-11-29 NOTE — Progress Notes (Signed)
HPI: FU CAD; admitted in March 2011 with a non-ST elevation myocardial infarction. He underwent cardiac catheterization which revealed - Left main was normal. The LAD had proximal calcification. There was long proximal 30% stenosis. Circumflex in the AV groove had proximal 60% stenosis at a mid obtuse marginal. This first large mid obtuse marginal had ostial 70% stenosis. The right coronary artery had a proximal long 50% stenosis. There was mid subtotal stenosis. The EF was 50% with inferior hypokinesis. Patient had a drug-eluting stent to the right coronary artery at that time. Abdominal ultrasound in July of 2011 showed no aneurysm. Nuclear study in December of 2013 showed an ejection fraction of 71% and normal perfusion. Carotid Dopplers in Jan 2016 showed a 40-59% bilateral stenosis. Followup recommended in one year. Since I last saw him, the patient denies any dyspnea on exertion, orthopnea, PND, pedal edema, palpitations, syncope or chest pain.   Current Outpatient Prescriptions  Medication Sig Dispense Refill  . amLODipine (NORVASC) 10 MG tablet Take 1 tablet by mouth  every morning 90 tablet 1  . aspirin 81 MG tablet Take 81 mg by mouth daily.     . CRESTOR 40 MG tablet Take 1 tablet by mouth  daily 90 tablet 0  . losartan (COZAAR) 100 MG tablet Take 1 tablet by mouth  every morning 90 tablet 1  . metoprolol tartrate (LOPRESSOR) 25 MG tablet Take one-half tablet by  mouth twice a day 90 tablet 0  . Multiple Vitamins-Minerals (ICAPS PO) Take by mouth daily.    . potassium citrate (UROCIT-K) 10 MEQ (1080 MG) SR tablet TK 1 T PO TID  3   No current facility-administered medications for this visit.     Past Medical History  Diagnosis Date  . GERD (gastroesophageal reflux disease)   . Hyperlipidemia   . Hypertension     8-10 years  . Cataract   . CAD (coronary artery disease) March 2011    with a non-ST elevation myocardial infarction. cardiac catherization whic revealed-left  main was normal. The LAD had proximal calcification. There was long proximal 30% stenosis. Circumflex inthe AV groove had  proximal  60% stenosis at a mid obtuse marginal. This first large mid obtuse marginal had ostial 70% stenosis. The right coronary artery had a proximal long 50% stenosis.   . Kidney stone   . External hemorrhoids     large external  . Kidney stones 07/20/2012  . Personal history of colonic polyps 04/05/2013    Past Surgical History  Procedure Laterality Date  . Cataract extraction Bilateral 2013  . Coronary angioplasty with stent placement  2011  . Colonoscopy  2009    polyp-[facility], 2015- x4 polyps removed  . Nephrolithotomy Left 09/29/2013    Procedure: NEPHROLITHOTOMY PERCUTANEOUS WITH ACCESS;  Surgeon: Alexis Frock, MD;  Location: [facility];  Service: Urology;  Laterality: Left;  . Cystoscopy w/ retrogrades Bilateral 09/29/2013    Procedure: CYSTOSCOPY WITH RETROGRADE PYELOGRAM;  Surgeon: Alexis Frock, MD;  Location: [facility];  Service: Urology;  Laterality: Bilateral;  . Ureteroscopy N/A 09/29/2013    Procedure: URETEROSCOPY;  Surgeon: Alexis Frock, MD;  Location: [facility];  Service: Urology;  Laterality: N/A;    Social History   Social History  . Marital Status: Married    Spouse Name: N/A  . Number of Children: N/A  . Years of Education: N/A   Occupational History  . Not on file.   Social History Main Topics  . Smoking status:  Former Smoker    Quit date: 03/12/2007  . Smokeless tobacco: Never Used     Comment: 40 pack year history  . Alcohol Use: No  . Drug Use: No  . 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 Not on file   Social History Narrative   married -57 yrs  [name] [place])   grew up in [place]   He has one son - two grandaugthers     He is retired.  He had     a 40 pack-year smoking history but has discontinued.     prev occuptation -  [employer]   Alcohol use-no   Smoking Status:  quit   Packs/Day:  0.5   Caffeine use/day:  2 beverages daily   Does Patient Exercise:  yes             ROS: no fevers or chills, productive cough, hemoptysis, dysphasia, odynophagia, melena, hematochezia, dysuria, hematuria, rash, seizure activity, orthopnea, PND, pedal edema, claudication. Remaining systems are negative.  Physical Exam: Well-developed well-nourished in no acute distress.  Skin is warm and dry.  HEENT is normal.  Neck is supple.  Chest is clear to auscultation with normal expansion.  Cardiovascular exam is regular rate and rhythm.  Abdominal exam nontender or distended. No masses palpated. Extremities show no edema. neuro grossly intact  ECG NSR, first degree AV block

## 2014-11-30 ENCOUNTER — Ambulatory Visit (INDEPENDENT_AMBULATORY_CARE_PROVIDER_SITE_OTHER): Payer: Medicare Other | Admitting: Cardiology

## 2014-11-30 ENCOUNTER — Ambulatory Visit: Payer: Medicare Other

## 2014-11-30 ENCOUNTER — Encounter: Payer: Self-pay | Admitting: Cardiology

## 2014-11-30 ENCOUNTER — Other Ambulatory Visit: Payer: Self-pay

## 2014-11-30 VITALS — BP 134/68 | HR 72 | Ht 66.0 in | Wt 177.0 lb

## 2014-11-30 DIAGNOSIS — I679 Cerebrovascular disease, unspecified: Secondary | ICD-10-CM | POA: Diagnosis not present

## 2014-11-30 DIAGNOSIS — Z23 Encounter for immunization: Secondary | ICD-10-CM

## 2014-11-30 DIAGNOSIS — I10 Essential (primary) hypertension: Secondary | ICD-10-CM | POA: Diagnosis not present

## 2014-11-30 DIAGNOSIS — I6523 Occlusion and stenosis of bilateral carotid arteries: Secondary | ICD-10-CM

## 2014-11-30 DIAGNOSIS — I2510 Atherosclerotic heart disease of native coronary artery without angina pectoris: Secondary | ICD-10-CM | POA: Diagnosis not present

## 2014-11-30 DIAGNOSIS — E782 Mixed hyperlipidemia: Secondary | ICD-10-CM

## 2014-11-30 MED ORDER — ROSUVASTATIN CALCIUM 40 MG PO TABS: ORAL_TABLET | ORAL | Status: DC

## 2014-11-30 MED ORDER — INFLUENZA VAC SPLIT QUAD 0.5 ML IM SUSY
0.5000 mL | PREFILLED_SYRINGE | Freq: Once | INTRAMUSCULAR | Status: AC
  Administered 2014-11-30: 0.5 mL via INTRAMUSCULAR

## 2014-11-30 MED ORDER — METOPROLOL TARTRATE 25 MG PO TABS: ORAL_TABLET | ORAL | Status: DC

## 2014-11-30 NOTE — Patient Instructions (Signed)
Your physician wants you to follow-up in: ONE YEAR WITH DR CRENSHAW You will receive a reminder letter in the mail two months in advance. If you don't receive a letter, please call our office to schedule the follow-up appointment.  

## 2014-11-30 NOTE — Assessment & Plan Note (Signed)
Blood pressure controlled. Continue present medications. 

## 2014-11-30 NOTE — Assessment & Plan Note (Signed)
Continue aspirin and statin. Excellent exercise tolerance with no chest pain.

## 2014-11-30 NOTE — Assessment & Plan Note (Signed)
Continue statin. Recent lipids and liver noted. LDL excellent.

## 2014-11-30 NOTE — Assessment & Plan Note (Signed)
Continue aspirin and statin. FU carotid Dopplers January 2017.

## 2015-03-07 ENCOUNTER — Other Ambulatory Visit: Payer: Self-pay | Admitting: Cardiology

## 2015-03-07 DIAGNOSIS — I6523 Occlusion and stenosis of bilateral carotid arteries: Secondary | ICD-10-CM

## 2015-03-12 ENCOUNTER — Other Ambulatory Visit: Payer: Self-pay | Admitting: Family Medicine

## 2015-03-31 ENCOUNTER — Ambulatory Visit (HOSPITAL_COMMUNITY)
Admission: RE | Admit: 2015-03-31 | Discharge: 2015-03-31 | Disposition: A | Discharge status: Home / Self Care | Payer: Medicare Other | Source: Ambulatory Visit | Attending: Cardiovascular Disease | Admitting: Cardiovascular Disease

## 2015-03-31 DIAGNOSIS — I6523 Occlusion and stenosis of bilateral carotid arteries: Secondary | ICD-10-CM

## 2015-03-31 DIAGNOSIS — I10 Essential (primary) hypertension: Secondary | ICD-10-CM | POA: Insufficient documentation

## 2015-03-31 DIAGNOSIS — E785 Hyperlipidemia, unspecified: Secondary | ICD-10-CM | POA: Insufficient documentation

## 2015-04-14 ENCOUNTER — Encounter: Payer: Self-pay | Admitting: Family Medicine

## 2015-04-14 ENCOUNTER — Ambulatory Visit (INDEPENDENT_AMBULATORY_CARE_PROVIDER_SITE_OTHER): Payer: Medicare Other | Admitting: Family Medicine

## 2015-04-14 VITALS — BP 134/60 | HR 68 | Temp 98.2°F | Ht 65.0 in | Wt 175.5 lb

## 2015-04-14 DIAGNOSIS — K219 Gastro-esophageal reflux disease without esophagitis: Secondary | ICD-10-CM

## 2015-04-14 DIAGNOSIS — I10 Essential (primary) hypertension: Secondary | ICD-10-CM | POA: Diagnosis not present

## 2015-04-14 DIAGNOSIS — E785 Hyperlipidemia, unspecified: Secondary | ICD-10-CM | POA: Diagnosis not present

## 2015-04-14 DIAGNOSIS — R3915 Urgency of urination: Secondary | ICD-10-CM

## 2015-04-14 DIAGNOSIS — Z0000 Encounter for general adult medical examination without abnormal findings: Secondary | ICD-10-CM

## 2015-04-14 DIAGNOSIS — Z23 Encounter for immunization: Secondary | ICD-10-CM | POA: Diagnosis not present

## 2015-04-14 DIAGNOSIS — Z1159 Encounter for screening for other viral diseases: Secondary | ICD-10-CM

## 2015-04-14 DIAGNOSIS — R300 Dysuria: Secondary | ICD-10-CM

## 2015-04-14 DIAGNOSIS — I6523 Occlusion and stenosis of bilateral carotid arteries: Secondary | ICD-10-CM

## 2015-04-14 DIAGNOSIS — E782 Mixed hyperlipidemia: Secondary | ICD-10-CM

## 2015-04-14 DIAGNOSIS — R739 Hyperglycemia, unspecified: Secondary | ICD-10-CM

## 2015-04-14 DIAGNOSIS — I519 Heart disease, unspecified: Secondary | ICD-10-CM

## 2015-04-14 DIAGNOSIS — R350 Frequency of micturition: Secondary | ICD-10-CM

## 2015-04-14 LAB — CBC
HCT: 43.2 % (ref 39.0–52.0)
Hemoglobin: 14.4 g/dL (ref 13.0–17.0)
MCHC: 33.3 g/dL (ref 30.0–36.0)
MCV: 93.9 fl (ref 78.0–100.0)
Platelets: 237 10*3/uL (ref 150.0–400.0)
RBC: 4.61 Mil/uL (ref 4.22–5.81)
RDW: 13 % (ref 11.5–15.5)
WBC: 8 10*3/uL (ref 4.0–10.5)

## 2015-04-14 LAB — LIPID PANEL
CHOL/HDL RATIO: 2
Cholesterol: 93 mg/dL (ref 0–200)
HDL: 39.5 mg/dL (ref >39.00)
LDL Cholesterol: 34 mg/dL (ref 0–99)
NONHDL: 53.72
TRIGLYCERIDES: 97 mg/dL (ref 0.0–149.0)
VLDL: 19.4 mg/dL (ref 0.0–40.0)

## 2015-04-14 LAB — COMPREHENSIVE METABOLIC PANEL
ALT: 26 U/L (ref 0–53)
AST: 20 U/L (ref 0–37)
Albumin: 4.4 g/dL (ref 3.5–5.2)
Alkaline Phosphatase: 75 U/L (ref 39–117)
BUN: 13 mg/dL (ref 6–23)
CHLORIDE: 103 meq/L (ref 96–112)
CO2: 29 meq/L (ref 19–32)
CREATININE: 0.81 mg/dL (ref 0.40–1.50)
Calcium: 9.2 mg/dL (ref 8.4–10.5)
GFR: 100.19 mL/min (ref >60.00)
Glucose, Bld: 100 mg/dL — ABNORMAL HIGH (ref 70–99)
Potassium: 3.9 mEq/L (ref 3.5–5.1)
SODIUM: 139 meq/L (ref 135–145)
Total Bilirubin: 0.8 mg/dL (ref 0.2–1.2)
Total Protein: 7.6 g/dL (ref 6.0–8.3)

## 2015-04-14 LAB — HEPATITIS C ANTIBODY: HCV AB: NEGATIVE

## 2015-04-14 LAB — HEMOGLOBIN A1C: Hgb A1c MFr Bld: 6.1 % (ref 4.6–6.5)

## 2015-04-14 LAB — TSH: TSH: 1.88 u[IU]/mL (ref 0.35–4.50)

## 2015-04-14 MED ORDER — RANITIDINE HCL 300 MG PO TABS: 300.0000 mg | ORAL_TABLET | Freq: Every day | ORAL | Status: DC

## 2015-04-14 NOTE — Progress Notes (Signed)
Pre visit review using our clinic review tool, if applicable. No additional management support is needed unless otherwise documented below in the visit note. 

## 2015-04-14 NOTE — Patient Instructions (Signed)
NOW probiotic daily  Preventive Care for Adults, Male A healthy lifestyle and preventive care can promote health and wellness. Preventive health guidelines for men include the following key practices:  A routine yearly physical is a good way to check with your health care provider about your health and preventative screening. It is a chance to share any concerns and updates on your health and to receive a thorough exam.  Visit your dentist for a routine exam and preventative care every 6 months. Brush your teeth twice a day and floss once a day. Good oral hygiene prevents tooth decay and gum disease.  The frequency of eye exams is based on your age, health, family medical history, use of contact lenses, and other factors. Follow your health care provider's recommendations for frequency of eye exams.  Eat a healthy diet. Foods such as vegetables, fruits, whole grains, low-fat dairy products, and lean protein foods contain the nutrients you need without too many calories. Decrease your intake of foods high in solid fats, added sugars, and salt. Eat the right amount of calories for you.Get information about a proper diet from your health care provider, if necessary.  Regular physical exercise is one of the most important things you can do for your health. Most adults should get at least 150 minutes of moderate-intensity exercise (any activity that increases your heart rate and causes you to sweat) each week. In addition, most adults need muscle-strengthening exercises on 2 or more days a week.  Maintain a healthy weight. The body mass index (BMI) is a screening tool to identify possible weight problems. It provides an estimate of body fat based on height and weight. Your health care provider can find your BMI and can help you achieve or maintain a healthy weight.For adults 20 years and older:  A BMI below 18.5 is considered underweight.  A BMI of 18.5 to 24.9 is normal.  A BMI of 25 to 29.9 is  considered overweight.  A BMI of 30 and above is considered obese.  Maintain normal blood lipids and cholesterol levels by exercising and minimizing your intake of saturated fat. Eat a balanced diet with plenty of fruit and vegetables. Blood tests for lipids and cholesterol should begin at age 67 and be repeated every 5 years. If your lipid or cholesterol levels are high, you are over 50, or you are at high risk for heart disease, you may need your cholesterol levels checked more frequently.Ongoing high lipid and cholesterol levels should be treated with medicines if diet and exercise are not working.  If you smoke, find out from your health care provider how to quit. If you do not use tobacco, do not start.  Lung cancer screening is recommended for adults aged 55-80 years who are at high risk for developing lung cancer because of a history of smoking. A yearly low-dose CT scan of the lungs is recommended for people who have at least a 30-pack-year history of smoking and are a current smoker or have quit within the past 15 years. A pack year of smoking is smoking an average of 1 pack of cigarettes a day for 1 year (for example: 1 pack a day for 30 years or 2 packs a day for 15 years). Yearly screening should continue until the smoker has stopped smoking for at least 15 years. Yearly screening should be stopped for people who develop a health problem that would prevent them from having lung cancer treatment.  If you choose to drink alcohol,  do not have more than 2 drinks per day. One drink is considered to be 12 ounces (355 mL) of beer, 5 ounces (148 mL) of wine, or 1.5 ounces (44 mL) of liquor.  Avoid use of street drugs. Do not share needles with anyone. Ask for help if you need support or instructions about stopping the use of drugs.  High blood pressure causes heart disease and increases the risk of stroke. Your blood pressure should be checked at least every 1-2 years. Ongoing high blood pressure  should be treated with medicines, if weight loss and exercise are not effective.  If you are 39-17 years old, ask your health care provider if you should take aspirin to prevent heart disease.  Diabetes screening is done by taking a blood sample to check your blood glucose level after you have not eaten for a certain period of time (fasting). If you are not overweight and you do not have risk factors for diabetes, you should be screened once every 3 years starting at age 10. If you are overweight or obese and you are 31-56 years of age, you should be screened for diabetes every year as part of your cardiovascular risk assessment.  Colorectal cancer can be detected and often prevented. Most routine colorectal cancer screening begins at the age of 29 and continues through age 93. However, your health care provider may recommend screening at an earlier age if you have risk factors for colon cancer. On a yearly basis, your health care provider may provide home test kits to check for hidden blood in the stool. Use of a small camera at the end of a tube to directly examine the colon (sigmoidoscopy or colonoscopy) can detect the earliest forms of colorectal cancer. Talk to your health care provider about this at age 85, when routine screening begins. Direct exam of the colon should be repeated every 5-10 years through age 23, unless early forms of precancerous polyps or small growths are found.  People who are at an increased risk for hepatitis B should be screened for this virus. You are considered at high risk for hepatitis B if:  You were born in a country where hepatitis B occurs often. Talk with your health care provider about which countries are considered high risk.  Your parents were born in a high-risk country and you have not received a shot to protect against hepatitis B (hepatitis B vaccine).  You have HIV or AIDS.  You use needles to inject street drugs.  You live with, or have sex with,  someone who has hepatitis B.  You are a man who has sex with other men (MSM).  You get hemodialysis treatment.  You take certain medicines for conditions such as cancer, organ transplantation, and autoimmune conditions.  Hepatitis C blood testing is recommended for all people born from [birth year] through 1965 and any individual with known risks for hepatitis C.  Practice safe sex. Use condoms and avoid high-risk sexual practices to reduce the spread of sexually transmitted infections (STIs). STIs include gonorrhea, chlamydia, syphilis, trichomonas, herpes, HPV, and human immunodeficiency virus (HIV). Herpes, HIV, and HPV are viral illnesses that have no cure. They can result in disability, cancer, and death.  If you are a man who has sex with other men, you should be screened at least once per year for:  HIV.  Urethral, rectal, and pharyngeal infection of gonorrhea, chlamydia, or both.  If you are at risk of being infected with HIV, it is recommended  that you take a prescription medicine daily to prevent HIV infection. This is called preexposure prophylaxis (PrEP). You are considered at risk if:  You are a man who has sex with other men (MSM) and have other risk factors.  You are a heterosexual man, are sexually active, and are at increased risk for HIV infection.  You take drugs by injection.  You are sexually active with a partner who has HIV.  Talk with your health care provider about whether you are at high risk of being infected with HIV. If you choose to begin PrEP, you should first be tested for HIV. You should then be tested every 3 months for as long as you are taking PrEP.  A one-time screening for abdominal aortic aneurysm (AAA) and surgical repair of large AAAs by ultrasound are recommended for men ages 25 to 55 years who are current or former smokers.  Healthy men should no longer receive prostate-specific antigen (PSA) blood tests as part of routine cancer screening. Talk with  your health care provider about prostate cancer screening.  Testicular cancer screening is not recommended for adult males who have no symptoms. Screening includes self-exam, a health care provider exam, and other screening tests. Consult with your health care provider about any symptoms you have or any concerns you have about testicular cancer.  Use sunscreen. Apply sunscreen liberally and repeatedly throughout the day. You should seek shade when your shadow is shorter than you. Protect yourself by wearing long sleeves, pants, a wide-brimmed hat, and sunglasses year round, whenever you are outdoors.  Once a month, do a whole-body skin exam, using a mirror to look at the skin on your back. Tell your health care provider about new moles, moles that have irregular borders, moles that are larger than a pencil eraser, or moles that have changed in shape or color.  Stay current with required vaccines (immunizations).  Influenza vaccine. All adults should be immunized every year.  Tetanus, diphtheria, and acellular pertussis (Td, Tdap) vaccine. An adult who has not previously received Tdap or who does not know his vaccine status should receive 1 dose of Tdap. This initial dose should be followed by tetanus and diphtheria toxoids (Td) booster doses every 10 years. Adults with an unknown or incomplete history of completing a 3-dose immunization series with Td-containing vaccines should begin or complete a primary immunization series including a Tdap dose. Adults should receive a Td booster every 10 years.  Varicella vaccine. An adult without evidence of immunity to varicella should receive 2 doses or a second dose if he has previously received 1 dose.  Human papillomavirus (HPV) vaccine. Males aged 11-21 years who have not received the vaccine previously should receive the 3-dose series. Males aged 22-26 years may be immunized. Immunization is recommended through the age of 24 years for any male who has sex  with males and did not get any or all doses earlier. Immunization is recommended for any person with an immunocompromised condition through the age of 29 years if he did not get any or all doses earlier. During the 3-dose series, the second dose should be obtained 4-8 weeks after the first dose. The third dose should be obtained 24 weeks after the first dose and 16 weeks after the second dose.  Zoster vaccine. One dose is recommended for adults aged 69 years or older unless certain conditions are present.  Measles, mumps, and rubella (MMR) vaccine. Adults born before 60 generally are considered immune to measles and mumps.  Adults born in [birth year] or later should have 1 or more doses of MMR vaccine unless there is a contraindication to the vaccine or there is laboratory evidence of immunity to each of the three diseases. A routine second dose of MMR vaccine should be obtained at least 28 days after the first dose for students attending postsecondary schools, health care workers, or international travelers. People who received inactivated measles vaccine or an unknown type of measles vaccine during 1963-1967 should receive 2 doses of MMR vaccine. People who received inactivated mumps vaccine or an unknown type of mumps vaccine before 1979 and are at high risk for mumps infection should consider immunization with 2 doses of MMR vaccine. Unvaccinated health care workers born before [birth year] who lack laboratory evidence of measles, mumps, or rubella immunity or laboratory confirmation of disease should consider measles and mumps immunization with 2 doses of MMR vaccine or rubella immunization with 1 dose of MMR vaccine.  Pneumococcal 13-valent conjugate (PCV13) vaccine. When indicated, a person who is uncertain of his immunization history and has no record of immunization should receive the PCV13 vaccine. All adults 57 years of age and older should receive this vaccine. An adult aged 56 years or older who has certain  medical conditions and has not been previously immunized should receive 1 dose of PCV13 vaccine. This PCV13 should be followed with a dose of pneumococcal polysaccharide (PPSV23) vaccine. Adults who are at high risk for pneumococcal disease should obtain the PPSV23 vaccine at least 8 weeks after the dose of PCV13 vaccine. Adults older than 70 years of age who have normal immune system function should obtain the PPSV23 vaccine dose at least 1 year after the dose of PCV13 vaccine.  Pneumococcal polysaccharide (PPSV23) vaccine. When PCV13 is also indicated, PCV13 should be obtained first. All adults aged 9 years and older should be immunized. An adult younger than age 28 years who has certain medical conditions should be immunized. Any person who resides in a nursing home or long-term care facility should be immunized. An adult smoker should be immunized. People with an immunocompromised condition and certain other conditions should receive both PCV13 and PPSV23 vaccines. People with human immunodeficiency virus (HIV) infection should be immunized as soon as possible after diagnosis. Immunization during chemotherapy or radiation therapy should be avoided. Routine use of PPSV23 vaccine is not recommended for American Indians, [place] Natives, or people younger than 65 years unless there are medical conditions that require PPSV23 vaccine. When indicated, people who have unknown immunization and have no record of immunization should receive PPSV23 vaccine. One-time revaccination 5 years after the first dose of PPSV23 is recommended for people aged 19-64 years who have chronic kidney failure, nephrotic syndrome, asplenia, or immunocompromised conditions. People who received 1-2 doses of PPSV23 before age 42 years should receive another dose of PPSV23 vaccine at age 68 years or later if at least 5 years have passed since the previous dose. Doses of PPSV23 are not needed for people immunized with PPSV23 at or after age 64  years.  Meningococcal vaccine. Adults with asplenia or persistent complement component deficiencies should receive 2 doses of quadrivalent meningococcal conjugate (MenACWY-D) vaccine. The doses should be obtained at least 2 months apart. Microbiologists working with certain meningococcal bacteria, [employer] recruits, people at risk during an outbreak, and people who travel to or live in countries with a high rate of meningitis should be immunized. A first-year college student up through age 52 years who is living in a residence  dose if he did not receive a dose on or after his 16th birthday. Adults who have certain high-risk conditions should receive one or more doses of vaccine.  Hepatitis A vaccine. Adults who wish to be protected from this disease, have chronic liver disease, work with hepatitis A-infected animals, work in hepatitis A research labs, or travel to or work in countries with a high rate of hepatitis A should be immunized. Adults who were previously unvaccinated and who anticipate close contact with an international adoptee during the first 60 days after arrival in the [place] from a country with a high rate of hepatitis A should be immunized.  Hepatitis B vaccine. Adults should be immunized if they wish to be protected from this disease, are under age 59 years and have diabetes, have chronic liver disease, have had more than one sex partner in the past 6 months, may be exposed to blood or other infectious body fluids, are household contacts or sex partners of hepatitis B positive people, are clients or workers in certain care facilities, or travel to or work in countries with a high rate of hepatitis B.  Haemophilus influenzae type b (Hib) vaccine. A previously unvaccinated person with asplenia or sickle cell disease or having a scheduled splenectomy should receive 1 dose of Hib vaccine. Regardless of previous immunization, a recipient of a hematopoietic stem cell  transplant should receive a 3-dose series 6-12 months after his successful transplant. Hib vaccine is not recommended for adults with HIV infection. Preventive Service / Frequency Ages 19 to 39  Blood pressure check.** / Every 3-5 years.  Lipid and cholesterol check.** / Every 5 years beginning at age 20.  Hepatitis C blood test.** / For any individual with known risks for hepatitis C.  Skin self-exam. / Monthly.  Influenza vaccine. / Every year.  Tetanus, diphtheria, and acellular pertussis (Tdap, Td) vaccine.** / Consult your health care provider. 1 dose of Td every 10 years.  Varicella vaccine.** / Consult your health care provider.  HPV vaccine. / 3 doses over 6 months, if 26 or younger.  Measles, mumps, rubella (MMR) vaccine.** / You need at least 1 dose of MMR if you were born in 1957 or later. You may also need a second dose.  Pneumococcal 13-valent conjugate (PCV13) vaccine.** / Consult your health care provider.  Pneumococcal polysaccharide (PPSV23) vaccine.** / 1 to 2 doses if you smoke cigarettes or if you have certain conditions.  Meningococcal vaccine.** / 1 dose if you are age 19 to 21 years and a first-year college student living in a residence hall, or have one of several medical conditions. You may also need additional booster doses.  Hepatitis A vaccine.** / Consult your health care provider.  Hepatitis B vaccine.** / Consult your health care provider.  Haemophilus influenzae type b (Hib) vaccine.** / Consult your health care provider. Ages 40 to 64  Blood pressure check.** / Every year.  Lipid and cholesterol check.** / Every 5 years beginning at age 20.  Lung cancer screening. / Every year if you are aged 55-80 years and have a 30-pack-year history of smoking and currently smoke or have quit within the past 15 years. Yearly screening is stopped once you have quit smoking for at least 15 years or develop a health problem that would prevent you from having  lung cancer treatment.  Fecal occult blood test (FOBT) of stool. / Every year beginning at age 50 and continuing until age 75. You may not have to do   not have to do this test if you get a colonoscopy every 10 years.  Flexible sigmoidoscopy** or colonoscopy.** / Every 5 years for a flexible sigmoidoscopy or every 10 years for a colonoscopy beginning at age 39 and continuing until age 66.  Hepatitis C blood test.** / For all people born from [birth year] through 1965 and any individual with known risks for hepatitis C.  Skin self-exam. / Monthly.  Influenza vaccine. / Every year.  Tetanus, diphtheria, and acellular pertussis (Tdap/Td) vaccine.** / Consult your health care provider. 1 dose of Td every 10 years.  Varicella vaccine.** / Consult your health care provider.  Zoster vaccine.** / 1 dose for adults aged 82 years or older.  Measles, mumps, rubella (MMR) vaccine.** / You need at least 1 dose of MMR if you were born in 1957 or later. You may also need a second dose.  Pneumococcal 13-valent conjugate (PCV13) vaccine.** / Consult your health care provider.  Pneumococcal polysaccharide (PPSV23) vaccine.** / 1 to 2 doses if you smoke cigarettes or if you have certain conditions.  Meningococcal vaccine.** / Consult your health care provider.  Hepatitis A vaccine.** / Consult your health care provider.  Hepatitis B vaccine.** / Consult your health care provider.  Haemophilus influenzae type b (Hib) vaccine.** / Consult your health care provider. Ages 66 and over  Blood pressure check.** / Every year.  Lipid and cholesterol check.**/ Every 5 years beginning at age 59.  Lung cancer screening. / Every year if you are aged 43-80 years and have a 30-pack-year history of smoking and currently smoke or have quit within the past 15 years. Yearly screening is stopped once you have quit smoking for at least 15 years or develop a health problem that would prevent you from having lung cancer treatment.  Fecal  occult blood test (FOBT) of stool. / Every year beginning at age 42 and continuing until age 28. You may not have to do this test if you get a colonoscopy every 10 years.  Flexible sigmoidoscopy** or colonoscopy.** / Every 5 years for a flexible sigmoidoscopy or every 10 years for a colonoscopy beginning at age 67 and continuing until age 51.  Hepatitis C blood test.** / For all people born from [birth year] through 1965 and any individual with known risks for hepatitis C.  Abdominal aortic aneurysm (AAA) screening.** / A one-time screening for ages 43 to 35 years who are current or former smokers.  Skin self-exam. / Monthly.  Influenza vaccine. / Every year.  Tetanus, diphtheria, and acellular pertussis (Tdap/Td) vaccine.** / 1 dose of Td every 10 years.  Varicella vaccine.** / Consult your health care provider.  Zoster vaccine.** / 1 dose for adults aged 42 years or older.  Pneumococcal 13-valent conjugate (PCV13) vaccine.** / 1 dose for all adults aged 28 years and older.  Pneumococcal polysaccharide (PPSV23) vaccine.** / 1 dose for all adults aged 70 years and older.  Meningococcal vaccine.** / Consult your health care provider.  Hepatitis A vaccine.** / Consult your health care provider.  Hepatitis B vaccine.** / Consult your health care provider.  Haemophilus influenzae type b (Hib) vaccine.** / Consult your health care provider. **Family history and personal history of risk and conditions may change your health care provider's recommendations.   This information is not intended to replace advice given to you by your health care provider. Make sure you discuss any questions you have with your health care provider.   Document Released: 04/23/2001 Document Revised: 03/18/2014 Document Reviewed: 07/23/2010 [employer]vier  Interactive Patient Education [employer].

## 2015-04-14 NOTE — Progress Notes (Signed)
Subjective:    Patient ID: Anthony [name], male    DOB: 10/09/1945, 70 y.o.   MRN: [id number]  Chief Complaint  Patient presents with  . Medicare Wellness    HPI Patient is in today for wellness exam and follow up on numerous concerns. No recent illness, is irritated by skin tags on neck. Is doing well at home, no difficulties with ADLs at home. Denies CP/palp/SOB/HA/congestion/fevers/GI or GU c/o. Taking meds as prescribed  Past Medical History  Diagnosis Date  . GERD (gastroesophageal reflux disease)   . Hyperlipidemia   . Hypertension     8-10 years  . Cataract   . CAD (coronary artery disease) March 2011    with a non-ST elevation myocardial infarction. cardiac catherization whic revealed-left main was normal. The LAD had proximal calcification. There was long proximal 30% stenosis. Circumflex inthe AV groove had  proximal  60% stenosis at a mid obtuse marginal. This first large mid obtuse marginal had ostial 70% stenosis. The right coronary artery had a proximal long 50% stenosis.   . Kidney stone   . External hemorrhoids     large external  . Kidney stones 07/20/2012  . Personal history of colonic polyps 04/05/2013    Past Surgical History  Procedure Laterality Date  . Cataract extraction Bilateral 2013  . Coronary angioplasty with stent placement  2011  . Colonoscopy  2009    polyp-[facility], 2015- x4 polyps removed  . Nephrolithotomy Left 09/29/2013    Procedure: NEPHROLITHOTOMY PERCUTANEOUS WITH ACCESS;  Surgeon: Alexis Frock, MD;  Location: [facility];  Service: Urology;  Laterality: Left;  . Cystoscopy w/ retrogrades Bilateral 09/29/2013    Procedure: CYSTOSCOPY WITH RETROGRADE PYELOGRAM;  Surgeon: Alexis Frock, MD;  Location: [facility];  Service: Urology;  Laterality: Bilateral;  . Ureteroscopy N/A 09/29/2013    Procedure: URETEROSCOPY;  Surgeon: Alexis Frock, MD;  Location: [facility];  Service: Urology;  Laterality: N/A;    Family History  Problem Relation Age of Onset    . Coronary artery disease Father     in his 15's  . Hyperlipidemia Father   . Hypertension Father   . Hearing loss Father   . Heart disease Mother     deceased of heart disease  . Hypertension Mother   . Hyperlipidemia Mother   . Other Neg Hx     no lung cancer, colon cancer, or prostate  . Colon cancer Neg Hx   . Multiple sclerosis Son   . Heart disease Maternal Grandmother   . Heart disease Maternal Grandfather   . Heart disease Paternal Grandmother   . Heart disease Paternal Grandfather   . GER disease Sister     Social History   Social History  . Marital Status: Married    Spouse Name: N/A  . Number of Children: N/A  . Years of Education: N/A   Occupational History  . Not on file.   Social History Main Topics  . Smoking status: Former Smoker    Quit date: 03/12/2007  . Smokeless tobacco: Never Used     Comment: 40 pack year history  . Alcohol Use: No  . Drug Use: No  . 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 Not on file   Social History Narrative   married -48 yrs  [name] Ashley)   grew up in [place]   He has one son -  two grandaugthers     He is retired.  He had     a 40 pack-year smoking history but has discontinued.     prev occuptation -  [employer]   Alcohol use-no   Smoking Status:  quit   Packs/Day:  0.5   Caffeine use/day:  2 beverages daily   Does Patient Exercise:  yes             Outpatient Prescriptions Prior to Visit  Medication Sig Dispense Refill  . amLODipine (NORVASC) 10 MG tablet Take 1 tablet by mouth  every morning 90 tablet 0  . aspirin 81 MG tablet Take 81 mg by mouth daily.     Marland Kitchen losartan (COZAAR) 100 MG tablet Take 1 tablet by mouth  every morning 90 tablet 0  . metoprolol tartrate (LOPRESSOR) 25 MG tablet Take one-half tablet by  mouth twice a day 90 tablet 3  . Multiple Vitamins-Minerals (ICAPS PO) Take by mouth daily.     . potassium citrate (UROCIT-K) 10 MEQ (1080 MG) SR tablet TK 1 T PO TID  3  . rosuvastatin (CRESTOR) 40 MG tablet Take 1 tablet by mouth  daily 90 tablet 3   No facility-administered medications prior to visit.    No Known Allergies  Review of Systems  Constitutional: Negative for fever, chills and malaise/fatigue.  HENT: Negative for congestion and hearing loss.   Eyes: Negative for discharge.  Respiratory: Negative for cough, sputum production and shortness of breath.   Cardiovascular: Negative for chest pain, palpitations and leg swelling.  Gastrointestinal: Positive for heartburn. Negative for nausea, vomiting, abdominal pain, diarrhea, constipation and blood in stool.  Genitourinary: Positive for dysuria and frequency. Negative for urgency and hematuria.  Musculoskeletal: Negative for myalgias, back pain and falls.  Skin: Negative for rash.  Neurological: Negative for dizziness, sensory change, loss of consciousness, weakness and headaches.  Endo/Heme/Allergies: Negative for environmental allergies. Does not bruise/bleed easily.  Psychiatric/Behavioral: Negative for depression and suicidal ideas. The patient is not nervous/anxious and does not have insomnia.        Objective:    Physical Exam  Constitutional: He is oriented to person, place, and time. He appears well-developed and well-nourished. No distress.  HENT:  Head: Normocephalic and atraumatic.  Eyes: Conjunctivae are normal.  Neck: Normal range of motion. Neck supple. No thyromegaly present.  Cardiovascular: Normal rate, regular rhythm and normal heart sounds.   No murmur heard. Pulmonary/Chest: Effort normal and breath sounds normal. No respiratory distress. He has no wheezes.  Abdominal: Soft. Bowel sounds are normal. He exhibits no mass. There is no tenderness.  Musculoskeletal: Normal range of motion. He exhibits no edema.  Lymphadenopathy:    He has no cervical adenopathy.  Neurological: He is alert and  oriented to person, place, and time.  Skin: Skin is warm and dry.  Psychiatric: He has a normal mood and affect. His behavior is normal.    BP 134/60 mmHg  Pulse 68  Temp(Src) 98.2 F (36.8 C) (Oral)  Ht 5\' 5"  (1.651 m)  Wt 175 lb 8 oz (79.606 kg)  BMI 29.20 kg/m2  SpO2 99% Wt Readings from Last 3 Encounters:  04/14/15 175 lb 8 oz (79.606 kg)  11/30/14 177 lb (80.287 kg)  10/04/14 177 lb 4 oz (80.4 kg)     Lab Results  Component Value Date   WBC 8.0 04/14/2015   HGB 14.4 04/14/2015   HCT 43.2 04/14/2015   PLT 237.0 04/14/2015   GLUCOSE  100* 04/14/2015   CHOL 93 04/14/2015   TRIG 97.0 04/14/2015   HDL 39.50 04/14/2015   LDLCALC 34 04/14/2015   ALT 26 04/14/2015   AST 20 04/14/2015   NA 139 04/14/2015   K 3.9 04/14/2015   CL 103 04/14/2015   CREATININE 0.81 04/14/2015   BUN 13 04/14/2015   CO2 29 04/14/2015   TSH 1.88 04/14/2015   PSA 3.32 03/24/2013   INR 1.05 05/25/2009   HGBA1C 6.1 04/14/2015    Lab Results  Component Value Date   TSH 1.88 04/14/2015   Lab Results  Component Value Date   WBC 8.0 04/14/2015   HGB 14.4 04/14/2015   HCT 43.2 04/14/2015   MCV 93.9 04/14/2015   PLT 237.0 04/14/2015   Lab Results  Component Value Date   NA 139 04/14/2015   K 3.9 04/14/2015   CO2 29 04/14/2015   GLUCOSE 100* 04/14/2015   BUN 13 04/14/2015   CREATININE 0.81 04/14/2015   BILITOT 0.8 04/14/2015   ALKPHOS 75 04/14/2015   AST 20 04/14/2015   ALT 26 04/14/2015   PROT 7.6 04/14/2015   ALBUMIN 4.4 04/14/2015   CALCIUM 9.2 04/14/2015   ANIONGAP 12 09/30/2013   GFR 100.19 04/14/2015   Lab Results  Component Value Date   CHOL 93 04/14/2015   Lab Results  Component Value Date   HDL 39.50 04/14/2015   Lab Results  Component Value Date   LDLCALC 34 04/14/2015   Lab Results  Component Value Date   TRIG 97.0 04/14/2015   Lab Results  Component Value Date   CHOLHDL 2 04/14/2015   Lab Results  Component Value Date   HGBA1C 6.1 04/14/2015        Assessment & Plan:   Problem List Items Addressed This Visit    Encounter for Medicare annual wellness exam    Patient denies any difficulties at home. No trouble with ADLs, depression or falls. See EMR for functional status screen and depression screen. No recent changes to vision or hearing. Is UTD with immunizations. Is UTD with screening. Discussed Advanced Directives. Encouraged heart healthy diet, exercise as tolerated and adequate sleep. See patient's problem list for health risk factors to monitor. See AVS for preventative healthcare recommendation schedule. Labs reviewed Follows with dermatology, Dr Maisie Fus with cardiology, Dr Rance Muir with gastroenterology, Dr Hilarie Fredrickson Immunizations updated today      Essential hypertension    Well controlled, no changes to meds. Encouraged heart healthy diet such as the DASH diet and exercise as tolerated.       Relevant Orders   CBC (Completed)   Lipid panel (Completed)   TSH (Completed)   Comprehensive metabolic panel (Completed)   GERD    Avoid offending foods, start probiotics. Do not eat large meals in late evening, continue Ranitidine as needed      Relevant Medications   ranitidine (ZANTAC) 300 MG tablet   Hyperglycemia    hgba1c acceptable, minimize simple carbs. Increase exercise as tolerated.       Relevant Orders   Hemoglobin A1c (Completed)   Hyperlipidemia, mixed    Tolerating statin, encouraged heart healthy diet, avoid trans fats, minimize simple carbs and saturated fats. Increase exercise as tolerated       Other Visit Diagnoses    Need for hepatitis C screening test    -  Primary    Relevant Orders    Hepatitis C antibody (Completed)    Heart disease  Relevant Orders    CBC (Completed)    Lipid panel (Completed)    TSH (Completed)    Comprehensive metabolic panel (Completed)    Hyperlipemia        Relevant Orders    Lipid panel (Completed)    Preventative health care         Need for vaccination with 13-polyvalent pneumococcal conjugate vaccine        Relevant Orders    Pneumococcal conjugate vaccine 13-valent IM (Completed)    Urinary urgency        Urinary frequency        Medicare annual wellness visit, subsequent        Dysuria           I am having Mr. Forrister start on ranitidine. I am also having him maintain his aspirin, Multiple Vitamins-Minerals (ICAPS PO), potassium citrate, rosuvastatin, metoprolol tartrate, losartan, and amLODipine.  Meds ordered this encounter  Medications  . ranitidine (ZANTAC) 300 MG tablet    Sig: Take 1 tablet (300 mg total) by mouth at bedtime.    Dispense:  30 tablet    Refill:  [place]ang [place], MD

## 2015-04-14 NOTE — Assessment & Plan Note (Signed)
Well controlled, no changes to meds. Encouraged heart healthy diet such as the DASH diet and exercise as tolerated.  °

## 2015-04-23 DIAGNOSIS — R300 Dysuria: Secondary | ICD-10-CM | POA: Insufficient documentation

## 2015-04-23 NOTE — Assessment & Plan Note (Signed)
Mild, intermittent. Likely dehydration. Increase hydration and report worsening symptoms

## 2015-04-23 NOTE — Assessment & Plan Note (Signed)
Tolerating statin, encouraged heart healthy diet, avoid trans fats, minimize simple carbs and saturated fats. Increase exercise as tolerated 

## 2015-04-23 NOTE — Assessment & Plan Note (Addendum)
Patient denies any difficulties at home. No trouble with ADLs, depression or falls. See EMR for functional status screen and depression screen. No recent changes to vision or hearing. Is UTD with immunizations. Is UTD with screening. Discussed Advanced Directives. Encouraged heart healthy diet, exercise as tolerated and adequate sleep. See patient's problem list for health risk factors to monitor. See AVS for preventative healthcare recommendation schedule. Labs reviewed Follows with dermatology, Dr Maisie Fus with cardiology, Dr Rance Muir with gastroenterology, Dr Hilarie Fredrickson Immunizations updated today

## 2015-04-23 NOTE — Assessment & Plan Note (Addendum)
Avoid offending foods, start probiotics. Do not eat large meals in late evening, continue Ranitidine as needed

## 2015-04-23 NOTE — Assessment & Plan Note (Signed)
hgba1c acceptable, minimize simple carbs. Increase exercise as tolerated.  

## 2015-05-23 DIAGNOSIS — R8299 Other abnormal findings in urine: Secondary | ICD-10-CM | POA: Diagnosis not present

## 2015-05-30 DIAGNOSIS — N200 Calculus of kidney: Secondary | ICD-10-CM | POA: Diagnosis not present

## 2015-05-30 DIAGNOSIS — R8299 Other abnormal findings in urine: Secondary | ICD-10-CM | POA: Diagnosis not present

## 2015-05-30 DIAGNOSIS — Z0000 Encounter for general adult medical examination without abnormal findings: Secondary | ICD-10-CM | POA: Diagnosis not present

## 2015-05-31 ENCOUNTER — Other Ambulatory Visit: Payer: Self-pay | Admitting: Urology

## 2015-06-02 ENCOUNTER — Other Ambulatory Visit: Payer: Self-pay | Admitting: Family Medicine

## 2015-06-09 ENCOUNTER — Encounter (HOSPITAL_BASED_OUTPATIENT_CLINIC_OR_DEPARTMENT_OTHER): Payer: Self-pay | Admitting: *Deleted

## 2015-06-09 NOTE — Progress Notes (Signed)
To [facility] at 1230-Istat 8 on arrival- Ekg office visit w/chart.Instructed Npo solids after Mn-clear liquids only till 0630,then Npo-will take lopressor,norvasc,cozaar in am.

## 2015-06-14 ENCOUNTER — Encounter (HOSPITAL_BASED_OUTPATIENT_CLINIC_OR_DEPARTMENT_OTHER): Payer: Self-pay

## 2015-06-14 ENCOUNTER — Ambulatory Visit (HOSPITAL_BASED_OUTPATIENT_CLINIC_OR_DEPARTMENT_OTHER)
Admission: RE | Admit: 2015-06-14 | Discharge: 2015-06-14 | Disposition: A | Discharge status: Home / Self Care | Payer: Medicare Other | Source: Ambulatory Visit | Attending: Urology | Admitting: Urology

## 2015-06-14 ENCOUNTER — Ambulatory Visit (HOSPITAL_BASED_OUTPATIENT_CLINIC_OR_DEPARTMENT_OTHER): Payer: Medicare Other | Admitting: Anesthesiology

## 2015-06-14 ENCOUNTER — Encounter (HOSPITAL_BASED_OUTPATIENT_CLINIC_OR_DEPARTMENT_OTHER)
Admission: RE | Disposition: A | Discharge status: Home / Self Care | Payer: Self-pay | Source: Ambulatory Visit | Attending: Urology

## 2015-06-14 DIAGNOSIS — Z87891 Personal history of nicotine dependence: Secondary | ICD-10-CM | POA: Insufficient documentation

## 2015-06-14 DIAGNOSIS — N200 Calculus of kidney: Secondary | ICD-10-CM | POA: Diagnosis not present

## 2015-06-14 DIAGNOSIS — I2510 Atherosclerotic heart disease of native coronary artery without angina pectoris: Secondary | ICD-10-CM | POA: Diagnosis not present

## 2015-06-14 DIAGNOSIS — N189 Chronic kidney disease, unspecified: Secondary | ICD-10-CM | POA: Diagnosis not present

## 2015-06-14 DIAGNOSIS — Z87442 Personal history of urinary calculi: Secondary | ICD-10-CM | POA: Diagnosis not present

## 2015-06-14 DIAGNOSIS — N210 Calculus in bladder: Secondary | ICD-10-CM | POA: Insufficient documentation

## 2015-06-14 DIAGNOSIS — I10 Essential (primary) hypertension: Secondary | ICD-10-CM | POA: Diagnosis not present

## 2015-06-14 DIAGNOSIS — I129 Hypertensive chronic kidney disease with stage 1 through stage 4 chronic kidney disease, or unspecified chronic kidney disease: Secondary | ICD-10-CM | POA: Diagnosis not present

## 2015-06-14 DIAGNOSIS — K219 Gastro-esophageal reflux disease without esophagitis: Secondary | ICD-10-CM | POA: Diagnosis not present

## 2015-06-14 DIAGNOSIS — I252 Old myocardial infarction: Secondary | ICD-10-CM | POA: Diagnosis not present

## 2015-06-14 DIAGNOSIS — I739 Peripheral vascular disease, unspecified: Secondary | ICD-10-CM | POA: Diagnosis not present

## 2015-06-14 DIAGNOSIS — N201 Calculus of ureter: Secondary | ICD-10-CM | POA: Diagnosis present

## 2015-06-14 DIAGNOSIS — Z955 Presence of coronary angioplasty implant and graft: Secondary | ICD-10-CM | POA: Insufficient documentation

## 2015-06-14 HISTORY — PX: CYSTOSCOPY WITH RETROGRADE PYELOGRAM, URETEROSCOPY AND STENT PLACEMENT: SHX5789

## 2015-06-14 HISTORY — PX: HOLMIUM LASER APPLICATION: SHX5852

## 2015-06-14 LAB — POCT I-STAT, CHEM 8
BUN: 12 mg/dL (ref 6–20)
Calcium, Ion: 1.14 mmol/L (ref 1.13–1.30)
Chloride: 104 mmol/L (ref 101–111)
Creatinine, Ser: 0.8 mg/dL (ref 0.61–1.24)
Glucose, Bld: 96 mg/dL (ref 65–99)
HCT: 46 % (ref 39.0–52.0)
Hemoglobin: 15.6 g/dL (ref 13.0–17.0)
Potassium: 3.6 mmol/L (ref 3.5–5.1)
Sodium: 142 mmol/L (ref 135–145)
TCO2: 23 mmol/L (ref 0–100)

## 2015-06-14 SURGERY — CYSTOURETEROSCOPY, WITH RETROGRADE PYELOGRAM AND STENT INSERTION
Anesthesia: General | Site: Bladder

## 2015-06-14 MED ORDER — IOPAMIDOL (ISOVUE-370) INJECTION 76%
INTRAVENOUS | Status: DC | PRN
  Administered 2015-06-14: 18 mL

## 2015-06-14 MED ORDER — STERILE WATER FOR IRRIGATION IR SOLN
Status: DC | PRN
  Administered 2015-06-14: 500 mL

## 2015-06-14 MED ORDER — LACTATED RINGERS IV SOLN
INTRAVENOUS | Status: DC
  Administered 2015-06-14: 10:00:00 via INTRAVENOUS
  Filled 2015-06-14: qty 1000

## 2015-06-14 MED ORDER — DEXAMETHASONE SODIUM PHOSPHATE 10 MG/ML IJ SOLN
INTRAMUSCULAR | Status: AC
  Filled 2015-06-14: qty 1

## 2015-06-14 MED ORDER — GENTAMICIN SULFATE 40 MG/ML IJ SOLN
5.0000 mg/kg | INTRAVENOUS | Status: AC
  Administered 2015-06-14: 390 mg via INTRAVENOUS
  Filled 2015-06-14: qty 9.75

## 2015-06-14 MED ORDER — HYDROMORPHONE HCL 1 MG/ML IJ SOLN
0.5000 mg | INTRAMUSCULAR | Status: DC | PRN
  Filled 2015-06-14: qty 1

## 2015-06-14 MED ORDER — PHENYLEPHRINE HCL 10 MG/ML IJ SOLN
INTRAMUSCULAR | Status: DC | PRN
  Administered 2015-06-14 (×4): 120 ug via INTRAVENOUS
  Administered 2015-06-14: 80 ug via INTRAVENOUS

## 2015-06-14 MED ORDER — LIDOCAINE HCL (CARDIAC) 20 MG/ML IV SOLN
INTRAVENOUS | Status: AC
  Filled 2015-06-14: qty 5

## 2015-06-14 MED ORDER — ONDANSETRON HCL 4 MG/2ML IJ SOLN
INTRAMUSCULAR | Status: DC | PRN
  Administered 2015-06-14: 4 mg via INTRAVENOUS

## 2015-06-14 MED ORDER — FENTANYL CITRATE (PF) 100 MCG/2ML IJ SOLN
INTRAMUSCULAR | Status: AC
Start: 2015-06-14 — End: 2015-06-14
  Filled 2015-06-14: qty 4

## 2015-06-14 MED ORDER — FENTANYL CITRATE (PF) 100 MCG/2ML IJ SOLN
INTRAMUSCULAR | Status: AC
  Filled 2015-06-14: qty 2

## 2015-06-14 MED ORDER — LIDOCAINE HCL (CARDIAC) 20 MG/ML IV SOLN
INTRAVENOUS | Status: AC
  Filled 2015-06-14: qty 20

## 2015-06-14 MED ORDER — ONDANSETRON HCL 4 MG/2ML IJ SOLN
4.0000 mg | Freq: Once | INTRAMUSCULAR | Status: DC | PRN
  Filled 2015-06-14: qty 2

## 2015-06-14 MED ORDER — TRAMADOL HCL 50 MG PO TABS: 50.0000 mg | ORAL_TABLET | Freq: Four times a day (QID) | ORAL | Status: DC | PRN

## 2015-06-14 MED ORDER — GENTAMICIN IN SALINE 1.6-0.9 MG/ML-% IV SOLN
80.0000 mg | INTRAVENOUS | Status: DC
  Filled 2015-06-14: qty 50

## 2015-06-14 MED ORDER — MIDAZOLAM HCL 2 MG/2ML IJ SOLN
INTRAMUSCULAR | Status: AC
  Filled 2015-06-14: qty 2

## 2015-06-14 MED ORDER — LIDOCAINE HCL (CARDIAC) 20 MG/ML IV SOLN
INTRAVENOUS | Status: DC | PRN
  Administered 2015-06-14: 80 mg via INTRAVENOUS

## 2015-06-14 MED ORDER — SENNOSIDES-DOCUSATE SODIUM 8.6-50 MG PO TABS: 1.0000 | ORAL_TABLET | Freq: Two times a day (BID) | ORAL | Status: DC

## 2015-06-14 MED ORDER — SODIUM CHLORIDE 0.9 % IR SOLN
Status: DC | PRN
  Administered 2015-06-14: 3000 mL

## 2015-06-14 MED ORDER — PROPOFOL 10 MG/ML IV BOLUS
INTRAVENOUS | Status: AC
  Filled 2015-06-14: qty 20

## 2015-06-14 MED ORDER — PHENYLEPHRINE 40 MCG/ML (10ML) SYRINGE FOR IV PUSH (FOR BLOOD PRESSURE SUPPORT)
PREFILLED_SYRINGE | INTRAVENOUS | Status: AC
  Filled 2015-06-14: qty 10

## 2015-06-14 MED ORDER — FENTANYL CITRATE (PF) 100 MCG/2ML IJ SOLN
INTRAMUSCULAR | Status: DC | PRN
  Administered 2015-06-14: 50 ug via INTRAVENOUS

## 2015-06-14 MED ORDER — ONDANSETRON HCL 4 MG/2ML IJ SOLN
INTRAMUSCULAR | Status: AC
  Filled 2015-06-14: qty 2

## 2015-06-14 MED ORDER — PROPOFOL 10 MG/ML IV BOLUS
INTRAVENOUS | Status: DC | PRN
  Administered 2015-06-14: 40 mg via INTRAVENOUS
  Administered 2015-06-14: 160 mg via INTRAVENOUS

## 2015-06-14 MED ORDER — MIDAZOLAM HCL 5 MG/5ML IJ SOLN
INTRAMUSCULAR | Status: DC | PRN
  Administered 2015-06-14: 2 mg via INTRAVENOUS

## 2015-06-14 SURGICAL SUPPLY — 39 items
BAG DRAIN URO-CYSTO SKYTR STRL (DRAIN) ×3 IMPLANT
BASKET DAKOTA 1.9FR 11X120 (BASKET) IMPLANT
BASKET LASER NITINOL 1.9FR (BASKET) IMPLANT
BASKET STNLS GEMINI 4WIRE 3FR (BASKET) IMPLANT
BASKET ZERO TIP NITINOL 2.4FR (BASKET) IMPLANT
CATH INTERMIT  6FR 70CM (CATHETERS) ×3 IMPLANT
CATH URET 5FR 28IN CONE TIP (BALLOONS)
CATH URET 5FR 28IN OPEN ENDED (CATHETERS) IMPLANT
CATH URET 5FR 70CM CONE TIP (BALLOONS) IMPLANT
CLOTH BEACON ORANGE TIMEOUT ST (SAFETY) ×3 IMPLANT
ELECT REM PT RETURN 9FT ADLT (ELECTROSURGICAL)
ELECTRODE REM PT RTRN 9FT ADLT (ELECTROSURGICAL) IMPLANT
FIBER LASER FLEXIVA 365 (UROLOGICAL SUPPLIES) ×3 IMPLANT
FIBER LASER TRAC TIP (UROLOGICAL SUPPLIES) IMPLANT
GLOVE BIO SURGEON STRL SZ 6.5 (GLOVE) ×2 IMPLANT
GLOVE BIO SURGEON STRL SZ7.5 (GLOVE) ×3 IMPLANT
GLOVE BIO SURGEONS STRL SZ 6.5 (GLOVE) ×1
GLOVE INDICATOR 6.5 STRL GRN (GLOVE) ×3 IMPLANT
GLOVE SURG SS PI 7.5 STRL IVOR (GLOVE) ×6 IMPLANT
GOWN STRL REUS W/ TWL LRG LVL3 (GOWN DISPOSABLE) ×1 IMPLANT
GOWN STRL REUS W/ TWL XL LVL3 (GOWN DISPOSABLE) ×1 IMPLANT
GOWN STRL REUS W/TWL LRG LVL3 (GOWN DISPOSABLE) ×2
GOWN STRL REUS W/TWL XL LVL3 (GOWN DISPOSABLE) ×2
GUIDEWIRE 0.038 PTFE COATED (WIRE) IMPLANT
GUIDEWIRE ANG ZIPWIRE 038X150 (WIRE) IMPLANT
GUIDEWIRE STR DUAL SENSOR (WIRE) ×3 IMPLANT
IV NS IRRIG 3000ML ARTHROMATIC (IV SOLUTION) ×3 IMPLANT
KIT BALLIN UROMAX 15FX10 (LABEL) IMPLANT
KIT BALLN UROMAX 15FX4 (MISCELLANEOUS) IMPLANT
KIT BALLN UROMAX 26 75X4 (MISCELLANEOUS)
KIT ROOM TURNOVER WOR (KITS) ×3 IMPLANT
MANIFOLD NEPTUNE II (INSTRUMENTS) ×3 IMPLANT
PACK CYSTO (CUSTOM PROCEDURE TRAY) ×3 IMPLANT
SET HIGH PRES BAL DIL (LABEL)
SYRINGE 10CC LL (SYRINGE) ×3 IMPLANT
SYRINGE IRR TOOMEY STRL 70CC (SYRINGE) IMPLANT
TUBE CONNECTING 12'X1/4 (SUCTIONS)
TUBE CONNECTING 12X1/4 (SUCTIONS) IMPLANT
TUBE FEEDING 8FR 16IN STR KANG (MISCELLANEOUS) IMPLANT

## 2015-06-14 NOTE — Anesthesia Procedure Notes (Signed)
Procedure Name: LMA Insertion Date/Time: 06/14/2015 11:48 AM Performed by: Wanita Chamberlain Pre-anesthesia Checklist: Patient identified, Timeout performed, Emergency Drugs available, Suction available and Patient being monitored Patient Re-evaluated:Patient Re-evaluated prior to inductionOxygen Delivery Method: Circle system utilized Preoxygenation: Pre-oxygenation with 100% oxygen Intubation Type: IV induction Ventilation: Mask ventilation without difficulty LMA: LMA inserted LMA Size: 4.0 Number of attempts: 1 Placement Confirmation: positive ETCO2 and breath sounds checked- equal and bilateral Tube secured with: Tape Dental Injury: Teeth and Oropharynx as per pre-operative assessment

## 2015-06-14 NOTE — Op Note (Signed)
NAMEGIROLAMO, Anthony [name] NO.:  [id number]  MEDICAL RECORD NO.:  [id number]  LOCATION:                                 FACILITY:  PHYSICIAN:  Alexis Frock, MD     DATE OF BIRTH:  [birth year]  DATE OF PROCEDURE: 06/14/2015                              OPERATIVE REPORT  PREOPERATIVE DIAGNOSIS:  Recurrent urolithiasis, suspected right distal ureteral stone versus bladder stone.  POSTOPERATIVE DIAGNOSIS:  Bladder stone.  PROCEDURES: 1. Cystoscopy with bilateral retrograde pyelograms and interpretation. 2. Laser cystolitholapaxy stone less than 2 cm.  ESTIMATED BLOOD LOSS:  Nil.  COMPLICATIONS:  None.  SPECIMEN:  Bladder stone fragments for compositional analysis.  FINDINGS: 1. Ovoid approximately 1.5-cm free-floating bladder stone, this was     felt to likely represent prior calcifications noted on KUB. 2. Unremarkable bilateral retrograde pyelograms, no evidence of     filling defects or hydronephrosis whatsoever. 3. Complete resolution of all bladder stone fragments following laser     lithotripsy and irrigation.  INDICATION:  Anthony [name] is a very pleasant 70 year old gentleman with history of recurrent urolithiasis.  He is status post prior percutaneous approach procedure several years ago.  He was found on surveillance of stones to have a new radiodensity by plain film, which was felt to likely be a very distal right ureteral stone versus bladder stone. Options were discussed for management including surveillance versus endoscopic exam with cystolitholapaxy versus ureteroscopy and he wished to proceed with the latter.  The goal being to avoid progression of stone such a large size to where it would require more invasive procedure such as percutaneous approach surgery or open surgery. Informed consent was obtained and placed in the medical record.  PROCEDURE IN DETAIL:  The patient being Anthony [name] was verified. Procedure being right ureteroscopic  stone manipulation was confirmed. Procedure was carried out.  Time-out was performed.  Intravenous antibiotics were administered.  General LMA anesthesia was introduced. The patient was placed into a low lithotomy position and sterile field was created by prepping and draping the patient's penis, perineum, and proximal thighs using iodine x3.  Next, cystourethroscopy was performed using a 23-French rigid cystoscope with 12-degree offset lens. Inspection of the anterior and posterior urethra revealed some moderate lateral lobe prostatic hypertrophy.  Inspection of the urinary bladder revealed mild trabeculation, ureteral orifice was in normal anatomic position, the efflux of clear urine.  There was a free-floating stone approximately 1.5 cm within the lumen of the urinary bladder.  This was felt to likely represent the prior radiodensity seen on imaging to help verify this, it was felt that bilateral pyelography would clearly be warranted.  As such, the left ureteral orifice was cannulated with a 6- French end-hole catheter and left retrograde pyelogram was obtained.  Left retrograde pyelogram demonstrated a single left ureter with single- system left kidney.  No filling defects or narrowing noted.  Similarly, right retrograde pyelogram was obtained.  Right retrograde pyelogram demonstrated a single right ureter with single-system right kidney.  No filling defects or narrowing noted. This clearly favored the bladder stone being the stone in question.  As the goal today was stone free, it was elected to  proceed with cystolitholapaxy.  The stone did appear to be too large for simple basketing for simple removal.  As such, holmium laser energy applied to the stone using a 365 nm fiber using settings of 0.6 joules and 20 Hz fragmenting the stone approximately four smaller pieces, which then were able to be irrigated via the 23-French cystoscope, which was set aside for compositional  analysis.  The bladder was irrigated several times to irrigate several small stone fragments, following which, there was complete hemostasis and no evidence of perforation, no evidence of residual stone fragments, larger than approximately 1 mm.  Bladder was emptied per cystoscope, procedure was then terminated.  The patient tolerated the procedure well.  There were no immediate periprocedural complications.  The patient was taken to the postanesthesia care unit in stable condition.          ______________________________ Alexis Frock, MD     TM/MEDQ  D:  06/14/2015  T:  06/14/2015  Job:  [id number]

## 2015-06-14 NOTE — H&P (Signed)
Anthony [name] is an 70 y.o. male.    Chief Complaint: Pre-op RIGHT ureteroscopic stone manipulation  HPI:    1 - Nephrolithiasis -  09/2013 - Left PCNL / Stent for about 2-3 cm left renal pelvis stone that had progressively enlarged since 2010. Rt 74mm non-obstructing renal stone present and not addressed.   Recent Surveillance: 05/2014 KUB, Renal US - left stone free, stable Rt punctate renal 05/2015 KUB, Renal US, BMP - K normal, Likely Rt 28mm RJV stone w/o hydro.   2 - - Medical Stone Disease / Oliguria / Hypocitraturia -  Eval 2015: BMP,PTH,Urate - normal; Composition - 80% CaOx / 20% CaPO4 ; 24 Hr Urines - low vol (1-1.5L), low citrate (200) --> Increased hydration and starting KCit 10MEQ TID meals.  PMH sig for CAD/MI/Stent (now ASA only and no deficts), HTN, HLD. No prior chest or abdominal sugery.   Today "Anthony [name]" is seen to proceed with right ureteroscopic stone manipulation for his right distal ureteral stone. NO interval fevers. Most recent UCX negative. Goal is to address stone while still amenable to retrograde endoscopic approach.   Past Medical History  Diagnosis Date  . GERD (gastroesophageal reflux disease)   . Hyperlipidemia   . Hypertension     8-10 years  . Cataract   . CAD (coronary artery disease) March 2011    with a non-ST elevation myocardial infarction. cardiac catherization whic revealed-left main was normal. The LAD had proximal calcification. There was long proximal 30% stenosis. Circumflex inthe AV groove had  proximal  60% stenosis at a mid obtuse marginal. This first large mid obtuse marginal had ostial 70% stenosis. The right coronary artery had a proximal long 50% stenosis.   . Kidney stone   . External hemorrhoids     large external  . Kidney stones 07/20/2012  . Personal history of colonic polyps 04/05/2013    Past Surgical History  Procedure Laterality Date  . Cataract extraction Bilateral 2013  . Coronary angioplasty with stent placement  2011   . Colonoscopy  2009    polyp-[facility], 2015- x4 polyps removed  . Nephrolithotomy Left 09/29/2013    Procedure: NEPHROLITHOTOMY PERCUTANEOUS WITH ACCESS;  Surgeon: Alexis Frock, MD;  Location: [facility];  Service: Urology;  Laterality: Left;  . Cystoscopy w/ retrogrades Bilateral 09/29/2013    Procedure: CYSTOSCOPY WITH RETROGRADE PYELOGRAM;  Surgeon: Alexis Frock, MD;  Location: [facility];  Service: Urology;  Laterality: Bilateral;  . Ureteroscopy N/A 09/29/2013    Procedure: URETEROSCOPY;  Surgeon: Alexis Frock, MD;  Location: [facility];  Service: Urology;  Laterality: N/A;    Family History  Problem Relation Age of Onset  . Coronary artery disease Father     in his 22's  . Hyperlipidemia Father   . Hypertension Father   . Hearing loss Father   . Heart disease Mother     deceased of heart disease  . Hypertension Mother   . Hyperlipidemia Mother   . Other Neg Hx     no lung cancer, colon cancer, or prostate  . Colon cancer Neg Hx   . Multiple sclerosis Son   . Heart disease Maternal Grandmother   . Heart disease Maternal Grandfather   . Heart disease Paternal Grandmother   . Heart disease Paternal Grandfather   . GER disease Sister    Social History:  reports that he quit smoking about 8 years ago. He has never used smokeless tobacco. He reports that he does not drink alcohol or use  illicit drugs.  Allergies: No Known Allergies  No prescriptions prior to admission    No results found for this or any previous visit (from the past 48 hour(s)). No results found.  Review of Systems  Constitutional: Negative.  Negative for fever and chills.  HENT: Negative.   Eyes: Negative.   Respiratory: Negative.   Cardiovascular: Negative.   Gastrointestinal: Negative.   Genitourinary: Negative.   Musculoskeletal: Negative.   Skin: Negative.   Neurological: Negative.   Endo/Heme/Allergies: Negative.   Psychiatric/Behavioral: Negative.     Height 5\' 5"  (1.651 m), weight 79.379 kg (175  lb). Physical Exam  Constitutional: He appears well-developed.  HENT:  Head: Normocephalic.  Eyes: Pupils are equal, round, and reactive to light.  Neck: Normal range of motion.  Cardiovascular: Normal rate.   Respiratory: Effort normal.  GI: Soft.  Genitourinary: Penis normal.  NO CVAT at present  Musculoskeletal: Normal range of motion.  Neurological: He is alert.  Skin: Skin is warm.  Psychiatric: He has a normal mood and affect. His behavior is normal. Thought content normal.     Assessment/Plan   1 - Nephrolithiasis - stone free in kidneys, but not likely large rt UVJ sotne v. bladder stone. Discussed observation v. URS as first choices and we both agree that URS warratned in elective setting.  We rediscussed ureteroscopic stone manipulation with basketing and laser-lithotripsy in detail.  We rediscussed risks including bleeding, infection, damage to kidney / ureter  bladder, rarely loss of kidney. We discussed anesthetic risks and rare but serious surgical complications including DVT, PE, MI, and mortality. We specifically readdressed that in 5-10% of cases a staged approach is required with stenting followed by re-attempt ureteroscopy if anatomy unfavorable.   The patient voiced understanding and wishes to proceed today as planned.    2 -Medical Stone Disease / Oliguria / Hypocitraturia -  continue current regimen.   Alexis Frock, MD 06/14/2015, 7:18 AM

## 2015-06-14 NOTE — Discharge Instructions (Signed)
CYSTOSCOPY HOME CARE INSTRUCTIONS  Activity: Rest for the remainder of the day.  Do not drive or operate equipment today.  You may resume normal activities in one to two days as instructed by your physician.   Meals: Drink plenty of liquids and eat light foods such as gelatin or soup this evening.  You may return to a normal meal plan tomorrow.  Return to Work: You may return to work in one to two days or as instructed by your physician.  Special Instructions / Symptoms: Call your physician if any of these symptoms occur:   -persistent or heavy bleeding  -bleeding which continues after first few urination  -large blood clots that are difficult to pass  -urine stream diminishes or stops completely  -fever equal to or higher than 101 degrees Farenheit.  -cloudy urine with a strong, foul odor  -severe pain  Females should always wipe from front to back after elimination.  You may feel some burning pain when you urinate.  This should disappear with time.  Applying moist heat to the lower abdomen or a hot tub bath may help relieve the pain.   1 - You may have urinary urgency (bladder spasms) and bloody urine on / off x few days. This is normal.  2 - Call MD or go to ER for fever >102, severe pain / nausea / vomiting not relieved by medications, or acute change in medical status   Follow-Up / Date of Return Visit to Your Physician:   Call for an appointment to arrange follow-up.  Patient Signature:  ________________________________________________________  Nurse's Signature:  ________________________________________________________     Post Anesthesia Home Care Instructions  Activity: Get plenty of rest for the remainder of the day. A responsible adult should stay with you for 24 hours following the procedure.  For the next 24 hours, DO NOT: -Drive a car -Paediatric nurse -Drink alcoholic beverages -Take any medication unless instructed by your physician -Make any legal  decisions or sign important papers.  Meals: Start with liquid foods such as gelatin or soup. Progress to regular foods as tolerated. Avoid greasy, spicy, heavy foods. If nausea and/or vomiting occur, drink only clear liquids until the nausea and/or vomiting subsides. Call your physician if vomiting continues.  Special Instructions/Symptoms: Your throat may feel dry or sore from the anesthesia or the breathing tube placed in your throat during surgery. If this causes discomfort, gargle with warm salt water. The discomfort should disappear within 24 hours.  If you had a scopolamine patch placed behind your ear for the management of post- operative nausea and/or vomiting:  1. The medication in the patch is effective for 72 hours, after which it should be removed.  Wrap patch in a tissue and discard in the trash. Wash hands thoroughly with soap and water. 2. You may remove the patch earlier than 72 hours if you experience unpleasant side effects which may include dry mouth, dizziness or visual disturbances. 3. Avoid touching the patch. Wash your hands with soap and water after contact with the patch.

## 2015-06-14 NOTE — Anesthesia Preprocedure Evaluation (Addendum)
Anesthesia Evaluation  Patient identified by MRN, date of birth, ID band Patient awake    Reviewed: Allergy & Precautions, NPO status , Patient's Chart, lab work & pertinent test results, reviewed documented beta blocker date and time   Airway Mallampati: II  TM Distance: <3 FB     Dental  (+) Teeth Intact, Dental Advisory Given   Pulmonary former smoker,    Pulmonary exam normal        Cardiovascular hypertension, Pt. on medications and Pt. on home beta blockers + CAD, + Cardiac Stents and + Peripheral Vascular Disease  Normal cardiovascular exam Rhythm:Regular Rate:Normal     Neuro/Psych    GI/Hepatic GERD  Medicated and Controlled,  Endo/Other    Renal/GU Renal diseaseRenal calculus     Musculoskeletal   Abdominal   Peds  Hematology   Anesthesia Other Findings   Reproductive/Obstetrics                           Anesthesia Physical Anesthesia Plan  ASA: III  Anesthesia Plan: General   Post-op Pain Management:    Induction: Intravenous  Airway Management Planned: LMA and Oral ETT  Additional Equipment:   Intra-op Plan:   Post-operative Plan: Extubation in OR  Informed Consent: I have reviewed the patients History and Physical, chart, labs and discussed the procedure including the risks, benefits and alternatives for the proposed anesthesia with the patient or authorized representative who has indicated his/her understanding and acceptance.     Plan Discussed with: CRNA, Anesthesiologist and Surgeon  Anesthesia Plan Comments:         Anesthesia Quick Evaluation

## 2015-06-14 NOTE — Transfer of Care (Signed)
Immediate Anesthesia Transfer of Care Note  Patient: Anthony [name]  Procedure(s) Performed: Procedure(s) (LRB): CYSTOSCOPY WITH RETROGRADE PYELOGRAM, cysto litholapexy  (N/A) HOLMIUM LASER APPLICATION bladder stone (N/A)  Patient Location: PACU  Anesthesia Type: General  Level of Consciousness: awake, sedated, patient cooperative and responds to stimulation  Airway & Oxygen Therapy: Patient Spontanous Breathing and Patient connected to [facility] oxygen  Post-op Assessment: Report given to PACU RN, Post -op Vital signs reviewed and stable and Patient moving all extremities  Post vital signs: Reviewed and stable  Complications: No apparent anesthesia complications

## 2015-06-14 NOTE — Anesthesia Postprocedure Evaluation (Signed)
Anesthesia Post Note  Patient: KIRTAN MORTEL  Procedure(s) Performed: Procedure(s) (LRB): CYSTOSCOPY WITH RETROGRADE PYELOGRAM, cysto litholapexy  (N/A) HOLMIUM LASER APPLICATION bladder stone (N/A)  Patient location during evaluation: PACU Anesthesia Type: General Level of consciousness: awake and alert Pain management: pain level controlled Vital Signs Assessment: post-procedure vital signs reviewed and stable Respiratory status: spontaneous breathing, nonlabored ventilation, respiratory function stable and patient connected to nasal cannula oxygen Cardiovascular status: blood pressure returned to baseline and stable Postop Assessment: no signs of nausea or vomiting Anesthetic complications: no    Last Vitals:  Filed Vitals:   06/14/15 1245 06/14/15 1300  BP: 128/73 122/73  Pulse: 83 72  Temp:    Resp: 14 14    Last Pain: There were no vitals filed for this visit.               Alexis Frock

## 2015-06-14 NOTE — Brief Op Note (Signed)
06/14/2015  12:09 PM  PATIENT:  Bishop Limbo  70 y.o. male  PRE-OPERATIVE DIAGNOSIS:  RIGHT URETERAL STONE  POST-OPERATIVE DIAGNOSIS:  BLADDER STONE  PROCEDURE:  Procedure(s): CYSTOSCOPY WITH RETROGRADE PYELOGRAM, cysto litholapexy  (N/A) HOLMIUM LASER APPLICATION bladder stone (N/A)  SURGEON:  Surgeon(s) and Role:    * Alexis Frock, MD - Primary  PHYSICIAN ASSISTANT:   ASSISTANTS: none   ANESTHESIA:   general  EBL:     BLOOD ADMINISTERED:none  DRAINS: none   LOCAL MEDICATIONS USED:  NONE  SPECIMEN:  Source of Specimen:  bladder stone fragments  DISPOSITION OF SPECIMEN:  [facility] for compositional analysis  COUNTS:  YES  TOURNIQUET:  * No tourniquets in log *  DICTATION: .Other Dictation: Dictation Number [id number]  PLAN OF CARE: Discharge to home after PACU  PATIENT DISPOSITION:  PACU - hemodynamically stable.   Delay start of Pharmacological VTE agent (>24hrs) due to surgical blood loss or risk of bleeding: yes

## 2015-06-15 ENCOUNTER — Encounter (HOSPITAL_BASED_OUTPATIENT_CLINIC_OR_DEPARTMENT_OTHER): Payer: Self-pay | Admitting: Urology

## 2015-06-25 ENCOUNTER — Other Ambulatory Visit: Payer: Self-pay | Admitting: Physician Assistant

## 2015-06-25 NOTE — Telephone Encounter (Signed)
Will defer further refills of patient's medications to PCP  

## 2015-07-03 DIAGNOSIS — R8299 Other abnormal findings in urine: Secondary | ICD-10-CM | POA: Diagnosis not present

## 2015-07-03 DIAGNOSIS — R34 Anuria and oliguria: Secondary | ICD-10-CM | POA: Diagnosis not present

## 2015-07-03 DIAGNOSIS — Z0000 Encounter for general adult medical examination without abnormal findings: Secondary | ICD-10-CM | POA: Diagnosis not present

## 2015-07-03 DIAGNOSIS — N200 Calculus of kidney: Secondary | ICD-10-CM | POA: Diagnosis not present

## 2015-09-26 DIAGNOSIS — H35721 Serous detachment of retinal pigment epithelium, right eye: Secondary | ICD-10-CM | POA: Diagnosis not present

## 2015-09-26 DIAGNOSIS — H353130 Nonexudative age-related macular degeneration, bilateral, stage unspecified: Secondary | ICD-10-CM | POA: Diagnosis not present

## 2015-09-26 DIAGNOSIS — H35371 Puckering of macula, right eye: Secondary | ICD-10-CM | POA: Diagnosis not present

## 2015-09-26 DIAGNOSIS — Z961 Presence of intraocular lens: Secondary | ICD-10-CM | POA: Diagnosis not present

## 2015-10-12 ENCOUNTER — Ambulatory Visit (INDEPENDENT_AMBULATORY_CARE_PROVIDER_SITE_OTHER): Payer: Medicare Other | Admitting: Family Medicine

## 2015-10-12 ENCOUNTER — Encounter: Payer: Self-pay | Admitting: Family Medicine

## 2015-10-12 VITALS — BP 132/66 | HR 62 | Temp 97.9°F | Resp 16 | Ht 65.0 in | Wt 173.2 lb

## 2015-10-12 DIAGNOSIS — E782 Mixed hyperlipidemia: Secondary | ICD-10-CM

## 2015-10-12 DIAGNOSIS — K219 Gastro-esophageal reflux disease without esophagitis: Secondary | ICD-10-CM | POA: Diagnosis not present

## 2015-10-12 DIAGNOSIS — I6523 Occlusion and stenosis of bilateral carotid arteries: Secondary | ICD-10-CM | POA: Diagnosis not present

## 2015-10-12 DIAGNOSIS — I10 Essential (primary) hypertension: Secondary | ICD-10-CM | POA: Diagnosis not present

## 2015-10-12 DIAGNOSIS — R739 Hyperglycemia, unspecified: Secondary | ICD-10-CM | POA: Diagnosis not present

## 2015-10-12 DIAGNOSIS — N200 Calculus of kidney: Secondary | ICD-10-CM

## 2015-10-12 DIAGNOSIS — J309 Allergic rhinitis, unspecified: Secondary | ICD-10-CM

## 2015-10-12 LAB — COMPREHENSIVE METABOLIC PANEL
ALBUMIN: 4.3 g/dL (ref 3.5–5.2)
ALK PHOS: 74 U/L (ref 39–117)
ALT: 15 U/L (ref 0–53)
AST: 14 U/L (ref 0–37)
BILIRUBIN TOTAL: 0.8 mg/dL (ref 0.2–1.2)
BUN: 14 mg/dL (ref 6–23)
CALCIUM: 9.2 mg/dL (ref 8.4–10.5)
CHLORIDE: 103 meq/L (ref 96–112)
CO2: 28 mEq/L (ref 19–32)
CREATININE: 0.78 mg/dL (ref 0.40–1.50)
GFR: 104.5 mL/min (ref >60.00)
Glucose, Bld: 81 mg/dL (ref 70–99)
Potassium: 3.6 mEq/L (ref 3.5–5.1)
SODIUM: 139 meq/L (ref 135–145)
TOTAL PROTEIN: 7.6 g/dL (ref 6.0–8.3)

## 2015-10-12 LAB — CBC
HCT: 39.2 % (ref 39.0–52.0)
Hemoglobin: 13.6 g/dL (ref 13.0–17.0)
MCHC: 34.5 g/dL (ref 30.0–36.0)
MCV: 91.3 fl (ref 78.0–100.0)
PLATELETS: 243 10*3/uL (ref 150.0–400.0)
RBC: 4.3 Mil/uL (ref 4.22–5.81)
RDW: 12.9 % (ref 11.5–15.5)
WBC: 8.4 10*3/uL (ref 4.0–10.5)

## 2015-10-12 LAB — LIPID PANEL
CHOLESTEROL: 94 mg/dL (ref 0–200)
HDL: 36 mg/dL — ABNORMAL LOW (ref >39.00)
LDL CALC: 28 mg/dL (ref 0–99)
NonHDL: 57.93
TRIGLYCERIDES: 152 mg/dL — AB (ref 0.0–149.0)
Total CHOL/HDL Ratio: 3
VLDL: 30.4 mg/dL (ref 0.0–40.0)

## 2015-10-12 LAB — TSH: TSH: 2.57 u[IU]/mL (ref 0.35–4.50)

## 2015-10-12 MED ORDER — BENZONATATE 100 MG PO CAPS: 100.0000 mg | ORAL_CAPSULE | Freq: Two times a day (BID) | ORAL | 1 refills | Status: DC | PRN

## 2015-10-12 NOTE — Progress Notes (Signed)
Pre visit review using our clinic review tool, if applicable. No additional management support is needed unless otherwise documented below in the visit note. 

## 2015-10-12 NOTE — Progress Notes (Signed)
Subjective:     Patient ID: Anthony [name], male   DOB: 01-25-1946, 70 y.o.   MRN: [id number]  HPI  70 yo male here for routine follow-up. He has no major complaints today and endorses feeling well overall with no major changes in health or medical care. He was recently admitted for a cystoscopy (04/05) following finding of a kidney stone in the bladder on routine screening. He denies any complications following the procedure. Denies recent infectious history, CP, SOB, dyspnea, HA, changes in vision, skin changes, changes in bowel habits or fatigue.  Review of Systems  Constitutional: Negative for activity change and appetite change.  Eyes: Negative.   Respiratory: Negative.   Cardiovascular: Negative.   Gastrointestinal: Negative for abdominal distention, abdominal pain, constipation and diarrhea.  Genitourinary: Negative for decreased urine volume, difficulty urinating, dysuria and urgency.       Cystoscopy 06/14/15. Asymptomatic. Finding on routine exam for history of Kidney Stones.        Past Medical History:  Diagnosis Date  . CAD (coronary artery disease) March 2011   with a non-ST elevation myocardial infarction. cardiac catherization whic revealed-left main was normal. The LAD had proximal calcification. There was long proximal 30% stenosis. Circumflex inthe AV groove had  proximal  60% stenosis at a mid obtuse marginal. This first large mid obtuse marginal had ostial 70% stenosis. The right coronary artery had a proximal long 50% stenosis.   . Cataract   . External hemorrhoids    large external  . GERD (gastroesophageal reflux disease)   . Hyperlipidemia   . Hypertension    8-10 years  . Kidney stone   . Kidney stones 07/20/2012  . Personal history of colonic polyps 04/05/2013   Social History   Social History  . Marital status: Married    Spouse name: N/A  . Number of children: N/A  . Years of education: N/A   Occupational History  . Not on file.   Social History  Main Topics  . Smoking status: Former Smoker    Quit date: 03/12/2007  . Smokeless tobacco: Never Used     Comment: 40 pack year history  . Alcohol use No  . Drug use: No  . 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 Not on file   Social History Narrative   married -12 yrs  [name] [place])   grew up in [place]   He has one son - two grandaugthers     He is retired.  He had     a 40 pack-year smoking history but has discontinued.     prev occuptation -  [employer]   Alcohol use-no   Smoking Status:  quit   Packs/Day:  0.5   Caffeine use/day:  2 beverages daily   Does Patient Exercise:  yes            Past Surgical History:  Procedure Laterality Date  . CATARACT EXTRACTION Bilateral 2013  . COLONOSCOPY  2009   polyp-[facility], 2015- x4 polyps removed  . CORONARY ANGIOPLASTY WITH STENT PLACEMENT  2011  . CYSTOSCOPY W/ RETROGRADES Bilateral 09/29/2013   Procedure: CYSTOSCOPY WITH RETROGRADE PYELOGRAM;  Surgeon: Alexis Frock, MD;  Location: [facility];  Service: Urology;  Laterality: Bilateral;  . CYSTOSCOPY WITH RETROGRADE PYELOGRAM, URETEROSCOPY AND STENT PLACEMENT N/A 06/14/2015   Procedure: CYSTOSCOPY WITH RETROGRADE PYELOGRAM, cysto litholapexy ;  Surgeon:  Alexis Frock, MD;  Location: [facility];  Service: Urology;  Laterality: N/A;  . HOLMIUM LASER APPLICATION N/A 0000000   Procedure: HOLMIUM LASER APPLICATION bladder stone;  Surgeon: Alexis Frock, MD;  [facility];  Service: Urology;  Laterality: N/A;  . NEPHROLITHOTOMY Left 09/29/2013   Procedure: NEPHROLITHOTOMY PERCUTANEOUS WITH ACCESS;  Surgeon: Alexis Frock, MD;  Location: [facility];  Service: Urology;  Laterality: Left;  . URETEROSCOPY N/A 09/29/2013   Procedure: URETEROSCOPY;  Surgeon: Alexis Frock, MD;  Location: [facility];  Service: Urology;  Laterality: N/A;   Family History   Problem Relation Age of Onset  . Coronary artery disease Father     in his 53's  . Hyperlipidemia Father   . Hypertension Father   . Hearing loss Father   . Heart disease Mother     deceased of heart disease  . Hypertension Mother   . Hyperlipidemia Mother   . Other Neg Hx     no lung cancer, colon cancer, or prostate  . Colon cancer Neg Hx   . Multiple sclerosis Son   . Heart disease Maternal Grandmother   . Heart disease Maternal Grandfather   . Heart disease Paternal Grandmother   . Heart disease Paternal Grandfather   . GER disease Sister    No Known Allergies   Outpatient Encounter Prescriptions as of 10/12/2015  Medication Sig Note  . amLODipine (NORVASC) 10 MG tablet Take 1 tablet by mouth  every morning   . aspirin 81 MG tablet Take 81 mg by mouth daily.    Marland Kitchen losartan (COZAAR) 100 MG tablet Take 1 tablet by mouth  every morning   . metoprolol tartrate (LOPRESSOR) 25 MG tablet Take one-half tablet by  mouth twice a day   . Multiple Vitamins-Minerals (ICAPS PO) Take by mouth daily.   . potassium citrate (UROCIT-K) 10 MEQ (1080 MG) SR tablet TK 1 T PO TID 11/30/2014: Patient stated he received this from his urologist  . ranitidine (ZANTAC) 300 MG tablet Take 1 tablet by mouth at  bedtime (Patient taking differently: Take 1 tablet by mouth at  bedtime PRN)   . rosuvastatin (CRESTOR) 40 MG tablet Take 1 tablet by mouth  daily   . [DISCONTINUED] senna-docusate (SENOKOT-S) 8.6-50 MG tablet Take 1 tablet by mouth 2 (two) times daily. While taking pain meds to prevent constipation (Patient not taking: Reported on 10/12/2015)   . [DISCONTINUED] traMADol (ULTRAM) 50 MG tablet Take 1-2 tablets (50-100 mg total) by mouth every 6 (six) hours as needed. Post-operatively (Patient not taking: Reported on 10/12/2015)    No facility-administered encounter medications on file as of 10/12/2015.    Objective:   Physical Exam  Constitutional: He is oriented to person, place, and time. He appears  well-developed and well-nourished.  HENT:  Head: Normocephalic and atraumatic.  Mouth/Throat: Oropharynx is clear and moist.  Eyes: Conjunctivae and EOM are normal. Pupils are equal, round, and reactive to light.  Neck: Normal range of motion. Neck supple. No tracheal deviation present. No thyromegaly present.  Cardiovascular: Normal rate, regular rhythm and normal heart sounds.   Pulmonary/Chest: Effort normal and breath sounds normal.  Abdominal: Soft. Bowel sounds are normal. He exhibits no distension.  Neurological: He is alert and oriented to person, place, and time. No cranial nerve deficit.  Skin: Skin is warm and dry.  Psychiatric: He has a normal mood and affect. His behavior is normal.       Vitals:   10/12/15  0817  BP: 132/66  Pulse: 62  Resp: 16  Temp: 97.9 F (36.6 C)   Assessment:     Problem List Items Addressed This Visit      Cardiovascular and Mediastinum   Essential hypertension   Relevant Orders   TSH   CBC   Comprehensive metabolic panel     Digestive   GERD - Primary   Relevant Orders   TSH     Genitourinary   Kidney stones     Other   Hyperlipidemia, mixed   Relevant Orders   Lipid panel   Hyperglycemia    Other Visit Diagnoses   None.       Plan:    Hypertension: Labs ordered for today. BP today was 132/66. Remains stable on Norvasc, Cozaar and metoprolol. Patient notes he is checking regularly and endorses good control. Cardiology Kirk Ruths, MD) is also following Will continue to monitor at 6 mo FUP. GERD- no complaints of this at this time. Continue on ranitidine. Hyperlipidemia- Labs ordered for today with lipid panel. Continue on Crestor.  Nephrolithiasis- following Urology with yearly FUP. Will proceed according to their plan. Patient advised to stay well hydrated and educated on DASH diet.      Patient seen with and examined with student.  Agree with documentation See separate note for further documentation

## 2015-10-12 NOTE — Patient Instructions (Addendum)
Try Elderberry liquid or Tessalon Perles for cough   Hypertension Hypertension, commonly called high blood pressure, is when the force of blood pumping through your arteries is too strong. Your arteries are the blood vessels that carry blood from your heart throughout your body. A blood pressure reading consists of a higher number over a lower number, such as 110/72. The higher number (systolic) is the pressure inside your arteries when your heart pumps. The lower number (diastolic) is the pressure inside your arteries when your heart relaxes. Ideally you want your blood pressure below 120/80. Hypertension forces your heart to work harder to pump blood. Your arteries may become narrow or stiff. Having untreated or uncontrolled hypertension can cause heart attack, stroke, kidney disease, and other problems. RISK FACTORS Some risk factors for high blood pressure are controllable. Others are not.  Risk factors you cannot control include:   Race. You may be at higher risk if you are African American.  Age. Risk increases with age.  Gender. Men are at higher risk than women before age 64 years. After age 91, women are at higher risk than men. Risk factors you can control include:  Not getting enough exercise or physical activity.  Being overweight.  Getting too much fat, sugar, calories, or salt in your diet.  Drinking too much alcohol. SIGNS AND SYMPTOMS Hypertension does not usually cause signs or symptoms. Extremely high blood pressure (hypertensive crisis) may cause headache, anxiety, shortness of breath, and nosebleed. DIAGNOSIS To check if you have hypertension, your health care provider will measure your blood pressure while you are seated, with your arm held at the level of your heart. It should be measured at least twice using the same arm. Certain conditions can cause a difference in blood pressure between your right and left arms. A blood pressure reading that is higher than normal  on one occasion does not mean that you need treatment. If it is not clear whether you have high blood pressure, you may be asked to return on a different day to have your blood pressure checked again. Or, you may be asked to monitor your blood pressure at home for 1 or more weeks. TREATMENT Treating high blood pressure includes making lifestyle changes and possibly taking medicine. Living a healthy lifestyle can help lower high blood pressure. You may need to change some of your habits. Lifestyle changes may include:  Following the DASH diet. This diet is high in fruits, vegetables, and whole grains. It is low in salt, red meat, and added sugars.  Keep your sodium intake below 2,300 mg per day.  Getting at least 30-45 minutes of aerobic exercise at least 4 times per week.  Losing weight if necessary.  Not smoking.  Limiting alcoholic beverages.  Learning ways to reduce stress. Your health care provider may prescribe medicine if lifestyle changes are not enough to get your blood pressure under control, and if one of the following is true:  You are 15-71 years of age and your systolic blood pressure is above 140.  You are 30 years of age or older, and your systolic blood pressure is above 150.  Your diastolic blood pressure is above 90.  You have diabetes, and your systolic blood pressure is over [id number] or your diastolic blood pressure is over 90.  You have kidney disease and your blood pressure is above 140/90.  You have heart disease and your blood pressure is above 140/90. Your personal target blood pressure may vary depending on your  medical conditions, your age, and other factors. HOME CARE INSTRUCTIONS  Have your blood pressure rechecked as directed by your health care provider.   Take medicines only as directed by your health care provider. Follow the directions carefully. Blood pressure medicines must be taken as prescribed. The medicine does not work as well when you skip  doses. Skipping doses also puts you at risk for problems.  Do not smoke.   Monitor your blood pressure at home as directed by your health care provider. SEEK MEDICAL CARE IF:   You think you are having a reaction to medicines taken.  You have recurrent headaches or feel dizzy.  You have swelling in your ankles.  You have trouble with your vision. SEEK IMMEDIATE MEDICAL CARE IF:  You develop a severe headache or confusion.  You have unusual weakness, numbness, or feel faint.  You have severe chest or abdominal pain.  You vomit repeatedly.  You have trouble breathing. MAKE SURE YOU:   Understand these instructions.  Will watch your condition.  Will get help right away if you are not doing well or get worse.   This information is not intended to replace advice given to you by your health care provider. Make sure you discuss any questions you have with your health care provider.   Document Released: 02/25/2005 Document Revised: 07/12/2014 Document Reviewed: 12/18/2012 Elsevier Interactive Patient Education [employer].

## 2015-10-22 NOTE — Progress Notes (Signed)
Patient ID: Anthony [name], male   DOB: 21-Feb-1946, 70 y.o.   MRN: [id number]   Subjective:    Patient ID: Anthony [name], male    DOB: 1945-10-09, 70 y.o.   MRN: [id number]  Chief Complaint  Patient presents with  . Follow-up  . Hypertension  . Hyperlipidemia    HPI Patient is in today for follow up. He had a cystoscopy for kidney stones recently but is doing well now. No recent illness otherwise. Denies CP/palp/SOB/HA/congestion/fevers/GI or GU c/o. Taking meds as prescribed  Past Medical History:  Diagnosis Date  . CAD (coronary artery disease) March 2011   with a non-ST elevation myocardial infarction. cardiac catherization whic revealed-left main was normal. The LAD had proximal calcification. There was long proximal 30% stenosis. Circumflex inthe AV groove had  proximal  60% stenosis at a mid obtuse marginal. This first large mid obtuse marginal had ostial 70% stenosis. The right coronary artery had a proximal long 50% stenosis.   . Cataract   . External hemorrhoids    large external  . GERD (gastroesophageal reflux disease)   . Hyperlipidemia   . Hypertension    8-10 years  . Kidney stone   . Kidney stones 07/20/2012  . Personal history of colonic polyps 04/05/2013    Past Surgical History:  Procedure Laterality Date  . CATARACT EXTRACTION Bilateral 2013  . COLONOSCOPY  2009   polyp-[facility], 2015- x4 polyps removed  . CORONARY ANGIOPLASTY WITH STENT PLACEMENT  2011  . CYSTOSCOPY W/ RETROGRADES Bilateral 09/29/2013   Procedure: CYSTOSCOPY WITH RETROGRADE PYELOGRAM;  Surgeon: Alexis Frock, MD;  Location: [facility];  Service: Urology;  Laterality: Bilateral;  . CYSTOSCOPY WITH RETROGRADE PYELOGRAM, URETEROSCOPY AND STENT PLACEMENT N/A 06/14/2015   Procedure: CYSTOSCOPY WITH RETROGRADE PYELOGRAM, cysto litholapexy ;  Surgeon: Alexis Frock, MD;  Location: [facility];  Service: Urology;  Laterality: N/A;  . HOLMIUM LASER APPLICATION N/A 0000000   Procedure:  HOLMIUM LASER APPLICATION bladder stone;  Surgeon: Alexis Frock, MD;  Location: [facility];  Service: Urology;  Laterality: N/A;  . NEPHROLITHOTOMY Left 09/29/2013   Procedure: NEPHROLITHOTOMY PERCUTANEOUS WITH ACCESS;  Surgeon: Alexis Frock, MD;  Location: [facility];  Service: Urology;  Laterality: Left;  . URETEROSCOPY N/A 09/29/2013   Procedure: URETEROSCOPY;  Surgeon: Alexis Frock, MD;  [facility];  Service: Urology;  Laterality: N/A;    Family History  Problem Relation Age of Onset  . Coronary artery disease Father     in his 43's  . Hyperlipidemia Father   . Hypertension Father   . Hearing loss Father   . Heart disease Mother     deceased of heart disease  . Hypertension Mother   . Hyperlipidemia Mother   . Other Neg Hx     no lung cancer, colon cancer, or prostate  . Colon cancer Neg Hx   . Multiple sclerosis Son   . Heart disease Maternal Grandmother   . Heart disease Maternal Grandfather   . Heart disease Paternal Grandmother   . Heart disease Paternal Grandfather   . GER disease Sister     Social History   Social History  . Marital status: Married    Spouse name: N/A  . Number of children: N/A  . Years of education: N/A   Occupational History  . Not on file.   Social History Main Topics  . Smoking status: Former Smoker    Quit date: 03/12/2007  . Smokeless tobacco: Never Used  Comment: 40 pack year history  . Alcohol use No  . Drug use: No  . 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 Not on file   Social History Narrative   married -8 yrs  [name] [place])   grew up in [place]   He has one son - two grandaugthers     He is retired.  He had     a 40 pack-year smoking history but has discontinued.     prev occuptation -  [employer]   Alcohol use-no   Smoking Status:  quit   Packs/Day:  0.5   Caffeine use/day:  2 beverages  daily   Does Patient Exercise:  yes             Outpatient Medications Prior to Visit  Medication Sig Dispense Refill  . amLODipine (NORVASC) 10 MG tablet Take 1 tablet by mouth  every morning 90 tablet 0  . aspirin 81 MG tablet Take 81 mg by mouth daily.     Marland Kitchen losartan (COZAAR) 100 MG tablet Take 1 tablet by mouth  every morning 90 tablet 0  . metoprolol tartrate (LOPRESSOR) 25 MG tablet Take one-half tablet by  mouth twice a day 90 tablet 3  . Multiple Vitamins-Minerals (ICAPS PO) Take by mouth daily.    . potassium citrate (UROCIT-K) 10 MEQ (1080 MG) SR tablet TK 1 T PO TID  3  . ranitidine (ZANTAC) 300 MG tablet Take 1 tablet by mouth at  bedtime (Patient taking differently: Take 1 tablet by mouth at  bedtime PRN) 90 tablet 0  . rosuvastatin (CRESTOR) 40 MG tablet Take 1 tablet by mouth  daily 90 tablet 3  . senna-docusate (SENOKOT-S) 8.6-50 MG tablet Take 1 tablet by mouth 2 (two) times daily. While taking pain meds to prevent constipation (Patient not taking: Reported on 10/12/2015) 30 tablet 0  . traMADol (ULTRAM) 50 MG tablet Take 1-2 tablets (50-100 mg total) by mouth every 6 (six) hours as needed. Post-operatively (Patient not taking: Reported on 10/12/2015) 30 tablet 0   No facility-administered medications prior to visit.     No Known Allergies  Review of Systems  Constitutional: Negative for fever and malaise/fatigue.  HENT: Negative for congestion.   Eyes: Negative for blurred vision.  Respiratory: Negative for shortness of breath.   Cardiovascular: Negative for chest pain, palpitations and leg swelling.  Gastrointestinal: Negative for abdominal pain, blood in stool and nausea.  Genitourinary: Negative for dysuria and frequency.  Musculoskeletal: Negative for falls.  Skin: Negative for rash.  Neurological: Negative for dizziness, loss of consciousness and headaches.  Endo/Heme/Allergies: Negative for environmental allergies.  Psychiatric/Behavioral: Negative for  depression. The patient is not nervous/anxious.        Objective:    Physical Exam  Constitutional: He is oriented to person, place, and time. He appears well-developed and well-nourished. No distress.  HENT:  Head: Normocephalic and atraumatic.  Nose: Nose normal.  Eyes: Right eye exhibits no discharge. Left eye exhibits no discharge.  Neck: Normal range of motion. Neck supple.  Cardiovascular: Normal rate and regular rhythm.   No murmur heard. Pulmonary/Chest: Effort normal and breath sounds normal.  Abdominal: Soft. Bowel sounds are normal. There is no tenderness.  Musculoskeletal: He exhibits no edema.  Neurological: He is alert and oriented to person, place, and time.  Skin: Skin is warm and dry.  Psychiatric:  He has a normal mood and affect.  Nursing note and vitals reviewed.   BP 132/66 (BP Location: Right Arm, Patient Position: Sitting, Cuff Size: Normal)   Pulse 62   Temp 97.9 F (36.6 C) (Oral)   Resp 16   Ht 5\' 5"  (1.651 m)   Wt 173 lb 3.2 oz (78.6 kg)   SpO2 98%   BMI 28.82 kg/m  Wt Readings from Last 3 Encounters:  10/12/15 173 lb 3.2 oz (78.6 kg)  06/14/15 173 lb 8 oz (78.7 kg)  04/14/15 175 lb 8 oz (79.6 kg)     Lab Results  Component Value Date   WBC 8.4 10/12/2015   HGB 13.6 10/12/2015   HCT 39.2 10/12/2015   PLT 243.0 10/12/2015   GLUCOSE 81 10/12/2015   CHOL 94 10/12/2015   TRIG 152.0 (H) 10/12/2015   HDL 36.00 (L) 10/12/2015   LDLCALC 28 10/12/2015   ALT 15 10/12/2015   AST 14 10/12/2015   NA 139 10/12/2015   K 3.6 10/12/2015   CL 103 10/12/2015   CREATININE 0.78 10/12/2015   BUN 14 10/12/2015   CO2 28 10/12/2015   TSH 2.57 10/12/2015   PSA 3.32 03/24/2013   INR 1.05 05/25/2009   HGBA1C 6.1 04/14/2015    Lab Results  Component Value Date   TSH 2.57 10/12/2015   Lab Results  Component Value Date   WBC 8.4 10/12/2015   HGB 13.6 10/12/2015   HCT 39.2 10/12/2015   MCV 91.3 10/12/2015   PLT 243.0 10/12/2015   Lab Results    Component Value Date   NA 139 10/12/2015   K 3.6 10/12/2015   CO2 28 10/12/2015   GLUCOSE 81 10/12/2015   BUN 14 10/12/2015   CREATININE 0.78 10/12/2015   BILITOT 0.8 10/12/2015   ALKPHOS 74 10/12/2015   AST 14 10/12/2015   ALT 15 10/12/2015   PROT 7.6 10/12/2015   ALBUMIN 4.3 10/12/2015   CALCIUM 9.2 10/12/2015   ANIONGAP 12 09/30/2013   GFR 104.50 10/12/2015   Lab Results  Component Value Date   CHOL 94 10/12/2015   Lab Results  Component Value Date   HDL 36.00 (L) 10/12/2015   Lab Results  Component Value Date   LDLCALC 28 10/12/2015   Lab Results  Component Value Date   TRIG 152.0 (H) 10/12/2015   Lab Results  Component Value Date   CHOLHDL 3 10/12/2015   Lab Results  Component Value Date   HGBA1C 6.1 04/14/2015       Assessment & Plan:   Problem List Items Addressed This Visit    Hyperlipidemia, mixed   Relevant Orders   Lipid panel (Completed)   Essential hypertension    Well controlled, no changes to meds. Encouraged heart healthy diet such as the DASH diet and exercise as tolerated.       Relevant Orders   TSH (Completed)   CBC (Completed)   Comprehensive metabolic panel (Completed)   Allergic rhinitis    No symptoms presently. May use Zyrtec prn.       GERD - Primary    Avoid offending foods, take probiotics. Do not eat large meals in late evening and consider raising head of bed.       Relevant Orders   TSH (Completed)   Kidney stones   Hyperglycemia    minimize simple carbs. Increase exercise as tolerated.        Other Visit Diagnoses   None.     I have  discontinued Mr. Tagliaferro's traMADol and senna-docusate. I am also having him start on benzonatate. Additionally, I am having him maintain his aspirin, Multiple Vitamins-Minerals (ICAPS PO), potassium citrate, rosuvastatin, metoprolol tartrate, ranitidine, losartan, and amLODipine.  Meds ordered this encounter  Medications  . benzonatate (TESSALON) 100 MG capsule    Sig:  Take 1 capsule (100 mg total) by mouth 2 (two) times daily as needed for cough.    Dispense:  30 capsule    Refill:  1     Penni Homans, MD

## 2015-10-22 NOTE — Assessment & Plan Note (Signed)
minimize simple carbs. Increase exercise as tolerated.  

## 2015-10-22 NOTE — Assessment & Plan Note (Signed)
Well controlled, no changes to meds. Encouraged heart healthy diet such as the DASH diet and exercise as tolerated.  °

## 2015-10-22 NOTE — Assessment & Plan Note (Signed)
No symptoms presently. May use Zyrtec prn.

## 2015-10-22 NOTE — Assessment & Plan Note (Signed)
Avoid offending foods, take probiotics. Do not eat large meals in late evening and consider raising head of bed.  

## 2015-11-05 ENCOUNTER — Other Ambulatory Visit: Payer: Self-pay | Admitting: Cardiology

## 2015-11-05 ENCOUNTER — Other Ambulatory Visit: Payer: Self-pay | Admitting: Family Medicine

## 2015-11-05 DIAGNOSIS — I2510 Atherosclerotic heart disease of native coronary artery without angina pectoris: Secondary | ICD-10-CM

## 2015-11-06 NOTE — Telephone Encounter (Signed)
Rx request sent to pharmacy.  

## 2015-12-04 NOTE — Progress Notes (Signed)
HPI: FU CAD; admitted in March 2011 with a non-ST elevation myocardial infarction. He underwent cardiac catheterization which revealed - Left main was normal. The LAD had proximal calcification. There was long proximal 30% stenosis. Circumflex in the AV groove had proximal 60% stenosis at a mid obtuse marginal. This first large mid obtuse marginal had ostial 70% stenosis. The right coronary artery had a proximal long 50% stenosis. There was mid subtotal stenosis. The EF was 50% with inferior hypokinesis. Patient had a drug-eluting stent to the right coronary artery at that time. Abdominal ultrasound in July of 2011 showed no aneurysm. Nuclear study in December of 2013 showed an ejection fraction of 71% and normal perfusion. Carotid Dopplers in Jan 2017 showed 1-39 bilateral stenosis. Since I last saw him, the patient has dyspnea with more extreme activities but not with routine activities. It is relieved with rest. It is not associated with chest pain. There is no orthopnea, PND or pedal edema. There is no syncope or palpitations. There is no exertional chest pain.   Current Outpatient Prescriptions  Medication Sig Dispense Refill  . amLODipine (NORVASC) 10 MG tablet Take 1 tablet by mouth  every morning 90 tablet 0  . aspirin 81 MG tablet Take 81 mg by mouth daily.     Marland Kitchen losartan (COZAAR) 100 MG tablet Take 1 tablet by mouth  every morning 90 tablet 0  . metoprolol tartrate (LOPRESSOR) 25 MG tablet Take 0.5 tablets (12.5 mg total) by mouth 2 (two) times daily. Please schedule appointment. 90 tablet 0  . Multiple Vitamins-Minerals (ICAPS PO) Take by mouth daily.    . potassium citrate (UROCIT-K) 10 MEQ (1080 MG) SR tablet TK 1 T PO TID  3  . rosuvastatin (CRESTOR) 40 MG tablet Take 1 tablet by mouth  daily 90 tablet 3  . vitamin C (ASCORBIC ACID) 500 MG tablet Take 500 mg by mouth daily.     No current facility-administered medications for this visit.      Past Medical History:    Diagnosis Date  . CAD (coronary artery disease) March 2011   with a non-ST elevation myocardial infarction. cardiac catherization whic revealed-left main was normal. The LAD had proximal calcification. There was long proximal 30% stenosis. Circumflex inthe AV groove had  proximal  60% stenosis at a mid obtuse marginal. This first large mid obtuse marginal had ostial 70% stenosis. The right coronary artery had a proximal long 50% stenosis.   . Cataract   . External hemorrhoids    large external  . GERD (gastroesophageal reflux disease)   . Hyperlipidemia   . Hypertension    8-10 years  . Kidney stone   . Kidney stones 07/20/2012  . Personal history of colonic polyps 04/05/2013    Past Surgical History:  Procedure Laterality Date  . CATARACT EXTRACTION Bilateral 2013  . COLONOSCOPY  2009   [facility], 2015- x4 polyps removed  . CORONARY ANGIOPLASTY WITH STENT PLACEMENT  2011  . CYSTOSCOPY W/ RETROGRADES Bilateral 09/29/2013   Procedure: CYSTOSCOPY WITH RETROGRADE PYELOGRAM;  Surgeon: Alexis Frock, MD;  Location: [facility];  Service: Urology;  Laterality: Bilateral;  . CYSTOSCOPY WITH RETROGRADE PYELOGRAM, URETEROSCOPY AND STENT PLACEMENT N/A 06/14/2015   Procedure: CYSTOSCOPY WITH RETROGRADE PYELOGRAM, cysto litholapexy ;  Surgeon: Alexis Frock, MD;  Location: [facility];  Service: Urology;  Laterality: N/A;  . HOLMIUM LASER APPLICATION N/A 0000000   Procedure: HOLMIUM LASER APPLICATION bladder stone;  Surgeon: Alexis Frock, MD;  Location: [place];  Service: Urology;  Laterality: N/A;  . NEPHROLITHOTOMY Left 09/29/2013   Procedure: NEPHROLITHOTOMY PERCUTANEOUS WITH ACCESS;  Surgeon: Alexis Frock, MD;  Location[facility];  Service: Urology;  Laterality: Left;  . URETEROSCOPY N/A 09/29/2013   Procedure: URETEROSCOPY;  Surgeon: Alexis Frock, MD;  Location: [facility];  Service: Urology;  Laterality: N/A;    Social History   Social History  . Marital  status: Married    Spouse name: N/A  . Number of children: N/A  . Years of education: N/A   Occupational History  . Not on file.   Social History Main Topics  . Smoking status: Former Smoker    Quit date: 03/12/2007  . Smokeless tobacco: Never Used     Comment: 40 pack year history  . Alcohol use No  . Drug use: No  . 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 Not on file   Social History Narrative   married -71 yrs  [name] [place])   grew up in [place]   He has one son - two grandaugthers     He is retired.  He had     a 40 pack-year smoking history but has discontinued.     prev occuptation -  [employer]   Alcohol use-no   Smoking Status:  quit   Packs/Day:  0.5   Caffeine use/day:  2 beverages daily   Does Patient Exercise:  yes             Family History  Problem Relation Age of Onset  . Coronary artery disease Father     in his 55's  . Hyperlipidemia Father   . Hypertension Father   . Hearing loss Father   . Heart disease Mother     deceased of heart disease  . Hypertension Mother   . Hyperlipidemia Mother   . Other Neg Hx     no lung cancer, colon cancer, or prostate  . Colon cancer Neg Hx   . Multiple sclerosis Son   . Heart disease Maternal Grandmother   . Heart disease Maternal Grandfather   . Heart disease Paternal Grandmother   . Heart disease Paternal Grandfather   . GER disease Sister     ROS: no fevers or chills, productive cough, hemoptysis, dysphasia, odynophagia, melena, hematochezia, dysuria, hematuria, rash, seizure activity, orthopnea, PND, pedal edema, claudication. Remaining systems are negative.  Physical Exam: Well-developed well-nourished in no acute distress.  Skin is warm and dry.  HEENT is normal.  Neck is supple.  Chest is clear to auscultation with normal expansion.  Cardiovascular exam is regular rate and rhythm.  Abdominal  exam nontender or distended. No masses palpated. Extremities show no edema. neuro grossly intact  ECG-Sinus rhythm at a rate of 65. First-degree AV block. No ST changes.  A/P  1 Hyperlipidemia-continue statin. Recent LDL reviewed and 28. Liver functions normal.   2 Hypertension-blood pressure controlled. Continue present medications.  3 coronary artery disease-continue aspirin and statin.  4 Cerebrovascular disease-continue aspirin and statin.  Kirk Ruths, MD

## 2015-12-06 ENCOUNTER — Ambulatory Visit (INDEPENDENT_AMBULATORY_CARE_PROVIDER_SITE_OTHER): Payer: Medicare Other | Admitting: Cardiology

## 2015-12-06 ENCOUNTER — Encounter: Payer: Self-pay | Admitting: Cardiology

## 2015-12-06 ENCOUNTER — Ambulatory Visit (INDEPENDENT_AMBULATORY_CARE_PROVIDER_SITE_OTHER): Payer: Medicare Other | Admitting: Behavioral Health

## 2015-12-06 VITALS — BP 127/63 | HR 65 | Ht 65.0 in | Wt 174.1 lb

## 2015-12-06 DIAGNOSIS — I10 Essential (primary) hypertension: Secondary | ICD-10-CM

## 2015-12-06 DIAGNOSIS — E785 Hyperlipidemia, unspecified: Secondary | ICD-10-CM

## 2015-12-06 DIAGNOSIS — I2510 Atherosclerotic heart disease of native coronary artery without angina pectoris: Secondary | ICD-10-CM | POA: Diagnosis not present

## 2015-12-06 DIAGNOSIS — I6523 Occlusion and stenosis of bilateral carotid arteries: Secondary | ICD-10-CM | POA: Diagnosis not present

## 2015-12-06 DIAGNOSIS — Z23 Encounter for immunization: Secondary | ICD-10-CM | POA: Diagnosis not present

## 2015-12-06 NOTE — Patient Instructions (Signed)
Your physician wants you to follow-up in: ONE YEAR WITH DR CRENSHAW You will receive a reminder letter in the mail two months in advance. If you don't receive a letter, please call our office to schedule the follow-up appointment.   If you need a refill on your cardiac medications before your next appointment, please call your pharmacy.  

## 2015-12-06 NOTE — Progress Notes (Addendum)
Pre visit review using our clinic review tool, if applicable. No additional management support is needed unless otherwise documented below in the visit note.  Patient in clinic today for Influenza vaccination. IM given in Left Deltoid. Patient tolerated injection well.

## 2015-12-24 ENCOUNTER — Other Ambulatory Visit: Payer: Self-pay | Admitting: Cardiology

## 2015-12-24 ENCOUNTER — Other Ambulatory Visit: Payer: Self-pay | Admitting: Family Medicine

## 2015-12-24 DIAGNOSIS — I2510 Atherosclerotic heart disease of native coronary artery without angina pectoris: Secondary | ICD-10-CM

## 2015-12-25 ENCOUNTER — Other Ambulatory Visit: Payer: Self-pay | Admitting: Cardiology

## 2015-12-25 DIAGNOSIS — I2510 Atherosclerotic heart disease of native coronary artery without angina pectoris: Secondary | ICD-10-CM

## 2016-01-05 DIAGNOSIS — H26493 Other secondary cataract, bilateral: Secondary | ICD-10-CM | POA: Diagnosis not present

## 2016-02-05 ENCOUNTER — Encounter: Payer: Self-pay | Admitting: Family

## 2016-02-05 ENCOUNTER — Ambulatory Visit (INDEPENDENT_AMBULATORY_CARE_PROVIDER_SITE_OTHER): Payer: Medicare Other | Admitting: Family

## 2016-02-05 DIAGNOSIS — R058 Other specified cough: Secondary | ICD-10-CM

## 2016-02-05 DIAGNOSIS — R05 Cough: Secondary | ICD-10-CM

## 2016-02-05 DIAGNOSIS — I6523 Occlusion and stenosis of bilateral carotid arteries: Secondary | ICD-10-CM

## 2016-02-05 MED ORDER — CETIRIZINE HCL 10 MG PO TABS: 10.0000 mg | ORAL_TABLET | Freq: Every day | ORAL | 11 refills | Status: DC

## 2016-02-05 MED ORDER — BECLOMETHASONE DIPROPIONATE 80 MCG/ACT NA AERS: 2.0000 | INHALATION_SPRAY | Freq: Every day | NASAL | 2 refills | Status: DC

## 2016-02-05 NOTE — Patient Instructions (Signed)
Please begin Qnasl spray.   Begin Zyrtec 10mg  once daily (OTC). Call if new/worsening symptoms or if not improved in 1 week.

## 2016-02-05 NOTE — Progress Notes (Signed)
Subjective:    Patient ID: Anthony [name], male    DOB: 14-Feb-1946, 70 y.o.   MRN: [id number]  HPI  Mr. Blache is a 70 yr old male who presents today with chief complaint of cough. Reports "tickle" in his throat x 3 weeks which has been making him cough. Reports that he is using otc nasal spray and zantac without improvement. Reports that he gets this "every year."  Reports that he has seen an allergist in the past and was told that he has a dust allergy.  Reports that he changes the filters regularly in his house.  Notes that he does have some reflux symptoms but does not feel that his cough is related to reflux because "why would it only happen in the winter."      Review of Systems See HPI  Past Medical History:  Diagnosis Date  . CAD (coronary artery disease) March 2011   with a non-ST elevation myocardial infarction. cardiac catherization whic revealed-left main was normal. The LAD had proximal calcification. There was long proximal 30% stenosis. Circumflex inthe AV groove had  proximal  60% stenosis at a mid obtuse marginal. This first large mid obtuse marginal had ostial 70% stenosis. The right coronary artery had a proximal long 50% stenosis.   . Cataract   . External hemorrhoids    large external  . GERD (gastroesophageal reflux disease)   . Hyperlipidemia   . Hypertension    8-10 years  . Kidney stone   . Kidney stones 07/20/2012  . Personal history of colonic polyps 04/05/2013     Social History   Social History  . Marital status: Married    Spouse name: N/A  . Number of children: N/A  . Years of education: N/A   Occupational History  . Not on file.   Social History Main Topics  . Smoking status: Former Smoker    Quit date: 03/12/2007  . Smokeless tobacco: Never Used     Comment: 40 pack year history  . Alcohol use No  . Drug use: No  . 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 Not on file   Social History Narrative   married -5 yrs  [name] [place])   grew up in [place]   He has one son - two grandaugthers     He is retired.  He had     a 40 pack-year smoking history but has discontinued.     prev occuptation -  [employer]   Alcohol use-no   Smoking Status:  quit   Packs/Day:  0.5   Caffeine use/day:  2 beverages daily   Does Patient Exercise:  yes             Past Surgical History:  Procedure Laterality Date  . CATARACT EXTRACTION Bilateral 2013  . COLONOSCOPY  2009   [facility], 2015- x4 polyps removed  . CORONARY ANGIOPLASTY WITH STENT PLACEMENT  2011  . CYSTOSCOPY W/ RETROGRADES Bilateral 09/29/2013   Procedure: CYSTOSCOPY WITH RETROGRADE PYELOGRAM;  Surgeon: Alexis Frock, MD;  Location: [facility];  Service: Urology;  Laterality: Bilateral;  . CYSTOSCOPY WITH RETROGRADE PYELOGRAM, URETEROSCOPY AND STENT PLACEMENT N/A 06/14/2015   Procedure: CYSTOSCOPY WITH RETROGRADE PYELOGRAM, cysto litholapexy ;  Surgeon: Alexis Frock, MD;  Location: [facility];  Service: Urology;  Laterality: N/A;  . HOLMIUM LASER APPLICATION N/A 0000000  Procedure: HOLMIUM LASER APPLICATION bladder stone;  Surgeon: Alexis Frock, MD;  Location: [facility];  Service: Urology;  Laterality: N/A;  . NEPHROLITHOTOMY Left 09/29/2013   Procedure: NEPHROLITHOTOMY PERCUTANEOUS WITH ACCESS;  Surgeon: Alexis Frock, MD;  [facility];  Service: Urology;  Laterality: Left;  . URETEROSCOPY N/A 09/29/2013   Procedure: URETEROSCOPY;  Surgeon: Alexis Frock, MD;  [facility];  Service: Urology;  Laterality: N/A;    Family History  Problem Relation Age of Onset  . Coronary artery disease Father     in his 20's  . Hyperlipidemia Father   . Hypertension Father   . Hearing loss Father   . Heart disease Mother     deceased of heart disease  . Hypertension Mother   . Hyperlipidemia Mother   . Other Neg Hx     no lung  cancer, colon cancer, or prostate  . Colon cancer Neg Hx   . Multiple sclerosis Son   . Heart disease Maternal Grandmother   . Heart disease Maternal Grandfather   . Heart disease Paternal Grandmother   . Heart disease Paternal Grandfather   . GER disease Sister     No Known Allergies  Current Outpatient Prescriptions on File Prior to Visit  Medication Sig Dispense Refill  . amLODipine (NORVASC) 10 MG tablet TAKE 1 TABLET BY MOUTH  EVERY MORNING 90 tablet 0  . aspirin 81 MG tablet Take 81 mg by mouth daily.     Marland Kitchen losartan (COZAAR) 100 MG tablet TAKE 1 TABLET BY MOUTH  EVERY MORNING 90 tablet 0  . metoprolol tartrate (LOPRESSOR) 25 MG tablet TAKE ONE-HALF TABLET BY  MOUTH TWICE A DAY 90 tablet 1  . Multiple Vitamins-Minerals (ICAPS PO) Take by mouth daily.    . potassium citrate (UROCIT-K) 10 MEQ (1080 MG) SR tablet TK 1 T PO TID  3  . rosuvastatin (CRESTOR) 40 MG tablet TAKE 1 TABLET BY MOUTH  DAILY 90 tablet 1  . vitamin C (ASCORBIC ACID) 500 MG tablet Take 500 mg by mouth daily.     No current facility-administered medications on file prior to visit.     BP 138/61 (BP Location: Right Arm, Cuff Size: Normal)   Pulse 71   Temp 97.9 F (36.6 C) (Oral)   Resp 20   Ht 5' (1.524 m)   Wt 176 lb (79.8 kg)   SpO2 97% Comment: room air  BMI 34.37 kg/m       Objective:   Physical Exam  Constitutional: He is oriented to person, place, and time. He appears well-developed and well-nourished. No distress.  HENT:  Head: Normocephalic and atraumatic.  Right Ear: Tympanic membrane and ear canal normal.  Left Ear: Tympanic membrane and ear canal normal.  Mouth/Throat: No oropharyngeal exudate, posterior oropharyngeal edema or posterior oropharyngeal erythema.  Cardiovascular: Normal rate and regular rhythm.   No murmur heard. Pulmonary/Chest: Effort normal and breath sounds normal. No respiratory distress. He has no wheezes. He has no rales.  Musculoskeletal: He exhibits no edema.    Neurological: He is alert and oriented to person, place, and time.  Skin: Skin is warm and dry.  Psychiatric: He has a normal mood and affect. His behavior is normal. Thought content normal.          Assessment & Plan:

## 2016-02-05 NOTE — Progress Notes (Signed)
Pre visit review using our clinic review tool, if applicable. No additional management support is needed unless otherwise documented below in the visit note. 

## 2016-02-05 NOTE — Assessment & Plan Note (Signed)
He reports good response in the past to Qnasl. Will rx Qnasl.  Also add zyrtec 10mg  once daily. Advised pt to let me know if new/worsening symptoms or if symptoms are not improved in 1 week. Pt verbalizes understanding.

## 2016-02-06 ENCOUNTER — Telehealth: Payer: Self-pay | Admitting: *Deleted

## 2016-02-06 MED ORDER — FLUTICASONE PROPIONATE 50 MCG/ACT NA SUSP: 2.0000 | Freq: Every day | NASAL | 6 refills | Status: DC

## 2016-02-06 NOTE — Telephone Encounter (Signed)
rx sent for flonase instead.

## 2016-02-06 NOTE — Telephone Encounter (Signed)
Received fax from [facility] that QNASL is not covered under insurance plan. Spoke with Museum/gallery conservator at [phone] [employer]. Ins ID: [id number]. Plan covers:  Fluticasone, flunisolide, azelastine and triamcinolone. Pt had already tried fluticasone. Please advise if one of the other nasal sprays would be an appropriate alternative?

## 2016-02-06 NOTE — Telephone Encounter (Signed)
Per verbal from PCP, she told pt yesterday that if [facility] could not get approved that we would send Rx for Flonase. Notified pt and he voices understanding.

## 2016-02-16 DIAGNOSIS — Z961 Presence of intraocular lens: Secondary | ICD-10-CM | POA: Diagnosis not present

## 2016-02-16 DIAGNOSIS — H26492 Other secondary cataract, left eye: Secondary | ICD-10-CM | POA: Diagnosis not present

## 2016-02-23 ENCOUNTER — Encounter: Payer: Self-pay | Admitting: *Deleted

## 2016-03-10 ENCOUNTER — Other Ambulatory Visit: Payer: Self-pay | Admitting: Family Medicine

## 2016-04-05 IMAGING — CT CT ABD-PELV W/O CM
2 of 4 series · 16 of 46 positions shown, 18 images · non-contrast
Comparison: 07/20/2012

CLINICAL DATA: Left flank pain.  Hematuria.

EXAM:
CT ABDOMEN AND PELVIS WITHOUT CONTRAST
TECHNIQUE: Multidetector CT imaging of the abdomen and pelvis was performed
following the standard protocol without IV contrast.

[Series 2: renal stone > 200 lbs 5.0 b31f · axial · 0.90mm/px · z∈[-526,-116]mm · 13 of 90 slices shown, 15 images]
[im 4/90  soft-tissue]
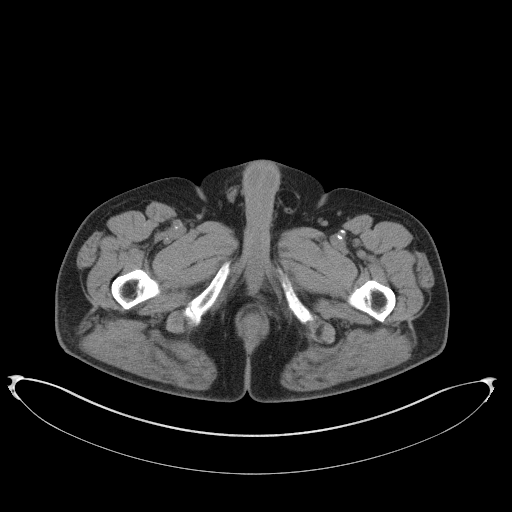
[im 4/90  bone]
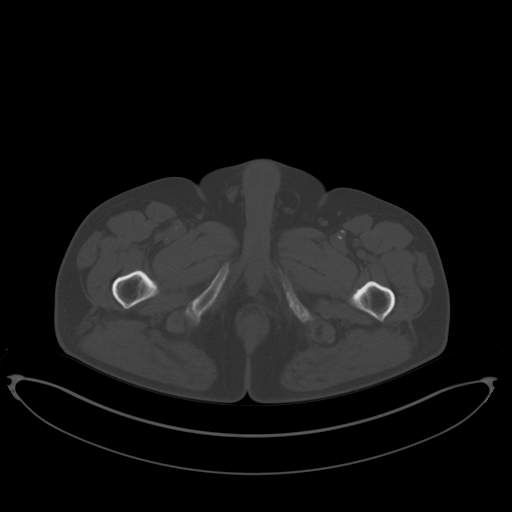
[im 12/90  soft-tissue]
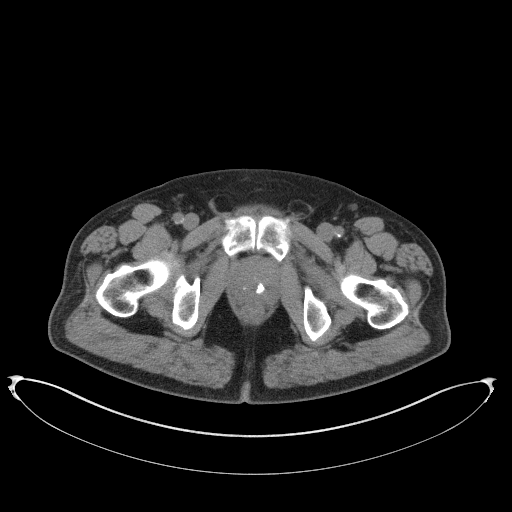
[im 19/90  soft-tissue]
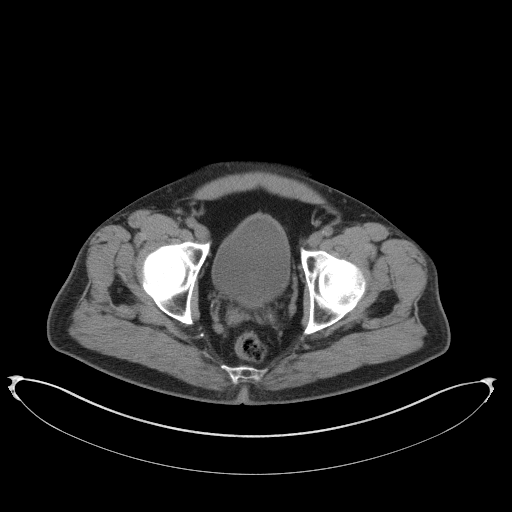
[im 26/90  soft-tissue]
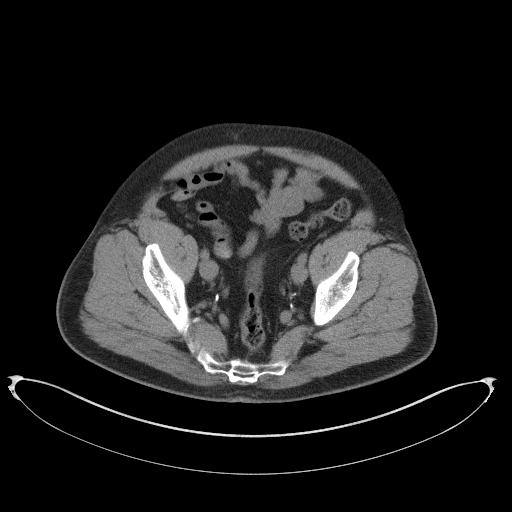
[im 30/90  soft-tissue]
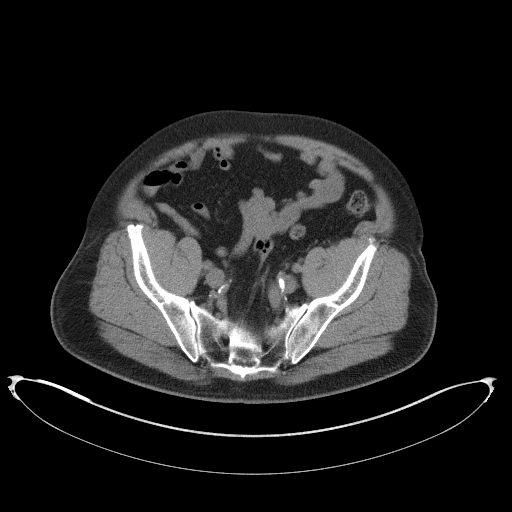
[im 38/90  soft-tissue]
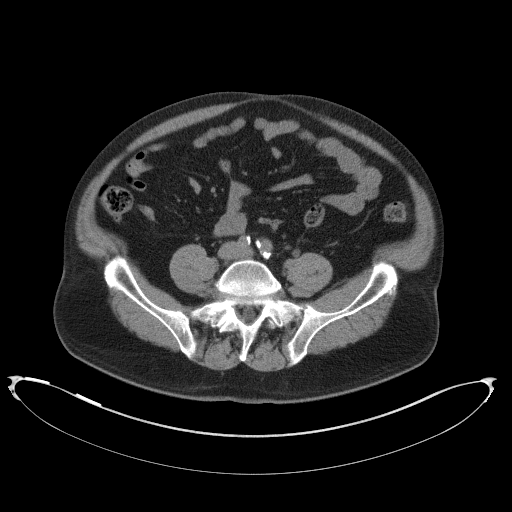
[im 45/90  soft-tissue]
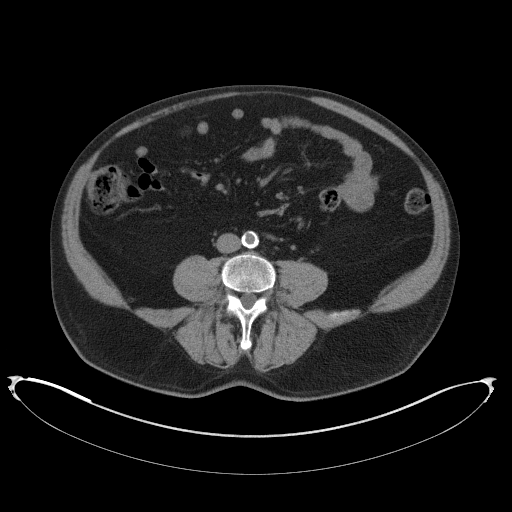
[im 52/90  soft-tissue]
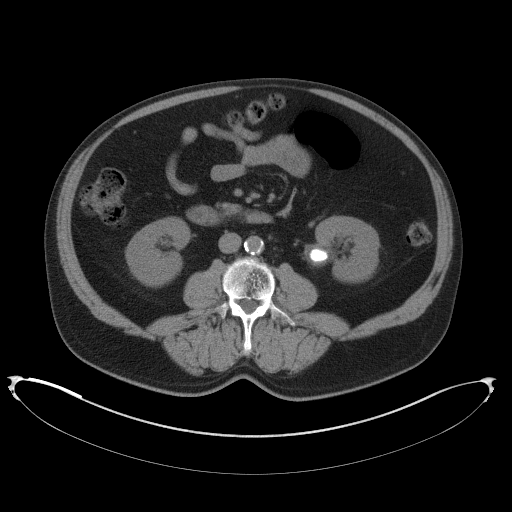
[im 60/90  soft-tissue]
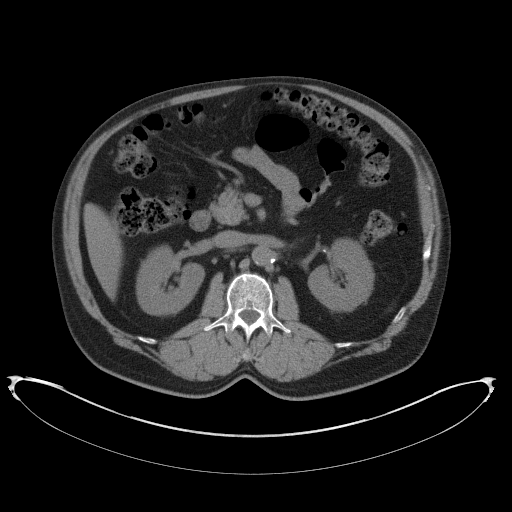
[im 60/90  bone]
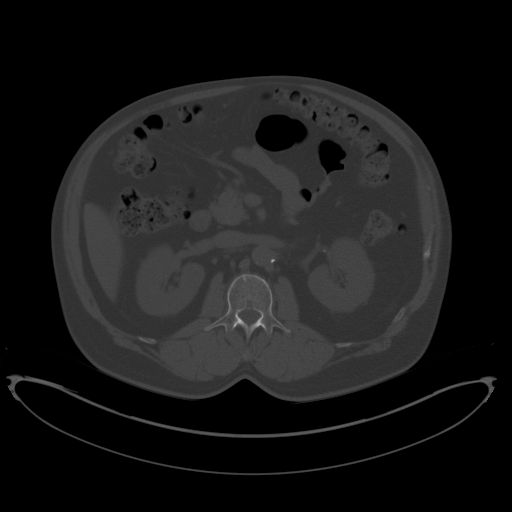
[im 64/90  soft-tissue]
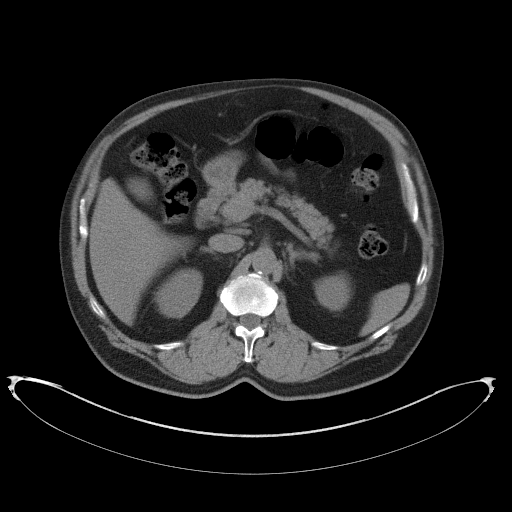
[im 71/90  soft-tissue]
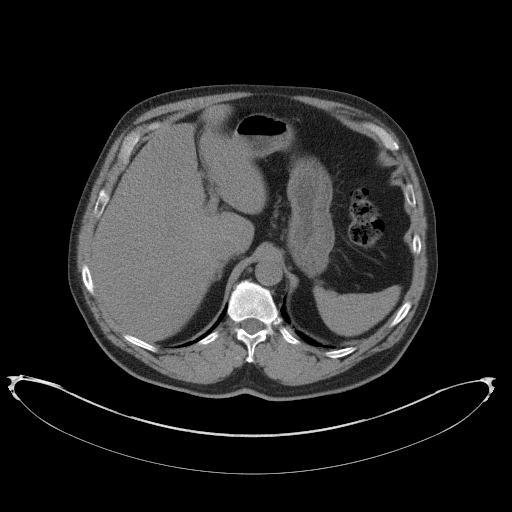
[im 78/90  soft-tissue]
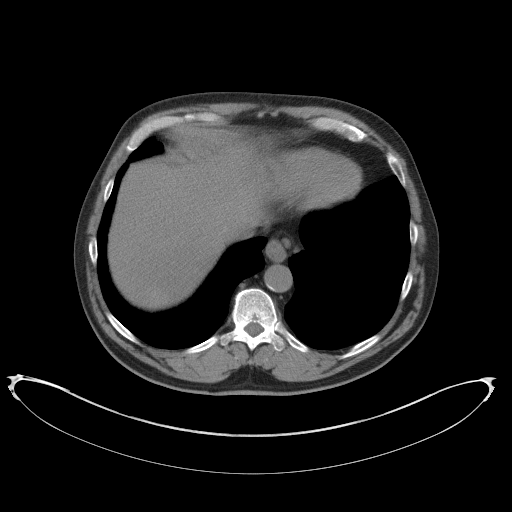
[im 86/90  soft-tissue]
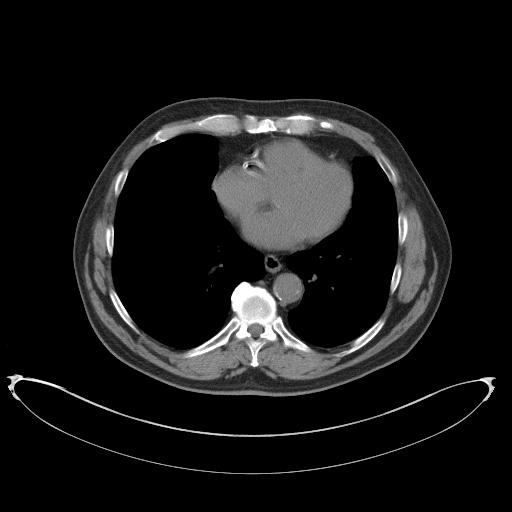

[Series 5: renal stone 3.0 coronal · coronal · 1.01mm/px · 3 of 94 slices shown]
[im 32/94  soft-tissue]
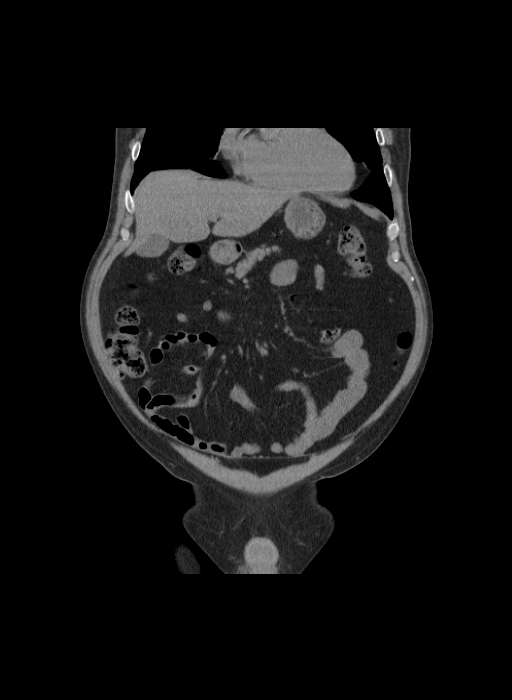
[im 42/94  soft-tissue]
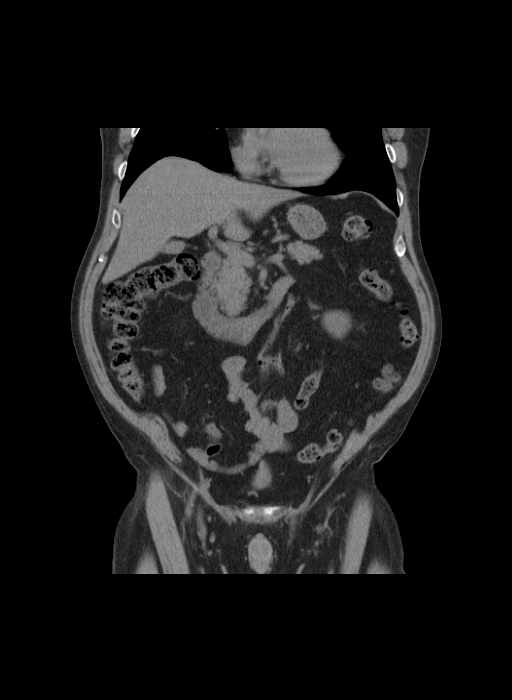
[im 52/94  soft-tissue]
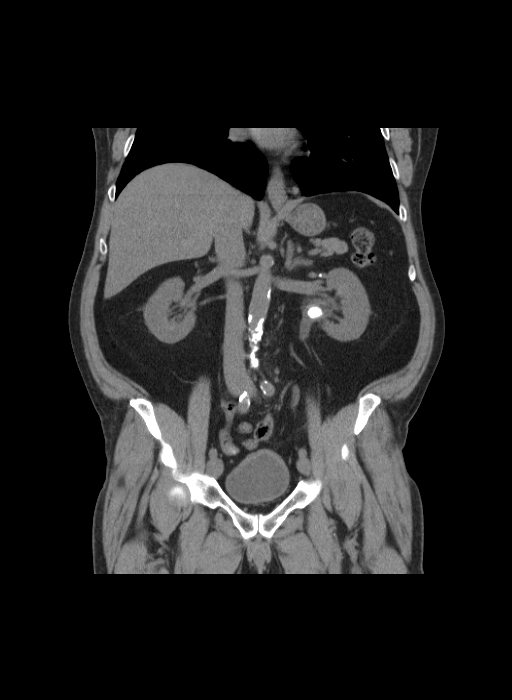

[16 of 46 positions shown; findings below may reference images not displayed]

FINDINGS: 11 x 16 mm calculus again seen within the left renal pelvis. Mild
left pelviectasis is stable without significant caliectasis or
perinephric stranding. No evidence of ureteral calculi or
dilatation. Tiny 2 mm calculus again noted in upper pole of right
kidney. No evidence of right-sided hydronephrosis. No bladder
calculi identified. Mildly enlarged prostate gland appears stable.

Noncontrast images of the liver, gallbladder, pancreas, spleen, and
adrenal glands are unremarkable in appearance. No soft tissue masses
or lymphadenopathy identified. No evidence of inflammatory process
or abnormal fluid collections. No evidence of bowel wall thickening
or dilatation. Incidentally noted are bilateral L5 pars defects
without associated spondylolisthesis.
IMPRESSION: 16 mm calculus persists in the left renal pelvis with stable mild
pelviectasis. No associated caliectasis or other acute findings.

Nonobstructive right nephrolithiasis.

Stable mildly enlarged prostate.

Bilateral L5 pars defects without associated spondylolisthesis.

## 2016-04-08 ENCOUNTER — Encounter: Payer: Self-pay | Admitting: Internal Medicine

## 2016-04-16 ENCOUNTER — Encounter: Payer: Self-pay | Admitting: Internal Medicine

## 2016-04-16 ENCOUNTER — Ambulatory Visit (INDEPENDENT_AMBULATORY_CARE_PROVIDER_SITE_OTHER): Payer: Medicare Other | Admitting: Family Medicine

## 2016-04-16 ENCOUNTER — Encounter: Payer: Self-pay | Admitting: Family Medicine

## 2016-04-16 VITALS — BP 128/62 | HR 74 | Temp 97.7°F | Resp 16 | Ht 60.0 in | Wt 181.4 lb

## 2016-04-16 DIAGNOSIS — Z86010 Personal history of colonic polyps: Secondary | ICD-10-CM

## 2016-04-16 DIAGNOSIS — R739 Hyperglycemia, unspecified: Secondary | ICD-10-CM

## 2016-04-16 DIAGNOSIS — E782 Mixed hyperlipidemia: Secondary | ICD-10-CM

## 2016-04-16 DIAGNOSIS — K219 Gastro-esophageal reflux disease without esophagitis: Secondary | ICD-10-CM

## 2016-04-16 DIAGNOSIS — Z0000 Encounter for general adult medical examination without abnormal findings: Secondary | ICD-10-CM

## 2016-04-16 DIAGNOSIS — I10 Essential (primary) hypertension: Secondary | ICD-10-CM | POA: Diagnosis not present

## 2016-04-16 DIAGNOSIS — L989 Disorder of the skin and subcutaneous tissue, unspecified: Secondary | ICD-10-CM

## 2016-04-16 DIAGNOSIS — Z1211 Encounter for screening for malignant neoplasm of colon: Secondary | ICD-10-CM

## 2016-04-16 DIAGNOSIS — D126 Benign neoplasm of colon, unspecified: Secondary | ICD-10-CM

## 2016-04-16 DIAGNOSIS — N200 Calculus of kidney: Secondary | ICD-10-CM

## 2016-04-16 LAB — COMPREHENSIVE METABOLIC PANEL
ALBUMIN: 4.2 g/dL (ref 3.5–5.2)
ALK PHOS: 65 U/L (ref 39–117)
ALT: 18 U/L (ref 0–53)
AST: 16 U/L (ref 0–37)
BUN: 15 mg/dL (ref 6–23)
CHLORIDE: 105 meq/L (ref 96–112)
CO2: 28 mEq/L (ref 19–32)
Calcium: 9 mg/dL (ref 8.4–10.5)
Creatinine, Ser: 0.81 mg/dL (ref 0.40–1.50)
GFR: 99.9 mL/min (ref >60.00)
Glucose, Bld: 95 mg/dL (ref 70–99)
POTASSIUM: 3.5 meq/L (ref 3.5–5.1)
SODIUM: 139 meq/L (ref 135–145)
TOTAL PROTEIN: 7.4 g/dL (ref 6.0–8.3)
Total Bilirubin: 0.7 mg/dL (ref 0.2–1.2)

## 2016-04-16 LAB — CBC
HEMATOCRIT: 42.7 % (ref 39.0–52.0)
HEMOGLOBIN: 14.4 g/dL (ref 13.0–17.0)
MCHC: 33.8 g/dL (ref 30.0–36.0)
MCV: 93.5 fl (ref 78.0–100.0)
Platelets: 228 10*3/uL (ref 150.0–400.0)
RBC: 4.56 Mil/uL (ref 4.22–5.81)
RDW: 13.2 % (ref 11.5–15.5)
WBC: 8 10*3/uL (ref 4.0–10.5)

## 2016-04-16 LAB — TSH: TSH: 1.68 u[IU]/mL (ref 0.35–4.50)

## 2016-04-16 LAB — LIPID PANEL
CHOLESTEROL: 100 mg/dL (ref 0–200)
HDL: 37.1 mg/dL — AB (ref >39.00)
LDL Cholesterol: 32 mg/dL (ref 0–99)
NONHDL: 62.5
TRIGLYCERIDES: 151 mg/dL — AB (ref 0.0–149.0)
Total CHOL/HDL Ratio: 3
VLDL: 30.2 mg/dL (ref 0.0–40.0)

## 2016-04-16 LAB — HEMOGLOBIN A1C: Hgb A1c MFr Bld: 6.1 % (ref 4.6–6.5)

## 2016-04-16 NOTE — Assessment & Plan Note (Signed)
Encouraged heart healthy diet, increase exercise, avoid trans fats, consider a krill oil cap daily 

## 2016-04-16 NOTE — Assessment & Plan Note (Signed)
Follows with Dr Tammi Klippel with urology annually

## 2016-04-16 NOTE — Assessment & Plan Note (Signed)
Tubular Adenomas on colonoscopy 2015 with Dr Hilarie Fredrickson. Recall letter at 3 years just sent. Will arrange next appt today

## 2016-04-16 NOTE — Progress Notes (Signed)
Pre visit review using our clinic review tool, if applicable. No additional management support is needed unless otherwise documented below in the visit note. 

## 2016-04-16 NOTE — Assessment & Plan Note (Signed)
minimize simple carbs. Increase exercise as tolerated.  

## 2016-04-16 NOTE — Assessment & Plan Note (Addendum)
Avoid offending foods, start probiotics. Do not eat large meals in late evening and consider raising head of bed. Tomato products causes more symptoms than anything else. Use meds prn infrequently

## 2016-04-16 NOTE — Assessment & Plan Note (Signed)
Patient denies any difficulties at home. No trouble with ADLs, depression or falls. See EMR for functional status screen and depression screen. No recent changes to vision or hearing. Is UTD with immunizations. Is UTD with screening. Discussed Advanced Directives. Encouraged heart healthy diet, exercise as tolerated and adequate sleep. See patient's problem list for health risk factors to monitor. See AVS for preventative healthcare recommendation schedule. Referred to gastroenterology and dermatology for ongoing care Continues to see Dr Stanford Breed of cardiology  Continues to see Dr Tammi Klippel of urology

## 2016-04-16 NOTE — Patient Instructions (Signed)
NOW probiotic, 10 strains 1 cap po daily.  Preventive Care 71 Years and Older, Male Preventive care refers to lifestyle choices and visits with your health care provider that can promote health and wellness. What does preventive care include?  A yearly physical exam. This is also called an annual well check.  Dental exams once or twice a year.  Routine eye exams. Ask your health care provider how often you should have your eyes checked.  Personal lifestyle choices, including:  Daily care of your teeth and gums.  Regular physical activity.  Eating a healthy diet.  Avoiding tobacco and drug use.  Limiting alcohol use.  Practicing safe sex.  Taking low-dose aspirin every day.  Taking vitamin and mineral supplements as recommended by your health care provider. What happens during an annual well check? The services and screenings done by your health care provider during your annual well check will depend on your age, overall health, lifestyle risk factors, and family history of disease. Counseling  Your health care provider may ask you questions about your:  Alcohol use.  Tobacco use.  Drug use.  Emotional well-being.  Home and relationship well-being.  Sexual activity.  Eating habits.  History of falls.  Memory and ability to understand (cognition).  Work and work Statistician.  Reproductive health. Screening  You may have the following tests or measurements:  Height, weight, and BMI.  Blood pressure.  Lipid and cholesterol levels. These may be checked every 5 years, or more frequently if you are over 15 years old.  Skin check.  Lung cancer screening. You may have this screening every year starting at age 68 if you have a 30-pack-year history of smoking and currently smoke or have quit within the past 15 years.  Fecal occult blood test (FOBT) of the stool. You may have this test every year starting at age 25.  Flexible sigmoidoscopy or colonoscopy.  You may have a sigmoidoscopy every 5 years or a colonoscopy every 10 years starting at age 88.  Hepatitis C blood test.  Hepatitis B blood test.  Sexually transmitted disease (STD) testing.  Diabetes screening. This is done by checking your blood sugar (glucose) after you have not eaten for a while (fasting). You may have this done every 1-3 years.  Bone density scan. This is done to screen for osteoporosis. You may have this done starting at age 21.  Mammogram. This may be done every 1-2 years. Talk to your health care provider about how often you should have regular mammograms. Talk with your health care provider about your test results, treatment options, and if necessary, the need for more tests. Vaccines  Your health care provider may recommend certain vaccines, such as:  Influenza vaccine. This is recommended every year.  Tetanus, diphtheria, and acellular pertussis (Tdap, Td) vaccine. You may need a Td booster every 10 years.  Varicella vaccine. You may need this if you have not been vaccinated.  Zoster vaccine. You may need this after age 78.  Measles, mumps, and rubella (MMR) vaccine. You may need at least one dose of MMR if you were born in 1957 or later. You may also need a second dose.  Pneumococcal 13-valent conjugate (PCV13) vaccine. One dose is recommended after age 57.  Pneumococcal polysaccharide (PPSV23) vaccine. One dose is recommended after age 18.  Meningococcal vaccine. You may need this if you have certain conditions.  Hepatitis A vaccine. You may need this if you have certain conditions or if you travel or  work in places where you may be exposed to hepatitis A.  Hepatitis B vaccine. You may need this if you have certain conditions or if you travel or work in places where you may be exposed to hepatitis B.  Haemophilus influenzae type b (Hib) vaccine. You may need this if you have certain conditions. Talk to your health care provider about which screenings  and vaccines you need and how often you need them. This information is not intended to replace advice given to you by your health care provider. Make sure you discuss any questions you have with your health care provider. Document Released: 03/24/2015 Document Revised: 11/15/2015 Document Reviewed: 12/27/2014 Elsevier Interactive Patient Education  2017 [employer].

## 2016-04-16 NOTE — Assessment & Plan Note (Signed)
Firm, scaly lesion on scalp, sees [place] [facility], referred back to them for further evaluation

## 2016-04-16 NOTE — Progress Notes (Signed)
Subjective:    Patient ID: Anthony [name], male    DOB: Sep 07, 1945, 71 y.o.   MRN: [id number]   I acted as a Education administrator for Dr. Ross Ludwig, LPN    Chief Complaint  Patient presents with  . Annual Exam  . Hypertension  . Hyperlipidemia  . Hyperglycemia    Hypertension  This is a chronic problem. The current episode started more than 1 year ago. The problem is unchanged. The problem is controlled. Pertinent negatives include no blurred vision, chest pain, headaches, malaise/fatigue, palpitations or shortness of breath.  Hyperlipidemia  This is a chronic problem. The current episode started more than 1 year ago. The problem is controlled. Pertinent negatives include no chest pain or shortness of breath.  Hyperglycemia  This is a chronic problem. The current episode started more than 1 year ago. The problem has been unchanged. Pertinent negatives include no chest pain, congestion, coughing, fever, headaches, rash or vomiting.    Patient is in today for annual wellness exam and follow up on hyplipidemia, hyperglycemia, hypertension and other medical concerns.. Patient has no acute concerns at this time. No recent hospitalizations or acute febrile illness. Is staying active and maintains a heart healthy diet on a good day. No trouble with ADLs at home. Denies CP/palp/SOB/HA/congestion/fevers/GI or GU c/o. Taking meds as prescribed  Past Medical History:  Diagnosis Date  . CAD (coronary artery disease) March 2011   with a non-ST elevation myocardial infarction. cardiac catherization whic revealed-left main was normal. The LAD had proximal calcification. There was long proximal 30% stenosis. Circumflex inthe AV groove had  proximal  60% stenosis at a mid obtuse marginal. This first large mid obtuse marginal had ostial 70% stenosis. The right coronary artery had a proximal long 50% stenosis.   . Cataract   . External hemorrhoids    large external  . GERD (gastroesophageal reflux disease)     . Hyperlipidemia   . Hypertension    8-10 years  . Kidney stone   . Kidney stones 07/20/2012  . Medicare annual wellness visit, subsequent 02/12/2012   Follows with dermatology, Dr Maisie Fus with cardiology, Dr Rance Muir with gastroenterology, Dr Hilarie Fredrickson   . Personal history of colonic polyps 04/05/2013    Past Surgical History:  Procedure Laterality Date  . CATARACT EXTRACTION Bilateral 2013  . COLONOSCOPY  2009   polyp-[facility], 2015- x4 polyps removed  . CORONARY ANGIOPLASTY WITH STENT PLACEMENT  2011  . CYSTOSCOPY W/ RETROGRADES Bilateral 09/29/2013   Procedure: CYSTOSCOPY WITH RETROGRADE PYELOGRAM;  Surgeon: Alexis Frock, MD;  Location: [facility];  Service: Urology;  Laterality: Bilateral;  . CYSTOSCOPY WITH RETROGRADE PYELOGRAM, URETEROSCOPY AND STENT PLACEMENT N/A 06/14/2015   Procedure: CYSTOSCOPY WITH RETROGRADE PYELOGRAM, cysto litholapexy ;  Surgeon: Alexis Frock, MD;  Location: [facility];  Service: Urology;  Laterality: N/A;  . HOLMIUM LASER APPLICATION N/A 0000000   Procedure: HOLMIUM LASER APPLICATION bladder stone;  Surgeon: Alexis Frock, MD;  Location: [facility];  Service: Urology;  Laterality: N/A;  . NEPHROLITHOTOMY Left 09/29/2013   Procedure: NEPHROLITHOTOMY PERCUTANEOUS WITH ACCESS;  Surgeon: Alexis Frock, MD;  Location: [facility];  Service: Urology;  Laterality: Left;  . URETEROSCOPY N/A 09/29/2013   Procedure: URETEROSCOPY;  Surgeon: Alexis Frock, MD;  Location: [facility];  Service: Urology;  Laterality: N/A;    Family History  Problem Relation Age of Onset  . Coronary artery disease Father     in his 21's  . Hyperlipidemia Father   .  Hypertension Father   . Hearing loss Father   . Heart disease Mother     deceased of heart disease  . Hypertension Mother   . Hyperlipidemia Mother   . Multiple sclerosis Son   . Heart disease Maternal Grandmother   . Heart disease Maternal Grandfather   . Heart disease Paternal  Grandmother   . Heart disease Paternal Grandfather   . GER disease Sister   . Other Neg Hx     no lung cancer, colon cancer, or prostate  . Colon cancer Neg Hx     Social History   Social History  . Marital status: Married    Spouse name: N/A  . Number of children: N/A  . Years of education: N/A   Occupational History  . Not on file.   Social History Main Topics  . Smoking status: Former Smoker    Quit date: 03/12/2007  . Smokeless tobacco: Never Used     Comment: 40 pack year history  . Alcohol use No  . Drug use: No  . 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 Not on file   Social History Narrative   married -37 yrs  [name] [place])   grew up in [place]   He has one son - two grandaugthers     He is retired.  He had     a 40 pack-year smoking history but has discontinued.     prev occuptation -  [employer]   Alcohol use-no   Smoking Status:  quit   Packs/Day:  0.5   Caffeine use/day:  2 beverages daily   Does Patient Exercise:  yes             Outpatient Medications Prior to Visit  Medication Sig Dispense Refill  . amLODipine (NORVASC) 10 MG tablet TAKE 1 TABLET BY MOUTH  EVERY MORNING 90 tablet 0  . aspirin 81 MG tablet Take 81 mg by mouth daily.     Marland Kitchen losartan (COZAAR) 100 MG tablet TAKE 1 TABLET BY MOUTH  EVERY MORNING 90 tablet 0  . metoprolol tartrate (LOPRESSOR) 25 MG tablet TAKE ONE-HALF TABLET BY  MOUTH TWICE A DAY 90 tablet 1  . Multiple Vitamins-Minerals (ICAPS PO) Take by mouth daily.    . potassium citrate (UROCIT-K) 10 MEQ (1080 MG) SR tablet TK 1 T PO TID  3  . rosuvastatin (CRESTOR) 40 MG tablet TAKE 1 TABLET BY MOUTH  DAILY 90 tablet 1  . vitamin C (ASCORBIC ACID) 500 MG tablet Take 500 mg by mouth daily.    . cetirizine (ZYRTEC) 10 MG tablet Take 1 tablet (10 mg total) by mouth daily. (Patient not taking: Reported on 04/16/2016) 30 tablet 11  .  fluticasone (FLONASE) 50 MCG/ACT nasal spray Place 2 sprays into both nostrils daily. (Patient not taking: Reported on 04/16/2016) 16 g 6   No facility-administered medications prior to visit.     No Known Allergies  Review of Systems  Constitutional: Negative for fever and malaise/fatigue.  HENT: Negative for congestion.   Eyes: Negative for blurred vision.  Respiratory: Negative for cough and shortness of breath.   Cardiovascular: Negative for chest pain, palpitations and leg swelling.  Gastrointestinal: Negative for vomiting.  Musculoskeletal: Negative for back pain.  Skin: Negative for rash.  Neurological: Negative for loss of consciousness and headaches.       Objective:  Physical Exam  Constitutional: He is oriented to person, place, and time. He appears well-developed and well-nourished. No distress.  HENT:  Head: Normocephalic and atraumatic.  Eyes: Conjunctivae are normal.  Neck: Normal range of motion. No thyromegaly present.  Cardiovascular: Normal rate and regular rhythm.   Pulmonary/Chest: Effort normal and breath sounds normal. He has no wheezes.  Abdominal: Soft. Bowel sounds are normal. There is no tenderness.  Musculoskeletal: Normal range of motion. He exhibits no edema or deformity.  Neurological: He is alert and oriented to person, place, and time.  Skin: Skin is warm and dry. He is not diaphoretic.  Psychiatric: He has a normal mood and affect.    BP 128/62 (BP Location: Left Arm, Patient Position: Sitting, Cuff Size: Normal)   Pulse 74   Temp 97.7 F (36.5 C) (Oral)   Resp 16   Ht 5' (1.524 m)   Wt 181 lb 6.4 oz (82.3 kg)   SpO2 98%   BMI 35.43 kg/m  Wt Readings from Last 3 Encounters:  04/16/16 181 lb 6.4 oz (82.3 kg)  02/05/16 176 lb (79.8 kg)  12/06/15 174 lb 1.3 oz (79 kg)     Lab Results  Component Value Date   WBC 8.4 10/12/2015   HGB 13.6 10/12/2015   HCT 39.2 10/12/2015   PLT 243.0 10/12/2015   GLUCOSE 81 10/12/2015   CHOL 94  10/12/2015   TRIG 152.0 (H) 10/12/2015   HDL 36.00 (L) 10/12/2015   LDLCALC 28 10/12/2015   ALT 15 10/12/2015   AST 14 10/12/2015   NA 139 10/12/2015   K 3.6 10/12/2015   CL 103 10/12/2015   CREATININE 0.78 10/12/2015   BUN 14 10/12/2015   CO2 28 10/12/2015   TSH 2.57 10/12/2015   PSA 3.32 03/24/2013   INR 1.05 05/25/2009   HGBA1C 6.1 04/14/2015    Lab Results  Component Value Date   TSH 2.57 10/12/2015   Lab Results  Component Value Date   WBC 8.4 10/12/2015   HGB 13.6 10/12/2015   HCT 39.2 10/12/2015   MCV 91.3 10/12/2015   PLT 243.0 10/12/2015   Lab Results  Component Value Date   NA 139 10/12/2015   K 3.6 10/12/2015   CO2 28 10/12/2015   GLUCOSE 81 10/12/2015   BUN 14 10/12/2015   CREATININE 0.78 10/12/2015   BILITOT 0.8 10/12/2015   ALKPHOS 74 10/12/2015   AST 14 10/12/2015   ALT 15 10/12/2015   PROT 7.6 10/12/2015   ALBUMIN 4.3 10/12/2015   CALCIUM 9.2 10/12/2015   ANIONGAP 12 09/30/2013   GFR 104.50 10/12/2015   Lab Results  Component Value Date   CHOL 94 10/12/2015   Lab Results  Component Value Date   HDL 36.00 (L) 10/12/2015   Lab Results  Component Value Date   LDLCALC 28 10/12/2015   Lab Results  Component Value Date   TRIG 152.0 (H) 10/12/2015   Lab Results  Component Value Date   CHOLHDL 3 10/12/2015   Lab Results  Component Value Date   HGBA1C 6.1 04/14/2015       Assessment & Plan:   Problem List Items Addressed This Visit    Hyperlipidemia, mixed    Encouraged heart healthy diet, increase exercise, avoid trans fats, consider a krill oil cap daily      Relevant Orders   Lipid panel   Essential hypertension    Well controlled, no changes to meds. Encouraged heart healthy diet such as the DASH diet  and exercise as tolerated.       Relevant Orders   CBC   Comprehensive metabolic panel   TSH   GERD    Avoid offending foods, start probiotics. Do not eat large meals in late evening and consider raising head of bed.  Tomato products causes more symptoms than anything else. Use meds prn infrequently      Health maintenance examination    Patient encouraged to maintain heart healthy diet, regular exercise, adequate sleep. Consider daily probiotics. Take medications as prescribed      Medicare annual wellness visit, subsequent    Patient denies any difficulties at home. No trouble with ADLs, depression or falls. See EMR for functional status screen and depression screen. No recent changes to vision or hearing. Is UTD with immunizations. Is UTD with screening. Discussed Advanced Directives. Encouraged heart healthy diet, exercise as tolerated and adequate sleep. See patient's problem list for health risk factors to monitor. See AVS for preventative healthcare recommendation schedule. Referred to gastroenterology and dermatology for ongoing care Continues to see Dr Stanford Breed of cardiology  Continues to see Dr Tammi Klippel of urology      Kidney stones    Follows with Dr Tammi Klippel with urology annually      Hyperglycemia     minimize simple carbs. Increase exercise as tolerated.       Relevant Orders   Hemoglobin A1c   History of colonic polyps    Tubular Adenomas on colonoscopy 2015 with Dr Hilarie Fredrickson. Recall letter at 3 years just sent. Will arrange next appt today      Skin lesion of scalp    Firm, scaly lesion on scalp, sees [place] [facility], referred back to them for further evaluation      Relevant Orders   Ambulatory referral to Dermatology    Other Visit Diagnoses    Tubular adenoma of colon    -  Primary   Relevant Orders   Ambulatory referral to Gastroenterology   Colon cancer screening       Relevant Orders   Ambulatory referral to Gastroenterology      I have discontinued Mr. Rafanan's cetirizine and fluticasone. I am also having him maintain his aspirin, Multiple Vitamins-Minerals (ICAPS PO), potassium citrate, vitamin C, rosuvastatin, metoprolol tartrate, losartan, and amLODipine.  No  orders of the defined types were placed in this encounter.   CMA served as Education administrator during this visit. History, Physical and Plan performed by medical provider. Documentation and orders reviewed and attested to.  Penni Homans, MDPatient ID: FAHIM FAIRCLOUGH, male   DOB: 1945/09/25, 71 y.o.   MRN: [id number]

## 2016-04-16 NOTE — Assessment & Plan Note (Signed)
Patient encouraged to maintain heart healthy diet, regular exercise, adequate sleep. Consider daily probiotics. Take medications as prescribed 

## 2016-04-16 NOTE — Assessment & Plan Note (Signed)
Well controlled, no changes to meds. Encouraged heart healthy diet such as the DASH diet and exercise as tolerated.  °

## 2016-04-26 DIAGNOSIS — L570 Actinic keratosis: Secondary | ICD-10-CM | POA: Diagnosis not present

## 2016-05-27 ENCOUNTER — Ambulatory Visit (AMBULATORY_SURGERY_CENTER): Payer: Self-pay

## 2016-05-27 VITALS — Ht 65.0 in | Wt 178.2 lb

## 2016-05-27 DIAGNOSIS — Z860100 Personal history of colon polyps, unspecified: Secondary | ICD-10-CM

## 2016-05-27 MED ORDER — SUPREP BOWEL PREP KIT 17.5-3.13-1.6 GM/177ML PO SOLN: 1.0000 | Freq: Once | ORAL | 0 refills | Status: AC

## 2016-05-27 NOTE — Progress Notes (Signed)
No allergies to eggs or soy No diet meds No home oxygen No past problems with anesthesia  Declined emmi 

## 2016-06-10 ENCOUNTER — Encounter: Payer: Self-pay | Admitting: Internal Medicine

## 2016-06-10 ENCOUNTER — Ambulatory Visit (AMBULATORY_SURGERY_CENTER): Payer: Medicare Other | Admitting: Internal Medicine

## 2016-06-10 ENCOUNTER — Telehealth: Payer: Self-pay

## 2016-06-10 VITALS — BP 115/73 | HR 68 | Temp 98.6°F | Resp 20 | Ht 65.0 in | Wt 178.0 lb

## 2016-06-10 DIAGNOSIS — D122 Benign neoplasm of ascending colon: Secondary | ICD-10-CM | POA: Diagnosis not present

## 2016-06-10 DIAGNOSIS — D123 Benign neoplasm of transverse colon: Secondary | ICD-10-CM | POA: Diagnosis not present

## 2016-06-10 DIAGNOSIS — D120 Benign neoplasm of cecum: Secondary | ICD-10-CM | POA: Diagnosis not present

## 2016-06-10 DIAGNOSIS — I2510 Atherosclerotic heart disease of native coronary artery without angina pectoris: Secondary | ICD-10-CM | POA: Diagnosis not present

## 2016-06-10 DIAGNOSIS — Z1211 Encounter for screening for malignant neoplasm of colon: Secondary | ICD-10-CM | POA: Diagnosis not present

## 2016-06-10 DIAGNOSIS — Z86010 Personal history of colonic polyps: Secondary | ICD-10-CM | POA: Diagnosis not present

## 2016-06-10 DIAGNOSIS — D125 Benign neoplasm of sigmoid colon: Secondary | ICD-10-CM | POA: Diagnosis not present

## 2016-06-10 DIAGNOSIS — D124 Benign neoplasm of descending colon: Secondary | ICD-10-CM

## 2016-06-10 DIAGNOSIS — I6529 Occlusion and stenosis of unspecified carotid artery: Secondary | ICD-10-CM | POA: Diagnosis not present

## 2016-06-10 DIAGNOSIS — I10 Essential (primary) hypertension: Secondary | ICD-10-CM | POA: Diagnosis not present

## 2016-06-10 MED ORDER — SODIUM CHLORIDE 0.9 % IV SOLN: 500.0000 mL | INTRAVENOUS | Status: DC

## 2016-06-10 NOTE — Progress Notes (Signed)
Called to room to assist during endoscopic procedure.  Patient ID and intended procedure confirmed with present staff. Received instructions for my participation in the procedure from the performing physician.  

## 2016-06-10 NOTE — Op Note (Signed)
[facility] Patient Name: Anthony [name] Procedure Date: 06/10/2016 9:15 AM MRN: [id number] Endoscopist: Jerene Bears , MD Age: 71 Referring MD:  Date of Birth: 11/12/1945 Gender: Male Account #: [id number] Procedure:                Colonoscopy Indications:              Surveillance: Personal history of adenomatous                            polyps on last colonoscopy 3 years ago Medicines:                Monitored Anesthesia Care Procedure:                Pre-Anesthesia Assessment:                           - Prior to the procedure, a History and Physical                            was performed, and patient medications and                            allergies were reviewed. The patient's tolerance of                            previous anesthesia was also reviewed. The risks                            and benefits of the procedure and the sedation                            options and risks were discussed with the patient.                            All questions were answered, and informed consent                            was obtained. Prior Anticoagulants: The patient has                            taken no previous anticoagulant or antiplatelet                            agents. ASA Grade Assessment: II - A patient with                            mild systemic disease. After reviewing the risks                            and benefits, the patient was deemed in                            satisfactory condition to undergo the procedure.  After obtaining informed consent, the colonoscope                            was passed under direct vision. Throughout the                            procedure, the patient's blood pressure, pulse, and                            oxygen saturations were monitored continuously. The                            Colonoscope was introduced through the anus and                            advanced to the the cecum,  identified by                            appendiceal orifice and ileocecal valve. The                            colonoscopy was performed without difficulty. The                            patient tolerated the procedure well. The quality                            of the bowel preparation was good. The ileocecal                            valve, appendiceal orifice, and rectum were                            photographed. Scope In: 9:27:30 AM Scope Out: 9:48:58 AM Scope Withdrawal Time: 0 hours 17 minutes 12 seconds  Total Procedure Duration: 0 hours 21 minutes 28 seconds  Findings:                 The perianal exam findings include non-thrombosed                            external hemorrhoids and non-thrombosed internal                            hemorrhoids with bleeding.                           A 5 mm polyp was found in the cecum. The polyp was                            sessile. The polyp was removed with a cold snare.                            Resection and retrieval were complete.  Two sessile polyps were found in the ascending                            colon. The polyps were 3 to 5 mm in size. These                            polyps were removed with a cold snare. Resection                            and retrieval were complete.                           A 8 mm polyp was found in the proximal transverse                            colon. The polyp was sessile. The polyp was removed                            with a cold snare. Resection and retrieval were                            complete.                           A 5 mm polyp was found in the distal transverse                            colon. The polyp was sessile. The polyp was removed                            with a cold snare. Resection and retrieval were                            complete.                           A 5 mm polyp was found in the sigmoid colon. The                             polyp was sessile. The polyp was removed with a                            cold snare. Resection and retrieval were complete.                           External and internal hemorrhoids were found during                            retroflexion, during perianal exam and during                            digital exam. The hemorrhoids were large. Complications:  No immediate complications. Estimated Blood Loss:     Estimated blood loss was minimal. Impression:               - One 5 mm polyp in the cecum, removed with a cold                            snare. Resected and retrieved.                           - Two 3 to 5 mm polyps in the ascending colon,                            removed with a cold snare. Resected and retrieved.                           - One 8 mm polyp in the proximal transverse colon,                            removed with a cold snare. Resected and retrieved.                           - One 5 mm polyp in the distal transverse colon,                            removed with a cold snare. Resected and retrieved.                           - One 5 mm polyp in the sigmoid colon, removed with                            a cold snare. Resected and retrieved.                           - External and internal hemorrhoids. Recommendation:           - Patient has a contact number available for                            emergencies. The signs and symptoms of potential                            delayed complications were discussed with the                            patient. Return to normal activities tomorrow.                            Written discharge instructions were provided to the                            patient.                           - Resume previous diet.                           -  Continue present medications.                           - Await pathology results.                           - Repeat colonoscopy is recommended for                             surveillance. The colonoscopy date will be                            determined after pathology results from today's                            exam become available for review. Jerene Bears, MD 06/10/2016 9:53:31 AM This report has been signed electronically.

## 2016-06-10 NOTE — Telephone Encounter (Signed)
-----   Message from Jerene Bears, MD sent at 06/10/2016 10:06 AM EDT ----- Regarding: [facility] Please refer to Dr. Johney Maine for external hemorrhoids Symptomatic and bleeding Thanks JMP

## 2016-06-10 NOTE — Telephone Encounter (Signed)
Records faxed to [facility] 

## 2016-06-10 NOTE — Progress Notes (Signed)
To recovery, report to Hines, RN, VSS 

## 2016-06-10 NOTE — Patient Instructions (Addendum)
YOU HAD AN ENDOSCOPIC PROCEDURE TODAY AT [facility]:   Refer to the procedure report that was given to you for any specific questions about what was found during the examination.  If the procedure report does not answer your questions, please call your gastroenterologist to clarify.  If you requested that your care partner not be given the details of your procedure findings, then the procedure report has been included in a sealed envelope for you to review at your convenience later.  YOU SHOULD EXPECT: Some feelings of bloating in the abdomen. Passage of more gas than usual.  Walking can help get rid of the air that was put into your GI tract during the procedure and reduce the bloating. If you had a lower endoscopy (such as a colonoscopy or flexible sigmoidoscopy) you may notice spotting of blood in your stool or on the toilet paper. If you underwent a bowel prep for your procedure, you may not have a normal bowel movement for a few days.  Please Note:  You might notice some irritation and congestion in your nose or some drainage.  This is from the oxygen used during your procedure.  There is no need for concern and it should clear up in a day or so.  SYMPTOMS TO REPORT IMMEDIATELY:   Following lower endoscopy (colonoscopy or flexible sigmoidoscopy):  Excessive amounts of blood in the stool  Significant tenderness or worsening of abdominal pains  Swelling of the abdomen that is new, acute  Fever of 100F or higher  For urgent or emergent issues, a gastroenterologist can be reached at any hour by calling [phone].   DIET:  We do recommend a small meal at first, but then you may proceed to your regular diet.  Drink plenty of fluids but you should avoid alcoholic beverages for 24 hours.  MEDICATIONS:  Continue present medications.  Please see handouts given to you by your recovery nurse.  ACTIVITY:  You should plan to take it easy for the rest of today and you should NOT  DRIVE or use heavy machinery until tomorrow (because of the sedation medicines used during the test).    Dr. Vena Rua nurse, Vaughan Basta, will call you to schedule consult with Dr. Johney Maine for evaluation and possible intervention for hemorrhoids.  FOLLOW UP: Our staff will call the number listed on your records the next business day following your procedure to check on you and address any questions or concerns that you may have regarding the information given to you following your procedure. If we do not reach you, we will leave a message.  However, if you are feeling well and you are not experiencing any problems, there is no need to return our call.  We will assume that you have returned to your regular daily activities without incident.  If any biopsies were taken you will be contacted by phone or by letter within the next 1-3 weeks.  Please call us at [phone] if you have not heard about the biopsies in 3 weeks.   Thank you for allowing [place] to provide for your healthcare needs today.   SIGNATURES/CONFIDENTIALITY: You and/or your care partner have signed paperwork which will be entered into your electronic medical record.  These signatures attest to the fact that that the information above on your After Visit Summary has been reviewed and is understood.  Full responsibility of the confidentiality of this discharge information lies with you and/or your care-partner.

## 2016-06-11 ENCOUNTER — Telehealth: Payer: Self-pay

## 2016-06-11 ENCOUNTER — Telehealth: Payer: Self-pay | Admitting: *Deleted

## 2016-06-11 NOTE — Telephone Encounter (Signed)
  Follow up Call-  Call back number 06/10/2016  Post procedure Call Back phone  # [phone]  Permission to leave phone message Yes  Some recent data might be hidden     Patient questions:  Do you have a fever, pain , or abdominal swelling? No. Pain Score  0 *  Have you tolerated food without any problems? Yes.    Have you been able to return to your normal activities? Yes.    Do you have any questions about your discharge instructions: Diet   No. Medications  No. Follow up visit  No.  Do you have questions or concerns about your Care? No.  Actions: * If pain score is 4 or above: No action needed, pain <4.

## 2016-06-11 NOTE — Telephone Encounter (Signed)
Pt scheduled to see Dr. Johney Maine with [facility] 07/08/16@8 :45am, pt to arrive there at 8:15am.

## 2016-06-11 NOTE — Telephone Encounter (Signed)
  Follow up Call-  Call back number 06/10/2016  Post procedure Call Back phone  # [phone]  Permission to leave phone message Yes  Some recent data might be hidden     Patient questions:  Do you have a fever, pain , or abdominal swelling? No. Pain Score  0 *  Have you tolerated food without any problems? Yes.    Have you been able to return to your normal activities? Yes.    Do you have any questions about your discharge instructions: Diet   No. Medications  No. Follow up visit  No.  Do you have questions or concerns about your Care? No.  Actions: * If pain score is 4 or above: No action needed, pain <4.

## 2016-06-19 ENCOUNTER — Encounter: Payer: Self-pay | Admitting: Internal Medicine

## 2016-06-23 ENCOUNTER — Other Ambulatory Visit: Payer: Self-pay | Admitting: Family Medicine

## 2016-06-23 ENCOUNTER — Other Ambulatory Visit: Payer: Self-pay | Admitting: Cardiology

## 2016-06-23 DIAGNOSIS — I2510 Atherosclerotic heart disease of native coronary artery without angina pectoris: Secondary | ICD-10-CM

## 2016-06-24 NOTE — Telephone Encounter (Signed)
Rx(s) sent to pharmacy electronically.  

## 2016-07-08 DIAGNOSIS — R34 Anuria and oliguria: Secondary | ICD-10-CM | POA: Diagnosis not present

## 2016-07-08 DIAGNOSIS — K643 Fourth degree hemorrhoids: Secondary | ICD-10-CM | POA: Diagnosis not present

## 2016-07-08 DIAGNOSIS — N200 Calculus of kidney: Secondary | ICD-10-CM | POA: Diagnosis not present

## 2016-07-08 DIAGNOSIS — K649 Unspecified hemorrhoids: Secondary | ICD-10-CM | POA: Diagnosis not present

## 2016-07-08 DIAGNOSIS — R8299 Other abnormal findings in urine: Secondary | ICD-10-CM | POA: Diagnosis not present

## 2016-07-24 ENCOUNTER — Other Ambulatory Visit: Payer: Self-pay | Admitting: Cardiology

## 2016-07-24 DIAGNOSIS — I2510 Atherosclerotic heart disease of native coronary artery without angina pectoris: Secondary | ICD-10-CM

## 2016-07-24 NOTE — Telephone Encounter (Signed)
Rx has been sent to the pharmacy electronically. ° °

## 2016-10-17 ENCOUNTER — Ambulatory Visit (INDEPENDENT_AMBULATORY_CARE_PROVIDER_SITE_OTHER): Payer: Medicare Other | Admitting: Family Medicine

## 2016-10-17 ENCOUNTER — Encounter: Payer: Self-pay | Admitting: Family Medicine

## 2016-10-17 VITALS — BP 120/60 | HR 67 | Temp 97.8°F | Resp 18 | Wt 174.8 lb

## 2016-10-17 DIAGNOSIS — R739 Hyperglycemia, unspecified: Secondary | ICD-10-CM

## 2016-10-17 DIAGNOSIS — I10 Essential (primary) hypertension: Secondary | ICD-10-CM | POA: Diagnosis not present

## 2016-10-17 DIAGNOSIS — I2510 Atherosclerotic heart disease of native coronary artery without angina pectoris: Secondary | ICD-10-CM | POA: Diagnosis not present

## 2016-10-17 DIAGNOSIS — E782 Mixed hyperlipidemia: Secondary | ICD-10-CM | POA: Diagnosis not present

## 2016-10-17 LAB — COMPREHENSIVE METABOLIC PANEL
ALT: 17 U/L (ref 0–53)
AST: 14 U/L (ref 0–37)
Albumin: 4.5 g/dL (ref 3.5–5.2)
Alkaline Phosphatase: 74 U/L (ref 39–117)
BUN: 12 mg/dL (ref 6–23)
CO2: 29 meq/L (ref 19–32)
Calcium: 8.9 mg/dL (ref 8.4–10.5)
Chloride: 105 mEq/L (ref 96–112)
Creatinine, Ser: 0.84 mg/dL (ref 0.40–1.50)
GFR: 95.65 mL/min (ref >60.00)
GLUCOSE: 91 mg/dL (ref 70–99)
POTASSIUM: 3.9 meq/L (ref 3.5–5.1)
Sodium: 138 mEq/L (ref 135–145)
TOTAL PROTEIN: 7.5 g/dL (ref 6.0–8.3)
Total Bilirubin: 0.7 mg/dL (ref 0.2–1.2)

## 2016-10-17 LAB — CBC
HEMATOCRIT: 44.6 % (ref 39.0–52.0)
HEMOGLOBIN: 14.8 g/dL (ref 13.0–17.0)
MCHC: 33.3 g/dL (ref 30.0–36.0)
MCV: 94.9 fl (ref 78.0–100.0)
Platelets: 238 10*3/uL (ref 150.0–400.0)
RBC: 4.7 Mil/uL (ref 4.22–5.81)
RDW: 12.8 % (ref 11.5–15.5)
WBC: 7.6 10*3/uL (ref 4.0–10.5)

## 2016-10-17 LAB — HEMOGLOBIN A1C: Hgb A1c MFr Bld: 6.2 % (ref 4.6–6.5)

## 2016-10-17 LAB — LIPID PANEL
CHOLESTEROL: 89 mg/dL (ref 0–200)
HDL: 34 mg/dL — ABNORMAL LOW (ref >39.00)
LDL CALC: 27 mg/dL (ref 0–99)
NonHDL: 55.03
TRIGLYCERIDES: 140 mg/dL (ref 0.0–149.0)
Total CHOL/HDL Ratio: 3
VLDL: 28 mg/dL (ref 0.0–40.0)

## 2016-10-17 LAB — TSH: TSH: 2.48 u[IU]/mL (ref 0.35–4.50)

## 2016-10-17 NOTE — Assessment & Plan Note (Signed)
Tolerating statin, encouraged heart healthy diet, avoid trans fats, minimize simple carbs and saturated fats. Increase exercise as tolerated 

## 2016-10-17 NOTE — Progress Notes (Signed)
Subjective:  I acted as a Education administrator for Dr. Charlett Blake. [name], [place]  Patient ID: Anthony [name], male    DOB: [birth year], 71 y.o.   MRN: [id number]  No chief complaint on file.   HPI  Patient is in today for a follow up. Patient has not acute concerns. No recent febrile illness or acute hospitalizations. Denies CP/palp/SOB/HA/congestion/fevers/GI or GU c/o. Taking meds as prescribed. He denies polyuria or polydipsia. He has been staying active and maintaining a heart healthy diet.    Patient Care Team: [name], MD as PCP - General (Family Medicine) [place], Jennefer Bravo, MD as Referring Physician (Dermatology) Alexis Frock, MD as Consulting Physician (Urology) Gerarda Fraction, MD as Referring Physician (Ophthalmology) Lelon Perla, MD as Consulting Physician (Cardiology) Michael Boston, MD as Consulting Physician (General Surgery)   Past Medical History:  Diagnosis Date  . CAD (coronary artery disease) March 2011   with a non-ST elevation myocardial infarction. cardiac catherization whic revealed-left main was normal. The LAD had proximal calcification. There was long proximal 30% stenosis. Circumflex inthe AV groove had  proximal  60% stenosis at a mid obtuse marginal. This first large mid obtuse marginal had ostial 70% stenosis. The right coronary artery had a proximal long 50% stenosis.   . Cataract   . External hemorrhoids    large external  . GERD (gastroesophageal reflux disease)   . Hyperlipidemia   . Hypertension    8-10 years  . Kidney stone   . Kidney stones 07/20/2012  . Medicare annual wellness visit, subsequent 02/12/2012   Follows with dermatology, Dr Maisie Fus with cardiology, Dr Rance Muir with gastroenterology, Dr Hilarie Fredrickson   . Myocardial infarction ([place]) 05/26/2009  . Personal history of colonic polyps 04/05/2013    Past Surgical History:  Procedure Laterality Date  . CATARACT EXTRACTION Bilateral 2013  . COLONOSCOPY  2009   polyp-[facility], 2015- x4 polyps removed  . CORONARY ANGIOPLASTY WITH STENT PLACEMENT  2011  . CYSTOSCOPY W/ RETROGRADES Bilateral 09/29/2013   Procedure: CYSTOSCOPY WITH RETROGRADE PYELOGRAM;  Surgeon: Alexis Frock, MD;  Location: [facility];  Service: Urology;  Laterality: Bilateral;  . CYSTOSCOPY WITH RETROGRADE PYELOGRAM, URETEROSCOPY AND STENT PLACEMENT N/A 06/14/2015   Procedure: CYSTOSCOPY WITH RETROGRADE PYELOGRAM, cysto litholapexy ;  Surgeon: Alexis Frock, MD;  Location: [facility];  Service: Urology;  Laterality: N/A;  . HOLMIUM LASER APPLICATION N/A 09/16/5883   Procedure: HOLMIUM LASER APPLICATION bladder stone;  Surgeon: Alexis Frock, MD;  Location: [facility];  Service: Urology;  Laterality: N/A;  . NEPHROLITHOTOMY Left 09/29/2013   Procedure: NEPHROLITHOTOMY PERCUTANEOUS WITH ACCESS;  Surgeon: Alexis Frock, MD;  Location: [facility];  Service: Urology;  Laterality: Left;  . URETEROSCOPY N/A 09/29/2013   Procedure: URETEROSCOPY;  Surgeon: Alexis Frock, MD;  Location: [facility];  Service: Urology;  Laterality: N/A;    Family History  Problem Relation Age of Onset  . Coronary artery disease Father        in his 38's  . Hyperlipidemia Father   . Hypertension Father   . Hearing loss Father   . Heart disease Mother        deceased of heart disease  . Hypertension Mother   . Hyperlipidemia Mother   . Multiple sclerosis Son   . Heart disease Maternal Grandmother   . Heart disease Maternal Grandfather   . Heart disease Paternal Grandmother   . Heart disease Paternal Grandfather   . GER disease Sister   . Other  Neg Hx        no lung cancer, colon cancer, or prostate  . Colon cancer Neg Hx     Social History   Social History  . Marital status: Married    Spouse name: N/A  . Number of children: N/A  . Years of education: N/A   Occupational History  . Not on file.   Social History Main Topics  . Smoking status: Former Smoker    Quit date:  03/12/2007  . Smokeless tobacco: Never Used     Comment: 40 pack year history  . Alcohol use No  . Drug use: No  . 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 Not on file   Social History Narrative   married -22 yrs  Anthony [name] [place])   grew up in [place]   He has one son - two grandaugthers     He is retired.  He had     a 40 pack-year smoking history but has discontinued.     prev occuptation -  [employer]   Alcohol use-no   Smoking Status:  quit   Packs/Day:  0.5   Caffeine use/day:  2 beverages daily   Does Patient Exercise:  yes             Outpatient Medications Prior to Visit  Medication Sig Dispense Refill  . amLODipine (NORVASC) 10 MG tablet TAKE 1 TABLET BY MOUTH  EVERY MORNING 90 tablet 1  . aspirin 81 MG tablet Take 81 mg by mouth daily.     Marland Kitchen losartan (COZAAR) 100 MG tablet TAKE 1 TABLET BY MOUTH  EVERY MORNING 90 tablet 1  . metoprolol tartrate (LOPRESSOR) 25 MG tablet TAKE ONE-HALF TABLET BY  MOUTH TWICE A DAY 90 tablet 1  . Multiple Vitamins-Minerals (ICAPS PO) Take by mouth daily.    . potassium citrate (UROCIT-K) 10 MEQ (1080 MG) SR tablet bid  3  . rosuvastatin (CRESTOR) 40 MG tablet TAKE 1 TABLET BY MOUTH  DAILY 90 tablet 1  . vitamin C (ASCORBIC ACID) 500 MG tablet Take 500 mg by mouth daily.    Marland Kitchen 0.9 %  sodium chloride infusion      No facility-administered medications prior to visit.     No Known Allergies  Review of Systems  Constitutional: Negative for fever and malaise/fatigue.  HENT: Negative for congestion.   Eyes: Negative for blurred vision.  Respiratory: Negative for cough and shortness of breath.   Cardiovascular: Negative for chest pain, palpitations and leg swelling.  Gastrointestinal: Negative for vomiting.  Musculoskeletal: Negative for back pain.  Skin: Negative for rash.  Neurological: Negative for loss of consciousness and headaches.         Objective:    Physical Exam  Constitutional: He is oriented to person, place, and time. He appears well-developed and well-nourished. No distress.  HENT:  Head: Normocephalic and atraumatic.  Eyes: Conjunctivae are normal.  Neck: Normal range of motion. No thyromegaly present.  Cardiovascular: Normal rate and regular rhythm.   Pulmonary/Chest: Effort normal and breath sounds normal. He has no wheezes.  Abdominal: Soft. Bowel sounds are normal. There is no tenderness.  Musculoskeletal: Normal range of motion. He exhibits no edema or deformity.  Neurological: He is alert and oriented to person, place, and time.  Skin: Skin is warm and dry. He is not diaphoretic.  Psychiatric: He has a  normal mood and affect.    BP 120/60 (BP Location: Left Arm, Patient Position: Sitting, Cuff Size: Normal)   Pulse 67   Temp 97.8 F (36.6 C) (Oral)   Resp 18   Wt 174 lb 12.8 oz (79.3 kg)   SpO2 98%   BMI 29.09 kg/m  Wt Readings from Last 3 Encounters:  10/17/16 174 lb 12.8 oz (79.3 kg)  06/10/16 178 lb (80.7 kg)  05/27/16 178 lb 3.2 oz (80.8 kg)   BP Readings from Last 3 Encounters:  10/17/16 120/60  06/10/16 115/73  04/16/16 128/62     Immunization History  Administered Date(s) Administered  . Influenza Split 12/07/2010, 11/20/2011, 12/05/2011  . Influenza Whole 01/06/2009, 11/20/2009  . Influenza, High Dose Seasonal PF 12/06/2015  . Influenza,inj,Quad PF,36+ Mos 12/09/2012, 12/15/2013, 11/30/2014  . Pneumococcal Conjugate-13 04/14/2015  . Pneumococcal Polysaccharide-23 05/11/2009  . Td 10/27/2007  . Zoster 05/30/2008    Health Maintenance  Topic Date Due  . PNA vac Low Risk Adult (2 of 2 - PPSV23) 04/13/2016  . INFLUENZA VACCINE  10/09/2016  . TETANUS/TDAP  10/26/2017  . COLONOSCOPY  06/11/2019  . Hepatitis C Screening  Completed    Lab Results  Component Value Date   WBC 7.6 10/17/2016   HGB 14.8 10/17/2016   HCT 44.6 10/17/2016   PLT 238.0 10/17/2016   GLUCOSE  91 10/17/2016   CHOL 89 10/17/2016   TRIG 140.0 10/17/2016   HDL 34.00 (L) 10/17/2016   LDLCALC 27 10/17/2016   ALT 17 10/17/2016   AST 14 10/17/2016   NA 138 10/17/2016   K 3.9 10/17/2016   CL 105 10/17/2016   CREATININE 0.84 10/17/2016   BUN 12 10/17/2016   CO2 29 10/17/2016   TSH 2.48 10/17/2016   PSA 3.32 03/24/2013   INR 1.05 05/25/2009   HGBA1C 6.2 10/17/2016    Lab Results  Component Value Date   TSH 2.48 10/17/2016   Lab Results  Component Value Date   WBC 7.6 10/17/2016   HGB 14.8 10/17/2016   HCT 44.6 10/17/2016   MCV 94.9 10/17/2016   PLT 238.0 10/17/2016   Lab Results  Component Value Date   NA 138 10/17/2016   K 3.9 10/17/2016   CO2 29 10/17/2016   GLUCOSE 91 10/17/2016   BUN 12 10/17/2016   CREATININE 0.84 10/17/2016   BILITOT 0.7 10/17/2016   ALKPHOS 74 10/17/2016   AST 14 10/17/2016   ALT 17 10/17/2016   PROT 7.5 10/17/2016   ALBUMIN 4.5 10/17/2016   CALCIUM 8.9 10/17/2016   ANIONGAP 12 09/30/2013   GFR 95.65 10/17/2016   Lab Results  Component Value Date   CHOL 89 10/17/2016   Lab Results  Component Value Date   HDL 34.00 (L) 10/17/2016   Lab Results  Component Value Date   LDLCALC 27 10/17/2016   Lab Results  Component Value Date   TRIG 140.0 10/17/2016   Lab Results  Component Value Date   CHOLHDL 3 10/17/2016   Lab Results  Component Value Date   HGBA1C 6.2 10/17/2016         Assessment & Plan:   Problem List Items Addressed This Visit    Hyperlipidemia, mixed    Tolerating statin, encouraged heart healthy diet, avoid trans fats, minimize simple carbs and saturated fats. Increase exercise as tolerated      Relevant Orders   Lipid panel (Completed)   Essential hypertension    Well controlled, no changes to meds. Encouraged heart healthy  diet such as the DASH diet and exercise as tolerated.       Relevant Orders   CBC (Completed)   Comprehensive metabolic panel (Completed)   TSH (Completed)   Hyperglycemia  - Primary    hgba1c acceptable, minimize simple carbs. Increase exercise as tolerated.      Relevant Orders   Hemoglobin A1c (Completed)      I have discontinued Mr. Seidl's vitamin C. I am also having him maintain his aspirin, Multiple Vitamins-Minerals (ICAPS PO), potassium citrate, losartan, amLODipine, rosuvastatin, and metoprolol tartrate. We will stop administering sodium chloride.  No orders of the defined types were placed in this encounter.   CMA served as Education administrator during this visit. History, Physical and Plan performed by medical provider. Documentation and orders reviewed and attested to.  Penni Homans, MD

## 2016-10-17 NOTE — Assessment & Plan Note (Signed)
Well controlled, no changes to meds. Encouraged heart healthy diet such as the DASH diet and exercise as tolerated.  °

## 2016-10-17 NOTE — Assessment & Plan Note (Signed)
hgba1c acceptable, minimize simple carbs. Increase exercise as tolerated.  

## 2016-10-17 NOTE — Patient Instructions (Signed)

## 2016-11-05 DIAGNOSIS — H353131 Nonexudative age-related macular degeneration, bilateral, early dry stage: Secondary | ICD-10-CM | POA: Diagnosis not present

## 2016-11-05 DIAGNOSIS — H35371 Puckering of macula, right eye: Secondary | ICD-10-CM | POA: Diagnosis not present

## 2016-11-05 DIAGNOSIS — Z961 Presence of intraocular lens: Secondary | ICD-10-CM | POA: Diagnosis not present

## 2016-11-05 DIAGNOSIS — H35721 Serous detachment of retinal pigment epithelium, right eye: Secondary | ICD-10-CM | POA: Diagnosis not present

## 2016-11-29 ENCOUNTER — Telehealth: Payer: Self-pay | Admitting: Family Medicine

## 2016-11-29 NOTE — Telephone Encounter (Signed)
Yes he can get them when he comes for his flu shot

## 2016-11-29 NOTE — Telephone Encounter (Signed)
Patient informed. 

## 2016-11-29 NOTE — Telephone Encounter (Signed)
°  Relation to pt: self Call back [phone]   Reason for call:  Patient requesting pneumonia injection at the same time as he's flu shot scheduled for 12/17/16

## 2016-12-15 ENCOUNTER — Other Ambulatory Visit: Payer: Self-pay | Admitting: Family Medicine

## 2016-12-15 ENCOUNTER — Other Ambulatory Visit: Payer: Self-pay | Admitting: Cardiology

## 2016-12-15 DIAGNOSIS — I2510 Atherosclerotic heart disease of native coronary artery without angina pectoris: Secondary | ICD-10-CM

## 2016-12-16 NOTE — Telephone Encounter (Signed)
Rx request sent to pharmacy.  

## 2016-12-17 ENCOUNTER — Ambulatory Visit (INDEPENDENT_AMBULATORY_CARE_PROVIDER_SITE_OTHER): Payer: Medicare Other | Admitting: Behavioral Health

## 2016-12-17 DIAGNOSIS — Z23 Encounter for immunization: Secondary | ICD-10-CM | POA: Diagnosis not present

## 2016-12-17 NOTE — Progress Notes (Signed)
Pre visit review using our clinic review tool, if applicable. No additional management support is needed unless otherwise documented below in the visit note.  Patient came in clinic for influenza & pneumococcal 23 vaccination. Verbal order given by Dr. Charlett Blake. Both injections were tolerated well. No s/s of a reaction prior to patient leaving the nurse visit.

## 2017-01-03 ENCOUNTER — Emergency Department (HOSPITAL_BASED_OUTPATIENT_CLINIC_OR_DEPARTMENT_OTHER)
Admission: EM | Admit: 2017-01-03 | Discharge: 2017-01-03 | Disposition: A | Discharge status: Home / Self Care | Payer: Medicare Other | Attending: Emergency Medicine | Admitting: Emergency Medicine

## 2017-01-03 ENCOUNTER — Encounter (HOSPITAL_BASED_OUTPATIENT_CLINIC_OR_DEPARTMENT_OTHER): Payer: Self-pay | Admitting: *Deleted

## 2017-01-03 ENCOUNTER — Emergency Department (HOSPITAL_BASED_OUTPATIENT_CLINIC_OR_DEPARTMENT_OTHER): Payer: Medicare Other

## 2017-01-03 ENCOUNTER — Other Ambulatory Visit: Payer: Self-pay | Admitting: Urology

## 2017-01-03 ENCOUNTER — Encounter (HOSPITAL_BASED_OUTPATIENT_CLINIC_OR_DEPARTMENT_OTHER): Payer: Self-pay | Admitting: Emergency Medicine

## 2017-01-03 DIAGNOSIS — Z7982 Long term (current) use of aspirin: Secondary | ICD-10-CM | POA: Insufficient documentation

## 2017-01-03 DIAGNOSIS — Z79899 Other long term (current) drug therapy: Secondary | ICD-10-CM | POA: Diagnosis not present

## 2017-01-03 DIAGNOSIS — N201 Calculus of ureter: Secondary | ICD-10-CM | POA: Diagnosis not present

## 2017-01-03 DIAGNOSIS — Z955 Presence of coronary angioplasty implant and graft: Secondary | ICD-10-CM | POA: Diagnosis not present

## 2017-01-03 DIAGNOSIS — I2510 Atherosclerotic heart disease of native coronary artery without angina pectoris: Secondary | ICD-10-CM | POA: Diagnosis not present

## 2017-01-03 DIAGNOSIS — I10 Essential (primary) hypertension: Secondary | ICD-10-CM | POA: Insufficient documentation

## 2017-01-03 DIAGNOSIS — N132 Hydronephrosis with renal and ureteral calculous obstruction: Secondary | ICD-10-CM | POA: Diagnosis not present

## 2017-01-03 DIAGNOSIS — Z87891 Personal history of nicotine dependence: Secondary | ICD-10-CM | POA: Diagnosis not present

## 2017-01-03 DIAGNOSIS — R1012 Left upper quadrant pain: Secondary | ICD-10-CM | POA: Diagnosis present

## 2017-01-03 LAB — URINALYSIS, MICROSCOPIC (REFLEX): BACTERIA UA: NONE SEEN

## 2017-01-03 LAB — URINALYSIS, ROUTINE W REFLEX MICROSCOPIC
Bilirubin Urine: NEGATIVE
GLUCOSE, UA: NEGATIVE mg/dL
Ketones, ur: NEGATIVE mg/dL
LEUKOCYTES UA: NEGATIVE
Nitrite: NEGATIVE
PH: 5.5 (ref 5.0–8.0)
PROTEIN: NEGATIVE mg/dL
Specific Gravity, Urine: >1.03 — ABNORMAL HIGH (ref 1.005–1.030)

## 2017-01-03 MED ORDER — HYDROMORPHONE HCL 2 MG PO TABS: 2.0000 mg | ORAL_TABLET | ORAL | 0 refills | Status: DC | PRN

## 2017-01-03 MED ORDER — HYDROMORPHONE HCL 1 MG/ML IJ SOLN
0.5000 mg | Freq: Once | INTRAMUSCULAR | Status: AC
  Administered 2017-01-03: 0.5 mg via INTRAVENOUS
  Filled 2017-01-03: qty 1

## 2017-01-03 MED ORDER — ONDANSETRON 8 MG PO TBDP: 8.0000 mg | ORAL_TABLET | Freq: Three times a day (TID) | ORAL | 1 refills | Status: DC | PRN

## 2017-01-03 MED ORDER — MORPHINE SULFATE (PF) 4 MG/ML IV SOLN
4.0000 mg | Freq: Once | INTRAVENOUS | Status: AC
  Administered 2017-01-03: 4 mg via INTRAVENOUS
  Filled 2017-01-03: qty 1

## 2017-01-03 MED ORDER — HYDROMORPHONE HCL 1 MG/ML IJ SOLN
1.0000 mg | Freq: Once | INTRAMUSCULAR | Status: AC
  Administered 2017-01-03: 1 mg via INTRAVENOUS
  Filled 2017-01-03: qty 1

## 2017-01-03 MED ORDER — ONDANSETRON HCL 4 MG/2ML IJ SOLN
4.0000 mg | Freq: Once | INTRAMUSCULAR | Status: AC
  Administered 2017-01-03: 4 mg via INTRAVENOUS
  Filled 2017-01-03: qty 2

## 2017-01-03 NOTE — ED Provider Notes (Signed)
[facility] Provider Note: Georgena Spurling, MD, FACEP  CSN: [id number] MRN: [id number] ARRIVAL: 01/03/17 at [place]: [facility]  Flank Pain   HISTORY OF PRESENT ILLNESS  01/03/17 5:33 AM SHONTA PHILLIS is a 71 y.o. male with a history of kidney stones.  He is here with nonradiating left flank pain which began about 3 AM.  He rates his pain about an 8 out of 10 and describes it as being like previous kidney stones.  It is not significantly affected by movement or palpation.  He had a transient wave of nausea but is not nauseated presently.  He has not vomited.  He denies fever, chills, dysuria or hematuria.   Past Medical History:  Diagnosis Date  . CAD (coronary artery disease) March 2011   with a non-ST elevation myocardial infarction. cardiac catherization whic revealed-left main was normal. The LAD had proximal calcification. There was long proximal 30% stenosis. Circumflex inthe AV groove had  proximal  60% stenosis at a mid obtuse marginal. This first large mid obtuse marginal had ostial 70% stenosis. The right coronary artery had a proximal long 50% stenosis.   . Cataract   . External hemorrhoids    large external  . GERD (gastroesophageal reflux disease)   . Hyperlipidemia   . Hypertension    8-10 years  . Kidney stones 07/20/2012  . Medicare annual wellness visit, subsequent 02/12/2012   Follows with dermatology, Dr Maisie Fus with cardiology, Dr Rance Muir with gastroenterology, Dr Hilarie Fredrickson   . Myocardial infarction ([place]) 05/26/2009  . Personal history of colonic polyps 04/05/2013    Past Surgical History:  Procedure Laterality Date  . CATARACT EXTRACTION Bilateral 2013  . COLONOSCOPY  2009   polyp-[facility], 2015- x4 polyps removed  . CORONARY ANGIOPLASTY WITH STENT PLACEMENT  2011  . CYSTOSCOPY W/ RETROGRADES Bilateral 09/29/2013   Procedure: CYSTOSCOPY WITH RETROGRADE PYELOGRAM;  Surgeon: Alexis Frock, MD;  Location: [facility];  Service: Urology;  Laterality: Bilateral;  . CYSTOSCOPY WITH RETROGRADE PYELOGRAM, URETEROSCOPY AND STENT PLACEMENT N/A 06/14/2015   Procedure: CYSTOSCOPY WITH RETROGRADE PYELOGRAM, cysto litholapexy ;  Surgeon: Alexis Frock, MD;  Location: [facility];  Service: Urology;  Laterality: N/A;  . HOLMIUM LASER APPLICATION N/A 04/14/5571   Procedure: HOLMIUM LASER APPLICATION bladder stone;  Surgeon: Alexis Frock, MD;  Location: [facility];  Service: Urology;  Laterality: N/A;  . NEPHROLITHOTOMY Left 09/29/2013   Procedure: NEPHROLITHOTOMY PERCUTANEOUS WITH ACCESS;  Surgeon: Alexis Frock, MD;  Location: [facility];  Service: Urology;  Laterality: Left;  . URETEROSCOPY N/A 09/29/2013   Procedure: URETEROSCOPY;  Surgeon: Alexis Frock, MD;  Location: [facility];  Service: Urology;  Laterality: N/A;    Family History  Problem Relation Age of Onset  . Coronary artery disease Father        in his 62's  . Hyperlipidemia Father   . Hypertension Father   . Hearing loss Father   . Heart disease Mother        deceased of heart disease  . Hypertension Mother   . Hyperlipidemia Mother   . Multiple sclerosis Son   . Heart disease Maternal Grandmother   . Heart disease Maternal Grandfather   . Heart disease Paternal Grandmother   . Heart disease Paternal Grandfather   . GER disease Sister   . Other Neg Hx        no lung cancer, colon cancer, or prostate  . Colon cancer Neg  Hx     Social History  Substance Use Topics  . Smoking status: Former Smoker    Quit date: 03/12/2007  . Smokeless tobacco: Never Used     Comment: 40 pack year history  . Alcohol use No    Prior to Admission medications   Medication Sig Start Date End Date Taking? Authorizing Provider  amLODipine (NORVASC) 10 MG tablet TAKE 1 TABLET BY MOUTH  EVERY MORNING 12/16/16   [name], MD  aspirin 81 MG tablet Take 81 mg by mouth daily.     [provider]  losartan (COZAAR) 100 MG  tablet TAKE 1 TABLET BY MOUTH  EVERY MORNING 12/16/16   [name], MD  metoprolol tartrate (LOPRESSOR) 25 MG tablet TAKE ONE-HALF TABLET BY  MOUTH TWICE A DAY 07/24/16   Lelon Perla, MD  Multiple Vitamins-Minerals (ICAPS PO) Take by mouth daily.    [provider]  potassium citrate (UROCIT-K) 10 MEQ (1080 MG) SR tablet bid 11/18/14   [provider]  rosuvastatin (CRESTOR) 40 MG tablet Take 1 tablet (40 mg total) by mouth daily. Please schedule appointment for refills. 12/16/16   Lelon Perla, MD    Allergies Patient has no known allergies.   REVIEW OF SYSTEMS  Negative except as noted here or in the History of Present Illness.   PHYSICAL EXAMINATION  Initial Vital Signs Blood pressure (!) 160/70, pulse 77, temperature 97.9 F (36.6 C), temperature source Oral, resp. rate (!) 22, height 5\' 5"  (1.651 m), weight 78 kg (172 lb), SpO2 98 %.  Examination General: Well-developed, well-nourished male in no acute distress; appearance consistent with age of record HENT: normocephalic; atraumatic Eyes: pupils equal, round and reactive to light; extraocular muscles intact; lens implants Neck: supple Heart: regular rate and rhythm Lungs: clear to auscultation bilaterally Abdomen: soft; nondistended; nontender; no masses or hepatosplenomegaly; bowel sounds present GU: No CVA tenderness Extremities: No deformity; full range of motion; pulses normal Neurologic: Awake, alert and oriented; motor function intact in all extremities and symmetric; no facial droop Skin: Warm and dry Psychiatric: Flat affect   RESULTS  Summary of this visit's results, reviewed by myself:   EKG Interpretation  Date/Time:    Ventricular Rate:    PR Interval:    QRS Duration:   QT Interval:    QTC Calculation:   R Axis:     Text Interpretation:        Laboratory Studies: Results for orders placed or performed during the hospital encounter of 01/03/17 (from the past 24  hour(s))  Urinalysis, Routine w reflex microscopic     Status: Abnormal   Collection Time: 01/03/17  5:15 AM  Result Value Ref Range   Color, Urine YELLOW YELLOW   APPearance HAZY (A) CLEAR   Specific Gravity, Urine >1.030 (H) 1.005 - 1.030   pH 5.5 5.0 - 8.0   Glucose, UA NEGATIVE NEGATIVE mg/dL   Hgb urine dipstick LARGE (A) NEGATIVE   Bilirubin Urine NEGATIVE NEGATIVE   Ketones, ur NEGATIVE NEGATIVE mg/dL   Protein, ur NEGATIVE NEGATIVE mg/dL   Nitrite NEGATIVE NEGATIVE   Leukocytes, UA NEGATIVE NEGATIVE  Urinalysis, Microscopic (reflex)     Status: Abnormal   Collection Time: 01/03/17  5:15 AM  Result Value Ref Range   RBC / HPF 6-30 0 - 5 RBC/hpf   WBC, UA 0-5 0 - 5 WBC/hpf   Bacteria, UA NONE SEEN NONE SEEN   Squamous Epithelial / LPF 0-5 (A) NONE SEEN  Mucus PRESENT    Ca Oxalate Crys, UA PRESENT    Imaging Studies: Ct Renal Stone Study  Result Date: 01/03/2017 CLINICAL DATA:  LEFT flank pain for 3 hours. Hematuria. History of kidney stones, lithotripsy, ureteroscopy and cystoscopy. EXAM: CT ABDOMEN AND PELVIS WITHOUT CONTRAST TECHNIQUE: Multidetector CT imaging of the abdomen and pelvis was performed following the standard protocol without IV contrast. COMPARISON:  Abdominal radiograph July 08, 2016 and CT abdomen and pelvis August 27, 2013 FINDINGS: LOWER CHEST: Dependent atelectasis. Included heart size is normal. Moderate coronary artery calcifications. No pericardial effusion. HEPATOBILIARY: Heterogeneous liver with patchy densities most apparent in RIGHT lobe, more conspicuous than prior CT. PANCREAS: Normal. SPLEEN: Normal. ADRENALS/URINARY TRACT: Kidneys are orthotopic, demonstrating normal size and morphology. Mild LEFT hydronephrosis to the level of the proximal ureter where a 6 mm calculus is present. 4 mm RIGHT upper pole nonobstructing nephrolithiasis. Limited assessment for renal masses on this nonenhanced examination. The unopacified ureters are normal in course and  caliber. Urinary bladder is partially distended and unremarkable. Normal adrenal glands. STOMACH/BOWEL: The stomach, small and large bowel are normal in course and caliber without inflammatory changes, sensitivity decreased by lack of enteric contrast. Moderate amount of retained large bowel stool. Normal appendix. VASCULAR/LYMPHATIC: Aortoiliac vessels are normal in course and caliber. Severe calcific atherosclerosis. No lymphadenopathy by CT size criteria. REPRODUCTIVE: Prostatomegaly invading the base of the bladder. OTHER: No intraperitoneal free fluid or free air. Small LEFT greater than RIGHT fat containing inguinal hernias. MUSCULOSKELETAL: Non-acute. Grade 1 L5-S1 anterolisthesis on the basis of chronic bilateral L5 pars interarticularis defects. Moderate to severe RIGHT L5-S1 neural foraminal narrowing. L3 hemangioma. IMPRESSION: 1. 6 mm proximal LEFT ureteral calculus resulting in mild hydronephrosis. 2. 4 mm nonobstructing RIGHT nephrolithiasis. 3. Heterogeneous liver with patchy densities most compatible with steatosis and focal fatty sparing. Consider further characterization with nonemergent contrast-enhanced MRI of the liver and liver function tests. Aortic Atherosclerosis (ICD10-I70.0). Electronically Signed   By: Elon Alas M.D.   On: 01/03/2017 06:45    ED COURSE  Nursing notes and initial vitals signs, including pulse oximetry, reviewed.  Vitals:   01/03/17 0513  BP: (!) 160/70  Pulse: 77  Resp: (!) 22  Temp: 97.9 F (36.6 C)  TempSrc: Oral  SpO2: 98%  Weight: 78 kg (172 lb)  Height: 5\' 5"  (1.651 m)   6:49 AM Patient's pain improved with IV medications.  He will contact his urologist, Dr. Tresa Moore, later today.  PROCEDURES    ED DIAGNOSES     ICD-10-CM   1. Ureterolithiasis N20.1        Doneisha Ivey, Jenny Reichmann, MD 01/03/17 [phone]

## 2017-01-03 NOTE — ED Triage Notes (Signed)
Left flank pain onset 0300 this am  Denies urinary diff,  Denies n/v at present

## 2017-01-03 NOTE — Progress Notes (Signed)
NPO AFTER MN.  ARRIVE AT [id number].  NEED ISTAT 8 AND EKG.  WILL TAKE AM MEDS W/ SIPS OF WATER AND IF NEEDED TAKE PAIN/ NAUSEA RX.

## 2017-01-06 ENCOUNTER — Other Ambulatory Visit: Payer: Self-pay | Admitting: Cardiology

## 2017-01-06 DIAGNOSIS — I2510 Atherosclerotic heart disease of native coronary artery without angina pectoris: Secondary | ICD-10-CM

## 2017-01-06 NOTE — Telephone Encounter (Signed)
REFILL 

## 2017-01-07 ENCOUNTER — Ambulatory Visit (HOSPITAL_BASED_OUTPATIENT_CLINIC_OR_DEPARTMENT_OTHER)
Admission: RE | Admit: 2017-01-07 | Discharge: 2017-01-07 | Disposition: A | Discharge status: Home / Self Care | Payer: Medicare Other | Source: Ambulatory Visit | Attending: Urology | Admitting: Urology

## 2017-01-07 ENCOUNTER — Ambulatory Visit (HOSPITAL_BASED_OUTPATIENT_CLINIC_OR_DEPARTMENT_OTHER): Payer: Medicare Other | Admitting: Anesthesiology

## 2017-01-07 ENCOUNTER — Encounter (HOSPITAL_BASED_OUTPATIENT_CLINIC_OR_DEPARTMENT_OTHER)
Admission: RE | Disposition: A | Discharge status: Home / Self Care | Payer: Self-pay | Source: Ambulatory Visit | Attending: Urology

## 2017-01-07 ENCOUNTER — Other Ambulatory Visit: Payer: Self-pay

## 2017-01-07 ENCOUNTER — Encounter (HOSPITAL_BASED_OUTPATIENT_CLINIC_OR_DEPARTMENT_OTHER): Payer: Self-pay | Admitting: Anesthesiology

## 2017-01-07 DIAGNOSIS — I252 Old myocardial infarction: Secondary | ICD-10-CM | POA: Diagnosis not present

## 2017-01-07 DIAGNOSIS — E785 Hyperlipidemia, unspecified: Secondary | ICD-10-CM | POA: Diagnosis not present

## 2017-01-07 DIAGNOSIS — N138 Other obstructive and reflux uropathy: Secondary | ICD-10-CM | POA: Diagnosis not present

## 2017-01-07 DIAGNOSIS — K219 Gastro-esophageal reflux disease without esophagitis: Secondary | ICD-10-CM | POA: Diagnosis not present

## 2017-01-07 DIAGNOSIS — I2510 Atherosclerotic heart disease of native coronary artery without angina pectoris: Secondary | ICD-10-CM | POA: Diagnosis not present

## 2017-01-07 DIAGNOSIS — I739 Peripheral vascular disease, unspecified: Secondary | ICD-10-CM | POA: Diagnosis not present

## 2017-01-07 DIAGNOSIS — Z7982 Long term (current) use of aspirin: Secondary | ICD-10-CM | POA: Diagnosis not present

## 2017-01-07 DIAGNOSIS — N210 Calculus in bladder: Secondary | ICD-10-CM | POA: Diagnosis not present

## 2017-01-07 DIAGNOSIS — N201 Calculus of ureter: Secondary | ICD-10-CM | POA: Insufficient documentation

## 2017-01-07 DIAGNOSIS — N211 Calculus in urethra: Secondary | ICD-10-CM | POA: Diagnosis not present

## 2017-01-07 DIAGNOSIS — I10 Essential (primary) hypertension: Secondary | ICD-10-CM | POA: Diagnosis not present

## 2017-01-07 DIAGNOSIS — Z87891 Personal history of nicotine dependence: Secondary | ICD-10-CM | POA: Insufficient documentation

## 2017-01-07 DIAGNOSIS — Z955 Presence of coronary angioplasty implant and graft: Secondary | ICD-10-CM | POA: Diagnosis not present

## 2017-01-07 DIAGNOSIS — I6523 Occlusion and stenosis of bilateral carotid arteries: Secondary | ICD-10-CM | POA: Insufficient documentation

## 2017-01-07 DIAGNOSIS — Z79899 Other long term (current) drug therapy: Secondary | ICD-10-CM | POA: Insufficient documentation

## 2017-01-07 DIAGNOSIS — J449 Chronic obstructive pulmonary disease, unspecified: Secondary | ICD-10-CM | POA: Insufficient documentation

## 2017-01-07 DIAGNOSIS — N401 Enlarged prostate with lower urinary tract symptoms: Secondary | ICD-10-CM | POA: Diagnosis not present

## 2017-01-07 HISTORY — DX: Allergic rhinitis, unspecified: J30.9

## 2017-01-07 HISTORY — DX: Calculus of ureter: N20.1

## 2017-01-07 HISTORY — DX: Presence of spectacles and contact lenses: Z97.3

## 2017-01-07 HISTORY — DX: Presence of coronary angioplasty implant and graft: Z95.5

## 2017-01-07 HISTORY — DX: Old myocardial infarction: I25.2

## 2017-01-07 HISTORY — DX: Personal history of adenomatous and serrated colon polyps: Z86.0101

## 2017-01-07 HISTORY — DX: Personal history of urinary calculi: Z87.442

## 2017-01-07 HISTORY — PX: CYSTOSCOPY/URETEROSCOPY/HOLMIUM LASER/STENT PLACEMENT: SHX6546

## 2017-01-07 HISTORY — DX: Occlusion and stenosis of bilateral carotid arteries: I65.23

## 2017-01-07 HISTORY — DX: Calculus of kidney: N20.0

## 2017-01-07 HISTORY — DX: Personal history of colonic polyps: Z86.010

## 2017-01-07 HISTORY — DX: Unspecified macular degeneration: H35.30

## 2017-01-07 HISTORY — PX: HOLMIUM LASER APPLICATION: SHX5852

## 2017-01-07 HISTORY — DX: Benign prostatic hyperplasia without lower urinary tract symptoms: N40.0

## 2017-01-07 LAB — POCT I-STAT, CHEM 8
BUN: 12 mg/dL (ref 6–20)
CALCIUM ION: 1.18 mmol/L (ref 1.15–1.40)
CHLORIDE: 104 mmol/L (ref 101–111)
Creatinine, Ser: 0.8 mg/dL (ref 0.61–1.24)
Glucose, Bld: 92 mg/dL (ref 65–99)
HEMATOCRIT: 43 % (ref 39.0–52.0)
Hemoglobin: 14.6 g/dL (ref 13.0–17.0)
POTASSIUM: 3.8 mmol/L (ref 3.5–5.1)
SODIUM: 141 mmol/L (ref 135–145)
TCO2: 26 mmol/L (ref 22–32)

## 2017-01-07 SURGERY — CYSTOSCOPY/URETEROSCOPY/HOLMIUM LASER/STENT PLACEMENT
Anesthesia: General | Site: Renal | Laterality: Left

## 2017-01-07 MED ORDER — MEPERIDINE HCL 25 MG/ML IJ SOLN
6.2500 mg | INTRAMUSCULAR | Status: DC | PRN
  Filled 2017-01-07: qty 1

## 2017-01-07 MED ORDER — CEFAZOLIN SODIUM-DEXTROSE 2-4 GM/100ML-% IV SOLN
INTRAVENOUS | Status: AC
  Filled 2017-01-07: qty 100

## 2017-01-07 MED ORDER — PROMETHAZINE HCL 25 MG/ML IJ SOLN
6.2500 mg | INTRAMUSCULAR | Status: DC | PRN
  Filled 2017-01-07: qty 1

## 2017-01-07 MED ORDER — LIDOCAINE 2% (20 MG/ML) 5 ML SYRINGE
INTRAMUSCULAR | Status: DC | PRN
  Administered 2017-01-07: 40 mg via INTRAVENOUS

## 2017-01-07 MED ORDER — KETOROLAC TROMETHAMINE 30 MG/ML IJ SOLN
INTRAMUSCULAR | Status: AC
  Filled 2017-01-07: qty 1

## 2017-01-07 MED ORDER — PROPOFOL 10 MG/ML IV BOLUS
INTRAVENOUS | Status: AC
  Filled 2017-01-07: qty 40

## 2017-01-07 MED ORDER — DEXAMETHASONE SODIUM PHOSPHATE 10 MG/ML IJ SOLN
INTRAMUSCULAR | Status: AC
  Filled 2017-01-07: qty 1

## 2017-01-07 MED ORDER — FENTANYL CITRATE (PF) 100 MCG/2ML IJ SOLN
INTRAMUSCULAR | Status: AC
  Filled 2017-01-07: qty 2

## 2017-01-07 MED ORDER — MIDAZOLAM HCL 2 MG/2ML IJ SOLN
0.5000 mg | Freq: Once | INTRAMUSCULAR | Status: DC | PRN
  Filled 2017-01-07: qty 2

## 2017-01-07 MED ORDER — PROPOFOL 10 MG/ML IV BOLUS
INTRAVENOUS | Status: DC | PRN
  Administered 2017-01-07: 150 mg via INTRAVENOUS

## 2017-01-07 MED ORDER — MIDAZOLAM HCL 2 MG/2ML IJ SOLN
INTRAMUSCULAR | Status: AC
  Filled 2017-01-07: qty 2

## 2017-01-07 MED ORDER — ONDANSETRON HCL 4 MG/2ML IJ SOLN
INTRAMUSCULAR | Status: AC
  Filled 2017-01-07: qty 2

## 2017-01-07 MED ORDER — FENTANYL CITRATE (PF) 100 MCG/2ML IJ SOLN
25.0000 ug | INTRAMUSCULAR | Status: DC | PRN
  Filled 2017-01-07: qty 1

## 2017-01-07 MED ORDER — LIDOCAINE 2% (20 MG/ML) 5 ML SYRINGE
INTRAMUSCULAR | Status: AC
  Filled 2017-01-07: qty 5

## 2017-01-07 MED ORDER — KETOROLAC TROMETHAMINE 30 MG/ML IJ SOLN
INTRAMUSCULAR | Status: DC | PRN
  Administered 2017-01-07: 30 mg via INTRAVENOUS

## 2017-01-07 MED ORDER — ONDANSETRON HCL 4 MG/2ML IJ SOLN
INTRAMUSCULAR | Status: DC | PRN
  Administered 2017-01-07: 4 mg via INTRAVENOUS

## 2017-01-07 MED ORDER — LACTATED RINGERS IV SOLN
INTRAVENOUS | Status: DC
Start: 2017-01-07 — End: 2017-01-07
  Administered 2017-01-07 (×2): via INTRAVENOUS
  Filled 2017-01-07: qty 1000

## 2017-01-07 MED ORDER — DEXAMETHASONE SODIUM PHOSPHATE 10 MG/ML IJ SOLN
INTRAMUSCULAR | Status: DC | PRN
  Administered 2017-01-07: 10 mg via INTRAVENOUS

## 2017-01-07 MED ORDER — FENTANYL CITRATE (PF) 100 MCG/2ML IJ SOLN
INTRAMUSCULAR | Status: DC | PRN
  Administered 2017-01-07: 50 ug via INTRAVENOUS

## 2017-01-07 MED ORDER — MIDAZOLAM HCL 2 MG/2ML IJ SOLN
INTRAMUSCULAR | Status: DC | PRN
  Administered 2017-01-07: 2 mg via INTRAVENOUS

## 2017-01-07 MED ORDER — CEFAZOLIN SODIUM-DEXTROSE 2-4 GM/100ML-% IV SOLN
2.0000 g | Freq: Once | INTRAVENOUS | Status: AC
  Administered 2017-01-07: 2 g via INTRAVENOUS
  Filled 2017-01-07: qty 100

## 2017-01-07 MED ORDER — IOHEXOL 300 MG/ML  SOLN
INTRAMUSCULAR | Status: DC | PRN
  Administered 2017-01-07: 10 mL

## 2017-01-07 MED ORDER — SODIUM CHLORIDE 0.9 % IR SOLN
Status: DC | PRN
  Administered 2017-01-07: 4000 mL

## 2017-01-07 MED ORDER — PHENAZOPYRIDINE HCL 200 MG PO TABS: 200.0000 mg | ORAL_TABLET | Freq: Three times a day (TID) | ORAL | 0 refills | Status: DC | PRN

## 2017-01-07 SURGICAL SUPPLY — 31 items
BAG DRAIN URO-CYSTO SKYTR STRL (DRAIN) ×3 IMPLANT
BASKET STONE 1.7 NGAGE (UROLOGICAL SUPPLIES) IMPLANT
BASKET ZERO TIP NITINOL 2.4FR (BASKET) ×3 IMPLANT
BENZOIN TINCTURE PRP APPL 2/3 (GAUZE/BANDAGES/DRESSINGS) ×3 IMPLANT
CATH INTERMIT  6FR 70CM (CATHETERS) ×3 IMPLANT
CLOSURE WOUND 1/2 X4 (GAUZE/BANDAGES/DRESSINGS) ×1
CLOTH BEACON ORANGE TIMEOUT ST (SAFETY) ×3 IMPLANT
FIBER LASER FLEXIVA 365 (UROLOGICAL SUPPLIES) IMPLANT
FIBER LASER TRAC TIP (UROLOGICAL SUPPLIES) ×3 IMPLANT
GLOVE BIO SURGEON STRL SZ 6.5 (GLOVE) ×2 IMPLANT
GLOVE BIO SURGEON STRL SZ7.5 (GLOVE) ×3 IMPLANT
GLOVE BIO SURGEONS STRL SZ 6.5 (GLOVE) ×1
GLOVE BIOGEL PI IND STRL 6.5 (GLOVE) ×2 IMPLANT
GLOVE BIOGEL PI INDICATOR 6.5 (GLOVE) ×4
GOWN STRL REUS W/TWL XL LVL3 (GOWN DISPOSABLE) ×3 IMPLANT
GUIDEWIRE ANG ZIPWIRE 038X150 (WIRE) ×3 IMPLANT
GUIDEWIRE STR DUAL SENSOR (WIRE) ×3 IMPLANT
INFUSOR MANOMETER BAG 3000ML (MISCELLANEOUS) ×3 IMPLANT
IV NS 1000ML (IV SOLUTION) ×2
IV NS 1000ML BAXH (IV SOLUTION) ×1 IMPLANT
IV NS IRRIG 3000ML ARTHROMATIC (IV SOLUTION) ×3 IMPLANT
KIT RM TURNOVER CYSTO AR (KITS) ×3 IMPLANT
MANIFOLD NEPTUNE II (INSTRUMENTS) ×3 IMPLANT
NS IRRIG 500ML POUR BTL (IV SOLUTION) ×6 IMPLANT
PACK CYSTO (CUSTOM PROCEDURE TRAY) ×3 IMPLANT
STENT URET 6FRX26 CONTOUR (STENTS) ×3 IMPLANT
STRIP CLOSURE SKIN 1/2X4 (GAUZE/BANDAGES/DRESSINGS) ×2 IMPLANT
SYRINGE 10CC LL (SYRINGE) ×3 IMPLANT
TUBE CONNECTING 12'X1/4 (SUCTIONS) ×1
TUBE CONNECTING 12X1/4 (SUCTIONS) ×2 IMPLANT
WATER STERILE IRR 3000ML UROMA (IV SOLUTION) ×3 IMPLANT

## 2017-01-07 NOTE — Anesthesia Procedure Notes (Signed)
Procedure Name: LMA Insertion Date/Time: 01/07/2017 10:47 AM Performed by: Wanita Chamberlain Pre-anesthesia Checklist: Patient identified, Emergency Drugs available, Suction available, Patient being monitored and Timeout performed Patient Re-evaluated:Patient Re-evaluated prior to induction Oxygen Delivery Method: Circle system utilized Preoxygenation: Pre-oxygenation with 100% oxygen Induction Type: IV induction Ventilation: Mask ventilation without difficulty LMA: LMA inserted LMA Size: 4.0 Number of attempts: 1 Placement Confirmation: breath sounds checked- equal and bilateral and CO2 detector Tube secured with: Tape Dental Injury: Teeth and Oropharynx as per pre-operative assessment

## 2017-01-07 NOTE — Anesthesia Preprocedure Evaluation (Addendum)
Anesthesia Evaluation  Patient identified by MRN, date of birth, ID band Patient awake    Reviewed: Allergy & Precautions, NPO status , Patient's Chart, lab work & pertinent test results, reviewed documented beta blocker date and time   History of Anesthesia Complications Negative for: history of anesthetic complications  Airway Mallampati: III  TM Distance: >3 FB Neck ROM: Full    Dental  (+) Dental Advisory Given, Teeth Intact,    Pulmonary COPD, former smoker (quit 2009),    breath sounds clear to auscultation       Cardiovascular hypertension, Pt. on medications and Pt. on home beta blockers + CAD, + Past MI, + Cardiac Stents (DES in RCA) and + Peripheral Vascular Disease   Rhythm:Regular Rate:Normal  12/13 Nuclear study: EF 71%, normal perfusion.   Neuro/Psych negative neurological ROS     GI/Hepatic Neg liver ROS, GERD  Medicated and Controlled,  Endo/Other  negative endocrine ROS  Renal/GU stones     Musculoskeletal   Abdominal   Peds  Hematology negative hematology ROS (+)   Anesthesia Other Findings   Reproductive/Obstetrics                           Anesthesia Physical Anesthesia Plan  ASA: III  Anesthesia Plan: General   Post-op Pain Management:    Induction: Intravenous  PONV Risk Score and Plan: 2 and Ondansetron and Dexamethasone  Airway Management Planned: LMA  Additional Equipment:   Intra-op Plan:   Post-operative Plan:   Informed Consent: I have reviewed the patients History and Physical, chart, labs and discussed the procedure including the risks, benefits and alternatives for the proposed anesthesia with the patient or authorized representative who has indicated his/her understanding and acceptance.   Dental advisory given  Plan Discussed with: CRNA and Surgeon  Anesthesia Plan Comments: (Plan routine monitors, GA- LMA OK)        Anesthesia Quick  Evaluation

## 2017-01-07 NOTE — Op Note (Addendum)
Operative Note  Preoperative diagnosis:  1.  7 mm left UPJ stone  Postoperative diagnosis: 1.  7 mm left UPJ stone  Procedure(s): 1.  Cystoscopy with lithalopaxy  2.  Left retrograde pyelogram with interpretation 3.  Left ureteroscopy 4.  Laser lithotripsy 5.  Left JJ stent placement with tether  Surgeon: Ellison Hughs, MD  Assistants:  None  Anesthesia:  Gen LMA  Complications:  None  EBL:  <5 mL  Specimens: 1. Prostatic urethral stones  Drains/Catheters: 1.  Left 11F JJ stent with tether  Intraoperative findings:  Multiple 5 mm prostatic urethral stones.  Obstructing 7 mm left UPJ stone  Indication:  Anthony [name] is a 71 y.o. male with left flank pain and an obstructing 7 mm left UPJ stone.  Description of procedure:  After informed consent was obtained, the patient was brought to the operating room and general LMA anesthesia was administered. The patient was then placed in the dorsolithotomy position and prepped and draped in usual sterile fashion. A timeout was performed. A 21 French rigid cystoscope was then inserted into the urethral meatus and advanced into prostatic urethra were multiple stones were identified. Cystoscopic graspers were then used to extract all the stones within the prostatic urethra. The cystoscope was then advanced into the bladder and complete bladder survey revealed no intravesical pathology.  A 6 [place] open-ended catheter was then inserted into the left ureteral orifice and a retrograde pyelogram was obtained, which demonstrated a filling defect within the proximal aspects of the left ureter and dilation of the left renal pelvis. There was crisp outlining of all renal calyces with no other filling defects. There were no distal filling defects within the left ureter. A Glidewire was then advanced through the lumen of the ureteral catheter and up to the left renal pelvis, under fluoroscopic guidance.  The cystoscope was then removed and  exchanged for the flexible ureteroscope, which was advanced over the wire and into position within the proximal left ureter. His obstructing stone was identified and quickly migrated into the renal pelvis. A 200  holmium laser was then used to fracture the stone into numerous smaller fragments. The flexible ureteroscope was then removed under direct vision, leaving the wire in place. There is no evidence of ureteral trauma following flex will ureteroscopy.  A 6 [place] JJ stent was then placed over the wire and into good position within the left collecting system, confirming placement via fluoroscopy. The tether the stent was left in place. The patient's bladder was then completely drained. There was a small area of bleeding within the prostatic urethra that was lightly fulgurated with Bugbee electrocautery. The cystoscope was then removed. The tether the stent was then secured to the dorsal penile shaft with Mastisol and Steri-Strips. The patient tolerated the procedure well and was transferred to the postanesthesia unit in stable condition.  Plan: The patient was instructed to remove his stent at 0600 on 01/10/17.  He will follow-up in the office in 6 weeks for left RUS.

## 2017-01-07 NOTE — Anesthesia Postprocedure Evaluation (Signed)
Anesthesia Post Note  Patient: Anthony [name]  Procedure(s) Performed: CYSTOSCOPY/RETROGRADE/URETEROSCOPY/HOLMIUM LASER/STENT PLACEMENT, FULGERATION OF PROSTATIC URETHRA (Left Renal) HOLMIUM LASER APPLICATION (Left Renal)     Patient location during evaluation: PACU Anesthesia Type: General Level of consciousness: awake and alert, oriented and patient cooperative Pain management: pain level controlled Vital Signs Assessment: post-procedure vital signs reviewed and stable Respiratory status: spontaneous breathing, nonlabored ventilation and respiratory function stable Cardiovascular status: blood pressure returned to baseline and stable Postop Assessment: no apparent nausea or vomiting Anesthetic complications: no    Last Vitals:  Vitals:   01/07/17 1215 01/07/17 1230  BP: 121/67 121/66  Pulse: 70 71  Resp: 13 13  Temp:    SpO2: 95% 98%    Last Pain:  Vitals:   01/07/17 1155  TempSrc:   PainSc: 0-No pain                 Tzirel Leonor,E. Jihad Brownlow

## 2017-01-07 NOTE — Interval H&P Note (Signed)
History and Physical Interval Note:  01/07/2017 10:38 AM  Anthony [name]  has presented today for surgery, with the diagnosis of LEFT URETEROPELVIC JUNCTION STONE  The various methods of treatment have been discussed with the patient and family. After consideration of risks, benefits and other options for treatment, the patient has consented to  Procedure(s): CYSTOSCOPY/RETROGRADE/URETEROSCOPY/HOLMIUM LASER/STENT PLACEMENT (Left) as a surgical intervention .  The patient's history has been reviewed, patient examined, no change in status, stable for surgery.  I have reviewed the patient's chart and labs.  Questions were answered to the patient's satisfaction.     [name]

## 2017-01-07 NOTE — Transfer of Care (Signed)
Immediate Anesthesia Transfer of Care Note  Patient: Anthony [name]  Procedure(s) Performed: CYSTOSCOPY/RETROGRADE/URETEROSCOPY/HOLMIUM LASER/STENT PLACEMENT, FULGERATION OF PROSTATIC URETHRA (Left Renal) HOLMIUM LASER APPLICATION (Left Renal)  Patient Location: PACU  Anesthesia Type:General  Level of Consciousness: awake, alert , oriented and patient cooperative  Airway & Oxygen Therapy: Patient Spontanous Breathing and Patient connected to nasal cannula oxygen  Post-op Assessment: Report given to RN and Post -op Vital signs reviewed and stable  Post vital signs: Reviewed and stable  Last Vitals:  Vitals:   01/07/17 0826  BP: (!) 148/67  Pulse: 73  Resp: 16  Temp: (!) 36.3 C  SpO2: 99%    Last Pain:  Vitals:   01/07/17 0826  TempSrc: Oral      Patients Stated Pain Goal: 7 (72/15/87 2761)  Complications: No apparent anesthesia complications

## 2017-01-07 NOTE — Discharge Instructions (Signed)
Remove your stent, by pulling the string exiting your urethra, on 01/10/17 at [facility] [phone] Post Ureteroscopy With or Without Stent Instructions  Definitions:  Ureter: The duct that transports urine from the kidney to the bladder. Stent:   A plastic hollow tube that is placed into the ureter, from the kidney to the                 bladder to prevent the ureter from swelling shut.  GENERAL INSTRUCTIONS:  Despite the fact that no skin incisions were used, the area around the ureter and bladder is raw and irritated. The stent is a foreign body which will further irritate the bladder wall. This irritation is manifested by increased frequency of urination, both day and night, and by an increase in the urge to urinate. In some, the urge to urinate is present almost always. Sometimes the urge is strong enough that you may not be able to stop yourself from urinating. The only real cure is to remove the stent and then give time for the bladder wall to heal which can't be done until the danger of the ureter swelling shut has passed, which varies.  You may see some blood in your urine while the stent is in place and a few days afterwards. Do not be alarmed, even if the urine was clear for a while. Get off your feet and drink lots of fluids until clearing occurs. If you start to pass clots or don't improve, call us.  DIET: You may return to your normal diet immediately. Because of the raw surface of your bladder, alcohol, spicy foods, acid type foods and drinks with caffeine may cause irritation or frequency and should be used in moderation. To keep your urine flowing freely and to avoid constipation, drink plenty of fluids during the day ( 8-10 glasses ). Tip: Avoid cranberry juice because it is very acidic.  ACTIVITY: Your physical activity doesn't need to be restricted. However, if you are very active, you may see some blood in your urine. We suggest that you reduce your  activity under these circumstances until the bleeding has stopped.  BOWELS: It is important to keep your bowels regular during the postoperative period. Straining with bowel movements can cause bleeding. A bowel movement every other day is reasonable. Use a mild laxative if needed, such as Milk of Magnesia 2-3 tablespoons, or 2 Dulcolax tablets. Call if you continue to have problems. If you have been taking narcotics for pain, before, during or after your surgery, you may be constipated. Take a laxative if necessary.   MEDICATION: You should resume your pre-surgery medications unless told not to. In addition you will often be given an antibiotic to prevent infection. These should be taken as prescribed until the bottles are finished unless you are having an unusual reaction to one of the drugs.  PROBLEMS YOU SHOULD REPORT TO [place]:  Fevers over 100.5 Fahrenheit.  Heavy bleeding, or clots ( See above notes about blood in urine ).  Inability to urinate.  Drug reactions ( hives, rash, nausea, vomiting, diarrhea ).  Severe burning or pain with urination that is not improving.  FOLLOW-UP: You will need a follow-up appointment to monitor your progress. Call for this appointment at the number listed above. Usually the first appointment will be about three to fourteen days after your surgery.      Post Anesthesia Home Care Instructions  Activity: Get plenty of rest for the remainder of the day.  A responsible individual must stay with you for 24 hours following the procedure.  For the next 24 hours, DO NOT: -Drive a car -Paediatric nurse -Drink alcoholic beverages -Take any medication unless instructed by your physician -Make any legal decisions or sign important papers.  Meals: Start with liquid foods such as gelatin or soup. Progress to regular foods as tolerated. Avoid greasy, spicy, heavy foods. If nausea and/or vomiting occur, drink only clear liquids until the nausea and/or  vomiting subsides. Call your physician if vomiting continues.  Special Instructions/Symptoms: Your throat may feel dry or sore from the anesthesia or the breathing tube placed in your throat during surgery. If this causes discomfort, gargle with warm salt water. The discomfort should disappear within 24 hours.  If you had a scopolamine patch placed behind your ear for the management of post- operative nausea and/or vomiting:  1. The medication in the patch is effective for 72 hours, after which it should be removed.  Wrap patch in a tissue and discard in the trash. Wash hands thoroughly with soap and water. 2. You may remove the patch earlier than 72 hours if you experience unpleasant side effects which may include dry mouth, dizziness or visual disturbances. 3. Avoid touching the patch. Wash your hands with soap and water after contact with the patch.

## 2017-01-07 NOTE — H&P (Signed)
Urology Preoperative H&P   Chief Complaint: Left flank pain   History of Present Illness: Anthony [name] is a 71 y.o. male with a long history of nephrolithiasis. He has required PCNL in the past. He is recently evaluated in April with a KUB and renal ultrasound that showed a possible 4 mm left lower pole calculus. He was evaluated emergency department on 01/03/2017 for acute onset of left-sided flank pain. He had a CT of the abdomen/pelvis at that time that demonstrated a 6 mm left UPJ calculus as well as a 4 mm nonobstructing right renal stone.   Currently, the patient is still having left-sided flank pain, but denies fever/chills, nausea/vomiting, dysuria or hematuria. He has prescriptions for Dilaudid, Zofran and Toradol.     Past Medical History:  Diagnosis Date  . Allergic rhinitis   . BPH (benign prostatic hyperplasia)   . CAD (coronary artery disease) 05/26/2009 :  primary cardiolgoist-  dr Stanford Breed   hx NSTEMI cardiac catherization  revealed-left main normal; LAD proximal calcification,  long proximal 30% stenosis,  Circumflex  AV groove   proximal  60% stenosis at mid obtuse marginal,  first large mid obtuse marginal  ostial 70% stenosis,  prox. to mid RCA  99% stenosis w/ intervention PCI and DES  . Carotid stenosis, asymptomatic, bilateral    per last duplex 03-31-2015  bilateral ICA 1-39%  . External hemorrhoids    large external  . GERD (gastroesophageal reflux disease)   . History of adenomatous polyp of colon   . History of kidney stones   . History of non-ST elevation myocardial infarction (NSTEMI) 05/26/2009  . Hyperlipidemia   . Hypertension    8-10 years  . Left ureteral stone   . Macular degeneration   . Nephrolithiasis    right side non-obstructive per CT 01-03-2017  . S/P drug eluting coronary stent placement 05/26/2009   DESx1  to pRCA  . Wears glasses     Past Surgical History:  Procedure Laterality Date  . CARDIOVASCULAR STRESS TEST  12/26/2011   dr  Stanford Breed   normal nuclear study w/ no ischemia/  normal LV function and wall motion , ef 71%  . CATARACT EXTRACTION W/ INTRAOCULAR LENS  IMPLANT, BILATERAL  2010  . COLONOSCOPY  last one 06-10-2016  . CORONARY ANGIOPLASTY WITH STENT PLACEMENT  05-26-2009  dr hochrein/ dr Darnell Level brodie   Severe RCA stenosis and moderate stenosis in the left system, ef 50% with inferior hypokinsis:  PCI and DES x1 to RCA (Promus)  . CYSTOSCOPY W/ RETROGRADES Bilateral 09/29/2013   Procedure: CYSTOSCOPY WITH RETROGRADE PYELOGRAM;  Surgeon: Alexis Frock, MD;  Location: [facility];  Service: Urology;  Laterality: Bilateral;  . CYSTOSCOPY WITH RETROGRADE PYELOGRAM, URETEROSCOPY AND STENT PLACEMENT N/A 06/14/2015   Procedure: CYSTOSCOPY WITH RETROGRADE PYELOGRAM, cysto litholapexy ;  Surgeon: Alexis Frock, MD;  Location: [facility];  Service: Urology;  Laterality: N/A;  . HOLMIUM LASER APPLICATION N/A 11/15/2834   Procedure: HOLMIUM LASER APPLICATION bladder stone;  Surgeon: Alexis Frock, MD;  Location: [facility];  Service: Urology;  Laterality: N/A;  . NEPHROLITHOTOMY Left 09/29/2013   Procedure: NEPHROLITHOTOMY PERCUTANEOUS WITH ACCESS;  Surgeon: Alexis Frock, MD;  Location: [facility];  Service: Urology;  Laterality: Left;  . URETEROSCOPY N/A 09/29/2013   Procedure: URETEROSCOPY;  Surgeon: Alexis Frock, MD;  Location: [facility];  Service: Urology;  Laterality: N/A;    Allergies: No Known Allergies  Family History  Problem Relation Age of Onset  .  Coronary artery disease Father        in his 6's  . Hyperlipidemia Father   . Hypertension Father   . Hearing loss Father   . Heart disease Mother        deceased of heart disease  . Hypertension Mother   . Hyperlipidemia Mother   . Multiple sclerosis Son   . Heart disease Maternal Grandmother   . Heart disease Maternal Grandfather   . Heart disease Paternal Grandmother   . Heart disease Paternal Grandfather   . GER disease Sister    . Other Neg Hx        no lung cancer, colon cancer, or prostate  . Colon cancer Neg Hx     Social History:  reports that he quit smoking about 9 years ago. His smoking use included Cigarettes. He quit after 45.00 years of use. He has never used smokeless tobacco. He reports that he does not drink alcohol or use drugs.  ROS: A complete review of systems was performed.  All systems are negative except for pertinent findings as noted.  Physical Exam:  Vital signs in last 24 hours:   Constitutional:  Alert and oriented, No acute distress Cardiovascular: Regular rate and rhythm, No JVD Respiratory: Normal respiratory effort, Lungs clear bilaterally GI: Abdomen is soft, nontender, nondistended, no abdominal masses GU: No CVA tenderness Lymphatic: No lymphadenopathy Neurologic: Grossly intact, no focal deficits Psychiatric: Normal mood and affect  Laboratory Data:  No results for input(s): WBC, HGB, HCT, PLT in the last 72 hours.  No results for input(s): NA, K, CL, GLUCOSE, BUN, CALCIUM, CREATININE in the last 72 hours.  Invalid input(s): CO3   No results found for this or any previous visit (from the past 24 hour(s)). No results found for this or any previous visit (from the past 240 hour(s)).  Renal Function: No results for input(s): CREATININE in the last 168 hours. CrCl cannot be calculated (Patient's most recent lab result is older than the maximum 21 days allowed.).  Radiologic Imaging: No results found.  I independently reviewed the above imaging studies.  Assessment and Plan Anthony [name] is a 71 y.o. male with a 6 mm left UPJ stone.  -The risks, benefits and alternatives of cystoscopy with left ureteroscopy, laser lithotripsy and right JJ stent placement was discussed with the patient.  He voices understanding and wishes to proceed.      Ellison Hughs, MD 01/07/2017, 8:18 AM  [facility] Pager: [phone]

## 2017-01-08 ENCOUNTER — Encounter (HOSPITAL_BASED_OUTPATIENT_CLINIC_OR_DEPARTMENT_OTHER): Payer: Self-pay | Admitting: Urology

## 2017-02-13 DIAGNOSIS — N400 Enlarged prostate without lower urinary tract symptoms: Secondary | ICD-10-CM | POA: Diagnosis not present

## 2017-02-13 DIAGNOSIS — N201 Calculus of ureter: Secondary | ICD-10-CM | POA: Diagnosis not present

## 2017-02-24 ENCOUNTER — Other Ambulatory Visit: Payer: Self-pay | Admitting: Cardiology

## 2017-02-24 ENCOUNTER — Other Ambulatory Visit: Payer: Self-pay | Admitting: Family Medicine

## 2017-02-24 DIAGNOSIS — I2510 Atherosclerotic heart disease of native coronary artery without angina pectoris: Secondary | ICD-10-CM

## 2017-03-13 ENCOUNTER — Ambulatory Visit (HOSPITAL_BASED_OUTPATIENT_CLINIC_OR_DEPARTMENT_OTHER)
Admission: RE | Admit: 2017-03-13 | Discharge: 2017-03-13 | Disposition: A | Discharge status: Home / Self Care | Payer: Medicare Other | Source: Ambulatory Visit | Attending: Family Medicine | Admitting: Family Medicine

## 2017-03-13 ENCOUNTER — Encounter: Payer: Self-pay | Admitting: Family Medicine

## 2017-03-13 ENCOUNTER — Ambulatory Visit (INDEPENDENT_AMBULATORY_CARE_PROVIDER_SITE_OTHER): Payer: Medicare Other | Admitting: Family Medicine

## 2017-03-13 VITALS — BP 113/45 | HR 69 | Temp 99.0°F | Resp 18 | Ht 65.0 in | Wt 175.2 lb

## 2017-03-13 DIAGNOSIS — R05 Cough: Secondary | ICD-10-CM

## 2017-03-13 DIAGNOSIS — K219 Gastro-esophageal reflux disease without esophagitis: Secondary | ICD-10-CM

## 2017-03-13 DIAGNOSIS — R739 Hyperglycemia, unspecified: Secondary | ICD-10-CM | POA: Diagnosis not present

## 2017-03-13 DIAGNOSIS — I10 Essential (primary) hypertension: Secondary | ICD-10-CM | POA: Diagnosis not present

## 2017-03-13 DIAGNOSIS — R058 Other specified cough: Secondary | ICD-10-CM

## 2017-03-13 DIAGNOSIS — Z87891 Personal history of nicotine dependence: Secondary | ICD-10-CM | POA: Diagnosis not present

## 2017-03-13 DIAGNOSIS — R059 Cough, unspecified: Secondary | ICD-10-CM

## 2017-03-13 MED ORDER — FLUTICASONE PROPIONATE 50 MCG/ACT NA SUSP: 2.0000 | Freq: Every day | NASAL | 6 refills | Status: DC

## 2017-03-13 MED ORDER — ALBUTEROL SULFATE HFA 108 (90 BASE) MCG/ACT IN AERS: 2.0000 | INHALATION_SPRAY | Freq: Four times a day (QID) | RESPIRATORY_TRACT | 0 refills | Status: DC | PRN

## 2017-03-13 MED ORDER — DOXYCYCLINE HYCLATE 100 MG PO TABS: 100.0000 mg | ORAL_TABLET | Freq: Two times a day (BID) | ORAL | 0 refills | Status: DC

## 2017-03-13 MED ORDER — FLUTICASONE PROPIONATE HFA 110 MCG/ACT IN AERO: 2.0000 | INHALATION_SPRAY | Freq: Two times a day (BID) | RESPIRATORY_TRACT | 1 refills | Status: DC | PRN

## 2017-03-13 MED ORDER — MONTELUKAST SODIUM 10 MG PO TABS: 10.0000 mg | ORAL_TABLET | Freq: Every day | ORAL | 3 refills | Status: DC

## 2017-03-13 MED ORDER — METHYLPREDNISOLONE 4 MG PO TABS: ORAL_TABLET | ORAL | 0 refills | Status: DC

## 2017-03-13 MED ORDER — CETIRIZINE HCL 10 MG PO TABS: 10.0000 mg | ORAL_TABLET | Freq: Every day | ORAL | 11 refills | Status: DC

## 2017-03-13 NOTE — Patient Instructions (Signed)

## 2017-03-13 NOTE — Assessment & Plan Note (Signed)
Avoid offending foods, start probiotics. Do not eat large meals in late evening and consider raising head of bed.  

## 2017-03-13 NOTE — Assessment & Plan Note (Signed)
minimize simple carbs. Increase exercise as tolerated.  

## 2017-03-13 NOTE — Assessment & Plan Note (Signed)
Denies CP/palp/SOB/HA/congestion/fevers/GI or GU c/o. Taking meds as prescribed 

## 2017-03-13 NOTE — Progress Notes (Signed)
Subjective:  I acted as a Education administrator for [employer]. Yancey Flemings, [place]   Patient ID: VIYAN ROSAMOND, male    DOB: 1945-08-14, 72 y.o.   MRN: [id number]  Chief Complaint  Patient presents with  . Cough    HPI  Patient is in today for an acute visit and he reports he is not feeling well.  He has been struggling with a cough for weeks now.  It is nonproductive.  He reports a similar cough occurs each fall and last through much of the winter.  He denies fevers or chills.  The cough does not keep him up at night.  He sleeps on his right side he is able to sleep through the night. Denies CP/palp/SOB/HA/congestion/fevers/GI or GU c/o. Taking meds as prescribed. No other acute concerns. In past has had similar illnesses.  Patient Care Team: [name], MD as PCP - General (Family Medicine) [place], Jennefer Bravo, MD as Referring Physician (Dermatology) Alexis Frock, MD as Consulting Physician (Urology) Gerarda Fraction, MD as Referring Physician (Ophthalmology) Lelon Perla, MD as Consulting Physician (Cardiology) Michael Boston, MD as Consulting Physician (General Surgery)   Past Medical History:  Diagnosis Date  . Allergic rhinitis   . BPH (benign prostatic hyperplasia)   . CAD (coronary artery disease) 05/26/2009 :  primary cardiolgoist-  dr Stanford Breed   hx NSTEMI cardiac catherization  revealed-left main normal; LAD proximal calcification,  long proximal 30% stenosis,  Circumflex  AV groove   proximal  60% stenosis at mid obtuse marginal,  first large mid obtuse marginal  ostial 70% stenosis,  prox. to mid RCA  99% stenosis w/ intervention PCI and DES  . Carotid stenosis, asymptomatic, bilateral    per last duplex 03-31-2015  bilateral ICA 1-39%  . External hemorrhoids    large external  . GERD (gastroesophageal reflux disease)   . History of adenomatous polyp of colon   . History of kidney stones   . History of non-ST elevation myocardial infarction (NSTEMI) 05/26/2009  .  Hyperlipidemia   . Hypertension    8-10 years  . Left ureteral stone   . Macular degeneration   . Nephrolithiasis    right side non-obstructive per CT 01-03-2017  . S/P drug eluting coronary stent placement 05/26/2009   DESx1  to pRCA  . Wears glasses     Past Surgical History:  Procedure Laterality Date  . CARDIOVASCULAR STRESS TEST  12/26/2011   dr Stanford Breed   normal nuclear study w/ no ischemia/  normal LV function and wall motion , ef 71%  . CATARACT EXTRACTION W/ INTRAOCULAR LENS  IMPLANT, BILATERAL  2010  . COLONOSCOPY  last one 06-10-2016  . CORONARY ANGIOPLASTY WITH STENT PLACEMENT  05-26-2009  dr hochrein/ dr Darnell Level brodie   Severe RCA stenosis and moderate stenosis in the left system, ef 50% with inferior hypokinsis:  PCI and DES x1 to RCA (Promus)  . CYSTOSCOPY W/ RETROGRADES Bilateral 09/29/2013   Procedure: CYSTOSCOPY WITH RETROGRADE PYELOGRAM;  Surgeon: Alexis Frock, MD;  Location: [facility];  Service: Urology;  Laterality: Bilateral;  . CYSTOSCOPY WITH RETROGRADE PYELOGRAM, URETEROSCOPY AND STENT PLACEMENT N/A 06/14/2015   Procedure: CYSTOSCOPY WITH RETROGRADE PYELOGRAM, cysto litholapexy ;  Surgeon: Alexis Frock, MD;  Location: [facility];  Service: Urology;  Laterality: N/A;  . CYSTOSCOPY/URETEROSCOPY/HOLMIUM LASER/STENT PLACEMENT Left 01/07/2017   Procedure: CYSTOSCOPY/RETROGRADE/URETEROSCOPY/HOLMIUM LASER/STENT PLACEMENT, FULGERATION OF PROSTATIC URETHRA;  Surgeon: Ceasar Mons, MD;  Location: [facility];  Service: Urology;  Laterality:  Left;  . HOLMIUM LASER APPLICATION N/A 08/13/3327   Procedure: HOLMIUM LASER APPLICATION bladder stone;  Surgeon: Alexis Frock, MD;  Location: [facility];  Service: Urology;  Laterality: N/A;  . HOLMIUM LASER APPLICATION Left 51/88/4166   Procedure: HOLMIUM LASER APPLICATION;  Surgeon: Ceasar Mons, MD;  Location: [facility];  Service: Urology;   Laterality: Left;  . NEPHROLITHOTOMY Left 09/29/2013   Procedure: NEPHROLITHOTOMY PERCUTANEOUS WITH ACCESS;  Surgeon: Alexis Frock, MD;  Location: [facility];  Service: Urology;  Laterality: Left;  . URETEROSCOPY N/A 09/29/2013   Procedure: URETEROSCOPY;  Surgeon: Alexis Frock, MD;  Location: [facility];  Service: Urology;  Laterality: N/A;    Family History  Problem Relation Age of Onset  . Coronary artery disease Father        in his 35's  . Hyperlipidemia Father   . Hypertension Father   . Hearing loss Father   . Heart disease Mother        deceased of heart disease  . Hypertension Mother   . Hyperlipidemia Mother   . Multiple sclerosis Son   . Heart disease Maternal Grandmother   . Heart disease Maternal Grandfather   . Heart disease Paternal Grandmother   . Heart disease Paternal Grandfather   . GER disease Sister   . Other Neg Hx        no lung cancer, colon cancer, or prostate  . Colon cancer Neg Hx     Social History   Socioeconomic History  . Marital status: Married    Spouse name: Not on file  . Number of children: Not on file  . Years of education: Not on file  . Highest education level: Not on file  Social Needs  . Financial resource strain: Not on file  . Food insecurity - worry: Not on file  . Food insecurity - inability: Not on file  . Transportation needs - medical: Not on file  . Transportation needs - non-medical: Not on file  Occupational History  . Not on file  Tobacco Use  . Smoking status: Former Smoker    Years: 45.00    Types: Cigarettes    Last attempt to quit: 03/12/2007    Years since quitting: 10.0  . Smokeless tobacco: Never Used  Substance and Sexual Activity  . Alcohol use: No  . Drug use: No  . 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 Not on file  Social History Narrative   married -77 yrs  [name] [place])   grew up in [place]   He has one son -  two grandaugthers     He is retired.  He had     a 40 pack-year smoking history but has discontinued.     prev occuptation -  [employer]   Alcohol use-no   Smoking Status:  quit   Packs/Day:  0.5   Caffeine use/day:  2 beverages daily   Does Patient Exercise:  yes             Outpatient Medications Prior to Visit  Medication Sig Dispense Refill  . amLODipine (NORVASC) 10 MG tablet TAKE 1 TABLET BY MOUTH  EVERY MORNING 90 tablet 0  . aspirin 81 MG tablet Take 81 mg by mouth every morning.     . calcium carbonate (TUMS - DOSED IN MG ELEMENTAL CALCIUM) 500 MG chewable tablet Chew 1  tablet by mouth as needed for indigestion or heartburn.    Marland Kitchen HYDROmorphone (DILAUDID) 2 MG tablet Take 1 tablet (2 mg total) by mouth every 4 (four) hours as needed for severe pain (may cause constipation). (Patient not taking: Reported on 03/13/2017) 30 tablet 0  . losartan (COZAAR) 100 MG tablet TAKE 1 TABLET BY MOUTH  EVERY MORNING 90 tablet 0  . metoprolol tartrate (LOPRESSOR) 25 MG tablet Take 0.5 tablets (12.5 mg total) by mouth 2 (two) times daily. NEED OV. 90 tablet 0  . Multiple Vitamins-Minerals (ICAPS PO) Take by mouth every morning.     . ondansetron (ZOFRAN ODT) 8 MG disintegrating tablet Take 1 tablet (8 mg total) by mouth every 8 (eight) hours as needed. (Patient not taking: Reported on 03/13/2017) 10 tablet 1  . phenazopyridine (PYRIDIUM) 200 MG tablet Take 1 tablet (200 mg total) by mouth 3 (three) times daily as needed for pain. (Patient not taking: Reported on 03/13/2017) 20 tablet 0  . potassium citrate (UROCIT-K) 10 MEQ (1080 MG) SR tablet takes one tablet twice daily  3  . rosuvastatin (CRESTOR) 40 MG tablet TAKE 1 TABLET BY MOUTH  DAILY. 30 tablet 0   No facility-administered medications prior to visit.     No Known Allergies  Review of Systems  Constitutional: Negative for fever and malaise/fatigue.  HENT: Positive for congestion and sore throat. Negative for sinus pain.     Eyes: Negative for blurred vision.  Respiratory: Positive for cough. Negative for sputum production, shortness of breath and wheezing.   Cardiovascular: Negative for chest pain, palpitations and leg swelling.  Gastrointestinal: Negative for abdominal pain, blood in stool and nausea.  Genitourinary: Negative for dysuria, flank pain and frequency.  Musculoskeletal: Negative for falls.  Skin: Negative for rash.  Neurological: Negative for dizziness, loss of consciousness and headaches.  Endo/Heme/Allergies: Negative for environmental allergies.  Psychiatric/Behavioral: Negative for depression. The patient is not nervous/anxious.        Objective:    Physical Exam  Constitutional: He is oriented to person, place, and time. He appears well-developed and well-nourished. No distress.  HENT:  Head: Normocephalic and atraumatic.  Eyes: Conjunctivae are normal.  Neck: Neck supple. No thyromegaly present.  Cardiovascular: Normal rate, regular rhythm and normal heart sounds.  No murmur heard. Pulmonary/Chest: Effort normal and breath sounds normal. No respiratory distress. He has no wheezes.  Abdominal: Soft. Bowel sounds are normal. He exhibits no mass. There is no tenderness.  Musculoskeletal: He exhibits no edema.  Lymphadenopathy:    He has no cervical adenopathy.  Neurological: He is alert and oriented to person, place, and time.  Skin: Skin is warm and dry.  Psychiatric: He has a normal mood and affect. His behavior is normal.    BP (!) 113/45 (BP Location: Left Arm, Patient Position: Sitting, Cuff Size: Normal)   Pulse 69   Temp 99 F (37.2 C) (Oral)   Resp 18   Ht 5\' 5"  (1.651 m)   Wt 175 lb 3.2 oz (79.5 kg)   SpO2 98%   BMI 29.15 kg/m  Wt Readings from Last 3 Encounters:  03/13/17 175 lb 3.2 oz (79.5 kg)  01/07/17 174 lb (78.9 kg)  01/03/17 172 lb (78 kg)   BP Readings from Last 3 Encounters:  03/13/17 (!) 113/45  01/07/17 (!) 142/68  01/03/17 (!) 160/70      Immunization History  Administered Date(s) Administered  . Influenza Split 12/07/2010, 11/20/2011, 12/05/2011  . Influenza Whole 01/06/2009, 11/20/2009  .  Influenza, High Dose Seasonal PF 12/06/2015, 12/17/2016  . Influenza,inj,Quad PF,6+ Mos 12/09/2012, 12/15/2013, 11/30/2014  . Pneumococcal Conjugate-13 04/14/2015  . Pneumococcal Polysaccharide-23 05/11/2009, 12/17/2016  . Td 10/27/2007  . Zoster 05/30/2008    Health Maintenance  Topic Date Due  . Samul Dada  10/26/2017  . COLONOSCOPY  06/11/2019  . INFLUENZA VACCINE  Completed  . Hepatitis C Screening  Completed  . PNA vac Low Risk Adult  Completed    Lab Results  Component Value Date   WBC 7.6 10/17/2016   HGB 14.6 01/07/2017   HCT 43.0 01/07/2017   PLT 238.0 10/17/2016   GLUCOSE 92 01/07/2017   CHOL 89 10/17/2016   TRIG 140.0 10/17/2016   HDL 34.00 (L) 10/17/2016   LDLCALC 27 10/17/2016   ALT 17 10/17/2016   AST 14 10/17/2016   NA 141 01/07/2017   K 3.8 01/07/2017   CL 104 01/07/2017   CREATININE 0.80 01/07/2017   BUN 12 01/07/2017   CO2 29 10/17/2016   TSH 2.48 10/17/2016   PSA 3.32 03/24/2013   INR 1.05 05/25/2009   HGBA1C 6.2 10/17/2016    Lab Results  Component Value Date   TSH 2.48 10/17/2016   Lab Results  Component Value Date   WBC 7.6 10/17/2016   HGB 14.6 01/07/2017   HCT 43.0 01/07/2017   MCV 94.9 10/17/2016   PLT 238.0 10/17/2016   Lab Results  Component Value Date   NA 141 01/07/2017   K 3.8 01/07/2017   CO2 29 10/17/2016   GLUCOSE 92 01/07/2017   BUN 12 01/07/2017   CREATININE 0.80 01/07/2017   BILITOT 0.7 10/17/2016   ALKPHOS 74 10/17/2016   AST 14 10/17/2016   ALT 17 10/17/2016   PROT 7.5 10/17/2016   ALBUMIN 4.5 10/17/2016   CALCIUM 8.9 10/17/2016   ANIONGAP 12 09/30/2013   GFR 95.65 10/17/2016   Lab Results  Component Value Date   CHOL 89 10/17/2016   Lab Results  Component Value Date   HDL 34.00 (L) 10/17/2016   Lab Results  Component Value Date    LDLCALC 27 10/17/2016   Lab Results  Component Value Date   TRIG 140.0 10/17/2016   Lab Results  Component Value Date   CHOLHDL 3 10/17/2016   Lab Results  Component Value Date   HGBA1C 6.2 10/17/2016         Assessment & Plan:   Problem List Items Addressed This Visit    Essential hypertension    Denies CP/palp/SOB/HA/congestion/fevers/GI or GU c/o. Taking meds as prescribed      GERD    Avoid offending foods, start probiotics. Do not eat large meals in late evening and consider raising head of bed.       Hyperglycemia    minimize simple carbs. Increase exercise as tolerated.       Cough - Primary   Relevant Orders   DG Chest 2 View   Allergic cough    He notes this cough gets out of hand to each fall and last for most of the winter.  He was not taking antihistamines or nasal steroids before the symptoms occurred but he has started back on Flonase and cetirizine.  He has been having trouble with this cough each fall for about 5 years now.  It is not productive.  Chest x-ray is obtained today and he is asked to consider possibility of referral to pulmonology and low-dose Scanning due to his history of cigarette smoking which he quit in 2009  he will let us know when he returns next month for further evaluation.  He is encouraged to use his Flonase, Zyrtec, Singulair each day starting in early November next year to see if we can prevent this outbreak.  He is started on Flovent and albuterol as well and if his symptoms continue to worsen has a Medrol Dosepak and doxycycline.         I am having Bishop Limbo "Bobby" start on albuterol, fluticasone, cetirizine, montelukast, doxycycline, methylPREDNISolone, and fluticasone. I am also having him maintain his aspirin, Multiple Vitamins-Minerals (ICAPS PO), potassium citrate, HYDROmorphone, ondansetron, calcium carbonate, metoprolol tartrate, phenazopyridine, rosuvastatin, amLODipine, and losartan.  Meds ordered this  encounter  Medications  . albuterol (PROVENTIL HFA;VENTOLIN HFA) 108 (90 Base) MCG/ACT inhaler    Sig: Inhale 2 puffs into the lungs every 6 (six) hours as needed for wheezing or shortness of breath.    Dispense:  1 Inhaler    Refill:  0  . fluticasone (FLONASE) 50 MCG/ACT nasal spray    Sig: Place 2 sprays into both nostrils daily.    Dispense:  16 g    Refill:  6  . cetirizine (ZYRTEC) 10 MG tablet    Sig: Take 1 tablet (10 mg total) by mouth daily.    Dispense:  30 tablet    Refill:  11  . montelukast (SINGULAIR) 10 MG tablet    Sig: Take 1 tablet (10 mg total) by mouth at bedtime.    Dispense:  30 tablet    Refill:  3  . doxycycline (VIBRA-TABS) 100 MG tablet    Sig: Take 1 tablet (100 mg total) by mouth 2 (two) times daily.    Dispense:  20 tablet    Refill:  0  . methylPREDNISolone (MEDROL) 4 MG tablet    Sig: 5 tab po qd X 1d then 4 tab po qd X 1d then 3 tab po qd X 1d then 2 tab po qd then 1 tab po qd    Dispense:  15 tablet    Refill:  0  . fluticasone (FLOVENT HFA) 110 MCG/ACT inhaler    Sig: Inhale 2 puffs into the lungs 2 (two) times daily as needed.    Dispense:  1 Inhaler    Refill:  1    CMA served as scribe during this visit. History, Physical and Plan performed by medical provider. Documentation and orders reviewed and attested to.  Penni Homans, MD

## 2017-03-13 NOTE — Assessment & Plan Note (Signed)
He notes this cough gets out of hand to each fall and last for most of the winter.  He was not taking antihistamines or nasal steroids before the symptoms occurred but he has started back on Flonase and cetirizine.  He has been having trouble with this cough each fall for about 5 years now.  It is not productive.  Chest x-ray is obtained today and he is asked to consider possibility of referral to pulmonology and low-dose Scanning due to his history of cigarette smoking which he quit in 2009 he will let us know when he returns next month for further evaluation.  He is encouraged to use his Flonase, Zyrtec, Singulair each day starting in early November next year to see if we can prevent this outbreak.  He is started on Flovent and albuterol as well and if his symptoms continue to worsen has a Medrol Dosepak and doxycycline.

## 2017-03-25 ENCOUNTER — Other Ambulatory Visit: Payer: Self-pay | Admitting: Cardiology

## 2017-03-25 DIAGNOSIS — I2510 Atherosclerotic heart disease of native coronary artery without angina pectoris: Secondary | ICD-10-CM

## 2017-03-26 NOTE — Progress Notes (Signed)
HPI: FU CAD; admitted in March 2011 with a non-ST elevation myocardial infarction. He underwent cardiac catheterization which revealed - Left main was normal. The LAD had proximal calcification. There was long proximal 30% stenosis. Circumflex in the AV groove had proximal 60% stenosis at a mid obtuse marginal. This first large mid obtuse marginal had ostial 70% stenosis. The right coronary artery had a proximal long 50% stenosis. There was mid subtotal stenosis. The EF was 50% with inferior hypokinesis. Patient had a drug-eluting stent to the right coronary artery at that time. Abdominal ultrasound in July of 2011 showed no aneurysm. Nuclear study in December of 2013 showed an ejection fraction of 71% and normal perfusion. Carotid Dopplers in Jan 2017 showed 1-39 bilateral stenosis. Since I last saw him, the patient denies any dyspnea on exertion, orthopnea, PND, pedal edema, palpitations, syncope or chest pain.    Current Outpatient Medications  Medication Sig Dispense Refill  . amLODipine (NORVASC) 10 MG tablet TAKE 1 TABLET BY MOUTH  EVERY MORNING 90 tablet 0  . aspirin 81 MG tablet Take 81 mg by mouth every morning.     . calcium carbonate (TUMS - DOSED IN MG ELEMENTAL CALCIUM) 500 MG chewable tablet Chew 1 tablet by mouth as needed for indigestion or heartburn.    . losartan (COZAAR) 100 MG tablet TAKE 1 TABLET BY MOUTH  EVERY MORNING 90 tablet 0  . metoprolol tartrate (LOPRESSOR) 25 MG tablet TAKE ONE-HALF TABLET BY  MOUTH TWICE A DAY 60 tablet 0  . Multiple Vitamins-Minerals (ICAPS PO) Take by mouth every morning.     . potassium citrate (UROCIT-K) 10 MEQ (1080 MG) SR tablet takes one tablet twice daily  3  . rosuvastatin (CRESTOR) 40 MG tablet TAKE 1 TABLET BY MOUTH  DAILY. 30 tablet 0   No current facility-administered medications for this visit.      Past Medical History:  Diagnosis Date  . Allergic rhinitis   . BPH (benign prostatic hyperplasia)   . CAD (coronary artery  disease) 05/26/2009 :  primary cardiolgoist-  dr Stanford Breed   hx NSTEMI cardiac catherization  revealed-left main normal; LAD proximal calcification,  long proximal 30% stenosis,  Circumflex  AV groove   proximal  60% stenosis at mid obtuse marginal,  first large mid obtuse marginal  ostial 70% stenosis,  prox. to mid RCA  99% stenosis w/ intervention PCI and DES  . Carotid stenosis, asymptomatic, bilateral    per last duplex 03-31-2015  bilateral ICA 1-39%  . External hemorrhoids    large external  . GERD (gastroesophageal reflux disease)   . History of adenomatous polyp of colon   . History of kidney stones   . History of non-ST elevation myocardial infarction (NSTEMI) 05/26/2009  . Hyperlipidemia   . Hypertension    8-10 years  . Left ureteral stone   . Macular degeneration   . Nephrolithiasis    right side non-obstructive per CT 01-03-2017  . S/P drug eluting coronary stent placement 05/26/2009   DESx1  to pRCA  . Wears glasses     Past Surgical History:  Procedure Laterality Date  . CARDIOVASCULAR STRESS TEST  12/26/2011   dr Stanford Breed   normal nuclear study w/ no ischemia/  normal LV function and wall motion , ef 71%  . CATARACT EXTRACTION W/ INTRAOCULAR LENS  IMPLANT, BILATERAL  2010  . COLONOSCOPY  last one 06-10-2016  . CORONARY ANGIOPLASTY WITH STENT PLACEMENT  05-26-2009  dr hochrein/ dr  bruce brodie   Severe RCA stenosis and moderate stenosis in the left system, ef 50% with inferior hypokinsis:  PCI and DES x1 to RCA (Promus)  . CYSTOSCOPY W/ RETROGRADES Bilateral 09/29/2013   Procedure: CYSTOSCOPY WITH RETROGRADE PYELOGRAM;  Surgeon: Alexis Frock, MD;  Location: [facility];  Service: Urology;  Laterality: Bilateral;  . CYSTOSCOPY WITH RETROGRADE PYELOGRAM, URETEROSCOPY AND STENT PLACEMENT N/A 06/14/2015   Procedure: CYSTOSCOPY WITH RETROGRADE PYELOGRAM, cysto litholapexy ;  Surgeon: Alexis Frock, MD;  Location: [facility];  Service: Urology;  Laterality: N/A;    . CYSTOSCOPY/URETEROSCOPY/HOLMIUM LASER/STENT PLACEMENT Left 01/07/2017   Procedure: CYSTOSCOPY/RETROGRADE/URETEROSCOPY/HOLMIUM LASER/STENT PLACEMENT, FULGERATION OF PROSTATIC URETHRA;  Surgeon: Ceasar Mons, MD;  Location: [facility];  Service: Urology;  Laterality: Left;  . HOLMIUM LASER APPLICATION N/A 05/13/1935   Procedure: HOLMIUM LASER APPLICATION bladder stone;  Surgeon: Alexis Frock, MD;  Location: [facility];  Service: Urology;  Laterality: N/A;  . HOLMIUM LASER APPLICATION Left 90/24/0973   Procedure: HOLMIUM LASER APPLICATION;  Surgeon: Ceasar Mons, MD;  Location: [facility];  Service: Urology;  Laterality: Left;  . NEPHROLITHOTOMY Left 09/29/2013   Procedure: NEPHROLITHOTOMY PERCUTANEOUS WITH ACCESS;  Surgeon: Alexis Frock, MD;  Location: [facility];  Service: Urology;  Laterality: Left;  . URETEROSCOPY N/A 09/29/2013   Procedure: URETEROSCOPY;  Surgeon: Alexis Frock, MD;  Location: [facility];  Service: Urology;  Laterality: N/A;    Social History   Socioeconomic History  . Marital status: Married    Spouse name: Not on file  . Number of children: Not on file  . Years of education: Not on file  . Highest education level: Not on file  Social Needs  . Financial resource strain: Not on file  . Food insecurity - worry: Not on file  . Food insecurity - inability: Not on file  . Transportation needs - medical: Not on file  . Transportation needs - non-medical: Not on file  Occupational History  . Not on file  Tobacco Use  . Smoking status: Former Smoker    Years: 45.00    Types: Cigarettes    Last attempt to quit: 03/12/2007    Years since quitting: 10.0  . Smokeless tobacco: Never Used  Substance and Sexual Activity  . Alcohol use: No  . Drug use: No  . 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 Not on file  Social  History Narrative   married -70 yrs  [name] [place])   grew up in [place]   He has one son - two grandaugthers     He is retired.  He had     a 40 pack-year smoking history but has discontinued.     prev occuptation -  [employer]   Alcohol use-no   Smoking Status:  quit   Packs/Day:  0.5   Caffeine use/day:  2 beverages daily   Does Patient Exercise:  yes             Family History  Problem Relation Age of Onset  . Coronary artery disease Father        in his 83's  . Hyperlipidemia Father   . Hypertension Father   . Hearing loss Father   . Heart disease Mother        deceased of heart disease  . Hypertension Mother   . Hyperlipidemia Mother   .  Multiple sclerosis Son   . Heart disease Maternal Grandmother   . Heart disease Maternal Grandfather   . Heart disease Paternal Grandmother   . Heart disease Paternal Grandfather   . GER disease Sister   . Other Neg Hx        no lung cancer, colon cancer, or prostate  . Colon cancer Neg Hx     ROS: no fevers or chills, productive cough, hemoptysis, dysphasia, odynophagia, melena, hematochezia, dysuria, hematuria, rash, seizure activity, orthopnea, PND, pedal edema, claudication. Remaining systems are negative.  Physical Exam: Well-developed well-nourished in no acute distress.  Skin is warm and dry.  HEENT is normal.  Neck is supple.  Chest is clear to auscultation with normal expansion.  Cardiovascular exam is regular rate and rhythm.  Abdominal exam nontender or distended. No masses palpated. Extremities show no edema. neuro grossly intact  ECG-sinus rhythm at a rate of 66.  First-degree AV block.  Personally reviewed  A/P  1 coronary artery disease-patient doing well no chest pain.  Continue aspirin and statin.  Continue lifestyle modification.  2 hyperlipidemia-continue statin.  Most recent laboratories from August 2018 personally reviewed.  LDL 27 and liver functions normal.  3 hypertension-blood  pressure is borderline but controlled at home by his report.  Continue present medications and follow.  4 carotid artery disease-most recent Doppler showed 1-39% bilateral stenosis.  Continue medical therapy with aspirin and statin.  Kirk Ruths, MD

## 2017-04-02 ENCOUNTER — Encounter: Payer: Self-pay | Admitting: Cardiology

## 2017-04-02 ENCOUNTER — Ambulatory Visit (INDEPENDENT_AMBULATORY_CARE_PROVIDER_SITE_OTHER): Payer: Medicare Other | Admitting: Cardiology

## 2017-04-02 VITALS — BP 146/76 | HR 73 | Ht 65.0 in | Wt 174.0 lb

## 2017-04-02 DIAGNOSIS — E7800 Pure hypercholesterolemia, unspecified: Secondary | ICD-10-CM

## 2017-04-02 DIAGNOSIS — I10 Essential (primary) hypertension: Secondary | ICD-10-CM | POA: Diagnosis not present

## 2017-04-02 DIAGNOSIS — I2510 Atherosclerotic heart disease of native coronary artery without angina pectoris: Secondary | ICD-10-CM

## 2017-04-02 MED ORDER — METOPROLOL TARTRATE 25 MG PO TABS: 12.5000 mg | ORAL_TABLET | Freq: Two times a day (BID) | ORAL | 3 refills | Status: DC

## 2017-04-02 MED ORDER — ROSUVASTATIN CALCIUM 40 MG PO TABS: 40.0000 mg | ORAL_TABLET | Freq: Every day | ORAL | 3 refills | Status: DC

## 2017-04-02 NOTE — Patient Instructions (Signed)
Your physician wants you to follow-up in: ONE YEAR WITH DR CRENSHAW You will receive a reminder letter in the mail two months in advance. If you don't receive a letter, please call our office to schedule the follow-up appointment.   If you need a refill on your cardiac medications before your next appointment, please call your pharmacy.  

## 2017-04-16 NOTE — Progress Notes (Signed)
Subjective:   Anthony [name] is a 72 y.o. male who presents for Medicare Annual/Subsequent preventive examination.  Review of Systems:  No ROS.  Medicare Wellness Visit. Additional risk factors are reflected in the social history. Cardiac Risk Factors include: advanced age (>21men, >38 women);dyslipidemia;hypertension;male gender Sleep patterns: Sleeps 6-8 hrs. Feels rested.  Home Safety/Smoke Alarms: Feels safe in home. Smoke alarms in place.  Living environment; residence and Firearm Safety: Lives with wife. 1 story home.  Seat Belt Safety/Bike Helmet: Wears seat belt.  Male:   CCS- last 06/10/16.  Recall 3 yrs.  PSA-  Lab Results  Component Value Date   PSA 3.32 03/24/2013   PSA 2.46 02/12/2012   PSA 1.61 02/13/2011       Objective:    Vitals: BP 136/64 (BP Location: Left Arm, Patient Position: Sitting, Cuff Size: Normal)   Pulse (!) 59   Ht 5\' 5"  (1.651 m)   Wt 175 lb 9.6 oz (79.7 kg)   SpO2 97%   BMI 29.22 kg/m   Body mass index is 29.22 kg/m.  Advanced Directives 04/18/2017 01/07/2017 01/03/2017 06/10/2016 04/16/2016 06/14/2015 06/09/2015  Does Patient Have a Medical Advance Directive? Yes Yes No Yes Yes Yes Yes  Type of Paramedic of [place];Living will [place];Living will - - [place];Living will - [place];Living will  Does patient want to make changes to medical advance directive? No - Patient declined No - Patient declined - - No - Patient declined - -  Copy of [place] in Chart? Yes No - copy requested - - No - copy requested No - copy requested No - copy requested  Pre-existing out of facility DNR order (yellow form or pink MOST form) - - - - - - -    Tobacco Social History   Tobacco Use  Smoking Status Former Smoker  . Years: 45.00  . Types: Cigarettes  . Last attempt to quit: 03/12/2007  . Years since quitting: 10.1  Smokeless Tobacco Never Used       Counseling given: Not Answered   Clinical Intake: Pain : No/denies pain   Past Medical History:  Diagnosis Date  . Allergic rhinitis   . BPH (benign prostatic hyperplasia)   . CAD (coronary artery disease) 05/26/2009 :  primary cardiolgoist-  dr Stanford Breed   hx NSTEMI cardiac catherization  revealed-left main normal; LAD proximal calcification,  long proximal 30% stenosis,  Circumflex  AV groove   proximal  60% stenosis at mid obtuse marginal,  first large mid obtuse marginal  ostial 70% stenosis,  prox. to mid RCA  99% stenosis w/ intervention PCI and DES  . Carotid stenosis, asymptomatic, bilateral    per last duplex 03-31-2015  bilateral ICA 1-39%  . External hemorrhoids    large external  . GERD (gastroesophageal reflux disease)   . History of adenomatous polyp of colon   . History of kidney stones   . History of non-ST elevation myocardial infarction (NSTEMI) 05/26/2009  . Hyperlipidemia   . Hypertension    8-10 years  . Left ureteral stone   . Macular degeneration   . Nephrolithiasis    right side non-obstructive per CT 01-03-2017  . S/P drug eluting coronary stent placement 05/26/2009   DESx1  to pRCA  . Wears glasses    Past Surgical History:  Procedure Laterality Date  . CARDIOVASCULAR STRESS TEST  12/26/2011   dr Stanford Breed   normal nuclear  study w/ no ischemia/  normal LV function and wall motion , ef 71%  . CATARACT EXTRACTION W/ INTRAOCULAR LENS  IMPLANT, BILATERAL  2010  . COLONOSCOPY  last one 06-10-2016  . CORONARY ANGIOPLASTY WITH STENT PLACEMENT  05-26-2009  dr hochrein/ dr Darnell Level brodie   Severe RCA stenosis and moderate stenosis in the left system, ef 50% with inferior hypokinsis:  PCI and DES x1 to RCA (Promus)  . CYSTOSCOPY W/ RETROGRADES Bilateral 09/29/2013   Procedure: CYSTOSCOPY WITH RETROGRADE PYELOGRAM;  Surgeon: Alexis Frock, MD;  Location: [facility];  Service: Urology;  Laterality: Bilateral;  . CYSTOSCOPY WITH RETROGRADE PYELOGRAM, URETEROSCOPY AND  STENT PLACEMENT N/A 06/14/2015   Procedure: CYSTOSCOPY WITH RETROGRADE PYELOGRAM, cysto litholapexy ;  Surgeon: Alexis Frock, MD;  [facility];  Service: Urology;  Laterality: N/A;  . CYSTOSCOPY/URETEROSCOPY/HOLMIUM LASER/STENT PLACEMENT Left 01/07/2017   Procedure: CYSTOSCOPY/RETROGRADE/URETEROSCOPY/HOLMIUM LASER/STENT PLACEMENT, FULGERATION OF PROSTATIC URETHRA;  Surgeon: Ceasar Mons, MD;  Location: [facility];  Service: Urology;  Laterality: Left;  . HOLMIUM LASER APPLICATION N/A 05/12/1960   Procedure: HOLMIUM LASER APPLICATION bladder stone;  Surgeon: Alexis Frock, MD;  Location: [facility];  Service: Urology;  Laterality: N/A;  . HOLMIUM LASER APPLICATION Left 22/97/9892   Procedure: HOLMIUM LASER APPLICATION;  Surgeon: Ceasar Mons, MD;  Location: [facility];  Service: Urology;  Laterality: Left;  . NEPHROLITHOTOMY Left 09/29/2013   Procedure: NEPHROLITHOTOMY PERCUTANEOUS WITH ACCESS;  Surgeon: Alexis Frock, MD;  Location: [facility];  Service: Urology;  Laterality: Left;  . URETEROSCOPY N/A 09/29/2013   Procedure: URETEROSCOPY;  Surgeon: Alexis Frock, MD;  Location: [facility];  Service: Urology;  Laterality: N/A;   Family History  Problem Relation Age of Onset  . Coronary artery disease Father        in his 56's  . Hyperlipidemia Father   . Hypertension Father   . Hearing loss Father   . Heart disease Mother        deceased of heart disease  . Hypertension Mother   . Hyperlipidemia Mother   . Multiple sclerosis Son   . Heart disease Maternal Grandmother   . Heart disease Maternal Grandfather   . Heart disease Paternal Grandmother   . Heart disease Paternal Grandfather   . GER disease Sister   . Other Neg Hx        no lung cancer, colon cancer, or prostate  . Colon cancer Neg Hx    Social History   Socioeconomic History  . Marital status: Married    Spouse name: None  . Number  of children: None  . Years of education: None  . Highest education level: None  Social Needs  . Financial resource strain: None  . Food insecurity - worry: None  . Food insecurity - inability: None  . Transportation needs - medical: None  . Transportation needs - non-medical: None  Occupational History  . None  Tobacco Use  . Smoking status: Former Smoker    Years: 45.00    Types: Cigarettes    Last attempt to quit: 03/12/2007    Years since quitting: 10.1  . Smokeless tobacco: Never Used  Substance and Sexual Activity  . Alcohol use: No  . Drug use: No  . 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 None  Social History Narrative   married -25 yrs  [name] [place])  grew up in [place]   He has one son - two grandaugthers     He is retired.  He had     a 40 pack-year smoking history but has discontinued.     prev occuptation -  [employer]   Alcohol use-no   Smoking Status:  quit   Packs/Day:  0.5   Caffeine use/day:  2 beverages daily   Does Patient Exercise:  yes             Outpatient Encounter Medications as of 04/18/2017  Medication Sig  . amLODipine (NORVASC) 10 MG tablet TAKE 1 TABLET BY MOUTH  EVERY MORNING  . aspirin 81 MG tablet Take 81 mg by mouth every morning.   . calcium carbonate (TUMS - DOSED IN MG ELEMENTAL CALCIUM) 500 MG chewable tablet Chew 1 tablet by mouth as needed for indigestion or heartburn.  . losartan (COZAAR) 100 MG tablet TAKE 1 TABLET BY MOUTH  EVERY MORNING  . metoprolol tartrate (LOPRESSOR) 25 MG tablet Take 0.5 tablets (12.5 mg total) by mouth 2 (two) times daily.  . Multiple Vitamins-Minerals (ICAPS PO) Take by mouth every morning.   . potassium citrate (UROCIT-K) 10 MEQ (1080 MG) SR tablet takes one tablet twice daily  . rosuvastatin (CRESTOR) 40 MG tablet Take 1 tablet (40 mg total) by mouth daily.   No facility-administered encounter medications on  file as of 04/18/2017.     Activities of Daily Living In your present state of health, do you have any difficulty performing the following activities: 04/18/2017 01/07/2017  Hearing? N N  Vision? N N  Comment wears glasses. Eye doctor yearly. Hx cataract sx. -  Difficulty concentrating or making decisions? N N  Walking or climbing stairs? N N  Dressing or bathing? N N  Doing errands, shopping? N -  Preparing Food and eating ? N -  Using the Toilet? N -  In the past six months, have you accidently leaked urine? N -  Do you have problems with loss of bowel control? N -  Managing your Medications? N -  Managing your Finances? N -  Housekeeping or managing your Housekeeping? N -  Some recent data might be hidden    Patient Care Team: [name], MD as PCP - General (Family Medicine) [place], Jennefer Bravo, MD as Referring Physician (Dermatology) Alexis Frock, MD as Consulting Physician (Urology) Gerarda Fraction, MD as Referring Physician (Ophthalmology) Lelon Perla, MD as Consulting Physician (Cardiology) Ceasar Mons, MD as Consulting Physician (Urology)   Assessment:   This is a routine wellness examination for Williams. Physical assessment deferred to PCP  Exercise Activities and Dietary recommendations Current Exercise Habits: Home exercise routine, Type of exercise: treadmill, Time (Minutes): 35, Frequency (Times/Week): 7, Weekly Exercise (Minutes/Week): 245, Intensity: Mild   Diet (meal preparation, eat out, water intake, caffeinated beverages, dairy products, fruits and vegetables):  24 hour recall Breakfast: healthy cereal. 1-2cups coffee Lunch: tuna salad and fruit. Water  Dinner: baked ham and potatoes. Tea 4-5 glasses of water per day  Goals    . Maintain current active lifestyle.  (pt-stated)       Fall Risk Fall Risk  04/18/2017 04/16/2016 04/14/2015 04/05/2014 04/05/2013  Falls in the past year? No No No No No    Depression Screen PHQ 2/9  Scores 04/18/2017 04/16/2016 04/16/2016 04/14/2015  PHQ - 2 Score 0 0 0 0    Cognitive Function MMSE - Mini Mental State Exam 04/18/2017  Orientation to  time 5  Orientation to Place 5  Registration 3  Attention/ Calculation 5  Recall 3  Language- name 2 objects 2  Language- repeat 1  Language- follow 3 step command 3  Language- read & follow direction 1  Write a sentence 1  Copy design 1  Total score 30        Immunization History  Administered Date(s) Administered  . Influenza Split 12/07/2010, 11/20/2011, 12/05/2011  . Influenza Whole 01/06/2009, 11/20/2009  . Influenza, High Dose Seasonal PF 12/06/2015, 12/17/2016  . Influenza,inj,Quad PF,6+ Mos 12/09/2012, 12/15/2013, 11/30/2014  . Pneumococcal Conjugate-13 04/14/2015  . Pneumococcal Polysaccharide-23 05/11/2009, 12/17/2016  . Td 10/27/2007  . Zoster 05/30/2008    Screening Tests Health Maintenance  Topic Date Due  . TETANUS/TDAP  10/26/2017  . COLONOSCOPY  06/11/2019  . INFLUENZA VACCINE  Completed  . Hepatitis C Screening  Completed  . PNA vac Low Risk Adult  Completed    Plan:   Follow up with Dr.Blyth today as scheduled.  Continue to eat heart healthy diet (full of fruits, vegetables, whole grains, lean protein, water--limit salt, fat, and sugar intake) and increase physical activity as tolerated.  Continue doing brain stimulating activities (puzzles, reading, adult coloring books, staying active) to keep memory sharp.     I have personally reviewed and noted the following in the patient's chart:   . Medical and social history . Use of alcohol, tobacco or illicit drugs  . Current medications and supplements . Functional ability and status . Nutritional status . Physical activity . Advanced directives . List of other physicians . Hospitalizations, surgeries, and ER visits in previous 12 months . Vitals . Screenings to include cognitive, depression, and falls . Referrals and appointments  In addition, I  have reviewed and discussed with patient certain preventive protocols, quality metrics, and best practice recommendations. A written personalized care plan for preventive services as well as general preventive health recommendations were provided to patient.     Shela Nevin, [place]  04/18/2017

## 2017-04-18 ENCOUNTER — Ambulatory Visit (INDEPENDENT_AMBULATORY_CARE_PROVIDER_SITE_OTHER): Payer: Medicare Other | Admitting: Family Medicine

## 2017-04-18 ENCOUNTER — Encounter: Payer: Self-pay | Admitting: Family Medicine

## 2017-04-18 ENCOUNTER — Other Ambulatory Visit: Payer: Medicare Other

## 2017-04-18 ENCOUNTER — Encounter: Payer: Self-pay | Admitting: *Deleted

## 2017-04-18 ENCOUNTER — Ambulatory Visit (INDEPENDENT_AMBULATORY_CARE_PROVIDER_SITE_OTHER): Payer: Medicare Other | Admitting: *Deleted

## 2017-04-18 VITALS — BP 136/64 | HR 59 | Ht 65.0 in | Wt 175.6 lb

## 2017-04-18 DIAGNOSIS — Z0000 Encounter for general adult medical examination without abnormal findings: Secondary | ICD-10-CM

## 2017-04-18 DIAGNOSIS — R739 Hyperglycemia, unspecified: Secondary | ICD-10-CM | POA: Diagnosis not present

## 2017-04-18 DIAGNOSIS — I10 Essential (primary) hypertension: Secondary | ICD-10-CM

## 2017-04-18 DIAGNOSIS — E782 Mixed hyperlipidemia: Secondary | ICD-10-CM

## 2017-04-18 DIAGNOSIS — I2510 Atherosclerotic heart disease of native coronary artery without angina pectoris: Secondary | ICD-10-CM | POA: Diagnosis not present

## 2017-04-18 LAB — COMPREHENSIVE METABOLIC PANEL
ALBUMIN: 4.4 g/dL (ref 3.5–5.2)
ALT: 19 U/L (ref 0–53)
AST: 16 U/L (ref 0–37)
Alkaline Phosphatase: 67 U/L (ref 39–117)
BUN: 12 mg/dL (ref 6–23)
CALCIUM: 9.3 mg/dL (ref 8.4–10.5)
CHLORIDE: 103 meq/L (ref 96–112)
CO2: 30 mEq/L (ref 19–32)
Creatinine, Ser: 0.85 mg/dL (ref 0.40–1.50)
GFR: 94.22 mL/min (ref >60.00)
Glucose, Bld: 96 mg/dL (ref 70–99)
POTASSIUM: 4.3 meq/L (ref 3.5–5.1)
SODIUM: 139 meq/L (ref 135–145)
Total Bilirubin: 0.8 mg/dL (ref 0.2–1.2)
Total Protein: 7.6 g/dL (ref 6.0–8.3)

## 2017-04-18 LAB — LIPID PANEL
CHOLESTEROL: 90 mg/dL (ref 0–200)
HDL: 34.5 mg/dL — ABNORMAL LOW (ref >39.00)
LDL CALC: 25 mg/dL (ref 0–99)
NonHDL: 55.88
TRIGLYCERIDES: 154 mg/dL — AB (ref 0.0–149.0)
Total CHOL/HDL Ratio: 3
VLDL: 30.8 mg/dL (ref 0.0–40.0)

## 2017-04-18 LAB — CBC
HCT: 42.5 % (ref 38.5–50.0)
HEMOGLOBIN: 14.6 g/dL (ref 13.2–17.1)
MCH: 31.5 pg (ref 27.0–33.0)
MCHC: 34.4 g/dL (ref 32.0–36.0)
MCV: 91.6 fL (ref 80.0–100.0)
MPV: 9.8 fL (ref 7.5–12.5)
Platelets: 274 10*3/uL (ref 140–400)
RBC: 4.64 10*6/uL (ref 4.20–5.80)
RDW: 12.7 % (ref 11.0–15.0)
WBC: 7.4 10*3/uL (ref 3.8–10.8)

## 2017-04-18 LAB — TSH: TSH: 3.2 u[IU]/mL (ref 0.35–4.50)

## 2017-04-18 LAB — HEMOGLOBIN A1C: Hgb A1c MFr Bld: 6.1 % (ref 4.6–6.5)

## 2017-04-18 MED ORDER — ZOSTER VAC RECOMB ADJUVANTED 50 MCG/0.5ML IM SUSR: 0.5000 mL | Freq: Once | INTRAMUSCULAR | 1 refills | Status: AC

## 2017-04-18 NOTE — Assessment & Plan Note (Signed)
Tolerating statin, encouraged heart healthy diet, avoid trans fats, minimize simple carbs and saturated fats. Increase exercise as tolerated 

## 2017-04-18 NOTE — Assessment & Plan Note (Signed)
hgba1c acceptable, minimize simple carbs. Increase exercise as tolerated.  

## 2017-04-18 NOTE — Patient Instructions (Addendum)
Shingrix is the new shingles shot 2 shots over 2-6 months at pharmacy  Carbohydrate Counting for Diabetes Mellitus, Adult Carbohydrate counting is a method for keeping track of how many carbohydrates you eat. Eating carbohydrates naturally increases the amount of sugar (glucose) in the blood. Counting how many carbohydrates you eat helps keep your blood glucose within normal limits, which helps you manage your diabetes (diabetes mellitus). It is important to know how many carbohydrates you can safely have in each meal. This is different for every person. A diet and nutrition specialist (registered dietitian) can help you make a meal plan and calculate how many carbohydrates you should have at each meal and snack. Carbohydrates are found in the following foods:  Grains, such as breads and cereals.  Dried beans and soy products.  Starchy vegetables, such as potatoes, peas, and corn.  Fruit and fruit juices.  Milk and yogurt.  Sweets and snack foods, such as cake, cookies, candy, chips, and soft drinks.  How do I count carbohydrates? There are two ways to count carbohydrates in food. You can use either of the methods or a combination of both. Reading "Nutrition Facts" on packaged food The "Nutrition Facts" list is included on the labels of almost all packaged foods and beverages in the [place] It includes:  The serving size.  Information about nutrients in each serving, including the grams (g) of carbohydrate per serving.  To use the "Nutrition Facts":  Decide how many servings you will have.  Multiply the number of servings by the number of carbohydrates per serving.  The resulting number is the total amount of carbohydrates that you will be having.  Learning standard serving sizes of other foods When you eat foods containing carbohydrates that are not packaged or do not include "Nutrition Facts" on the label, you need to measure the servings in order to count the amount of  carbohydrates:  Measure the foods that you will eat with a food scale or measuring cup, if needed.  Decide how many standard-size servings you will eat.  Multiply the number of servings by 15. Most carbohydrate-rich foods have about 15 g of carbohydrates per serving. ? For example, if you eat 8 oz (170 g) of strawberries, you will have eaten 2 servings and 30 g of carbohydrates (2 servings x 15 g = 30 g).  For foods that have more than one food mixed, such as soups and casseroles, you must count the carbohydrates in each food that is included.  The following list contains standard serving sizes of common carbohydrate-rich foods. Each of these servings has about 15 g of carbohydrates:   hamburger bun or  English muffin.   oz (15 mL) syrup.   oz (14 g) jelly.  1 slice of bread.  1 six-inch tortilla.  3 oz (85 g) cooked rice or pasta.  4 oz (113 g) cooked dried beans.  4 oz (113 g) starchy vegetable, such as peas, corn, or potatoes.  4 oz (113 g) hot cereal.  4 oz (113 g) mashed potatoes or  of a large baked potato.  4 oz (113 g) canned or frozen fruit.  4 oz (120 mL) fruit juice.  4-6 crackers.  6 chicken nuggets.  6 oz (170 g) unsweetened dry cereal.  6 oz (170 g) plain fat-free yogurt or yogurt sweetened with artificial sweeteners.  8 oz (240 mL) milk.  8 oz (170 g) fresh fruit or one small piece of fruit.  24 oz (680 g) popped popcorn.    Example of carbohydrate counting Sample meal  3 oz (85 g) chicken breast.  6 oz (170 g) brown rice.  4 oz (113 g) corn.  8 oz (240 mL) milk.  8 oz (170 g) strawberries with sugar-free whipped topping. Carbohydrate calculation 1. Identify the foods that contain carbohydrates: ? Rice. ? Corn. ? Milk. ? Strawberries. 2. Calculate how many servings you have of each food: ? 2 servings rice. ? 1 serving corn. ? 1 serving milk. ? 1 serving strawberries. 3. Multiply each number of servings by 15 g: ? 2 servings  rice x 15 g = 30 g. ? 1 serving corn x 15 g = 15 g. ? 1 serving milk x 15 g = 15 g. ? 1 serving strawberries x 15 g = 15 g. 4. Add together all of the amounts to find the total grams of carbohydrates eaten: ? 30 g + 15 g + 15 g + 15 g = 75 g of carbohydrates total. This information is not intended to replace advice given to you by your health care provider. Make sure you discuss any questions you have with your health care provider. Document Released: 02/25/2005 Document Revised: 09/15/2015 Document Reviewed: 08/09/2015 Elsevier Interactive Patient Education  2018 Elsevier Inc.  

## 2017-04-18 NOTE — Progress Notes (Signed)
Subjective:  I acted as a Education administrator for Dr. Charlett Blake. [name], [place]  Patient ID: Anthony [name], male    DOB: 17-Jan-1946, 72 y.o.   MRN: [id number]  No chief complaint on file.   HPI  Patient is in today for follow up on chronic medical concerns. He feels well today. No recent illness or hospitalizations. He notes his cough has resolved and he feels the cough resolved when he started exercising more. He is up to 35 minutes on his tread mill daily. He denies any difficulty with ADLs. He is maintaining a heart healthy diet. He denies any polyuria or polydipsia. Denies CP/palp/SOB/HA/congestion/fevers/GI or GU c/o. Taking meds as prescribed  Patient Care Team: [name], MD as PCP - General (Family Medicine) Renda Rolls, Jennefer Bravo, MD as Referring Physician (Dermatology) Alexis Frock, MD as Consulting Physician (Urology) Gerarda Fraction, MD as Referring Physician (Ophthalmology) Lelon Perla, MD as Consulting Physician (Cardiology) Ceasar Mons, MD as Consulting Physician (Urology)   Past Medical History:  Diagnosis Date  . Allergic rhinitis   . BPH (benign prostatic hyperplasia)   . CAD (coronary artery disease) 05/26/2009 :  primary cardiolgoist-  dr Stanford Breed   hx NSTEMI cardiac catherization  revealed-left main normal; LAD proximal calcification,  long proximal 30% stenosis,  Circumflex  AV groove   proximal  60% stenosis at mid obtuse marginal,  first large mid obtuse marginal  ostial 70% stenosis,  prox. to mid RCA  99% stenosis w/ intervention PCI and DES  . Carotid stenosis, asymptomatic, bilateral    per last duplex 03-31-2015  bilateral ICA 1-39%  . External hemorrhoids    large external  . GERD (gastroesophageal reflux disease)   . History of adenomatous polyp of colon   . History of kidney stones   . History of non-ST elevation myocardial infarction (NSTEMI) 05/26/2009  . Hyperlipidemia   . Hypertension    8-10 years  . Left ureteral stone   .  Macular degeneration   . Nephrolithiasis    right side non-obstructive per CT 01-03-2017  . S/P drug eluting coronary stent placement 05/26/2009   DESx1  to pRCA  . Wears glasses     Past Surgical History:  Procedure Laterality Date  . CARDIOVASCULAR STRESS TEST  12/26/2011   dr Stanford Breed   normal nuclear study w/ no ischemia/  normal LV function and wall motion , ef 71%  . CATARACT EXTRACTION W/ INTRAOCULAR LENS  IMPLANT, BILATERAL  2010  . COLONOSCOPY  last one 06-10-2016  . CORONARY ANGIOPLASTY WITH STENT PLACEMENT  05-26-2009  dr hochrein/ dr Darnell Level brodie   Severe RCA stenosis and moderate stenosis in the left system, ef 50% with inferior hypokinsis:  PCI and DES x1 to RCA ([facility])  . CYSTOSCOPY W/ RETROGRADES Bilateral 09/29/2013   Procedure: CYSTOSCOPY WITH RETROGRADE PYELOGRAM;  Surgeon: Alexis Frock, MD;  Location: [facility];  Service: Urology;  Laterality: Bilateral;  . CYSTOSCOPY WITH RETROGRADE PYELOGRAM, URETEROSCOPY AND STENT PLACEMENT N/A 06/14/2015   Procedure: CYSTOSCOPY WITH RETROGRADE PYELOGRAM, cysto litholapexy ;  Surgeon: Alexis Frock, MD;  Location: [facility];  Service: Urology;  Laterality: N/A;  . CYSTOSCOPY/URETEROSCOPY/HOLMIUM LASER/STENT PLACEMENT Left 01/07/2017   Procedure: CYSTOSCOPY/RETROGRADE/URETEROSCOPY/HOLMIUM LASER/STENT PLACEMENT, FULGERATION OF PROSTATIC URETHRA;  Surgeon: Ceasar Mons, MD;  Location: [facility];  Service: Urology;  Laterality: Left;  . HOLMIUM LASER APPLICATION N/A 07/11/9765   Procedure: HOLMIUM LASER APPLICATION bladder stone;  Surgeon: Alexis Frock, MD;  Location: [facility];  Service: Urology;  Laterality: N/A;  . HOLMIUM LASER APPLICATION Left 62/69/4854   Procedure: HOLMIUM LASER APPLICATION;  Surgeon: Ceasar Mons, MD;  Location: [facility];  Service: Urology;  Laterality: Left;  . NEPHROLITHOTOMY Left 09/29/2013   Procedure: NEPHROLITHOTOMY  PERCUTANEOUS WITH ACCESS;  Surgeon: Alexis Frock, MD;  Location: [facility];  Service: Urology;  Laterality: Left;  . URETEROSCOPY N/A 09/29/2013   Procedure: URETEROSCOPY;  Surgeon: Alexis Frock, MD;  Location: [facility];  Service: Urology;  Laterality: N/A;    Family History  Problem Relation Age of Onset  . Coronary artery disease Father        in his 43's  . Hyperlipidemia Father   . Hypertension Father   . Hearing loss Father   . Heart disease Mother        deceased of heart disease  . Hypertension Mother   . Hyperlipidemia Mother   . Multiple sclerosis Son   . Heart disease Maternal Grandmother   . Heart disease Maternal Grandfather   . Heart disease Paternal Grandmother   . Heart disease Paternal Grandfather   . GER disease Sister   . Other Neg Hx        no lung cancer, colon cancer, or prostate  . Colon cancer Neg Hx     Social History   Socioeconomic History  . Marital status: Married    Spouse name: Not on file  . Number of children: Not on file  . Years of education: Not on file  . Highest education level: Not on file  Social Needs  . Financial resource strain: Not on file  . Food insecurity - worry: Not on file  . Food insecurity - inability: Not on file  . Transportation needs - medical: Not on file  . Transportation needs - non-medical: Not on file  Occupational History  . Not on file  Tobacco Use  . Smoking status: Former Smoker    Years: 45.00    Types: Cigarettes    Last attempt to quit: 03/12/2007    Years since quitting: 10.1  . Smokeless tobacco: Never Used  Substance and Sexual Activity  . Alcohol use: No  . Drug use: No  . 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 Not on file  Social History Narrative   married -85 yrs  [name] [place])   grew up in [place]   He has one son - two grandaugthers     He is retired.  He had     a 40 pack-year smoking history but has  discontinued.     prev occuptation -  [employer]   Alcohol use-no   Smoking Status:  quit   Packs/Day:  0.5   Caffeine use/day:  2 beverages daily   Does Patient Exercise:  yes             Outpatient Medications Prior to Visit  Medication Sig Dispense Refill  . amLODipine (NORVASC) 10 MG tablet TAKE 1 TABLET BY MOUTH  EVERY MORNING 90 tablet 0  . aspirin 81 MG tablet Take 81 mg by mouth every morning.     . calcium carbonate (TUMS - DOSED IN MG ELEMENTAL CALCIUM) 500 MG chewable tablet Chew 1 tablet by mouth as needed for indigestion or heartburn.    . losartan (COZAAR) 100 MG tablet TAKE 1 TABLET BY MOUTH  EVERY MORNING 90 tablet  0  . metoprolol tartrate (LOPRESSOR) 25 MG tablet Take 0.5 tablets (12.5 mg total) by mouth 2 (two) times daily. 180 tablet 3  . Multiple Vitamins-Minerals (ICAPS PO) Take by mouth every morning.     . potassium citrate (UROCIT-K) 10 MEQ (1080 MG) SR tablet takes one tablet twice daily  3  . rosuvastatin (CRESTOR) 40 MG tablet Take 1 tablet (40 mg total) by mouth daily. 90 tablet 3   No facility-administered medications prior to visit.     No Known Allergies  Review of Systems  Constitutional: Negative for fever and malaise/fatigue.  HENT: Negative for congestion.   Eyes: Negative for blurred vision.  Respiratory: Negative for cough and shortness of breath.   Cardiovascular: Negative for chest pain, palpitations and leg swelling.  Gastrointestinal: Negative for vomiting.  Musculoskeletal: Negative for back pain.  Skin: Negative for rash.  Neurological: Negative for loss of consciousness and headaches.       Objective:    Physical Exam  Constitutional: He is oriented to person, place, and time. He appears well-developed and well-nourished. No distress.  HENT:  Head: Normocephalic and atraumatic.  Eyes: Conjunctivae are normal.  Neck: Normal range of motion. No thyromegaly present.  Cardiovascular: Normal rate and regular rhythm.    Pulmonary/Chest: Effort normal and breath sounds normal. He has no wheezes.  Abdominal: Soft. Bowel sounds are normal. There is no tenderness.  Musculoskeletal: Normal range of motion. He exhibits no edema or deformity.  Neurological: He is alert and oriented to person, place, and time.  Skin: Skin is warm and dry. He is not diaphoretic.  Psychiatric: He has a normal mood and affect.    BP 136/64 (BP Location: Left Arm, Patient Position: Sitting, Cuff Size: Normal)   Pulse (!) 59   Resp 18   Wt 175 lb 9.6 oz (79.7 kg)   SpO2 97%   BMI 29.22 kg/m  Wt Readings from Last 3 Encounters:  04/18/17 175 lb 9.6 oz (79.7 kg)  04/18/17 175 lb 9.6 oz (79.7 kg)  04/02/17 174 lb (78.9 kg)   BP Readings from Last 3 Encounters:  04/18/17 136/64  04/18/17 136/64  04/02/17 (!) 146/76     Immunization History  Administered Date(s) Administered  . Influenza Split 12/07/2010, 11/20/2011, 12/05/2011  . Influenza Whole 01/06/2009, 11/20/2009  . Influenza, High Dose Seasonal PF 12/06/2015, 12/17/2016  . Influenza,inj,Quad PF,6+ Mos 12/09/2012, 12/15/2013, 11/30/2014  . Pneumococcal Conjugate-13 04/14/2015  . Pneumococcal Polysaccharide-23 05/11/2009, 12/17/2016  . Td 10/27/2007  . Zoster 05/30/2008    Health Maintenance  Topic Date Due  . Samul Dada  10/26/2017  . COLONOSCOPY  06/11/2019  . INFLUENZA VACCINE  Completed  . Hepatitis C Screening  Completed  . PNA vac Low Risk Adult  Completed    Lab Results  Component Value Date   WBC 7.6 10/17/2016   HGB 14.6 01/07/2017   HCT 43.0 01/07/2017   PLT 238.0 10/17/2016   GLUCOSE 92 01/07/2017   CHOL 89 10/17/2016   TRIG 140.0 10/17/2016   HDL 34.00 (L) 10/17/2016   LDLCALC 27 10/17/2016   ALT 17 10/17/2016   AST 14 10/17/2016   NA 141 01/07/2017   K 3.8 01/07/2017   CL 104 01/07/2017   CREATININE 0.80 01/07/2017   BUN 12 01/07/2017   CO2 29 10/17/2016   TSH 2.48 10/17/2016   PSA 3.32 03/24/2013   INR 1.05 05/25/2009   HGBA1C  6.2 10/17/2016    Lab Results  Component Value Date  TSH 2.48 10/17/2016   Lab Results  Component Value Date   WBC 7.6 10/17/2016   HGB 14.6 01/07/2017   HCT 43.0 01/07/2017   MCV 94.9 10/17/2016   PLT 238.0 10/17/2016   Lab Results  Component Value Date   NA 141 01/07/2017   K 3.8 01/07/2017   CO2 29 10/17/2016   GLUCOSE 92 01/07/2017   BUN 12 01/07/2017   CREATININE 0.80 01/07/2017   BILITOT 0.7 10/17/2016   ALKPHOS 74 10/17/2016   AST 14 10/17/2016   ALT 17 10/17/2016   PROT 7.5 10/17/2016   ALBUMIN 4.5 10/17/2016   CALCIUM 8.9 10/17/2016   ANIONGAP 12 09/30/2013   GFR 95.65 10/17/2016   Lab Results  Component Value Date   CHOL 89 10/17/2016   Lab Results  Component Value Date   HDL 34.00 (L) 10/17/2016   Lab Results  Component Value Date   LDLCALC 27 10/17/2016   Lab Results  Component Value Date   TRIG 140.0 10/17/2016   Lab Results  Component Value Date   CHOLHDL 3 10/17/2016   Lab Results  Component Value Date   HGBA1C 6.2 10/17/2016         Assessment & Plan:   Problem List Items Addressed This Visit    Hyperlipidemia, mixed    Tolerating statin, encouraged heart healthy diet, avoid trans fats, minimize simple carbs and saturated fats. Increase exercise as tolerated      Relevant Orders   Lipid panel   Essential hypertension    Well controlled, no changes to meds. Encouraged heart healthy diet such as the DASH diet and exercise as tolerated.       Relevant Orders   CBC   Comprehensive metabolic panel   TSH   Hyperglycemia    hgba1c acceptable, minimize simple carbs. Increase exercise as tolerated.       Relevant Orders   Hemoglobin A1c      I am having Anthony Limbo "Bobby" start on Zoster Vaccine Adjuvanted. I am also having him maintain his aspirin, Multiple Vitamins-Minerals (ICAPS PO), potassium citrate, calcium carbonate, amLODipine, losartan, metoprolol tartrate, and rosuvastatin.  Meds ordered this encounter    Medications  . Zoster Vaccine Adjuvanted [facility]) injection    Sig: Inject 0.5 mLs into the muscle once for 1 dose.    Dispense:  0.5 mL    Refill:  1    CMA served as scribe during this visit. History, Physical and Plan performed by medical provider. Documentation and orders reviewed and attested to.  Penni Homans, MD

## 2017-04-18 NOTE — Assessment & Plan Note (Signed)
Well controlled, no changes to meds. Encouraged heart healthy diet such as the DASH diet and exercise as tolerated.  °

## 2017-04-18 NOTE — Patient Instructions (Signed)
Continue to eat heart healthy diet (full of fruits, vegetables, whole grains, lean protein, water--limit salt, fat, and sugar intake) and increase physical activity as tolerated.  Continue doing brain stimulating activities (puzzles, reading, adult coloring books, staying active) to keep memory sharp.    Anthony [name] , Thank you for taking time to come for your Medicare Wellness Visit. I appreciate your ongoing commitment to your health goals. Please review the following plan we discussed and let me know if I can assist you in the future.   These are the goals we discussed: Goals    . Maintain current active lifestyle.  (pt-stated)       This is a list of the screening recommended for you and due dates:  Health Maintenance  Topic Date Due  . Tetanus Vaccine  10/26/2017  . Colon Cancer Screening  06/11/2019  . Flu Shot  Completed  .  Hepatitis C: One time screening is recommended by Center for Disease Control  (CDC) for  adults born from [birth year] through 1965.   Completed  . Pneumonia vaccines  Completed    Health Maintenance, Male A healthy lifestyle and preventive care is important for your health and wellness. Ask your health care provider about what schedule of regular examinations is right for you. What should I know about weight and diet? Eat a Healthy Diet  Eat plenty of vegetables, fruits, whole grains, low-fat dairy products, and lean protein.  Do not eat a lot of foods high in solid fats, added sugars, or salt.  Maintain a Healthy Weight Regular exercise can help you achieve or maintain a healthy weight. You should:  Do at least 150 minutes of exercise each week. The exercise should increase your heart rate and make you sweat (moderate-intensity exercise).  Do strength-training exercises at least twice a week.  Watch Your Levels of Cholesterol and Blood Lipids  Have your blood tested for lipids and cholesterol every 5 years starting at 72 years of age. If you are at high  risk for heart disease, you should start having your blood tested when you are 72 years old. You may need to have your cholesterol levels checked more often if: ? Your lipid or cholesterol levels are high. ? You are older than 72 years of age. ? You are at high risk for heart disease.  What should I know about cancer screening? Many types of cancers can be detected early and may often be prevented. Lung Cancer  You should be screened every year for lung cancer if: ? You are a current smoker who has smoked for at least 30 years. ? You are a former smoker who has quit within the past 15 years.  Talk to your health care provider about your screening options, when you should start screening, and how often you should be screened.  Colorectal Cancer  Routine colorectal cancer screening usually begins at 72 years of age and should be repeated every 5-10 years until you are 72 years old. You may need to be screened more often if early forms of precancerous polyps or small growths are found. Your health care provider may recommend screening at an earlier age if you have risk factors for colon cancer.  Your health care provider may recommend using home test kits to check for hidden blood in the stool.  A small camera at the end of a tube can be used to examine your colon (sigmoidoscopy or colonoscopy). This checks for the earliest forms of colorectal cancer.  Prostate and Testicular Cancer  Depending on your age and overall health, your health care provider may do certain tests to screen for prostate and testicular cancer.  Talk to your health care provider about any symptoms or concerns you have about testicular or prostate cancer.  Skin Cancer  Check your skin from head to toe regularly.  Tell your health care provider about any new moles or changes in moles, especially if: ? There is a change in a mole's size, shape, or color. ? You have a mole that is larger than a pencil  eraser.  Always use sunscreen. Apply sunscreen liberally and repeat throughout the day.  Protect yourself by wearing long sleeves, pants, a wide-brimmed hat, and sunglasses when outside.  What should I know about heart disease, diabetes, and high blood pressure?  If you are 49-81 years of age, have your blood pressure checked every 3-5 years. If you are 34 years of age or older, have your blood pressure checked every year. You should have your blood pressure measured twice-once when you are at a hospital or clinic, and once when you are not at a hospital or clinic. Record the average of the two measurements. To check your blood pressure when you are not at a hospital or clinic, you can use: ? An automated blood pressure machine at a pharmacy. ? A home blood pressure monitor.  Talk to your health care provider about your target blood pressure.  If you are between 6-95 years old, ask your health care provider if you should take aspirin to prevent heart disease.  Have regular diabetes screenings by checking your fasting blood sugar level. ? If you are at a normal weight and have a low risk for diabetes, have this test once every three years after the age of 100. ? If you are overweight and have a high risk for diabetes, consider being tested at a younger age or more often.  A one-time screening for abdominal aortic aneurysm (AAA) by ultrasound is recommended for men aged 26-75 years who are current or former smokers. What should I know about preventing infection? Hepatitis B If you have a higher risk for hepatitis B, you should be screened for this virus. Talk with your health care provider to find out if you are at risk for hepatitis B infection. Hepatitis C Blood testing is recommended for:  Everyone born from [birth year] through 1965.  Anyone with known risk factors for hepatitis C.  Sexually Transmitted Diseases (STDs)  You should be screened each year for STDs including gonorrhea and  chlamydia if: ? You are sexually active and are younger than 72 years of age. ? You are older than 72 years of age and your health care provider tells you that you are at risk for this type of infection. ? Your sexual activity has changed since you were last screened and you are at an increased risk for chlamydia or gonorrhea. Ask your health care provider if you are at risk.  Talk with your health care provider about whether you are at high risk of being infected with HIV. Your health care provider may recommend a prescription medicine to help prevent HIV infection.  What else can I do?  Schedule regular health, dental, and eye exams.  Stay current with your vaccines (immunizations).  Do not use any tobacco products, such as cigarettes, chewing tobacco, and e-cigarettes. If you need help quitting, ask your health care provider.  Limit alcohol intake to no more than 2  drinks per day. One drink equals 12 ounces of beer, 5 ounces of wine, or 1 ounces of hard liquor.  Do not use street drugs.  Do not share needles.  Ask your health care provider for help if you need support or information about quitting drugs.  Tell your health care provider if you often feel depressed.  Tell your health care provider if you have ever been abused or do not feel safe at home. This information is not intended to replace advice given to you by your health care provider. Make sure you discuss any questions you have with your health care provider. Document Released: 08/24/2007 Document Revised: 10/25/2015 Document Reviewed: 11/29/2014 Elsevier Interactive Patient Education  [employer].

## 2017-08-04 ENCOUNTER — Other Ambulatory Visit: Payer: Self-pay | Admitting: Family Medicine

## 2017-09-12 DIAGNOSIS — Z961 Presence of intraocular lens: Secondary | ICD-10-CM | POA: Diagnosis not present

## 2017-09-12 DIAGNOSIS — H35721 Serous detachment of retinal pigment epithelium, right eye: Secondary | ICD-10-CM | POA: Diagnosis not present

## 2017-09-12 DIAGNOSIS — H353131 Nonexudative age-related macular degeneration, bilateral, early dry stage: Secondary | ICD-10-CM | POA: Diagnosis not present

## 2017-09-12 DIAGNOSIS — H35371 Puckering of macula, right eye: Secondary | ICD-10-CM | POA: Diagnosis not present

## 2017-10-13 ENCOUNTER — Other Ambulatory Visit: Payer: Self-pay | Admitting: Family Medicine

## 2017-10-16 ENCOUNTER — Ambulatory Visit (INDEPENDENT_AMBULATORY_CARE_PROVIDER_SITE_OTHER): Payer: Medicare Other | Admitting: Family Medicine

## 2017-10-16 ENCOUNTER — Encounter: Payer: Self-pay | Admitting: Family Medicine

## 2017-10-16 DIAGNOSIS — R739 Hyperglycemia, unspecified: Secondary | ICD-10-CM

## 2017-10-16 DIAGNOSIS — K219 Gastro-esophageal reflux disease without esophagitis: Secondary | ICD-10-CM | POA: Diagnosis not present

## 2017-10-16 DIAGNOSIS — I2510 Atherosclerotic heart disease of native coronary artery without angina pectoris: Secondary | ICD-10-CM

## 2017-10-16 DIAGNOSIS — I10 Essential (primary) hypertension: Secondary | ICD-10-CM | POA: Diagnosis not present

## 2017-10-16 DIAGNOSIS — E782 Mixed hyperlipidemia: Secondary | ICD-10-CM | POA: Diagnosis not present

## 2017-10-16 LAB — COMPREHENSIVE METABOLIC PANEL
ALBUMIN: 4.2 g/dL (ref 3.5–5.2)
ALT: 17 U/L (ref 0–53)
AST: 14 U/L (ref 0–37)
Alkaline Phosphatase: 68 U/L (ref 39–117)
BUN: 11 mg/dL (ref 6–23)
CALCIUM: 9.2 mg/dL (ref 8.4–10.5)
CO2: 28 meq/L (ref 19–32)
Chloride: 103 mEq/L (ref 96–112)
Creatinine, Ser: 0.84 mg/dL (ref 0.40–1.50)
GFR: 95.38 mL/min (ref >60.00)
Glucose, Bld: 94 mg/dL (ref 70–99)
POTASSIUM: 4 meq/L (ref 3.5–5.1)
SODIUM: 139 meq/L (ref 135–145)
Total Bilirubin: 0.9 mg/dL (ref 0.2–1.2)
Total Protein: 7.2 g/dL (ref 6.0–8.3)

## 2017-10-16 LAB — CBC
HEMATOCRIT: 41.1 % (ref 39.0–52.0)
Hemoglobin: 14.1 g/dL (ref 13.0–17.0)
MCHC: 34.3 g/dL (ref 30.0–36.0)
MCV: 93.6 fl (ref 78.0–100.0)
PLATELETS: 222 10*3/uL (ref 150.0–400.0)
RBC: 4.4 Mil/uL (ref 4.22–5.81)
RDW: 13 % (ref 11.5–15.5)
WBC: 7.2 10*3/uL (ref 4.0–10.5)

## 2017-10-16 LAB — LIPID PANEL
CHOL/HDL RATIO: 3
CHOLESTEROL: 89 mg/dL (ref 0–200)
HDL: 34.4 mg/dL — ABNORMAL LOW (ref >39.00)
LDL CALC: 18 mg/dL (ref 0–99)
NonHDL: 54.25
TRIGLYCERIDES: 179 mg/dL — AB (ref 0.0–149.0)
VLDL: 35.8 mg/dL (ref 0.0–40.0)

## 2017-10-16 LAB — TSH: TSH: 3.12 u[IU]/mL (ref 0.35–4.50)

## 2017-10-16 LAB — HEMOGLOBIN A1C: Hgb A1c MFr Bld: 6.2 % (ref 4.6–6.5)

## 2017-10-16 NOTE — Assessment & Plan Note (Signed)
Well controlled, no changes to meds. Encouraged heart healthy diet such as the DASH diet and exercise as tolerated.  °

## 2017-10-16 NOTE — Progress Notes (Signed)
Subjective:  I acted as a Education administrator for Dr. Charlett Blake. [name], [place]  Patient ID: Anthony [name], male    DOB: 30-Dec-1945, 72 y.o.   MRN: [id number]  No chief complaint on file.   HPI  Patient is in today for a 6 month follow up. He has no acute concerns. He is following up on his HTN, hyperlipidemia, hyperglycemia and other medical concerns. No recent febrile illness or acute hospitalizations. Denies CP/palp/SOB/HA/congestion/fevers/GI or GU c/o. Taking meds as prescribed. He has an appointment soon for Laser surgery on his right eye. He tolerated the left eye. No polyuria or polydipsia. Tries to stay active and maintain a heart healthy diet.    Patient Care Team: [name], MD as PCP - General (Family Medicine) Renda Rolls, Jennefer Bravo, MD as Referring Physician (Dermatology) Alexis Frock, MD as Consulting Physician (Urology) Gerarda Fraction, MD as Referring Physician (Ophthalmology) Lelon Perla, MD as Consulting Physician (Cardiology) Ceasar Mons, MD as Consulting Physician (Urology)   Past Medical History:  Diagnosis Date  . Allergic rhinitis   . BPH (benign prostatic hyperplasia)   . CAD (coronary artery disease) 05/26/2009 :  primary cardiolgoist-  dr Stanford Breed   hx NSTEMI cardiac catherization  revealed-left main normal; LAD proximal calcification,  long proximal 30% stenosis,  Circumflex  AV groove   proximal  60% stenosis at mid obtuse marginal,  first large mid obtuse marginal  ostial 70% stenosis,  prox. to mid RCA  99% stenosis w/ intervention PCI and DES  . Carotid stenosis, asymptomatic, bilateral    per last duplex 03-31-2015  bilateral ICA 1-39%  . External hemorrhoids    large external  . GERD (gastroesophageal reflux disease)   . History of adenomatous polyp of colon   . History of kidney stones   . History of non-ST elevation myocardial infarction (NSTEMI) 05/26/2009  . Hyperlipidemia   . Hypertension    8-10 years  . Left ureteral stone     . Macular degeneration   . Nephrolithiasis    right side non-obstructive per CT 01-03-2017  . S/P drug eluting coronary stent placement 05/26/2009   DESx1  to pRCA  . Wears glasses     Past Surgical History:  Procedure Laterality Date  . CARDIOVASCULAR STRESS TEST  12/26/2011   dr Stanford Breed   normal nuclear study w/ no ischemia/  normal LV function and wall motion , ef 71%  . CATARACT EXTRACTION W/ INTRAOCULAR LENS  IMPLANT, BILATERAL  2010  . COLONOSCOPY  last one 06-10-2016  . CORONARY ANGIOPLASTY WITH STENT PLACEMENT  05-26-2009  dr hochrein/ dr Darnell Level brodie   Severe RCA stenosis and moderate stenosis in the left system, ef 50% with inferior hypokinsis:  PCI and DES x1 to RCA (Promus)  . CYSTOSCOPY W/ RETROGRADES Bilateral 09/29/2013   Procedure: CYSTOSCOPY WITH RETROGRADE PYELOGRAM;  Surgeon: Alexis Frock, MD;  Location: [facility];  Service: Urology;  Laterality: Bilateral;  . CYSTOSCOPY WITH RETROGRADE PYELOGRAM, URETEROSCOPY AND STENT PLACEMENT N/A 06/14/2015   Procedure: CYSTOSCOPY WITH RETROGRADE PYELOGRAM, cysto litholapexy ;  Surgeon: Alexis Frock, MD;  Location: [facility];  Service: Urology;  Laterality: N/A;  . CYSTOSCOPY/URETEROSCOPY/HOLMIUM LASER/STENT PLACEMENT Left 01/07/2017   Procedure: CYSTOSCOPY/RETROGRADE/URETEROSCOPY/HOLMIUM LASER/STENT PLACEMENT, FULGERATION OF PROSTATIC URETHRA;  Surgeon: Ceasar Mons, MD;  Location: [facility];  Service: Urology;  Laterality: Left;  . HOLMIUM LASER APPLICATION N/A 11/15/2227   Procedure: HOLMIUM LASER APPLICATION bladder stone;  Surgeon: Alexis Frock, MD;  Location: [facility];  Service: Urology;  Laterality: N/A;  . HOLMIUM LASER APPLICATION Left 16/12/9602   Procedure: HOLMIUM LASER APPLICATION;  Surgeon: Ceasar Mons, MD;  Location: [facility];  Service: Urology;  Laterality: Left;  . NEPHROLITHOTOMY Left 09/29/2013   Procedure:  NEPHROLITHOTOMY PERCUTANEOUS WITH ACCESS;  Surgeon: Alexis Frock, MD;  [facility];  Service: Urology;  Laterality: Left;  . URETEROSCOPY N/A 09/29/2013   Procedure: URETEROSCOPY;  Surgeon: Alexis Frock, MD;  [facility];  Service: Urology;  Laterality: N/A;    Family History  Problem Relation Age of Onset  . Coronary artery disease Father        in his 79's  . Hyperlipidemia Father   . Hypertension Father   . Hearing loss Father   . Heart disease Mother        deceased of heart disease  . Hypertension Mother   . Hyperlipidemia Mother   . Multiple sclerosis Son   . Heart disease Maternal Grandmother   . Heart disease Maternal Grandfather   . Heart disease Paternal Grandmother   . Heart disease Paternal Grandfather   . GER disease Sister   . Other Neg Hx        no lung cancer, colon cancer, or prostate  . Colon cancer Neg Hx     Social History   Socioeconomic History  . Marital status: Married    Spouse name: Not on file  . Number of children: Not on file  . Years of education: Not on file  . Highest education level: Not on file  Occupational History  . Not on file  Social Needs  . Financial resource strain: Not on file  . Food insecurity:    Worry: Not on file    Inability: Not on file  . Transportation needs:    Medical: Not on file    Non-medical: Not on file  Tobacco Use  . Smoking status: Former Smoker    Years: 45.00    Types: Cigarettes    Last attempt to quit: 03/12/2007    Years since quitting: 10.6  . Smokeless tobacco: Never Used  Substance and Sexual Activity  . Alcohol use: No  . Drug use: No  . 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 Physical activity:    Days per week: Not on file    Minutes per session: Not on file  . Stress: Not on file  Relationships  . Social connections:    Talks on phone: Not on file    Gets together: Not on file    Attends religious  service: Not on file    Active member of club or organization: Not on file    Attends meetings of clubs or organizations: Not on file    Relationship status: Not on file  . Intimate partner violence:    Fear of current or ex partner: Not on file    Emotionally abused: Not on file    Physically abused: Not on file    Forced sexual activity: Not on file  Other Topics Concern  . Not on file  Social History Narrative   married -34 yrs  [name] [place])   grew up in [place]   He has one son - two grandaugthers     He is retired.  He had     a 40 pack-year smoking history but has discontinued.  prev occuptation -  [employer]   Alcohol use-no   Smoking Status:  quit   Packs/Day:  0.5   Caffeine use/day:  2 beverages daily   Does Patient Exercise:  yes             Outpatient Medications Prior to Visit  Medication Sig Dispense Refill  . amLODipine (NORVASC) 10 MG tablet TAKE 1 TABLET BY MOUTH  EVERY MORNING 90 tablet 0  . aspirin 81 MG tablet Take 81 mg by mouth every morning.     . calcium carbonate (TUMS - DOSED IN MG ELEMENTAL CALCIUM) 500 MG chewable tablet Chew 1 tablet by mouth as needed for indigestion or heartburn.    . losartan (COZAAR) 100 MG tablet TAKE 1 TABLET BY MOUTH  EVERY MORNING 90 tablet 0  . metoprolol tartrate (LOPRESSOR) 25 MG tablet Take 0.5 tablets (12.5 mg total) by mouth 2 (two) times daily. 180 tablet 3  . Multiple Vitamins-Minerals (ICAPS PO) Take by mouth every morning.     . potassium citrate (UROCIT-K) 10 MEQ (1080 MG) SR tablet takes one tablet twice daily  3  . rosuvastatin (CRESTOR) 40 MG tablet Take 1 tablet (40 mg total) by mouth daily. 90 tablet 3   No facility-administered medications prior to visit.     No Known Allergies  Review of Systems  Constitutional: Negative for fever and malaise/fatigue.  HENT: Negative for congestion.   Eyes: Negative for blurred vision.  Respiratory: Negative for shortness of breath.     Cardiovascular: Negative for chest pain, palpitations and leg swelling.  Gastrointestinal: Negative for abdominal pain, blood in stool and nausea.  Genitourinary: Negative for dysuria and frequency.  Musculoskeletal: Negative for falls.  Skin: Negative for rash.  Neurological: Negative for dizziness, loss of consciousness and headaches.  Endo/Heme/Allergies: Negative for environmental allergies.  Psychiatric/Behavioral: Negative for depression. The patient is not nervous/anxious.        Objective:    Physical Exam  Constitutional: He is oriented to person, place, and time. He appears well-developed and well-nourished. No distress.  HENT:  Head: Normocephalic and atraumatic.  Nose: Nose normal.  Eyes: Right eye exhibits no discharge. Left eye exhibits no discharge.  Neck: Normal range of motion. Neck supple.  Cardiovascular: Normal rate and regular rhythm.  No murmur heard. Pulmonary/Chest: Effort normal and breath sounds normal.  Abdominal: Soft. Bowel sounds are normal. There is no tenderness.  Musculoskeletal: He exhibits no edema.  Neurological: He is alert and oriented to person, place, and time.  Skin: Skin is warm and dry.  Psychiatric: He has a normal mood and affect.  Nursing note and vitals reviewed.   BP 130/60 (BP Location: Left Arm, Patient Position: Sitting, Cuff Size: Normal)   Pulse 61   Temp 98 F (36.7 C) (Oral)   Resp 18   Ht 5\' 6"  (1.676 m)   Wt 175 lb 12.8 oz (79.7 kg)   SpO2 97%   BMI 28.37 kg/m  Wt Readings from Last 3 Encounters:  10/16/17 175 lb 12.8 oz (79.7 kg)  04/18/17 175 lb 9.6 oz (79.7 kg)  04/18/17 175 lb 9.6 oz (79.7 kg)   BP Readings from Last 3 Encounters:  10/16/17 130/60  04/18/17 136/64  04/18/17 136/64     Immunization History  Administered Date(s) Administered  . Influenza Split 12/07/2010, 11/20/2011, 12/05/2011  . Influenza Whole 01/06/2009, 11/20/2009  . Influenza, High Dose Seasonal PF 12/06/2015, 12/17/2016  .  Influenza,inj,Quad PF,6+ Mos 12/09/2012, 12/15/2013, 11/30/2014  .  Pneumococcal Conjugate-13 04/14/2015  . Pneumococcal Polysaccharide-23 05/11/2009, 12/17/2016  . Td 10/27/2007  . Zoster 05/30/2008    Health Maintenance  Topic Date Due  . INFLUENZA VACCINE  10/09/2017  . TETANUS/TDAP  10/26/2017  . COLONOSCOPY  06/11/2019  . Hepatitis C Screening  Completed  . PNA vac Low Risk Adult  Completed    Lab Results  Component Value Date   WBC 7.2 10/16/2017   HGB 14.1 10/16/2017   HCT 41.1 10/16/2017   PLT 222.0 10/16/2017   GLUCOSE 94 10/16/2017   CHOL 89 10/16/2017   TRIG 179.0 (H) 10/16/2017   HDL 34.40 (L) 10/16/2017   LDLCALC 18 10/16/2017   ALT 17 10/16/2017   AST 14 10/16/2017   NA 139 10/16/2017   K 4.0 10/16/2017   CL 103 10/16/2017   CREATININE 0.84 10/16/2017   BUN 11 10/16/2017   CO2 28 10/16/2017   TSH 3.12 10/16/2017   PSA 3.32 03/24/2013   INR 1.05 05/25/2009   HGBA1C 6.2 10/16/2017    Lab Results  Component Value Date   TSH 3.12 10/16/2017   Lab Results  Component Value Date   WBC 7.2 10/16/2017   HGB 14.1 10/16/2017   HCT 41.1 10/16/2017   MCV 93.6 10/16/2017   PLT 222.0 10/16/2017   Lab Results  Component Value Date   NA 139 10/16/2017   K 4.0 10/16/2017   CO2 28 10/16/2017   GLUCOSE 94 10/16/2017   BUN 11 10/16/2017   CREATININE 0.84 10/16/2017   BILITOT 0.9 10/16/2017   ALKPHOS 68 10/16/2017   AST 14 10/16/2017   ALT 17 10/16/2017   PROT 7.2 10/16/2017   ALBUMIN 4.2 10/16/2017   CALCIUM 9.2 10/16/2017   ANIONGAP 12 09/30/2013   GFR 95.38 10/16/2017   Lab Results  Component Value Date   CHOL 89 10/16/2017   Lab Results  Component Value Date   HDL 34.40 (L) 10/16/2017   Lab Results  Component Value Date   LDLCALC 18 10/16/2017   Lab Results  Component Value Date   TRIG 179.0 (H) 10/16/2017   Lab Results  Component Value Date   CHOLHDL 3 10/16/2017   Lab Results  Component Value Date   HGBA1C 6.2 10/16/2017           Assessment & Plan:   Problem List Items Addressed This Visit    Hyperlipidemia, mixed    Encouraged heart healthy diet, increase exercise, avoid trans fats, consider a krill oil cap daily      Relevant Orders   Lipid panel (Completed)   Essential hypertension    Well controlled, no changes to meds. Encouraged heart healthy diet such as the DASH diet and exercise as tolerated.       Relevant Orders   CBC (Completed)   Comprehensive metabolic panel (Completed)   TSH (Completed)   CAD, NATIVE VESSEL    Follows with Dr Stanford Breed and is doing well      GERD    Avoid offending foods, start probiotics. Do not eat large meals in late evening and consider raising head of bed. Tomato is his worst offender if he avoids it he does well      Hyperglycemia    hgba1c acceptable, minimize simple carbs. Increase exercise as tolerated.       Relevant Orders   Hemoglobin A1c (Completed)      I am having Anthony Limbo "Mortimer Fries" maintain his aspirin, Multiple Vitamins-Minerals (ICAPS PO), potassium citrate, calcium carbonate, metoprolol tartrate,  rosuvastatin, amLODipine, and losartan.  No orders of the defined types were placed in this encounter.   CMA served as Education administrator during this visit. History, Physical and Plan performed by medical provider. Documentation and orders reviewed and attested to.  Penni Homans, MD

## 2017-10-16 NOTE — Assessment & Plan Note (Signed)
hgba1c acceptable, minimize simple carbs. Increase exercise as tolerated.  

## 2017-10-16 NOTE — Assessment & Plan Note (Addendum)
Follows with Dr Stanford Breed and is doing well

## 2017-10-16 NOTE — Patient Instructions (Signed)

## 2017-10-16 NOTE — Assessment & Plan Note (Signed)
Avoid offending foods, start probiotics. Do not eat large meals in late evening and consider raising head of bed. Tomato is his worst offender if he avoids it he does well

## 2017-10-16 NOTE — Assessment & Plan Note (Signed)
Encouraged heart healthy diet, increase exercise, avoid trans fats, consider a krill oil cap daily 

## 2017-11-19 DIAGNOSIS — H26492 Other secondary cataract, left eye: Secondary | ICD-10-CM | POA: Diagnosis not present

## 2017-12-01 ENCOUNTER — Ambulatory Visit (INDEPENDENT_AMBULATORY_CARE_PROVIDER_SITE_OTHER): Payer: Medicare Other

## 2017-12-01 DIAGNOSIS — Z23 Encounter for immunization: Secondary | ICD-10-CM | POA: Diagnosis not present

## 2018-01-25 ENCOUNTER — Other Ambulatory Visit: Payer: Self-pay | Admitting: Family Medicine

## 2018-01-25 DIAGNOSIS — I10 Essential (primary) hypertension: Secondary | ICD-10-CM

## 2018-01-29 DIAGNOSIS — D485 Neoplasm of uncertain behavior of skin: Secondary | ICD-10-CM | POA: Diagnosis not present

## 2018-01-29 DIAGNOSIS — L720 Epidermal cyst: Secondary | ICD-10-CM | POA: Diagnosis not present

## 2018-02-17 ENCOUNTER — Other Ambulatory Visit: Payer: Self-pay | Admitting: Cardiology

## 2018-02-17 DIAGNOSIS — I2510 Atherosclerotic heart disease of native coronary artery without angina pectoris: Secondary | ICD-10-CM

## 2018-02-19 DIAGNOSIS — N201 Calculus of ureter: Secondary | ICD-10-CM | POA: Diagnosis not present

## 2018-02-19 DIAGNOSIS — N401 Enlarged prostate with lower urinary tract symptoms: Secondary | ICD-10-CM | POA: Diagnosis not present

## 2018-02-19 LAB — PSA

## 2018-02-26 DIAGNOSIS — Z87442 Personal history of urinary calculi: Secondary | ICD-10-CM | POA: Diagnosis not present

## 2018-03-09 NOTE — Progress Notes (Signed)
HPI: FU CAD; admitted in March 2011 with a non-ST elevation myocardial infarction. He underwent cardiac catheterization which revealed - Left main was normal. The LAD had proximal calcification. There was long proximal 30% stenosis. Circumflex in the AV groove had proximal 60% stenosis at a mid obtuse marginal. This first large mid obtuse marginal had ostial 70% stenosis. The right coronary artery had a proximal long 50% stenosis. There was mid subtotal stenosis. The EF was 50% with inferior hypokinesis. Patient had a drug-eluting stent to the right coronary artery at that time. Abdominal ultrasound in July of 2011 showed no aneurysm. Nuclear study in December of 2013 showed an ejection fraction of 71% and normal perfusion. Carotid Dopplers in Jan 2017 showed 1-39 bilateral stenosis.Since I last saw him, the patient denies any dyspnea on exertion, orthopnea, PND, pedal edema, palpitations, syncope or chest pain.   Current Outpatient Medications  Medication Sig Dispense Refill  . amLODipine (NORVASC) 10 MG tablet TAKE 1 TABLET BY MOUTH  EVERY MORNING 90 tablet 0  . aspirin 81 MG tablet Take 81 mg by mouth every morning.     Marland Kitchen losartan (COZAAR) 100 MG tablet TAKE 1 TABLET BY MOUTH  EVERY MORNING 90 tablet 0  . Multiple Vitamins-Minerals (ICAPS PO) Take by mouth every morning.     . potassium citrate (UROCIT-K) 10 MEQ (1080 MG) SR tablet takes one tablet twice daily  3  . metoprolol tartrate (LOPRESSOR) 25 MG tablet Take 0.5 tablets (12.5 mg total) by mouth 2 (two) times daily. 90 tablet 3  . rosuvastatin (CRESTOR) 40 MG tablet Take 1 tablet (40 mg total) by mouth daily. 90 tablet 3   No current facility-administered medications for this visit.      Past Medical History:  Diagnosis Date  . Allergic rhinitis   . BPH (benign prostatic hyperplasia)   . CAD (coronary artery disease) 05/26/2009 :  primary cardiolgoist-  dr Stanford Breed   hx NSTEMI cardiac catherization  revealed-left main  normal; LAD proximal calcification,  long proximal 30% stenosis,  Circumflex  AV groove   proximal  60% stenosis at mid obtuse marginal,  first large mid obtuse marginal  ostial 70% stenosis,  prox. to mid RCA  99% stenosis w/ intervention PCI and DES  . Carotid stenosis, asymptomatic, bilateral    per last duplex 03-31-2015  bilateral ICA 1-39%  . External hemorrhoids    large external  . GERD (gastroesophageal reflux disease)   . History of adenomatous polyp of colon   . History of kidney stones   . History of non-ST elevation myocardial infarction (NSTEMI) 05/26/2009  . Hyperlipidemia   . Hypertension    8-10 years  . Left ureteral stone   . Macular degeneration   . Nephrolithiasis    right side non-obstructive per CT 01-03-2017  . S/P drug eluting coronary stent placement 05/26/2009   DESx1  to pRCA  . Wears glasses     Past Surgical History:  Procedure Laterality Date  . CARDIOVASCULAR STRESS TEST  12/26/2011   dr Stanford Breed   normal nuclear study w/ no ischemia/  normal LV function and wall motion , ef 71%  . CATARACT EXTRACTION W/ INTRAOCULAR LENS  IMPLANT, BILATERAL  2010  . COLONOSCOPY  last one 06-10-2016  . CORONARY ANGIOPLASTY WITH STENT PLACEMENT  05-26-2009  dr hochrein/ dr Darnell Level brodie   Severe RCA stenosis and moderate stenosis in the left system, ef 50% with inferior hypokinsis:  PCI and DES x1 to  RCA (Promus)  . CYSTOSCOPY W/ RETROGRADES Bilateral 09/29/2013   Procedure: CYSTOSCOPY WITH RETROGRADE PYELOGRAM;  Surgeon: Alexis Frock, MD;  Location[facility];  Service: Urology;  Laterality: Bilateral;  . CYSTOSCOPY WITH RETROGRADE PYELOGRAM, URETEROSCOPY AND STENT PLACEMENT N/A 06/14/2015   Procedure: CYSTOSCOPY WITH RETROGRADE PYELOGRAM, cysto litholapexy ;  Surgeon: Alexis Frock, MD;  Location: [facility];  Service: Urology;  Laterality: N/A;  . CYSTOSCOPY/URETEROSCOPY/HOLMIUM LASER/STENT PLACEMENT Left 01/07/2017   Procedure:  CYSTOSCOPY/RETROGRADE/URETEROSCOPY/HOLMIUM LASER/STENT PLACEMENT, FULGERATION OF PROSTATIC URETHRA;  Surgeon: Ceasar Mons, MD;  Location: [facility];  Service: Urology;  Laterality: Left;  . HOLMIUM LASER APPLICATION N/A 03/16/1094   Procedure: HOLMIUM LASER APPLICATION bladder stone;  Surgeon: Alexis Frock, MD;  Location: [facility];  Service: Urology;  Laterality: N/A;  . HOLMIUM LASER APPLICATION Left 04/54/0981   Procedure: HOLMIUM LASER APPLICATION;  Surgeon: Ceasar Mons, MD;  Location: [facility];  Service: Urology;  Laterality: Left;  . NEPHROLITHOTOMY Left 09/29/2013   Procedure: NEPHROLITHOTOMY PERCUTANEOUS WITH ACCESS;  Surgeon: Alexis Frock, MD;  [facility];  Service: Urology;  Laterality: Left;  . URETEROSCOPY N/A 09/29/2013   Procedure: URETEROSCOPY;  Surgeon: Alexis Frock, MD;  [facility];  Service: Urology;  Laterality: N/A;    Social History   Socioeconomic History  . Marital status: Married    Spouse name: Not on file  . Number of children: Not on file  . Years of education: Not on file  . Highest education level: Not on file  Occupational History  . Not on file  Social Needs  . Financial resource strain: Not on file  . Food insecurity:    Worry: Not on file    Inability: Not on file  . Transportation needs:    Medical: Not on file    Non-medical: Not on file  Tobacco Use  . Smoking status: Former Smoker    Years: 45.00    Types: Cigarettes    Last attempt to quit: 03/12/2007    Years since quitting: 11.0  . Smokeless tobacco: Never Used  Substance and Sexual Activity  . Alcohol use: No  . Drug use: No  . 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 Physical activity:    Days per week: Not on file    Minutes per session: Not on file  . Stress: Not on file  Relationships  . Social connections:    Talks on  phone: Not on file    Gets together: Not on file    Attends religious service: Not on file    Active member of club or organization: Not on file    Attends meetings of clubs or organizations: Not on file    Relationship status: Not on file  . Intimate partner violence:    Fear of current or ex partner: Not on file    Emotionally abused: Not on file    Physically abused: Not on file    Forced sexual activity: Not on file  Other Topics Concern  . Not on file  Social History Narrative   married -53 yrs  [name] [place])   grew up in [place]   He has one son - two grandaugthers     He is retired.  He had     a 40 pack-year smoking history but has discontinued.     prev occuptation -  [employer]  Alcohol use-no   Smoking Status:  quit   Packs/Day:  0.5   Caffeine use/day:  2 beverages daily   Does Patient Exercise:  yes             Family History  Problem Relation Age of Onset  . Coronary artery disease Father        in his 22's  . Hyperlipidemia Father   . Hypertension Father   . Hearing loss Father   . Heart disease Mother        deceased of heart disease  . Hypertension Mother   . Hyperlipidemia Mother   . Multiple sclerosis Son   . Heart disease Maternal Grandmother   . Heart disease Maternal Grandfather   . Heart disease Paternal Grandmother   . Heart disease Paternal Grandfather   . GER disease Sister   . Other Neg Hx        no lung cancer, colon cancer, or prostate  . Colon cancer Neg Hx     ROS: no fevers or chills, productive cough, hemoptysis, dysphasia, odynophagia, melena, hematochezia, dysuria, hematuria, rash, seizure activity, orthopnea, PND, pedal edema, claudication. Remaining systems are negative.  Physical Exam: Well-developed well-nourished in no acute distress.  Skin is warm and dry.  HEENT is normal.  Neck is supple.  Chest is clear to auscultation with normal expansion.  Cardiovascular exam is regular rate and rhythm.    Abdominal exam nontender or distended. No masses palpated. Positive bruit Extremities show no edema. neuro grossly intact  ECG-sinus rhythm at a rate of 72.  Nonspecific ST changes.  Personally reviewed  A/P  1 coronary artery disease-patient denies chest pain.  Plan to continue medical therapy with aspirin and statin.  2 hypertension-patient's blood pressure is controlled.  Continue present medications and follow.  3 carotid artery disease-mild on most recent Dopplers.  New medical therapy with aspirin and statin.  4 bruit-schedule abdominal ultrasound  5 hyperlipidemia-continue statin.  Kirk Ruths, MD

## 2018-03-16 DIAGNOSIS — Z87442 Personal history of urinary calculi: Secondary | ICD-10-CM | POA: Diagnosis not present

## 2018-03-23 ENCOUNTER — Encounter: Payer: Self-pay | Admitting: Cardiology

## 2018-03-23 ENCOUNTER — Other Ambulatory Visit: Payer: Self-pay | Admitting: *Deleted

## 2018-03-23 ENCOUNTER — Ambulatory Visit (INDEPENDENT_AMBULATORY_CARE_PROVIDER_SITE_OTHER): Payer: Medicare Other | Admitting: Cardiology

## 2018-03-23 VITALS — BP 132/60 | HR 72 | Ht 65.0 in | Wt 182.0 lb

## 2018-03-23 DIAGNOSIS — I2510 Atherosclerotic heart disease of native coronary artery without angina pectoris: Secondary | ICD-10-CM

## 2018-03-23 DIAGNOSIS — R0989 Other specified symptoms and signs involving the circulatory and respiratory systems: Secondary | ICD-10-CM

## 2018-03-23 DIAGNOSIS — I10 Essential (primary) hypertension: Secondary | ICD-10-CM

## 2018-03-23 DIAGNOSIS — Z87891 Personal history of nicotine dependence: Secondary | ICD-10-CM

## 2018-03-23 DIAGNOSIS — E7800 Pure hypercholesterolemia, unspecified: Secondary | ICD-10-CM

## 2018-03-23 MED ORDER — ROSUVASTATIN CALCIUM 40 MG PO TABS: 40.0000 mg | ORAL_TABLET | Freq: Every day | ORAL | 3 refills | Status: DC

## 2018-03-23 MED ORDER — METOPROLOL TARTRATE 25 MG PO TABS: 12.5000 mg | ORAL_TABLET | Freq: Two times a day (BID) | ORAL | 3 refills | Status: DC

## 2018-03-23 NOTE — Patient Instructions (Signed)
Medication Instructions:  NO CHANGE If you need a refill on your cardiac medications before your next appointment, please call your pharmacy.   Lab work: If you have labs (blood work) drawn today and your tests are completely normal, you will receive your results only by: Marland Kitchen MyChart Message (if you have MyChart) OR . A paper copy in the mail If you have any lab test that is abnormal or we need to change your treatment, we will call you to review the results.  Testing/Procedures: Your physician has requested that you have an abdominal aorta duplex. During this test, an ultrasound is used to evaluate the aorta. Allow 30 minutes for this exam. Do not eat after midnight the day before and avoid carbonated beverages SCHEDULE IN THE [place] OFFICE  Follow-Up: At [facility], you and your health needs are our priority.  As part of our continuing mission to provide you with exceptional heart care, we have created designated Provider Care Teams.  These Care Teams include your primary Cardiologist (physician) and Advanced Practice Providers (APPs -  Physician Assistants and Nurse Practitioners) who all work together to provide you with the care you need, when you need it.  Your physician recommends that you schedule a follow-up appointment in: [place]

## 2018-03-26 ENCOUNTER — Ambulatory Visit (HOSPITAL_BASED_OUTPATIENT_CLINIC_OR_DEPARTMENT_OTHER)
Admission: RE | Admit: 2018-03-26 | Discharge: 2018-03-26 | Disposition: A | Discharge status: Home / Self Care | Payer: Medicare Other | Source: Ambulatory Visit | Attending: Cardiology | Admitting: Cardiology

## 2018-03-26 DIAGNOSIS — R0989 Other specified symptoms and signs involving the circulatory and respiratory systems: Secondary | ICD-10-CM | POA: Insufficient documentation

## 2018-03-26 DIAGNOSIS — Z87891 Personal history of nicotine dependence: Secondary | ICD-10-CM | POA: Insufficient documentation

## 2018-03-26 NOTE — Progress Notes (Signed)
ABDOMINAL AORTA DUPLEX ULTRASOUND    Abdominal Aorta: No evidence of an abdominal aortic aneurysm was visualized.   Stenosis:  Location Stenosis  Distal Aorta <50% stenosis   Right Common Iliac <50% stenosis    IVC/Iliac: There is no evidence of thrombus involving the IVC.     03/26/18 Cardell Peach RDCS, RVT

## 2018-04-01 ENCOUNTER — Other Ambulatory Visit: Payer: Self-pay | Admitting: Family Medicine

## 2018-04-01 DIAGNOSIS — I10 Essential (primary) hypertension: Secondary | ICD-10-CM

## 2018-04-17 NOTE — Progress Notes (Addendum)
Subjective:   Anthony [name] is a 73 y.o. male who presents for Medicare Annual/Subsequent preventive examination.  Reports health as good. Enjoys woodworking.   Review of Systems: No ROS.  Medicare Wellness Visit. Additional risk factors are reflected in the social history.   Sleep patterns: no issues Home Safety/Smoke Alarms: Feels safe in home. Smoke alarms in place.  Lives in 1 story home with wife. No pets. Step over tub with grab rails.  Eye- yearly. utd per pt  Male:   CCS- 06/10/16. 3 yr recall  PSA-  Lab Results  Component Value Date   PSA 3.32 03/24/2013   PSA 2.46 02/12/2012   PSA 1.61 02/13/2011       Objective:    Vitals: BP 136/60 (BP Location: Left Arm, Patient Position: Sitting, Cuff Size: Large)   Pulse 70   Ht 5\' 5"  (1.651 m)   Wt 183 lb (83 kg)   SpO2 97%   BMI 30.45 kg/m   Body mass index is 30.45 kg/m.  Advanced Directives 04/20/2018 04/18/2017 01/07/2017 01/03/2017 06/10/2016 04/16/2016 06/14/2015  Does Patient Have a Medical Advance Directive? Yes Yes Yes No Yes Yes Yes  Type of Paramedic of [place];Living will [place];Living will [place];Living will - - [place];Living will -  Does patient want to make changes to medical advance directive? No - Patient declined No - Patient declined No - Patient declined - - No - Patient declined -  Copy of [place] in Chart? Yes - validated most recent copy scanned in chart (See row information) Yes No - copy requested - - No - copy requested No - copy requested  Pre-existing out of facility DNR order (yellow form or pink MOST form) - - - - - - -    Tobacco Social History   Tobacco Use  Smoking Status Former Smoker  . Years: 45.00  . Types: Cigarettes  . Last attempt to quit: 03/12/2007  . Years since quitting: 11.1  Smokeless Tobacco Never Used     Counseling given: Not Answered   Clinical Intake:     Pain : No/denies pain                 Past Medical History:  Diagnosis Date  . Allergic rhinitis   . BPH (benign prostatic hyperplasia)   . CAD (coronary artery disease) 05/26/2009 :  primary cardiolgoist-  dr Stanford Breed   hx NSTEMI cardiac catherization  revealed-left main normal; LAD proximal calcification,  long proximal 30% stenosis,  Circumflex  AV groove   proximal  60% stenosis at mid obtuse marginal,  first large mid obtuse marginal  ostial 70% stenosis,  prox. to mid RCA  99% stenosis w/ intervention PCI and DES  . Carotid stenosis, asymptomatic, bilateral    per last duplex 03-31-2015  bilateral ICA 1-39%  . External hemorrhoids    large external  . GERD (gastroesophageal reflux disease)   . History of adenomatous polyp of colon   . History of kidney stones   . History of non-ST elevation myocardial infarction (NSTEMI) 05/26/2009  . Hyperlipidemia   . Hypertension    8-10 years  . Left ureteral stone   . Macular degeneration   . Nephrolithiasis    right side non-obstructive per CT 01-03-2017  . S/P drug eluting coronary stent placement 05/26/2009   DESx1  to pRCA  . Wears glasses    Past Surgical History:  Procedure Laterality Date  . CARDIOVASCULAR STRESS TEST  12/26/2011   dr Stanford Breed   normal nuclear study w/ no ischemia/  normal LV function and wall motion , ef 71%  . CATARACT EXTRACTION W/ INTRAOCULAR LENS  IMPLANT, BILATERAL  2010  . COLONOSCOPY  last one 06-10-2016  . CORONARY ANGIOPLASTY WITH STENT PLACEMENT  05-26-2009  dr hochrein/ dr Darnell Level brodie   Severe RCA stenosis and moderate stenosis in the left system, ef 50% with inferior hypokinsis:  PCI and DES x1 to RCA (Promus)  . CYSTOSCOPY W/ RETROGRADES Bilateral 09/29/2013   Procedure: CYSTOSCOPY WITH RETROGRADE PYELOGRAM;  Surgeon: Alexis Frock, MD;  Location: [facility];  Service: Urology;  Laterality: Bilateral;  . CYSTOSCOPY WITH RETROGRADE PYELOGRAM, URETEROSCOPY AND STENT PLACEMENT N/A 06/14/2015     Procedure: CYSTOSCOPY WITH RETROGRADE PYELOGRAM, cysto litholapexy ;  Surgeon: Alexis Frock, MD;  Location: [facility];  Service: Urology;  Laterality: N/A;  . CYSTOSCOPY/URETEROSCOPY/HOLMIUM LASER/STENT PLACEMENT Left 01/07/2017   Procedure: CYSTOSCOPY/RETROGRADE/URETEROSCOPY/HOLMIUM LASER/STENT PLACEMENT, FULGERATION OF PROSTATIC URETHRA;  Surgeon: Ceasar Mons, MD;  Location: [facility];  Service: Urology;  Laterality: Left;  . HOLMIUM LASER APPLICATION N/A 07/10/8839   Procedure: HOLMIUM LASER APPLICATION bladder stone;  Surgeon: Alexis Frock, MD;  Location: [facility];  Service: Urology;  Laterality: N/A;  . HOLMIUM LASER APPLICATION Left 66/08/3014   Procedure: HOLMIUM LASER APPLICATION;  Surgeon: Ceasar Mons, MD;  Location: [facility];  Service: Urology;  Laterality: Left;  . NEPHROLITHOTOMY Left 09/29/2013   Procedure: NEPHROLITHOTOMY PERCUTANEOUS WITH ACCESS;  Surgeon: Alexis Frock, MD;  Location: [facility];  Service: Urology;  Laterality: Left;  . URETEROSCOPY N/A 09/29/2013   Procedure: URETEROSCOPY;  Surgeon: Alexis Frock, MD;  Location: [facility];  Service: Urology;  Laterality: N/A;   Family History  Problem Relation Age of Onset  . Coronary artery disease Father        in his 66's  . Hyperlipidemia Father   . Hypertension Father   . Hearing loss Father   . Heart disease Mother        deceased of heart disease  . Hypertension Mother   . Hyperlipidemia Mother   . Multiple sclerosis Son   . Heart disease Maternal Grandmother   . Heart disease Maternal Grandfather   . Heart disease Paternal Grandmother   . Heart disease Paternal Grandfather   . GER disease Sister   . Other Neg Hx        no lung cancer, colon cancer, or prostate  . Colon cancer Neg Hx    Social History   Socioeconomic History  . Marital status: Married    Spouse name: Not on file  . Number of children: Not on  file  . Years of education: Not on file  . Highest education level: Not on file  Occupational History  . Not on file  Social Needs  . Financial resource strain: Not on file  . Food insecurity:    Worry: Not on file    Inability: Not on file  . Transportation needs:    Medical: Not on file    Non-medical: Not on file  Tobacco Use  . Smoking status: Former Smoker    Years: 45.00    Types: Cigarettes    Last attempt to quit: 03/12/2007    Years since quitting: 11.1  . Smokeless tobacco: Never Used  Substance and Sexual Activity  . Alcohol use: No  . Drug use: No  .  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 Physical activity:    Days per week: Not on file    Minutes per session: Not on file  . Stress: Not on file  Relationships  . Social connections:    Talks on phone: Not on file    Gets together: Not on file    Attends religious service: Not on file    Active member of club or organization: Not on file    Attends meetings of clubs or organizations: Not on file    Relationship status: Not on file  Other Topics Concern  . Not on file  Social History Narrative   married -1 yrs  [name] Granjeno)   grew up in [place]   He has one son - two grandaugthers     He is retired.  He had     a 40 pack-year smoking history but has discontinued.     prev occuptation -  [employer]   Alcohol use-no   Smoking Status:  quit   Packs/Day:  0.5   Caffeine use/day:  2 beverages daily   Does Patient Exercise:  yes             Outpatient Encounter Medications as of 04/20/2018  Medication Sig  . amLODipine (NORVASC) 10 MG tablet TAKE 1 TABLET BY MOUTH  EVERY MORNING  . aspirin 81 MG tablet Take 81 mg by mouth every morning.   Marland Kitchen losartan (COZAAR) 100 MG tablet TAKE 1 TABLET BY MOUTH  EVERY MORNING  . metoprolol tartrate (LOPRESSOR) 25 MG tablet Take 0.5 tablets (12.5 mg total) by mouth 2 (two) times daily.  .  Multiple Vitamins-Minerals (ICAPS PO) Take by mouth every morning.   . potassium citrate (UROCIT-K) 10 MEQ (1080 MG) SR tablet takes one tablet twice daily  . rosuvastatin (CRESTOR) 40 MG tablet Take 1 tablet (40 mg total) by mouth daily.   No facility-administered encounter medications on file as of 04/20/2018.     Activities of Daily Living In your present state of health, do you have any difficulty performing the following activities: 04/20/2018  Hearing? N  Vision? N  Difficulty concentrating or making decisions? N  Walking or climbing stairs? N  Dressing or bathing? N  Doing errands, shopping? N  Preparing Food and eating ? N  Using the Toilet? N  In the past six months, have you accidently leaked urine? N  Do you have problems with loss of bowel control? N  Managing your Medications? N  Managing your Finances? N  Housekeeping or managing your Housekeeping? N  Some recent data might be hidden    Patient Care Team: [name], MD as PCP - General (Family Medicine) [place], Jennefer Bravo, MD as Referring Physician (Dermatology) Alexis Frock, MD as Consulting Physician (Urology) Gerarda Fraction, MD as Referring Physician (Ophthalmology) Lelon Perla, MD as Consulting Physician (Cardiology) Ceasar Mons, MD as Consulting Physician (Urology)   Assessment:   This is a routine wellness examination for Anthony [name]. Physical assessment deferred to PCP.  Exercise Activities and Dietary recommendations Current Exercise Habits: Structured exercise class, Type of exercise: treadmill(and row maching), Time (Minutes): 25, Frequency (Times/Week): 5, Weekly Exercise (Minutes/Week): 125, Exercise limited by: None identified Diet (meal preparation, eat out, water intake, caffeinated beverages, dairy products, fruits and vegetables): in general, a "healthy" diet  , well balanced, on average, 3 meals per day Breakfast: cereal with almonds  and fruit. Coffee x2. water Lunch:  tuna or chicken salad, fruit. water Dinner:  Steak, green beans, tomatoes, potatoes.    Goals    . Maintain current active lifestyle.  (pt-stated)       Fall Risk Fall Risk  04/20/2018 04/18/2017 04/16/2016 04/14/2015 04/05/2014  Falls in the past year? 0 No No No No    Depression Screen PHQ 2/9 Scores 04/20/2018 04/18/2017 04/16/2016 04/16/2016  PHQ - 2 Score 0 0 0 0    Cognitive Function MMSE - Mini Mental State Exam 04/20/2018 04/18/2017  Orientation to time 5 5  Orientation to Place 5 5  Registration 3 3  Attention/ Calculation 5 5  Recall 3 3  Language- name 2 objects 2 2  Language- repeat 1 1  Language- follow 3 step command 3 3  Language- read & follow direction 1 1  Write a sentence 1 1  Copy design 1 1  Total score 30 30        Immunization History  Administered Date(s) Administered  . Influenza Split 12/07/2010, 11/20/2011, 12/05/2011  . Influenza Whole 01/06/2009, 11/20/2009  . Influenza, High Dose Seasonal PF 12/06/2015, 12/17/2016, 12/01/2017  . Influenza,inj,Quad PF,6+ Mos 12/09/2012, 12/15/2013, 11/30/2014  . Pneumococcal Conjugate-13 04/14/2015  . Pneumococcal Polysaccharide-23 05/11/2009, 12/17/2016  . Td 10/27/2007  . Zoster 05/30/2008  . Zoster Recombinat (Shingrix) 12/24/2017, 03/09/2018   Screening Tests Health Maintenance  Topic Date Due  . TETANUS/TDAP  10/26/2017  . COLONOSCOPY  06/11/2019  . INFLUENZA VACCINE  Completed  . Hepatitis C Screening  Completed  . PNA vac Low Risk Adult  Completed     Plan:     Please schedule your next medicare wellness visit with me in 1 yr.  Continue to eat a healthy diet.  Continue your exercise regimen  Keep up the great work!!!  I have personally reviewed and noted the following in the patient's chart:   . Medical and social history . Use of alcohol, tobacco or illicit drugs  . Current medications and supplements . Functional ability and status . Nutritional status . Physical activity . Advanced  directives . List of other physicians . Hospitalizations, surgeries, and ER visits in previous 12 months . Vitals . Screenings to include cognitive, depression, and falls . Referrals and appointments  In addition, I have reviewed and discussed with patient certain preventive protocols, quality metrics, and best practice recommendations. A written personalized care plan for preventive services as well as general preventive health recommendations were provided to patient.     Shela Nevin, [place]  04/20/2018  Medical screening examination/treatment was performed by qualified clinical staff member and as supervising physician I was immediately available for consultation/collaboration. I have reviewed documentation and agree with assessment and plan.  Penni Homans, MD

## 2018-04-20 ENCOUNTER — Ambulatory Visit (INDEPENDENT_AMBULATORY_CARE_PROVIDER_SITE_OTHER): Payer: Medicare Other | Admitting: *Deleted

## 2018-04-20 ENCOUNTER — Encounter: Payer: Self-pay | Admitting: *Deleted

## 2018-04-20 VITALS — BP 136/60 | HR 70 | Ht 65.0 in | Wt 183.0 lb

## 2018-04-20 DIAGNOSIS — Z0000 Encounter for general adult medical examination without abnormal findings: Secondary | ICD-10-CM

## 2018-04-20 NOTE — Patient Instructions (Signed)
Please schedule your next medicare wellness visit with me in 1 yr.  Continue to eat a healthy diet.  Continue your exercise regimen  Keep up the great work!!!   Anthony [name] , Thank you for taking time to come for your Medicare Wellness Visit. I appreciate your ongoing commitment to your health goals. Please review the following plan we discussed and let me know if I can assist you in the future.   These are the goals we discussed: Goals    . Maintain current active lifestyle.  (pt-stated)       This is a list of the screening recommended for you and due dates:  Health Maintenance  Topic Date Due  . Tetanus Vaccine  10/26/2017  . Colon Cancer Screening  06/11/2019  . Flu Shot  Completed  .  Hepatitis C: One time screening is recommended by Center for Disease Control  (CDC) for  adults born from [birth year] through 1965.   Completed  . Pneumonia vaccines  Completed    Health Maintenance After Age 45 After age 60, you are at a higher risk for certain long-term diseases and infections as well as injuries from falls. Falls are a major cause of broken bones and head injuries in people who are older than age 53. Getting regular preventive care can help to keep you healthy and well. Preventive care includes getting regular testing and making lifestyle changes as recommended by your health care provider. Talk with your health care provider about:  Which screenings and tests you should have. A screening is a test that checks for a disease when you have no symptoms.  A diet and exercise plan that is right for you. What should I know about screenings and tests to prevent falls? Screening and testing are the best ways to find a health problem early. Early diagnosis and treatment give you the best chance of managing medical conditions that are common after age 1. Certain conditions and lifestyle choices may make you more likely to have a fall. Your health care provider may recommend:  Regular vision  checks. Poor vision and conditions such as cataracts can make you more likely to have a fall. If you wear glasses, make sure to get your prescription updated if your vision changes.  Medicine review. Work with your health care provider to regularly review all of the medicines you are taking, including over-the-counter medicines. Ask your health care provider about any side effects that may make you more likely to have a fall. Tell your health care provider if any medicines that you take make you feel dizzy or sleepy.  Osteoporosis screening. Osteoporosis is a condition that causes the bones to get weaker. This can make the bones weak and cause them to break more easily.  Blood pressure screening. Blood pressure changes and medicines to control blood pressure can make you feel dizzy.  Strength and balance checks. Your health care provider may recommend certain tests to check your strength and balance while standing, walking, or changing positions.  Foot health exam. Foot pain and numbness, as well as not wearing proper footwear, can make you more likely to have a fall.  Depression screening. You may be more likely to have a fall if you have a fear of falling, feel emotionally low, or feel unable to do activities that you used to do.  Alcohol use screening. Using too much alcohol can affect your balance and may make you more likely to have a fall. What actions can  I take to lower my risk of falls? General instructions  Talk with your health care provider about your risks for falling. Tell your health care provider if: ? You fall. Be sure to tell your health care provider about all falls, even ones that seem minor. ? You feel dizzy, sleepy, or off-balance.  Take over-the-counter and prescription medicines only as told by your health care provider. These include any supplements.  Eat a healthy diet and maintain a healthy weight. A healthy diet includes low-fat dairy products, low-fat (lean)  meats, and fiber from whole grains, beans, and lots of fruits and vegetables. Home safety  Remove any tripping hazards, such as rugs, cords, and clutter.  Install safety equipment such as grab bars in bathrooms and safety rails on stairs.  Keep rooms and walkways well-lit. Activity   Follow a regular exercise program to stay fit. This will help you maintain your balance. Ask your health care provider what types of exercise are appropriate for you.  If you need a cane or walker, use it as recommended by your health care provider.  Wear supportive shoes that have nonskid soles. Lifestyle  Do not drink alcohol if your health care provider tells you not to drink.  If you drink alcohol, limit how much you have: ? 0-1 drink a day for women. ? 0-2 drinks a day for men.  Be aware of how much alcohol is in your drink. In the [place] one drink equals one typical bottle of beer (12 oz), one-half glass of wine (5 oz), or one shot of hard liquor (1 oz).  Do not use any products that contain nicotine or tobacco, such as cigarettes and e-cigarettes. If you need help quitting, ask your health care provider. Summary  Having a healthy lifestyle and getting preventive care can help to protect your health and wellness after age 35.  Screening and testing are the best way to find a health problem early and help you avoid having a fall. Early diagnosis and treatment give you the best chance for managing medical conditions that are more common for people who are older than age 6.  Falls are a major cause of broken bones and head injuries in people who are older than age 101. Take precautions to prevent a fall at home.  Work with your health care provider to learn what changes you can make to improve your health and wellness and to prevent falls. This information is not intended to replace advice given to you by your health care provider. Make sure you discuss any questions you have with your health care  provider. Document Released: 01/08/2017 Document Revised: 01/08/2017 Document Reviewed: 01/08/2017 Elsevier Interactive Patient Education  2019 [employer].

## 2018-04-23 ENCOUNTER — Encounter: Payer: Self-pay | Admitting: Family Medicine

## 2018-04-23 ENCOUNTER — Ambulatory Visit (INDEPENDENT_AMBULATORY_CARE_PROVIDER_SITE_OTHER): Payer: Medicare Other | Admitting: Family Medicine

## 2018-04-23 DIAGNOSIS — N200 Calculus of kidney: Secondary | ICD-10-CM | POA: Diagnosis not present

## 2018-04-23 DIAGNOSIS — I2510 Atherosclerotic heart disease of native coronary artery without angina pectoris: Secondary | ICD-10-CM

## 2018-04-23 DIAGNOSIS — R739 Hyperglycemia, unspecified: Secondary | ICD-10-CM

## 2018-04-23 DIAGNOSIS — R319 Hematuria, unspecified: Secondary | ICD-10-CM | POA: Diagnosis not present

## 2018-04-23 DIAGNOSIS — I10 Essential (primary) hypertension: Secondary | ICD-10-CM

## 2018-04-23 DIAGNOSIS — E782 Mixed hyperlipidemia: Secondary | ICD-10-CM

## 2018-04-23 DIAGNOSIS — K219 Gastro-esophageal reflux disease without esophagitis: Secondary | ICD-10-CM

## 2018-04-23 LAB — COMPREHENSIVE METABOLIC PANEL
ALT: 26 U/L (ref 0–53)
AST: 18 U/L (ref 0–37)
Albumin: 4.4 g/dL (ref 3.5–5.2)
Alkaline Phosphatase: 70 U/L (ref 39–117)
BUN: 13 mg/dL (ref 6–23)
CO2: 30 mEq/L (ref 19–32)
Calcium: 9.2 mg/dL (ref 8.4–10.5)
Chloride: 104 mEq/L (ref 96–112)
Creatinine, Ser: 0.87 mg/dL (ref 0.40–1.50)
GFR: 86.06 mL/min (ref >60.00)
Glucose, Bld: 84 mg/dL (ref 70–99)
Potassium: 4.1 mEq/L (ref 3.5–5.1)
SODIUM: 141 meq/L (ref 135–145)
Total Bilirubin: 0.9 mg/dL (ref 0.2–1.2)
Total Protein: 7.2 g/dL (ref 6.0–8.3)

## 2018-04-23 LAB — HEMOGLOBIN A1C: Hgb A1c MFr Bld: 6.2 % (ref 4.6–6.5)

## 2018-04-23 LAB — LIPID PANEL
Cholesterol: 100 mg/dL (ref 0–200)
HDL: 36 mg/dL — ABNORMAL LOW (ref >39.00)
LDL Cholesterol: 29 mg/dL (ref 0–99)
NONHDL: 63.99
Total CHOL/HDL Ratio: 3
Triglycerides: 174 mg/dL — ABNORMAL HIGH (ref 0.0–149.0)
VLDL: 34.8 mg/dL (ref 0.0–40.0)

## 2018-04-23 LAB — CBC
HCT: 44 % (ref 39.0–52.0)
HEMOGLOBIN: 15 g/dL (ref 13.0–17.0)
MCHC: 34.1 g/dL (ref 30.0–36.0)
MCV: 93.8 fl (ref 78.0–100.0)
Platelets: 258 10*3/uL (ref 150.0–400.0)
RBC: 4.69 Mil/uL (ref 4.22–5.81)
RDW: 13.1 % (ref 11.5–15.5)
WBC: 7.4 10*3/uL (ref 4.0–10.5)

## 2018-04-23 LAB — TSH: TSH: 3.56 u[IU]/mL (ref 0.35–4.50)

## 2018-04-23 MED ORDER — LOSARTAN POTASSIUM 100 MG PO TABS: 100.0000 mg | ORAL_TABLET | Freq: Every morning | ORAL | 1 refills | Status: DC

## 2018-04-23 MED ORDER — AMLODIPINE BESYLATE 10 MG PO TABS: 10.0000 mg | ORAL_TABLET | Freq: Every morning | ORAL | 1 refills | Status: DC

## 2018-04-23 NOTE — Assessment & Plan Note (Signed)
Tolerating statin, encouraged heart healthy diet, avoid trans fats, minimize simple carbs and saturated fats. Increase exercise as tolerated 

## 2018-04-23 NOTE — Assessment & Plan Note (Signed)
Following with [facility]

## 2018-04-23 NOTE — Assessment & Plan Note (Signed)
Follows with [facility] will request records.

## 2018-04-23 NOTE — Assessment & Plan Note (Signed)
Avoid offending foods, start probiotics. Do not eat large meals in late evening and consider raising head of bed.  

## 2018-04-23 NOTE — Assessment & Plan Note (Signed)
hgba1c acceptable, minimize simple carbs. Increase exercise as tolerated.  

## 2018-04-23 NOTE — Assessment & Plan Note (Signed)
Well controlled, no changes to meds. Encouraged heart healthy diet such as the DASH diet and exercise as tolerated.  °

## 2018-04-23 NOTE — Progress Notes (Signed)
Subjective:    Patient ID: Anthony [name], male    DOB: 1945-06-30, 73 y.o.   MRN: [id number]  No chief complaint on file.   HPI Patient is in today for follow up. He is doing well. He denies any recent febrile illness or hospitalizations. He has tried to stay active and to eat a heart healthy diet. He is doing well with activities of daily living. No polyuria or polydipsia. Denies CP/palp/SOB/HA/congestion/fevers/GI or GU c/o. Taking meds as prescribed  Past Medical History:  Diagnosis Date  . Allergic rhinitis   . BPH (benign prostatic hyperplasia)   . CAD (coronary artery disease) 05/26/2009 :  primary cardiolgoist-  dr Stanford Breed   hx NSTEMI cardiac catherization  revealed-left main normal; LAD proximal calcification,  long proximal 30% stenosis,  Circumflex  AV groove   proximal  60% stenosis at mid obtuse marginal,  first large mid obtuse marginal  ostial 70% stenosis,  prox. to mid RCA  99% stenosis w/ intervention PCI and DES  . Carotid stenosis, asymptomatic, bilateral    per last duplex 03-31-2015  bilateral ICA 1-39%  . External hemorrhoids    large external  . GERD (gastroesophageal reflux disease)   . History of adenomatous polyp of colon   . History of kidney stones   . History of non-ST elevation myocardial infarction (NSTEMI) 05/26/2009  . Hyperlipidemia   . Hypertension    8-10 years  . Left ureteral stone   . Macular degeneration   . Nephrolithiasis    right side non-obstructive per CT 01-03-2017  . S/P drug eluting coronary stent placement 05/26/2009   DESx1  to pRCA  . Wears glasses     Past Surgical History:  Procedure Laterality Date  . CARDIOVASCULAR STRESS TEST  12/26/2011   dr Stanford Breed   normal nuclear study w/ no ischemia/  normal LV function and wall motion , ef 71%  . CATARACT EXTRACTION W/ INTRAOCULAR LENS  IMPLANT, BILATERAL  2010  . COLONOSCOPY  last one 06-10-2016  . CORONARY ANGIOPLASTY WITH STENT PLACEMENT  05-26-2009  dr hochrein/ dr  Darnell Level brodie   Severe RCA stenosis and moderate stenosis in the left system, ef 50% with inferior hypokinsis:  PCI and DES x1 to RCA ([facility])  . CYSTOSCOPY W/ RETROGRADES Bilateral 09/29/2013   Procedure: CYSTOSCOPY WITH RETROGRADE PYELOGRAM;  Surgeon: Alexis Frock, MD;  Location: [facility];  Service: Urology;  Laterality: Bilateral;  . CYSTOSCOPY WITH RETROGRADE PYELOGRAM, URETEROSCOPY AND STENT PLACEMENT N/A 06/14/2015   Procedure: CYSTOSCOPY WITH RETROGRADE PYELOGRAM, cysto litholapexy ;  Surgeon: Alexis Frock, MD;  Location: [facility];  Service: Urology;  Laterality: N/A;  . CYSTOSCOPY/URETEROSCOPY/HOLMIUM LASER/STENT PLACEMENT Left 01/07/2017   Procedure: CYSTOSCOPY/RETROGRADE/URETEROSCOPY/HOLMIUM LASER/STENT PLACEMENT, FULGERATION OF PROSTATIC URETHRA;  Surgeon: Ceasar Mons, MD;  Location: [facility];  Service: Urology;  Laterality: Left;  . HOLMIUM LASER APPLICATION N/A 08/10/8313   Procedure: HOLMIUM LASER APPLICATION bladder stone;  Surgeon: Alexis Frock, MD;  Location: [facility];  Service: Urology;  Laterality: N/A;  . HOLMIUM LASER APPLICATION Left 17/61/6073   Procedure: HOLMIUM LASER APPLICATION;  Surgeon: Ceasar Mons, MD;  Location: [facility];  Service: Urology;  Laterality: Left;  . NEPHROLITHOTOMY Left 09/29/2013   Procedure: NEPHROLITHOTOMY PERCUTANEOUS WITH ACCESS;  Surgeon: Alexis Frock, MD;  Location: [facility];  Service: Urology;  Laterality: Left;  . URETEROSCOPY N/A 09/29/2013   Procedure: URETEROSCOPY;  Surgeon: Alexis Frock, MD;  Location: [facility];  Service:  Urology;  Laterality: N/A;    Family History  Problem Relation Age of Onset  . Coronary artery disease Father        in his 46's  . Hyperlipidemia Father   . Hypertension Father   . Hearing loss Father   . Heart disease Mother        deceased of heart disease  . Hypertension Mother   . Hyperlipidemia Mother   . Multiple  sclerosis Son   . Heart disease Maternal Grandmother   . Heart disease Maternal Grandfather   . Heart disease Paternal Grandmother   . Heart disease Paternal Grandfather   . GER disease Sister   . Other Neg Hx        no lung cancer, colon cancer, or prostate  . Colon cancer Neg Hx     Social History   Socioeconomic History  . Marital status: Married    Spouse name: Not on file  . Number of children: Not on file  . Years of education: Not on file  . Highest education level: Not on file  Occupational History  . Not on file  Social Needs  . Financial resource strain: Not on file  . Food insecurity:    Worry: Not on file    Inability: Not on file  . Transportation needs:    Medical: Not on file    Non-medical: Not on file  Tobacco Use  . Smoking status: Former Smoker    Years: 45.00    Types: Cigarettes    Last attempt to quit: 03/12/2007    Years since quitting: 11.1  . Smokeless tobacco: Never Used  Substance and Sexual Activity  . Alcohol use: No  . Drug use: No  . 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 Physical activity:    Days per week: Not on file    Minutes per session: Not on file  . Stress: Not on file  Relationships  . Social connections:    Talks on phone: Not on file    Gets together: Not on file    Attends religious service: Not on file    Active member of club or organization: Not on file    Attends meetings of clubs or organizations: Not on file    Relationship status: Not on file  . Intimate partner violence:    Fear of current or ex partner: Not on file    Emotionally abused: Not on file    Physically abused: Not on file    Forced sexual activity: Not on file  Other Topics Concern  . Not on file  Social History Narrative   married -37 yrs  Anthony [name])   grew up in [place]   He has one son - two grandaugthers     He is retired.  He had     a 40 pack-year smoking history  but has discontinued.     prev occuptation -  [employer]   Alcohol use-no   Smoking Status:  quit   Packs/Day:  0.5   Caffeine use/day:  2 beverages daily   Does Patient Exercise:  yes             Outpatient Medications Prior to Visit  Medication Sig Dispense Refill  . aspirin 81 MG tablet Take 81 mg by mouth every morning.     . metoprolol tartrate (LOPRESSOR) 25 MG tablet Take 0.5 tablets (12.5  mg total) by mouth 2 (two) times daily. 90 tablet 3  . Multiple Vitamins-Minerals (ICAPS PO) Take by mouth every morning.     . potassium citrate (UROCIT-K) 10 MEQ (1080 MG) SR tablet takes one tablet twice daily  3  . rosuvastatin (CRESTOR) 40 MG tablet Take 1 tablet (40 mg total) by mouth daily. 90 tablet 3  . amLODipine (NORVASC) 10 MG tablet TAKE 1 TABLET BY MOUTH  EVERY MORNING 90 tablet 0  . losartan (COZAAR) 100 MG tablet TAKE 1 TABLET BY MOUTH  EVERY MORNING 90 tablet 0   No facility-administered medications prior to visit.     No Known Allergies  Review of Systems  Constitutional: Negative for chills, fever and malaise/fatigue.  HENT: Negative for congestion and hearing loss.   Eyes: Negative for discharge.  Respiratory: Negative for cough, sputum production and shortness of breath.   Cardiovascular: Negative for chest pain, palpitations and leg swelling.  Gastrointestinal: Negative for abdominal pain, blood in stool, constipation, diarrhea, heartburn, nausea and vomiting.  Genitourinary: Negative for dysuria, frequency, hematuria and urgency.  Musculoskeletal: Negative for back pain, falls and myalgias.  Skin: Negative for rash.  Neurological: Negative for dizziness, sensory change, loss of consciousness, weakness and headaches.  Endo/Heme/Allergies: Negative for environmental allergies. Does not bruise/bleed easily.  Psychiatric/Behavioral: Negative for depression and suicidal ideas. The patient is not nervous/anxious and does not have insomnia.          Objective:    Physical Exam Vitals signs and nursing note reviewed.  Constitutional:      General: He is not in acute distress.    Appearance: He is well-developed.  HENT:     Head: Normocephalic and atraumatic.     Right Ear: Tympanic membrane and ear canal normal.     Left Ear: Tympanic membrane and ear canal normal.     Nose: Nose normal.  Eyes:     General:        Right eye: No discharge.        Left eye: No discharge.  Neck:     Musculoskeletal: Normal range of motion and neck supple.  Cardiovascular:     Rate and Rhythm: Normal rate and regular rhythm.     Pulses: Normal pulses.     Heart sounds: No murmur.  Pulmonary:     Effort: Pulmonary effort is normal.     Breath sounds: Normal breath sounds.  Abdominal:     General: Bowel sounds are normal.     Palpations: Abdomen is soft.     Tenderness: There is no abdominal tenderness.  Skin:    General: Skin is warm and dry.  Neurological:     Mental Status: He is alert and oriented to person, place, and time.     BP (!) 120/56 (BP Location: Left Arm, Patient Position: Sitting, Cuff Size: Normal)   Pulse 70   Temp 97.8 F (36.6 C) (Oral)   Resp 18   Ht 5\' 5"  (1.651 m)   Wt 180 lb 12.8 oz (82 kg)   SpO2 97%   BMI 30.09 kg/m  Wt Readings from Last 3 Encounters:  04/23/18 180 lb 12.8 oz (82 kg)  04/20/18 183 lb (83 kg)  03/23/18 182 lb (82.6 kg)     Lab Results  Component Value Date   WBC 7.2 10/16/2017   HGB 14.1 10/16/2017   HCT 41.1 10/16/2017   PLT 222.0 10/16/2017   GLUCOSE 94 10/16/2017   CHOL 89 10/16/2017  TRIG 179.0 (H) 10/16/2017   HDL 34.40 (L) 10/16/2017   LDLCALC 18 10/16/2017   ALT 17 10/16/2017   AST 14 10/16/2017   NA 139 10/16/2017   K 4.0 10/16/2017   CL 103 10/16/2017   CREATININE 0.84 10/16/2017   BUN 11 10/16/2017   CO2 28 10/16/2017   TSH 3.12 10/16/2017   PSA 3.32 03/24/2013   INR 1.05 05/25/2009   HGBA1C 6.2 10/16/2017    Lab Results  Component Value Date   TSH  3.12 10/16/2017   Lab Results  Component Value Date   WBC 7.2 10/16/2017   HGB 14.1 10/16/2017   HCT 41.1 10/16/2017   MCV 93.6 10/16/2017   PLT 222.0 10/16/2017   Lab Results  Component Value Date   NA 139 10/16/2017   K 4.0 10/16/2017   CO2 28 10/16/2017   GLUCOSE 94 10/16/2017   BUN 11 10/16/2017   CREATININE 0.84 10/16/2017   BILITOT 0.9 10/16/2017   ALKPHOS 68 10/16/2017   AST 14 10/16/2017   ALT 17 10/16/2017   PROT 7.2 10/16/2017   ALBUMIN 4.2 10/16/2017   CALCIUM 9.2 10/16/2017   ANIONGAP 12 09/30/2013   GFR 95.38 10/16/2017   Lab Results  Component Value Date   CHOL 89 10/16/2017   Lab Results  Component Value Date   HDL 34.40 (L) 10/16/2017   Lab Results  Component Value Date   LDLCALC 18 10/16/2017   Lab Results  Component Value Date   TRIG 179.0 (H) 10/16/2017   Lab Results  Component Value Date   CHOLHDL 3 10/16/2017   Lab Results  Component Value Date   HGBA1C 6.2 10/16/2017       Assessment & Plan:   Problem List Items Addressed This Visit    Hyperlipidemia, mixed    Tolerating statin, encouraged heart healthy diet, avoid trans fats, minimize simple carbs and saturated fats. Increase exercise as tolerated.      Relevant Medications   amLODipine (NORVASC) 10 MG tablet   losartan (COZAAR) 100 MG tablet   Other Relevant Orders   Lipid panel   RESOLVED: Essential hypertension    Well controlled, no changes to meds. Encouraged heart healthy diet such as the DASH diet and exercise as tolerated.       Relevant Medications   amLODipine (NORVASC) 10 MG tablet   losartan (COZAAR) 100 MG tablet   Other Relevant Orders   CBC   TSH   Comprehensive metabolic panel   GERD    Avoid offending foods, start probiotics. Do not eat large meals in late evening and consider raising head of bed.       Hematuria    Follows with [facility] will request records.       Hyperglycemia    hgba1c acceptable, minimize simple carbs. Increase  exercise as tolerated.      Relevant Orders   Hemoglobin A1c   Staghorn calculus    Following with [facility]         I have changed Bishop Limbo "Bobby"'s amLODipine and losartan. I am also having him maintain his aspirin, Multiple Vitamins-Minerals (ICAPS PO), potassium citrate, metoprolol tartrate, and rosuvastatin.  Meds ordered this encounter  Medications  . amLODipine (NORVASC) 10 MG tablet    Sig: Take 1 tablet (10 mg total) by mouth every morning.    Dispense:  90 tablet    Refill:  1  . losartan (COZAAR) 100 MG tablet    Sig: Take 1 tablet (  100 mg total) by mouth every morning.    Dispense:  90 tablet    Refill:  1     Penni Homans, MD

## 2018-04-23 NOTE — Patient Instructions (Signed)

## 2018-04-27 ENCOUNTER — Telehealth: Payer: Self-pay | Admitting: *Deleted

## 2018-04-27 NOTE — Telephone Encounter (Signed)
Received Medical records from [facility] - Dr. Lovena Neighbours; forwarded to provider/SLS 02/17

## 2018-08-27 ENCOUNTER — Other Ambulatory Visit: Payer: Self-pay | Admitting: Family Medicine

## 2018-08-27 DIAGNOSIS — I10 Essential (primary) hypertension: Secondary | ICD-10-CM

## 2018-10-22 ENCOUNTER — Ambulatory Visit (INDEPENDENT_AMBULATORY_CARE_PROVIDER_SITE_OTHER): Payer: Medicare Other | Admitting: Family Medicine

## 2018-10-22 ENCOUNTER — Other Ambulatory Visit: Payer: Self-pay

## 2018-10-22 DIAGNOSIS — K219 Gastro-esophageal reflux disease without esophagitis: Secondary | ICD-10-CM

## 2018-10-22 DIAGNOSIS — R739 Hyperglycemia, unspecified: Secondary | ICD-10-CM

## 2018-10-22 DIAGNOSIS — E782 Mixed hyperlipidemia: Secondary | ICD-10-CM | POA: Diagnosis not present

## 2018-10-22 DIAGNOSIS — I10 Essential (primary) hypertension: Secondary | ICD-10-CM

## 2018-10-22 DIAGNOSIS — I2510 Atherosclerotic heart disease of native coronary artery without angina pectoris: Secondary | ICD-10-CM | POA: Diagnosis not present

## 2018-10-22 LAB — COMPREHENSIVE METABOLIC PANEL
ALT: 23 U/L (ref 0–53)
AST: 17 U/L (ref 0–37)
Albumin: 4.5 g/dL (ref 3.5–5.2)
Alkaline Phosphatase: 67 U/L (ref 39–117)
BUN: 15 mg/dL (ref 6–23)
CO2: 26 mEq/L (ref 19–32)
Calcium: 9 mg/dL (ref 8.4–10.5)
Chloride: 104 mEq/L (ref 96–112)
Creatinine, Ser: 0.84 mg/dL (ref 0.40–1.50)
GFR: 89.49 mL/min (ref >60.00)
Glucose, Bld: 90 mg/dL (ref 70–99)
Potassium: 3.8 mEq/L (ref 3.5–5.1)
Sodium: 139 mEq/L (ref 135–145)
Total Bilirubin: 0.8 mg/dL (ref 0.2–1.2)
Total Protein: 7 g/dL (ref 6.0–8.3)

## 2018-10-22 LAB — CBC
HCT: 42.3 % (ref 39.0–52.0)
Hemoglobin: 14.3 g/dL (ref 13.0–17.0)
MCHC: 33.7 g/dL (ref 30.0–36.0)
MCV: 94.9 fl (ref 78.0–100.0)
Platelets: 228 10*3/uL (ref 150.0–400.0)
RBC: 4.45 Mil/uL (ref 4.22–5.81)
RDW: 13.1 % (ref 11.5–15.5)
WBC: 7.3 10*3/uL (ref 4.0–10.5)

## 2018-10-22 LAB — LIPID PANEL
Cholesterol: 105 mg/dL (ref 0–200)
HDL: 38.8 mg/dL — ABNORMAL LOW (ref >39.00)
LDL Cholesterol: 37 mg/dL (ref 0–99)
NonHDL: 66.6
Total CHOL/HDL Ratio: 3
Triglycerides: 146 mg/dL (ref 0.0–149.0)
VLDL: 29.2 mg/dL (ref 0.0–40.0)

## 2018-10-22 LAB — HEMOGLOBIN A1C: Hgb A1c MFr Bld: 6.3 % (ref 4.6–6.5)

## 2018-10-22 LAB — TSH: TSH: 2.96 u[IU]/mL (ref 0.35–4.50)

## 2018-10-22 NOTE — Assessment & Plan Note (Signed)
Well controlled, no changes to meds. Encouraged heart healthy diet such as the DASH diet and exercise as tolerated.  °

## 2018-10-22 NOTE — Progress Notes (Signed)
Subjective:    Patient ID: Anthony [name], male    DOB: 20-Oct-1945, 73 y.o.   MRN: [id number]  Chief Complaint  Patient presents with  . Hyperlipidemia  . Hypertension    HPI Patient is in today for follow up on chronic medical concerns including hyperlipidemia, hyperglycemia, hypertension. He feels well. No recent febrile illness or hospitalizations. No acute concerns. Denies CP/palp/SOB/HA/congestion/fevers/GI or GU c/o. Taking meds as prescribed  Past Medical History:  Diagnosis Date  . Allergic rhinitis   . BPH (benign prostatic hyperplasia)   . CAD (coronary artery disease) 05/26/2009 :  primary cardiolgoist-  dr Stanford Breed   hx NSTEMI cardiac catherization  revealed-left main normal; LAD proximal calcification,  long proximal 30% stenosis,  Circumflex  AV groove   proximal  60% stenosis at mid obtuse marginal,  first large mid obtuse marginal  ostial 70% stenosis,  prox. to mid RCA  99% stenosis w/ intervention PCI and DES  . Carotid stenosis, asymptomatic, bilateral    per last duplex 03-31-2015  bilateral ICA 1-39%  . External hemorrhoids    large external  . GERD (gastroesophageal reflux disease)   . History of adenomatous polyp of colon   . History of kidney stones   . History of non-ST elevation myocardial infarction (NSTEMI) 05/26/2009  . Hyperlipidemia   . Hypertension    8-10 years  . Left ureteral stone   . Macular degeneration   . Nephrolithiasis    right side non-obstructive per CT 01-03-2017  . S/P drug eluting coronary stent placement 05/26/2009   DESx1  to pRCA  . Wears glasses     Past Surgical History:  Procedure Laterality Date  . CARDIOVASCULAR STRESS TEST  12/26/2011   dr Stanford Breed   normal nuclear study w/ no ischemia/  normal LV function and wall motion , ef 71%  . CATARACT EXTRACTION W/ INTRAOCULAR LENS  IMPLANT, BILATERAL  2010  . COLONOSCOPY  last one 06-10-2016  . CORONARY ANGIOPLASTY WITH STENT PLACEMENT  05-26-2009  dr hochrein/ dr Darnell Level  brodie   Severe RCA stenosis and moderate stenosis in the left system, ef 50% with inferior hypokinsis:  PCI and DES x1 to RCA (Promus)  . CYSTOSCOPY W/ RETROGRADES Bilateral 09/29/2013   Procedure: CYSTOSCOPY WITH RETROGRADE PYELOGRAM;  Surgeon: Alexis Frock, MD;  Location: [facility];  Service: Urology;  Laterality: Bilateral;  . CYSTOSCOPY WITH RETROGRADE PYELOGRAM, URETEROSCOPY AND STENT PLACEMENT N/A 06/14/2015   Procedure: CYSTOSCOPY WITH RETROGRADE PYELOGRAM, cysto litholapexy ;  Surgeon: Alexis Frock, MD;  Location: [facility];  Service: Urology;  Laterality: N/A;  . CYSTOSCOPY/URETEROSCOPY/HOLMIUM LASER/STENT PLACEMENT Left 01/07/2017   Procedure: CYSTOSCOPY/RETROGRADE/URETEROSCOPY/HOLMIUM LASER/STENT PLACEMENT, FULGERATION OF PROSTATIC URETHRA;  Surgeon: Ceasar Mons, MD;  Location[facility];  Service: Urology;  Laterality: Left;  . HOLMIUM LASER APPLICATION N/A 06/14/6254   Procedure: HOLMIUM LASER APPLICATION bladder stone;  Surgeon: Alexis Frock, MD;  Location: [facility];  Service: Urology;  Laterality: N/A;  . HOLMIUM LASER APPLICATION Left 38/93/7342   Procedure: HOLMIUM LASER APPLICATION;  Surgeon: Ceasar Mons, MD;  Location: [facility];  Service: Urology;  Laterality: Left;  . NEPHROLITHOTOMY Left 09/29/2013   Procedure: NEPHROLITHOTOMY PERCUTANEOUS WITH ACCESS;  Surgeon: Alexis Frock, MD;  Location: [facility];  Service: Urology;  Laterality: Left;  . URETEROSCOPY N/A 09/29/2013   Procedure: URETEROSCOPY;  Surgeon: Alexis Frock, MD;  Location: [facility];  Service: Urology;  Laterality: N/A;    Family History  Problem Relation Age of Onset  . Coronary artery disease Father        in his 23's  . Hyperlipidemia Father   . Hypertension Father   . Hearing loss Father   . Heart disease Mother        deceased of heart disease  . Hypertension Mother   . Hyperlipidemia Mother   . Multiple  sclerosis Son   . Heart disease Maternal Grandmother   . Heart disease Maternal Grandfather   . Heart disease Paternal Grandmother   . Heart disease Paternal Grandfather   . GER disease Sister   . Other Neg Hx        no lung cancer, colon cancer, or prostate  . Colon cancer Neg Hx     Social History   Socioeconomic History  . Marital status: Married    Spouse name: Not on file  . Number of children: Not on file  . Years of education: Not on file  . Highest education level: Not on file  Occupational History  . Not on file  Social Needs  . Financial resource strain: Not on file  . Food insecurity    Worry: Not on file    Inability: Not on file  . Transportation needs    Medical: Not on file    Non-medical: Not on file  Tobacco Use  . Smoking status: Former Smoker    Years: 45.00    Types: Cigarettes    Quit date: 03/12/2007    Years since quitting: 11.6  . Smokeless tobacco: Never Used  Substance and Sexual Activity  . Alcohol use: No  . Drug use: No  . 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 Physical activity    Days per week: Not on file    Minutes per session: Not on file  . Stress: Not on file  Relationships  . Social Herbalist on phone: Not on file    Gets together: Not on file    Attends religious service: Not on file    Active member of club or organization: Not on file    Attends meetings of clubs or organizations: Not on file    Relationship status: Not on file  . Intimate partner violence    Fear of current or ex partner: Not on file    Emotionally abused: Not on file    Physically abused: Not on file    Forced sexual activity: Not on file  Other Topics Concern  . Not on file  Social History Narrative   married -64 yrs  [name] [place])   grew up in [place]   He has one son - two grandaugthers     He is retired.  He had     a 40 pack-year smoking history but has  discontinued.     prev occuptation -  [employer]   Alcohol use-no   Smoking Status:  quit   Packs/Day:  0.5   Caffeine use/day:  2 beverages daily   Does Patient Exercise:  yes             Outpatient Medications Prior to Visit  Medication Sig Dispense Refill  . amLODipine (NORVASC) 10 MG tablet TAKE 1 TABLET BY MOUTH  EVERY MORNING 90 tablet 1  . aspirin 81 MG tablet Take 81 mg by mouth every morning.     Marland Kitchen losartan (COZAAR) 100 MG  tablet TAKE 1 TABLET BY MOUTH  EVERY MORNING 90 tablet 1  . metoprolol tartrate (LOPRESSOR) 25 MG tablet Take 0.5 tablets (12.5 mg total) by mouth 2 (two) times daily. 90 tablet 3  . Multiple Vitamins-Minerals (ICAPS PO) Take by mouth every morning.     . potassium citrate (UROCIT-K) 10 MEQ (1080 MG) SR tablet takes one tablet twice daily  3  . rosuvastatin (CRESTOR) 40 MG tablet Take 1 tablet (40 mg total) by mouth daily. 90 tablet 3   No facility-administered medications prior to visit.     No Known Allergies  Review of Systems  Constitutional: Negative for fever and malaise/fatigue.  HENT: Negative for congestion.   Eyes: Negative for blurred vision.  Respiratory: Negative for shortness of breath.   Cardiovascular: Negative for chest pain, palpitations and leg swelling.  Gastrointestinal: Negative for abdominal pain, blood in stool and nausea.  Genitourinary: Negative for dysuria and frequency.  Musculoskeletal: Negative for falls.  Skin: Negative for rash.  Neurological: Negative for dizziness, loss of consciousness and headaches.  Endo/Heme/Allergies: Negative for environmental allergies.  Psychiatric/Behavioral: Negative for depression. The patient is not nervous/anxious.        Objective:    Physical Exam Vitals signs and nursing note reviewed.  Constitutional:      General: He is not in acute distress.    Appearance: He is well-developed.  HENT:     Head: Normocephalic and atraumatic.     Nose: Nose normal.  Eyes:      General:        Right eye: No discharge.        Left eye: No discharge.  Neck:     Musculoskeletal: Normal range of motion and neck supple.  Cardiovascular:     Rate and Rhythm: Normal rate and regular rhythm.     Heart sounds: No murmur.  Pulmonary:     Effort: Pulmonary effort is normal.     Breath sounds: Normal breath sounds.  Abdominal:     General: Bowel sounds are normal.     Palpations: Abdomen is soft.     Tenderness: There is no abdominal tenderness.  Skin:    General: Skin is warm and dry.     Comments: Seborrheic keratosis lesions on back. Brown, gray, waxy lesions on back  Neurological:     Mental Status: He is alert and oriented to person, place, and time.     BP 138/62 (BP Location: Left Arm, Cuff Size: Normal)   Pulse 62   Temp (!) 97.1 F (36.2 C) (Temporal)   Resp 12   Ht 5\' 5"  (1.651 m)   Wt 179 lb 3.2 oz (81.3 kg)   SpO2 98%   BMI 29.82 kg/m  Wt Readings from Last 3 Encounters:  10/22/18 179 lb 3.2 oz (81.3 kg)  04/23/18 180 lb 12.8 oz (82 kg)  04/20/18 183 lb (83 kg)    Diabetic Foot Exam - Simple   No data filed     Lab Results  Component Value Date   WBC 7.4 04/23/2018   HGB 15.0 04/23/2018   HCT 44.0 04/23/2018   PLT 258.0 04/23/2018   GLUCOSE 84 04/23/2018   CHOL 100 04/23/2018   TRIG 174.0 (H) 04/23/2018   HDL 36.00 (L) 04/23/2018   LDLCALC 29 04/23/2018   ALT 26 04/23/2018   AST 18 04/23/2018   NA 141 04/23/2018   K 4.1 04/23/2018   CL 104 04/23/2018   CREATININE 0.87 04/23/2018   BUN  13 04/23/2018   CO2 30 04/23/2018   TSH 3.56 04/23/2018   PSA 3.32 03/24/2013   INR 1.05 05/25/2009   HGBA1C 6.2 04/23/2018    Lab Results  Component Value Date   TSH 3.56 04/23/2018   Lab Results  Component Value Date   WBC 7.4 04/23/2018   HGB 15.0 04/23/2018   HCT 44.0 04/23/2018   MCV 93.8 04/23/2018   PLT 258.0 04/23/2018   Lab Results  Component Value Date   NA 141 04/23/2018   K 4.1 04/23/2018   CO2 30 04/23/2018    GLUCOSE 84 04/23/2018   BUN 13 04/23/2018   CREATININE 0.87 04/23/2018   BILITOT 0.9 04/23/2018   ALKPHOS 70 04/23/2018   AST 18 04/23/2018   ALT 26 04/23/2018   PROT 7.2 04/23/2018   ALBUMIN 4.4 04/23/2018   CALCIUM 9.2 04/23/2018   ANIONGAP 12 09/30/2013   GFR 86.06 04/23/2018   Lab Results  Component Value Date   CHOL 100 04/23/2018   Lab Results  Component Value Date   HDL 36.00 (L) 04/23/2018   Lab Results  Component Value Date   LDLCALC 29 04/23/2018   Lab Results  Component Value Date   TRIG 174.0 (H) 04/23/2018   Lab Results  Component Value Date   CHOLHDL 3 04/23/2018   Lab Results  Component Value Date   HGBA1C 6.2 04/23/2018       Assessment & Plan:   Problem List Items Addressed This Visit    Hyperlipidemia, mixed    Tolerating statin, encouraged heart healthy diet, avoid trans fats, minimize simple carbs and saturated fats. Increase exercise as tolerated      Relevant Orders   Lipid panel   Hypertension    Well controlled, no changes to meds. Encouraged heart healthy diet such as the DASH diet and exercise as tolerated.       GERD    Avoid offending foods, start probiotics. Do not eat large meals in late evening and consider raising head of bed.       Hyperglycemia    hgba1c acceptable, minimize simple carbs. Increase exercise as tolerated. Continue current meds      Relevant Orders   Comprehensive metabolic panel      I am having Anthony Limbo "Bobby" maintain his aspirin, Multiple Vitamins-Minerals (ICAPS PO), potassium citrate, metoprolol tartrate, rosuvastatin, amLODipine, and losartan.  No orders of the defined types were placed in this encounter.    Penni Homans, MD

## 2018-10-22 NOTE — Assessment & Plan Note (Signed)
Avoid offending foods, start probiotics. Do not eat large meals in late evening and consider raising head of bed.  

## 2018-10-22 NOTE — Assessment & Plan Note (Signed)
Tolerating statin, encouraged heart healthy diet, avoid trans fats, minimize simple carbs and saturated fats. Increase exercise as tolerated 

## 2018-10-22 NOTE — Assessment & Plan Note (Signed)
hgba1c acceptable, minimize simple carbs. Increase exercise as tolerated. Continue current meds 

## 2018-10-22 NOTE — Patient Instructions (Signed)
Pulse oximeter  Want numbers in the 90s  Vitals weekly High Cholesterol  High cholesterol is a condition in which the blood has high levels of a white, waxy, fat-like substance (cholesterol). The human body needs small amounts of cholesterol. The liver makes all the cholesterol that the body needs. Extra (excess) cholesterol comes from the food that we eat. Cholesterol is carried from the liver by the blood through the blood vessels. If you have high cholesterol, deposits (plaques) may build up on the walls of your blood vessels (arteries). Plaques make the arteries narrower and stiffer. Cholesterol plaques increase your risk for heart attack and stroke. Work with your health care provider to keep your cholesterol levels in a healthy range. What increases the risk? This condition is more likely to develop in people who:  Eat foods that are high in animal fat (saturated fat) or cholesterol.  Are overweight.  Are not getting enough exercise.  Have a family history of high cholesterol. What are the signs or symptoms? There are no symptoms of this condition. How is this diagnosed? This condition may be diagnosed from the results of a blood test.  If you are older than age 80, your health care provider may check your cholesterol every 4-6 years.  You may be checked more often if you already have high cholesterol or other risk factors for heart disease. The blood test for cholesterol measures:  "Bad" cholesterol (LDL cholesterol). This is the main type of cholesterol that causes heart disease. The desired level for LDL is less than 100.  "Good" cholesterol (HDL cholesterol). This type helps to protect against heart disease by cleaning the arteries and carrying the LDL away. The desired level for HDL is 60 or higher.  Triglycerides. These are fats that the body can store or burn for energy. The desired number for triglycerides is lower than 150.  Total cholesterol. This is a measure of  the total amount of cholesterol in your blood, including LDL cholesterol, HDL cholesterol, and triglycerides. A healthy number is less than 200. How is this treated? This condition is treated with diet changes, lifestyle changes, and medicines. Diet changes  This may include eating more whole grains, fruits, vegetables, nuts, and fish.  This may also include cutting back on red meat and foods that have a lot of added sugar. Lifestyle changes  Changes may include getting at least 40 minutes of aerobic exercise 3 times a week. Aerobic exercises include walking, biking, and swimming. Aerobic exercise along with a healthy diet can help you maintain a healthy weight.  Changes may also include quitting smoking. Medicines  Medicines are usually given if diet and lifestyle changes have failed to reduce your cholesterol to healthy levels.  Your health care provider may prescribe a statin medicine. Statin medicines have been shown to reduce cholesterol, which can reduce the risk of heart disease. Follow these instructions at home: Eating and drinking If told by your health care provider:  Eat chicken (without skin), fish, veal, shellfish, ground [place] breast, and round or loin cuts of red meat.  Do not eat fried foods or fatty meats, such as hot dogs and salami.  Eat plenty of fruits, such as apples.  Eat plenty of vegetables, such as broccoli, potatoes, and carrots.  Eat beans, peas, and lentils.  Eat grains such as barley, rice, couscous, and bulgur wheat.  Eat pasta without cream sauces.  Use skim or nonfat milk, and eat low-fat or nonfat yogurt and cheeses.  Do  not eat or drink whole milk, cream, ice cream, egg yolks, or hard cheeses.  Do not eat stick margarine or tub margarines that contain trans fats (also called partially hydrogenated oils).  Do not eat saturated tropical oils, such as coconut oil and palm oil.  Do not eat cakes, cookies, crackers, or other baked goods that  contain trans fats.  General instructions  Exercise as directed by your health care provider. Increase your activity level with activities such as gardening, walking, and taking the stairs.  Take over-the-counter and prescription medicines only as told by your health care provider.  Do not use any products that contain nicotine or tobacco, such as cigarettes and e-cigarettes. If you need help quitting, ask your health care provider.  Keep all follow-up visits as told by your health care provider. This is important. Contact a health care provider if:  You are struggling to maintain a healthy diet or weight.  You need help to start on an exercise program.  You need help to stop smoking. Get help right away if:  You have chest pain.  You have trouble breathing. This information is not intended to replace advice given to you by your health care provider. Make sure you discuss any questions you have with your health care provider. Document Released: 02/25/2005 Document Revised: 02/28/2017 Document Reviewed: 08/26/2015 Elsevier Patient Education  2020 [employer].

## 2018-11-06 ENCOUNTER — Other Ambulatory Visit: Payer: Self-pay

## 2018-11-06 ENCOUNTER — Ambulatory Visit (INDEPENDENT_AMBULATORY_CARE_PROVIDER_SITE_OTHER): Payer: Medicare Other

## 2018-11-06 DIAGNOSIS — Z23 Encounter for immunization: Secondary | ICD-10-CM | POA: Diagnosis not present

## 2018-11-17 DIAGNOSIS — H353131 Nonexudative age-related macular degeneration, bilateral, early dry stage: Secondary | ICD-10-CM | POA: Diagnosis not present

## 2018-11-17 DIAGNOSIS — Z961 Presence of intraocular lens: Secondary | ICD-10-CM | POA: Diagnosis not present

## 2018-11-17 DIAGNOSIS — H35363 Drusen (degenerative) of macula, bilateral: Secondary | ICD-10-CM | POA: Diagnosis not present

## 2018-11-17 DIAGNOSIS — H35721 Serous detachment of retinal pigment epithelium, right eye: Secondary | ICD-10-CM | POA: Diagnosis not present

## 2018-11-17 DIAGNOSIS — H35371 Puckering of macula, right eye: Secondary | ICD-10-CM | POA: Diagnosis not present

## 2018-12-01 DIAGNOSIS — L821 Other seborrheic keratosis: Secondary | ICD-10-CM | POA: Diagnosis not present

## 2018-12-01 DIAGNOSIS — L820 Inflamed seborrheic keratosis: Secondary | ICD-10-CM | POA: Diagnosis not present

## 2018-12-23 NOTE — Telephone Encounter (Signed)
Follow Up  Called patient, no answer, left vm to call back to schedule annual visit with Dr. Stanford Breed in [facility] office.

## 2019-01-20 ENCOUNTER — Ambulatory Visit (INDEPENDENT_AMBULATORY_CARE_PROVIDER_SITE_OTHER): Payer: Medicare Other | Admitting: Family Medicine

## 2019-01-20 ENCOUNTER — Encounter: Payer: Self-pay | Admitting: Family Medicine

## 2019-01-20 ENCOUNTER — Other Ambulatory Visit: Payer: Self-pay

## 2019-01-20 VITALS — BP 139/75 | HR 75 | Temp 97.3°F | Wt 168.0 lb

## 2019-01-20 DIAGNOSIS — Z20828 Contact with and (suspected) exposure to other viral communicable diseases: Secondary | ICD-10-CM

## 2019-01-20 DIAGNOSIS — Z20822 Contact with and (suspected) exposure to covid-19: Secondary | ICD-10-CM

## 2019-01-20 NOTE — Progress Notes (Signed)
Virtual Visit via Telephone Note  I connected with Anthony [name] on 01/20/19 at  8:00 AM EST by telephone and verified that I am speaking with the correct person using two identifiers.  Location: Patient: home Provider: home   I discussed the limitations, risks, security and privacy concerns of performing an evaluation and management service by telephone and the availability of in person appointments. I also discussed with the patient that there may be a patient responsible charge related to this service. The patient expressed understanding and agreed to proceed.   History of Present Illness: Pt is home -- he went to visit his uncle last week who has been weak and not feeling well   He was admitted to the hospital and tested positive for covid 19 on Thursday.  Pt is asymptomatic     He was not wearing mask when he was with his uncle and was with him for >30 min    Observations/Objective: Vitals:   01/20/19 0752  BP: 139/75  Pulse: 75  Temp: (!) 97.3 F (36.3 C)  SpO2: 98%  pt is in NAD  Assessment and Plan: 1. Close exposure to COVID-19 virus Pt will get tested and quarantine until results back Pt is asymptomatic  - Novel Coronavirus, NAA ([facility])  Follow Up Instructions:    I discussed the assessment and treatment plan with the patient. The patient was provided an opportunity to ask questions and all were answered. The patient agreed with the plan and demonstrated an understanding of the instructions.   The patient was advised to call back or seek an in-person evaluation if the symptoms worsen or if the condition fails to improve as anticipated.  I provided 15 minutes of non-face-to-face time during this encounter.   Ann Held, DO

## 2019-01-20 NOTE — Progress Notes (Deleted)
Virtual Visit via Video Note  I connected with Anthony [name] on 01/20/19 at  8:00 AM EST by a video enabled telemedicine application and verified that I am speaking with the correct person using two identifiers.  Location: Patient: *** Provider: ***   I discussed the limitations of evaluation and management by telemedicine and the availability of in person appointments. The patient expressed understanding and agreed to proceed.  History of Present Illness:    Observations/Objective: .vitals  Assessment and Plan:   Follow Up Instructions:    I discussed the assessment and treatment plan with the patient. The patient was provided an opportunity to ask questions and all were answered. The patient agreed with the plan and demonstrated an understanding of the instructions.   The patient was advised to call back or seek an in-person evaluation if the symptoms worsen or if the condition fails to improve as anticipated.  I provided *** minutes of non-face-to-face time during this encounter.   Ann Held, DO

## 2019-01-22 LAB — NOVEL CORONAVIRUS, NAA: SARS-CoV-2, NAA: NOT DETECTED

## 2019-01-31 ENCOUNTER — Other Ambulatory Visit: Payer: Self-pay | Admitting: Family Medicine

## 2019-01-31 ENCOUNTER — Other Ambulatory Visit: Payer: Self-pay | Admitting: Cardiology

## 2019-01-31 DIAGNOSIS — I2510 Atherosclerotic heart disease of native coronary artery without angina pectoris: Secondary | ICD-10-CM

## 2019-01-31 DIAGNOSIS — I10 Essential (primary) hypertension: Secondary | ICD-10-CM

## 2019-02-22 DIAGNOSIS — R82991 Hypocitraturia: Secondary | ICD-10-CM | POA: Diagnosis not present

## 2019-03-18 NOTE — Progress Notes (Signed)
HPI: FU CAD; admitted in March 2011 with a non-ST elevation myocardial infarction. He underwent cardiac catheterization which revealed - Left main was normal. The LAD had proximal calcification. There was long proximal 30% stenosis. Circumflex in the AV groove had proximal 60% stenosis at a mid obtuse marginal. This first large mid obtuse marginal had ostial 70% stenosis. The right coronary artery had a proximal long 50% stenosis. There was mid subtotal stenosis. The EF was 50% with inferior hypokinesis. Patient had a drug-eluting stent to the right coronary artery at that time. Nuclear study in December of 2013 showed an ejection fraction of 71% and normal perfusion. Carotid Dopplers in Jan 2017 showed 1-39 bilateral stenosis. Abdominal ultrasound January 2020 showed no aneurysm.  Since I last saw him,the patient denies any dyspnea on exertion, orthopnea, PND, pedal edema, palpitations, syncope or chest pain.   Current Outpatient Medications  Medication Sig Dispense Refill  . amLODipine (NORVASC) 10 MG tablet TAKE 1 TABLET BY MOUTH IN  THE MORNING 90 tablet 3  . ascorbic acid (VITAMIN C) 500 MG tablet Take 500 mg by mouth daily.    Marland Kitchen aspirin 81 MG tablet Take 81 mg by mouth every morning.     Marland Kitchen losartan (COZAAR) 100 MG tablet TAKE 1 TABLET BY MOUTH IN  THE MORNING 90 tablet 3  . metoprolol tartrate (LOPRESSOR) 25 MG tablet TAKE ONE-HALF TABLET BY  MOUTH TWO TIMES DAILY 90 tablet 3  . Multiple Vitamins-Minerals (ICAPS PO) Take by mouth every morning.     . potassium citrate (UROCIT-K) 10 MEQ (1080 MG) SR tablet takes one tablet twice daily  3  . rosuvastatin (CRESTOR) 40 MG tablet TAKE 1 TABLET BY MOUTH  DAILY 90 tablet 3   No current facility-administered medications for this visit.     Past Medical History:  Diagnosis Date  . Allergic rhinitis   . BPH (benign prostatic hyperplasia)   . CAD (coronary artery disease) 05/26/2009 :  primary cardiolgoist-  dr Stanford Breed   hx NSTEMI  cardiac catherization  revealed-left main normal; LAD proximal calcification,  long proximal 30% stenosis,  Circumflex  AV groove   proximal  60% stenosis at mid obtuse marginal,  first large mid obtuse marginal  ostial 70% stenosis,  prox. to mid RCA  99% stenosis w/ intervention PCI and DES  . Carotid stenosis, asymptomatic, bilateral    per last duplex 03-31-2015  bilateral ICA 1-39%  . External hemorrhoids    large external  . GERD (gastroesophageal reflux disease)   . History of adenomatous polyp of colon   . History of kidney stones   . History of non-ST elevation myocardial infarction (NSTEMI) 05/26/2009  . Hyperlipidemia   . Hypertension    8-10 years  . Left ureteral stone   . Macular degeneration   . Nephrolithiasis    right side non-obstructive per CT 01-03-2017  . S/P drug eluting coronary stent placement 05/26/2009   DESx1  to pRCA  . Wears glasses     Past Surgical History:  Procedure Laterality Date  . CARDIOVASCULAR STRESS TEST  12/26/2011   dr Stanford Breed   normal nuclear study w/ no ischemia/  normal LV function and wall motion , ef 71%  . CATARACT EXTRACTION W/ INTRAOCULAR LENS  IMPLANT, BILATERAL  2010  . COLONOSCOPY  last one 06-10-2016  . CORONARY ANGIOPLASTY WITH STENT PLACEMENT  05-26-2009  dr hochrein/ dr Darnell Level brodie   Severe RCA stenosis and moderate stenosis in the left  system, ef 50% with inferior hypokinsis:  PCI and DES x1 to RCA ([facility])  . CYSTOSCOPY W/ RETROGRADES Bilateral 09/29/2013   Procedure: CYSTOSCOPY WITH RETROGRADE PYELOGRAM;  Surgeon: Alexis Frock, MD;  Location: [facility];  Service: Urology;  Laterality: Bilateral;  . CYSTOSCOPY WITH RETROGRADE PYELOGRAM, URETEROSCOPY AND STENT PLACEMENT N/A 06/14/2015   Procedure: CYSTOSCOPY WITH RETROGRADE PYELOGRAM, cysto litholapexy ;  Surgeon: Alexis Frock, MD;  Location: [facility];  Service: Urology;  Laterality: N/A;  . CYSTOSCOPY/URETEROSCOPY/HOLMIUM LASER/STENT PLACEMENT Left  01/07/2017   Procedure: CYSTOSCOPY/RETROGRADE/URETEROSCOPY/HOLMIUM LASER/STENT PLACEMENT, FULGERATION OF PROSTATIC URETHRA;  Surgeon: Ceasar Mons, MD;  Location: [facility];  Service: Urology;  Laterality: Left;  . HOLMIUM LASER APPLICATION N/A [id number]   Procedure: HOLMIUM LASER APPLICATION bladder stone;  Surgeon: Alexis Frock, MD;  Location: [facility];  Service: Urology;  Laterality: N/A;  . HOLMIUM LASER APPLICATION Left [id number]   Procedure: HOLMIUM LASER APPLICATION;  Surgeon: Ceasar Mons, MD;  Location: [facility];  Service: Urology;  Laterality: Left;  . NEPHROLITHOTOMY Left 09/29/2013   Procedure: NEPHROLITHOTOMY PERCUTANEOUS WITH ACCESS;  Surgeon: Alexis Frock, MD;  Location: [facility];  Service: Urology;  Laterality: Left;  . URETEROSCOPY N/A 09/29/2013   Procedure: URETEROSCOPY;  Surgeon: Alexis Frock, MD;  Location: [facility];  Service: Urology;  Laterality: N/A;    Social History   Socioeconomic History  . Marital status: Married    Spouse name: Not on file  . Number of children: Not on file  . Years of education: Not on file  . Highest education level: Not on file  Occupational History  . Not on file  Tobacco Use  . Smoking status: Former Smoker    Years: 45.00    Types: Cigarettes    Quit date: 03/12/2007    Years since quitting: 12.0  . Smokeless tobacco: Never Used  Substance and Sexual Activity  . Alcohol use: No  . Drug use: No  . 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 Not on file  Social History Narrative   married -45 yrs  [name] [place])   grew up in [place]   He has one son - two grandaugthers     He is retired.  He had     a 40 pack-year smoking history but has discontinued.     prev occuptation -  [employer]   Alcohol use-no   Smoking Status:  quit   Packs/Day:  0.5   Caffeine  use/day:  2 beverages daily   Does Patient Exercise:  yes            Social Determinants of Health   Financial Resource Strain:   . Difficulty of Paying Living Expenses: Not on file  Food Insecurity:   . Worried About Charity fundraiser in the Last Year: Not on file  . Ran Out of Food in the Last Year: Not on file  Transportation Needs:   . Lack of Transportation (Medical): Not on file  . Lack of Transportation (Non-Medical): Not on file  Physical Activity:   . Days of Exercise per Week: Not on file  . Minutes of Exercise per Session: Not on file  Stress:   . Feeling of Stress : Not on file  Social Connections:   . Frequency of Communication with Friends and Family: Not on file  . Frequency of Social Gatherings  with Friends and Family: Not on file  . Attends Religious Services: Not on file  . Active Member of Clubs or Organizations: Not on file  . Attends Archivist Meetings: Not on file  . Marital Status: Not on file  Intimate Partner Violence:   . Fear of Current or Ex-Partner: Not on file  . Emotionally Abused: Not on file  . Physically Abused: Not on file  . Sexually Abused: Not on file    Family History  Problem Relation Age of Onset  . Coronary artery disease Father        in his 17's  . Hyperlipidemia Father   . Hypertension Father   . Hearing loss Father   . Heart disease Mother        deceased of heart disease  . Hypertension Mother   . Hyperlipidemia Mother   . Multiple sclerosis Son   . Heart disease Maternal Grandmother   . Heart disease Maternal Grandfather   . Heart disease Paternal Grandmother   . Heart disease Paternal Grandfather   . GER disease Sister   . Other Neg Hx        no lung cancer, colon cancer, or prostate  . Colon cancer Neg Hx     ROS: no fevers or chills, productive cough, hemoptysis, dysphasia, odynophagia, melena, hematochezia, dysuria, hematuria, rash, seizure activity, orthopnea, PND, pedal edema, claudication.  Remaining systems are negative.  Physical Exam: Well-developed well-nourished in no acute distress.  Skin is warm and dry.  HEENT is normal.  Neck is supple.  Chest is clear to auscultation with normal expansion.  Cardiovascular exam is regular rate and rhythm.  Abdominal exam nontender or distended. No masses palpated. Extremities show no edema. neuro grossly intact  ECG-sinus rhythm at a rate of 68, no ST changes.  Personally reviewed  A/P  1 coronary artery disease-patient denies recurrent chest pain.  Continue aspirin and statin.  2 hypertension-blood pressure controlled today.  Continue present medical regimen.  Laboratories October 22, 2018 personally reviewed.  Potassium 3.8, creatinine 0.84.  3 hyperlipidemia-continue statin.  Most recent laboratories personally reviewed.  Total cholesterol October 22, 1998 2105 with LDL 37.  Liver functions normal.  4 carotid artery disease-mild on most recent Dopplers.  Continue medical therapy.  Kirk Ruths, MD

## 2019-03-24 ENCOUNTER — Encounter: Payer: Self-pay | Admitting: Cardiology

## 2019-03-24 ENCOUNTER — Other Ambulatory Visit: Payer: Self-pay

## 2019-03-24 ENCOUNTER — Ambulatory Visit (INDEPENDENT_AMBULATORY_CARE_PROVIDER_SITE_OTHER): Payer: Medicare Other | Admitting: Cardiology

## 2019-03-24 VITALS — BP 134/62 | HR 68 | Ht 65.0 in | Wt 182.8 lb

## 2019-03-24 DIAGNOSIS — E7800 Pure hypercholesterolemia, unspecified: Secondary | ICD-10-CM

## 2019-03-24 DIAGNOSIS — I679 Cerebrovascular disease, unspecified: Secondary | ICD-10-CM

## 2019-03-24 DIAGNOSIS — I2510 Atherosclerotic heart disease of native coronary artery without angina pectoris: Secondary | ICD-10-CM | POA: Diagnosis not present

## 2019-03-24 DIAGNOSIS — I10 Essential (primary) hypertension: Secondary | ICD-10-CM

## 2019-03-24 NOTE — Patient Instructions (Signed)

## 2019-04-11 ENCOUNTER — Ambulatory Visit: Payer: Medicare Other

## 2019-04-14 DIAGNOSIS — L570 Actinic keratosis: Secondary | ICD-10-CM | POA: Diagnosis not present

## 2019-04-18 ENCOUNTER — Other Ambulatory Visit: Payer: Self-pay

## 2019-04-18 ENCOUNTER — Ambulatory Visit: Payer: Medicare Other | Attending: Internal Medicine

## 2019-04-18 DIAGNOSIS — Z23 Encounter for immunization: Secondary | ICD-10-CM | POA: Insufficient documentation

## 2019-04-18 NOTE — Progress Notes (Signed)
   Covid-19 Vaccination Clinic  Name:  SEVEN EARWOOD    MRN: [id number] DOB: Oct 20, 1945  04/18/2019  Mr. Minerva was observed post Covid-19 immunization for 15 minutes without incidence. He was provided with Vaccine Information Sheet and instruction to access the V-Safe system.   Mr. Rheinheimer was instructed to call 911 with any severe reactions post vaccine: Marland Kitchen Difficulty breathing  . Swelling of your face and throat  . A fast heartbeat  . A bad rash all over your body  . Dizziness and weakness    Immunizations Administered    Name Date Dose VIS Date Route   Pfizer COVID-19 Vaccine 04/18/2019  8:41 AM 0.3 mL 02/19/2019 Intramuscular   Manufacturer: [place]   Lot: [id number]   [place]: [id number]

## 2019-04-20 ENCOUNTER — Ambulatory Visit (INDEPENDENT_AMBULATORY_CARE_PROVIDER_SITE_OTHER): Payer: Medicare Other | Admitting: Family Medicine

## 2019-04-20 ENCOUNTER — Other Ambulatory Visit: Payer: Self-pay | Admitting: Family Medicine

## 2019-04-20 ENCOUNTER — Other Ambulatory Visit: Payer: Self-pay

## 2019-04-20 ENCOUNTER — Encounter: Payer: Self-pay | Admitting: Family Medicine

## 2019-04-20 DIAGNOSIS — E782 Mixed hyperlipidemia: Secondary | ICD-10-CM | POA: Diagnosis not present

## 2019-04-20 DIAGNOSIS — I2510 Atherosclerotic heart disease of native coronary artery without angina pectoris: Secondary | ICD-10-CM | POA: Diagnosis not present

## 2019-04-20 DIAGNOSIS — N200 Calculus of kidney: Secondary | ICD-10-CM

## 2019-04-20 DIAGNOSIS — I10 Essential (primary) hypertension: Secondary | ICD-10-CM

## 2019-04-20 DIAGNOSIS — R739 Hyperglycemia, unspecified: Secondary | ICD-10-CM

## 2019-04-20 LAB — COMPREHENSIVE METABOLIC PANEL
ALT: 23 U/L (ref 0–53)
AST: 17 U/L (ref 0–37)
Albumin: 4.2 g/dL (ref 3.5–5.2)
Alkaline Phosphatase: 75 U/L (ref 39–117)
BUN: 12 mg/dL (ref 6–23)
CO2: 29 mEq/L (ref 19–32)
Calcium: 9.2 mg/dL (ref 8.4–10.5)
Chloride: 104 mEq/L (ref 96–112)
Creatinine, Ser: 0.82 mg/dL (ref 0.40–1.50)
GFR: 91.89 mL/min (ref >60.00)
Glucose, Bld: 110 mg/dL — ABNORMAL HIGH (ref 70–99)
Potassium: 3.8 mEq/L (ref 3.5–5.1)
Sodium: 138 mEq/L (ref 135–145)
Total Bilirubin: 0.6 mg/dL (ref 0.2–1.2)
Total Protein: 7 g/dL (ref 6.0–8.3)

## 2019-04-20 LAB — CBC
HCT: 41.6 % (ref 39.0–52.0)
Hemoglobin: 14.1 g/dL (ref 13.0–17.0)
MCHC: 33.8 g/dL (ref 30.0–36.0)
MCV: 94.1 fl (ref 78.0–100.0)
Platelets: 226 10*3/uL (ref 150.0–400.0)
RBC: 4.42 Mil/uL (ref 4.22–5.81)
RDW: 13 % (ref 11.5–15.5)
WBC: 7.3 10*3/uL (ref 4.0–10.5)

## 2019-04-20 LAB — LIPID PANEL
Cholesterol: 92 mg/dL (ref 0–200)
HDL: 33.3 mg/dL — ABNORMAL LOW (ref >39.00)
NonHDL: 58.84
Total CHOL/HDL Ratio: 3
Triglycerides: 204 mg/dL — ABNORMAL HIGH (ref 0.0–149.0)
VLDL: 40.8 mg/dL — ABNORMAL HIGH (ref 0.0–40.0)

## 2019-04-20 LAB — TSH: TSH: 2.88 u[IU]/mL (ref 0.35–4.50)

## 2019-04-20 LAB — HEMOGLOBIN A1C: Hgb A1c MFr Bld: 6.4 % (ref 4.6–6.5)

## 2019-04-20 LAB — LDL CHOLESTEROL, DIRECT: Direct LDL: 41 mg/dL

## 2019-04-20 MED ORDER — AMLODIPINE BESYLATE 10 MG PO TABS: 10.0000 mg | ORAL_TABLET | Freq: Every morning | ORAL | 3 refills | Status: DC

## 2019-04-20 MED ORDER — COLESEVELAM HCL 3.75 G PO PACK: PACK | ORAL | 3 refills | Status: DC

## 2019-04-20 MED ORDER — LOSARTAN POTASSIUM 100 MG PO TABS: 100.0000 mg | ORAL_TABLET | Freq: Every morning | ORAL | 3 refills | Status: DC

## 2019-04-20 NOTE — Assessment & Plan Note (Signed)
Has recently had his annual visit with his cardiologist Dr Stanford Breed and no changes were made or new concerns identified.

## 2019-04-20 NOTE — Progress Notes (Signed)
Subjective:    Patient ID: Anthony [name], male    DOB: January 26, 1946, 74 y.o.   MRN: [id number]  Chief Complaint  Patient presents with  . Follow-up    HPI Patient is in today for follow up on chronic medical concerns. No recent febrile illness or hospitalizations. He has recently been seen by his cardiologist and his urologist and no new concerns were identified. He is trying to eat well and stay active while he maintains quarantine. Denies CP/palp/SOB/HA/congestion/fevers/GI or GU c/o. Taking meds as prescribed  Past Medical History:  Diagnosis Date  . Allergic rhinitis   . BPH (benign prostatic hyperplasia)   . CAD (coronary artery disease) 05/26/2009 :  primary cardiolgoist-  dr Stanford Breed   hx NSTEMI cardiac catherization  revealed-left main normal; LAD proximal calcification,  long proximal 30% stenosis,  Circumflex  AV groove   proximal  60% stenosis at mid obtuse marginal,  first large mid obtuse marginal  ostial 70% stenosis,  prox. to mid RCA  99% stenosis w/ intervention PCI and DES  . Carotid stenosis, asymptomatic, bilateral    per last duplex 03-31-2015  bilateral ICA 1-39%  . External hemorrhoids    large external  . GERD (gastroesophageal reflux disease)   . History of adenomatous polyp of colon   . History of kidney stones   . History of non-ST elevation myocardial infarction (NSTEMI) 05/26/2009  . Hyperlipidemia   . Hypertension    8-10 years  . Left ureteral stone   . Macular degeneration   . Nephrolithiasis    right side non-obstructive per CT 01-03-2017  . S/P drug eluting coronary stent placement 05/26/2009   DESx1  to pRCA  . Wears glasses     Past Surgical History:  Procedure Laterality Date  . CARDIOVASCULAR STRESS TEST  12/26/2011   dr Stanford Breed   normal nuclear study w/ no ischemia/  normal LV function and wall motion , ef 71%  . CATARACT EXTRACTION W/ INTRAOCULAR LENS  IMPLANT, BILATERAL  2010  . COLONOSCOPY  last one 06-10-2016  . CORONARY  ANGIOPLASTY WITH STENT PLACEMENT  05-26-2009  dr hochrein/ dr Darnell Level brodie   Severe RCA stenosis and moderate stenosis in the left system, ef 50% with inferior hypokinsis:  PCI and DES x1 to RCA (Prom[facility])  . CYSTOSCOPY W/ RETROGRADES Bilateral 09/29/2013   Procedure: CYSTOSCOPY WITH RETROGRADE PYELOGRAM;  Surgeon: Alexis Frock, MD;  Location: [facility];  Service: Urology;  Laterality: Bilateral;  . CYSTOSCOPY WITH RETROGRADE PYELOGRAM, URETEROSCOPY AND STENT PLACEMENT N/A 06/14/2015   Procedure: CYSTOSCOPY WITH RETROGRADE PYELOGRAM, cysto litholapexy ;  Surgeon: Alexis Frock, MD;  Location: [facility];  Service: Urology;  Laterality: N/A;  . CYSTOSCOPY/URETEROSCOPY/HOLMIUM LASER/STENT PLACEMENT Left 01/07/2017   Procedure: CYSTOSCOPY/RETROGRADE/URETEROSCOPY/HOLMIUM LASER/STENT PLACEMENT, FULGERATION OF PROSTATIC URETHRA;  Surgeon: Ceasar Mons, MD;  Location: [facility];  Service: Urology;  Laterality: Left;  . HOLMIUM LASER APPLICATION N/A [id number]   Procedure: HOLMIUM LASER APPLICATION bladder stone;  Surgeon: Alexis Frock, MD;  Location: [facility];  Service: Urology;  Laterality: N/A;  . HOLMIUM LASER APPLICATION Left [id number]   Procedure: HOLMIUM LASER APPLICATION;  Surgeon: Ceasar Mons, MD;  Location: [facility];  Service: Urology;  Laterality: Left;  . NEPHROLITHOTOMY Left 09/29/2013   Procedure: NEPHROLITHOTOMY PERCUTANEOUS WITH ACCESS;  Surgeon: Alexis Frock, MD;  Location: [facility];  Service: Urology;  Laterality: Left;  . URETEROSCOPY N/A 09/29/2013   Procedure: URETEROSCOPY;  Surgeon: Alexis Frock,  MD;  Location: [facility];  Service: Urology;  Laterality: N/A;    Family History  Problem Relation Age of Onset  . Coronary artery disease Father        in his 3's  . Hyperlipidemia Father   . Hypertension Father   . Hearing loss Father   . Heart disease Mother        deceased of heart disease    . Hypertension Mother   . Hyperlipidemia Mother   . Multiple sclerosis Son   . Heart disease Maternal Grandmother   . Heart disease Maternal Grandfather   . Heart disease Paternal Grandmother   . Heart disease Paternal Grandfather   . GER disease Sister   . Other Neg Hx        no lung cancer, colon cancer, or prostate  . Colon cancer Neg Hx     Social History   Socioeconomic History  . Marital status: Married    Spouse name: Not on file  . Number of children: Not on file  . Years of education: Not on file  . Highest education level: Not on file  Occupational History  . Not on file  Tobacco Use  . Smoking status: Former Smoker    Years: 45.00    Types: Cigarettes    Quit date: 03/12/2007    Years since quitting: 12.1  . Smokeless tobacco: Never Used  Substance and Sexual Activity  . Alcohol use: No  . Drug use: No  . 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 Not on file  Social History Narrative   married -32 yrs  Anthony [name])   grew up in [place]   He has one son - two grandaugthers     He is retired.  He had     a 40 pack-year smoking history but has discontinued.     prev occuptation -  [employer]   Alcohol use-no   Smoking Status:  quit   Packs/Day:  0.5   Caffeine use/day:  2 beverages daily   Does Patient Exercise:  yes            Social Determinants of Health   Financial Resource Strain:   . Difficulty of Paying Living Expenses: Not on file  Food Insecurity:   . Worried About Charity fundraiser in the Last Year: Not on file  . Ran Out of Food in the Last Year: Not on file  Transportation Needs:   . Lack of Transportation (Medical): Not on file  . Lack of Transportation (Non-Medical): Not on file  Physical Activity:   . Days of Exercise per Week: Not on file  . Minutes of Exercise per Session: Not on file  Stress:   . Feeling of Stress : Not on file   Social Connections:   . Frequency of Communication with Friends and Family: Not on file  . Frequency of Social Gatherings with Friends and Family: Not on file  . Attends Religious Services: Not on file  . Active Member of Clubs or Organizations: Not on file  . Attends Archivist Meetings: Not on file  . Marital Status: Not on file  Intimate Partner Violence:   . Fear of Current or Ex-Partner: Not on file  . Emotionally Abused: Not on file  . Physically Abused: Not on file  . Sexually Abused: Not on file    Outpatient Medications  Prior to Visit  Medication Sig Dispense Refill  . ascorbic acid (VITAMIN C) 500 MG tablet Take 500 mg by mouth daily.    Marland Kitchen aspirin 81 MG tablet Take 81 mg by mouth every morning.     . metoprolol tartrate (LOPRESSOR) 25 MG tablet TAKE ONE-HALF TABLET BY  MOUTH TWO TIMES DAILY 90 tablet 3  . Multiple Vitamins-Minerals (ICAPS PO) Take by mouth every morning.     . potassium citrate (UROCIT-K) 10 MEQ (1080 MG) SR tablet takes one tablet twice daily  3  . rosuvastatin (CRESTOR) 40 MG tablet TAKE 1 TABLET BY MOUTH  DAILY 90 tablet 3  . amLODipine (NORVASC) 10 MG tablet TAKE 1 TABLET BY MOUTH IN  THE MORNING 90 tablet 3  . losartan (COZAAR) 100 MG tablet TAKE 1 TABLET BY MOUTH IN  THE MORNING 90 tablet 3   No facility-administered medications prior to visit.    No Known Allergies  Review of Systems  Constitutional: Negative for fever and malaise/fatigue.  HENT: Negative for congestion.   Eyes: Negative for blurred vision.  Respiratory: Negative for shortness of breath.   Cardiovascular: Negative for chest pain, palpitations and leg swelling.  Gastrointestinal: Negative for abdominal pain, blood in stool and nausea.  Genitourinary: Negative for dysuria and frequency.  Musculoskeletal: Negative for falls.  Skin: Negative for rash.  Neurological: Negative for dizziness, loss of consciousness and headaches.  Endo/Heme/Allergies: Negative for  environmental allergies.  Psychiatric/Behavioral: Negative for depression. The patient is not nervous/anxious.        Objective:    Physical Exam Vitals and nursing note reviewed.  Constitutional:      General: He is not in acute distress.    Appearance: He is well-developed.  HENT:     Head: Normocephalic and atraumatic.     Nose: Nose normal.  Eyes:     General:        Right eye: No discharge.        Left eye: No discharge.  Cardiovascular:     Rate and Rhythm: Normal rate and regular rhythm.     Heart sounds: No murmur.  Pulmonary:     Effort: Pulmonary effort is normal.     Breath sounds: Normal breath sounds.  Abdominal:     General: Bowel sounds are normal.     Palpations: Abdomen is soft.     Tenderness: There is no abdominal tenderness.  Musculoskeletal:     Cervical back: Normal range of motion and neck supple.  Skin:    General: Skin is warm and dry.  Neurological:     Mental Status: He is alert and oriented to person, place, and time.     BP 120/72 (BP Location: Left Arm, Patient Position: Sitting, Cuff Size: Normal)   Pulse 74   Temp (!) 97.5 F (36.4 C) (Temporal)   Ht 5\' 6"  (1.676 m)   Wt 181 lb (82.1 kg)   SpO2 96%   BMI 29.21 kg/m  Wt Readings from Last 3 Encounters:  04/20/19 181 lb (82.1 kg)  03/24/19 182 lb 12.8 oz (82.9 kg)  01/20/19 168 lb (76.2 kg)    Diabetic Foot Exam - Simple   No data filed     Lab Results  Component Value Date   WBC 7.3 04/20/2019   HGB 14.1 04/20/2019   HCT 41.6 04/20/2019   PLT 226.0 04/20/2019   GLUCOSE 110 (H) 04/20/2019   CHOL 92 04/20/2019   TRIG 204.0 (H) 04/20/2019   HDL  33.30 (L) 04/20/2019   LDLDIRECT 41.0 04/20/2019   LDLCALC 37 10/22/2018   ALT 23 04/20/2019   AST 17 04/20/2019   NA 138 04/20/2019   K 3.8 04/20/2019   CL 104 04/20/2019   CREATININE 0.82 04/20/2019   BUN 12 04/20/2019   CO2 29 04/20/2019   TSH 2.88 04/20/2019   PSA 3.32 03/24/2013   INR 1.05 05/25/2009   HGBA1C 6.4  04/20/2019    Lab Results  Component Value Date   TSH 2.88 04/20/2019   Lab Results  Component Value Date   WBC 7.3 04/20/2019   HGB 14.1 04/20/2019   HCT 41.6 04/20/2019   MCV 94.1 04/20/2019   PLT 226.0 04/20/2019   Lab Results  Component Value Date   NA 138 04/20/2019   K 3.8 04/20/2019   CO2 29 04/20/2019   GLUCOSE 110 (H) 04/20/2019   BUN 12 04/20/2019   CREATININE 0.82 04/20/2019   BILITOT 0.6 04/20/2019   ALKPHOS 75 04/20/2019   AST 17 04/20/2019   ALT 23 04/20/2019   PROT 7.0 04/20/2019   ALBUMIN 4.2 04/20/2019   CALCIUM 9.2 04/20/2019   ANIONGAP 12 09/30/2013   GFR 91.89 04/20/2019   Lab Results  Component Value Date   CHOL 92 04/20/2019   Lab Results  Component Value Date   HDL 33.30 (L) 04/20/2019   Lab Results  Component Value Date   LDLCALC 37 10/22/2018   Lab Results  Component Value Date   TRIG 204.0 (H) 04/20/2019   Lab Results  Component Value Date   CHOLHDL 3 04/20/2019   Lab Results  Component Value Date   HGBA1C 6.4 04/20/2019       Assessment & Plan:   Problem List Items Addressed This Visit    Hyperlipidemia, mixed    Tolerating statin, encouraged heart healthy diet, avoid trans fats, minimize simple carbs and saturated fats. Increase exercise as tolerated      Relevant Medications   amLODipine (NORVASC) 10 MG tablet   losartan (COZAAR) 100 MG tablet   Other Relevant Orders   Lipid panel (Completed)   Hypertension    Well controlled, no changes to meds. Encouraged heart healthy diet such as the DASH diet and exercise as tolerated.       Relevant Medications   amLODipine (NORVASC) 10 MG tablet   losartan (COZAAR) 100 MG tablet   Other Relevant Orders   CBC (Completed)   Comprehensive metabolic panel (Completed)   TSH (Completed)   CAD, NATIVE VESSEL    Has recently had his annual visit with his cardiologist Dr Stanford Breed and no changes were made or new concerns identified.       Relevant Medications   amLODipine  (NORVASC) 10 MG tablet   losartan (COZAAR) 100 MG tablet   Hyperglycemia    hgba1c acceptable, minimize simple carbs. Increase exercise as tolerated.       Relevant Orders   Hemoglobin A1c (Completed)   Staghorn calculus    Continues to follow annually with Dr Gilford Rile of Urology and he has an asymptomatic stone they are monitoring. He is hydrating well         I have changed Bishop Limbo "Bobby"'s amLODipine and losartan. I am also having him maintain his aspirin, Multiple Vitamins-Minerals (ICAPS PO), potassium citrate, metoprolol tartrate, rosuvastatin, and ascorbic acid.  Meds ordered this encounter  Medications  . amLODipine (NORVASC) 10 MG tablet    Sig: Take 1 tablet (10 mg total) by mouth every morning.  Dispense:  90 tablet    Refill:  3    Requesting 1 year supply  . losartan (COZAAR) 100 MG tablet    Sig: Take 1 tablet (100 mg total) by mouth every morning.    Dispense:  90 tablet    Refill:  3    Requesting 1 year supply     Penni Homans, MD

## 2019-04-20 NOTE — Patient Instructions (Addendum)
Tdap for tetanus 3 months after last COVID shot if any chance of a newborn being in your life in the next few years Otherwise you can get the Td shot  Omron Blood Pressure cuff, upper arm, want BP 100-140/60-90 Pulse oximeter, want oxygen in 90s  Weekly vitals  Take Multivitamin with minerals, selenium Vitamin D 1000-2000 IU daily Probiotic with lactobacillus and bifidophilus Asprin EC 81 mg daily  Melatonin 2-5 mg at bedtime  [email or web address]

## 2019-04-20 NOTE — Assessment & Plan Note (Signed)
Well controlled, no changes to meds. Encouraged heart healthy diet such as the DASH diet and exercise as tolerated.  °

## 2019-04-20 NOTE — Assessment & Plan Note (Signed)
Tolerating statin, encouraged heart healthy diet, avoid trans fats, minimize simple carbs and saturated fats. Increase exercise as tolerated 

## 2019-04-20 NOTE — Assessment & Plan Note (Signed)
Continues to follow annually with Dr Gilford Rile of Urology and he has an asymptomatic stone they are monitoring. He is hydrating well

## 2019-04-20 NOTE — Assessment & Plan Note (Signed)
hgba1c acceptable, minimize simple carbs. Increase exercise as tolerated.  

## 2019-04-22 NOTE — Progress Notes (Signed)
Virtual Visit via Audio Note  I connected with patient on 04/23/19 at  9:00 AM EST by audio enabled telemedicine application and verified that I am speaking with the correct person using two identifiers.   THIS ENCOUNTER IS A VIRTUAL VISIT DUE TO COVID-19 - PATIENT WAS NOT SEEN IN THE OFFICE. PATIENT HAS CONSENTED TO VIRTUAL VISIT / TELEMEDICINE VISIT   Location of patient: home  Location of provider: office  I discussed the limitations of evaluation and management by telemedicine and the availability of in person appointments. The patient expressed understanding and agreed to proceed.   Subjective:   ARMSTEAD ARGUELLO is a 74 y.o. male who presents for Medicare Annual/Subsequent preventive examination.  Enjoys woodworking.  Review of Systems:  Home Safety/Smoke Alarms: Feels safe in home. Smoke alarms in place.  Lives w/ wife in 1 story home. No pets. Step over tub w/ grab rails.   Male:   CCS-  06/10/16. Recall 3 years    PSA-  Lab Results  Component Value Date   PSA 3.32 03/24/2013   PSA 2.46 02/12/2012   PSA 1.61 02/13/2011       Objective:    Vitals: BP (!) 144/75 Comment: pt reported vitals.  Pulse 71   Temp (!) 97.1 F (36.2 C)   SpO2 98%   There is no height or weight on file to calculate BMI.  Advanced Directives 04/23/2019 04/20/2018 04/18/2017 01/07/2017 01/03/2017 06/10/2016 04/16/2016  Does Patient Have a Medical Advance Directive? Yes Yes Yes Yes No Yes Yes  Type of Advance Directive Living will;Healthcare Power of [place];Living will [place];Living will [place];Living will - - [place];Living will  Does patient want to make changes to medical advance directive? No - Patient declined No - Patient declined No - Patient declined No - Patient declined - - No - Patient declined  Copy of [place] in Chart? Yes - validated most recent copy scanned in chart (See  row information) Yes - validated most recent copy scanned in chart (See row information) Yes No - copy requested - - No - copy requested  Pre-existing out of facility DNR order (yellow form or pink MOST form) - - - - - - -    Tobacco Social History   Tobacco Use  Smoking Status Former Smoker  . Years: 45.00  . Types: Cigarettes  . Quit date: 03/12/2007  . Years since quitting: 12.1  Smokeless Tobacco Never Used     Counseling given: Not Answered   Clinical Intake: Pain : No/denies pain      Past Medical History:  Diagnosis Date  . Allergic rhinitis   . BPH (benign prostatic hyperplasia)   . CAD (coronary artery disease) 05/26/2009 :  primary cardiolgoist-  dr Stanford Breed   hx NSTEMI cardiac catherization  revealed-left main normal; LAD proximal calcification,  long proximal 30% stenosis,  Circumflex  AV groove   proximal  60% stenosis at mid obtuse marginal,  first large mid obtuse marginal  ostial 70% stenosis,  prox. to mid RCA  99% stenosis w/ intervention PCI and DES  . Carotid stenosis, asymptomatic, bilateral    per last duplex 03-31-2015  bilateral ICA 1-39%  . External hemorrhoids    large external  . GERD (gastroesophageal reflux disease)   . History of adenomatous polyp of colon   . History of kidney stones   . History of non-ST elevation myocardial infarction (NSTEMI) 05/26/2009  .  Hyperlipidemia   . Hypertension    8-10 years  . Left ureteral stone   . Macular degeneration   . Nephrolithiasis    right side non-obstructive per CT 01-03-2017  . S/P drug eluting coronary stent placement 05/26/2009   DESx1  to pRCA  . Wears glasses    Past Surgical History:  Procedure Laterality Date  . CARDIOVASCULAR STRESS TEST  12/26/2011   dr Stanford Breed   normal nuclear study w/ no ischemia/  normal LV function and wall motion , ef 71%  . CATARACT EXTRACTION W/ INTRAOCULAR LENS  IMPLANT, BILATERAL  2010  . COLONOSCOPY  last one 06-10-2016  . CORONARY ANGIOPLASTY WITH STENT  PLACEMENT  05-26-2009  dr hochrein/ dr Darnell Level brodie   Severe RCA stenosis and moderate stenosis in the left system, ef 50% with inferior hypokinsis:  PCI and DES x1 to RCA (Promus)  . CYSTOSCOPY W/ RETROGRADES Bilateral 09/29/2013   Procedure: CYSTOSCOPY WITH RETROGRADE PYELOGRAM;  Surgeon: Alexis Frock, MD;  Location: [facility];  Service: Urology;  Laterality: Bilateral;  . CYSTOSCOPY WITH RETROGRADE PYELOGRAM, URETEROSCOPY AND STENT PLACEMENT N/A 06/14/2015   Procedure: CYSTOSCOPY WITH RETROGRADE PYELOGRAM, cysto litholapexy ;  Surgeon: Alexis Frock, MD;  [facility];  Service: Urology;  Laterality: N/A;  . CYSTOSCOPY/URETEROSCOPY/HOLMIUM LASER/STENT PLACEMENT Left 01/07/2017   Procedure: CYSTOSCOPY/RETROGRADE/URETEROSCOPY/HOLMIUM LASER/STENT PLACEMENT, FULGERATION OF PROSTATIC URETHRA;  Surgeon: Ceasar Mons, MD;  Location[facility];  Service: Urology;  Laterality: Left;  . HOLMIUM LASER APPLICATION N/A [id number]   Procedure: HOLMIUM LASER APPLICATION bladder stone;  Surgeon: Alexis Frock, MD;  Location: [facility];  Service: Urology;  Laterality: N/A;  . HOLMIUM LASER APPLICATION Left [id number]   Procedure: HOLMIUM LASER APPLICATION;  Surgeon: Ceasar Mons, MD;  Location: [facility];  Service: Urology;  Laterality: Left;  . NEPHROLITHOTOMY Left 09/29/2013   Procedure: NEPHROLITHOTOMY PERCUTANEOUS WITH ACCESS;  Surgeon: Alexis Frock, MD;  Location: [facility];  Service: Urology;  Laterality: Left;  . URETEROSCOPY N/A 09/29/2013   Procedure: URETEROSCOPY;  Surgeon: Alexis Frock, MD;  Location: [facility];  Service: Urology;  Laterality: N/A;   Family History  Problem Relation Age of Onset  . Coronary artery disease Father        in his 4's  . Hyperlipidemia Father   . Hypertension Father   . Hearing loss Father   . Heart disease Mother        deceased of heart disease  . Hypertension Mother    . Hyperlipidemia Mother   . Multiple sclerosis Son   . Heart disease Maternal Grandmother   . Heart disease Maternal Grandfather   . Heart disease Paternal Grandmother   . Heart disease Paternal Grandfather   . GER disease Sister   . Other Neg Hx        no lung cancer, colon cancer, or prostate  . Colon cancer Neg Hx    Social History   Socioeconomic History  . Marital status: Married    Spouse name: Not on file  . Number of children: Not on file  . Years of education: Not on file  . Highest education level: Not on file  Occupational History  . Not on file  Tobacco Use  . Smoking status: Former Smoker    Years: 45.00    Types: Cigarettes    Quit date: 03/12/2007    Years since quitting: 12.1  . Smokeless tobacco: Never Used  Substance and Sexual Activity  .  Alcohol use: No  . Drug use: No  . 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 Not on file  Social History Narrative   married -54 yrs  [name] [place])   grew up in [place]   He has one son - two grandaugthers     He is retired.  He had     a 40 pack-year smoking history but has discontinued.     prev occuptation -  [employer]   Alcohol use-no   Smoking Status:  quit   Packs/Day:  0.5   Caffeine use/day:  2 beverages daily   Does Patient Exercise:  yes            Social Determinants of Health   Financial Resource Strain: Low Risk   . Difficulty of Paying Living Expenses: Not hard at all  Food Insecurity:   . Worried About Charity fundraiser in the Last Year: Not on file  . Ran Out of Food in the Last Year: Not on file  Transportation Needs: No Transportation Needs  . Lack of Transportation (Medical): No  . Lack of Transportation (Non-Medical): No  Physical Activity:   . Days of Exercise per Week: Not on file  . Minutes of Exercise per Session: Not on file  Stress:   . Feeling of Stress : Not on file  Social  Connections:   . Frequency of Communication with Friends and Family: Not on file  . Frequency of Social Gatherings with Friends and Family: Not on file  . Attends Religious Services: Not on file  . Active Member of Clubs or Organizations: Not on file  . Attends Archivist Meetings: Not on file  . Marital Status: Not on file    Outpatient Encounter Medications as of 04/23/2019  Medication Sig  . amLODipine (NORVASC) 10 MG tablet Take 1 tablet (10 mg total) by mouth every morning.  Marland Kitchen ascorbic acid (VITAMIN C) 500 MG tablet Take 500 mg by mouth daily.  Marland Kitchen aspirin 81 MG tablet Take 81 mg by mouth every morning.   . Colesevelam HCl (WELCHOL) 3.75 g PACK Mix 1 packet with 6 oz of liquid daily  . losartan (COZAAR) 100 MG tablet Take 1 tablet (100 mg total) by mouth every morning.  . metoprolol tartrate (LOPRESSOR) 25 MG tablet TAKE ONE-HALF TABLET BY  MOUTH TWO TIMES DAILY  . Multiple Vitamins-Minerals (ICAPS PO) Take by mouth every morning.   . potassium citrate (UROCIT-K) 10 MEQ (1080 MG) SR tablet takes one tablet twice daily  . rosuvastatin (CRESTOR) 40 MG tablet TAKE 1 TABLET BY MOUTH  DAILY   No facility-administered encounter medications on file as of 04/23/2019.    Activities of Daily Living In your present state of health, do you have any difficulty performing the following activities: 04/23/2019  Hearing? N  Vision? N  Difficulty concentrating or making decisions? N  Walking or climbing stairs? N  Dressing or bathing? N  Doing errands, shopping? N  Preparing Food and eating ? N  Using the Toilet? N  In the past six months, have you accidently leaked urine? N  Do you have problems with loss of bowel control? N  Managing your Medications? N  Managing your Finances? N  Housekeeping or managing your Housekeeping? N  Some recent data might be hidden    Patient Care Team: [name], MD as PCP - General (  Family Medicine) [place], Jennefer Bravo, MD as Referring  Physician (Dermatology) Alexis Frock, MD as Consulting Physician (Urology) Gerarda Fraction, MD as Referring Physician (Ophthalmology) Lelon Perla, MD as Consulting Physician (Cardiology) Ceasar Mons, MD as Consulting Physician (Urology)   Assessment:   This is a routine wellness examination for [place]. Physical assessment deferred to PCP.  Exercise Activities and Dietary recommendations Current Exercise Habits: Home exercise routine, Time (Minutes): 30, Frequency (Times/Week): 7, Weekly Exercise (Minutes/Week): 210, Intensity: Mild, Exercise limited by: None identified   Diet (meal preparation, eat out, water intake, caffeinated beverages, dairy products, fruits and vegetables): in general, a "healthy" diet  , well balanced     Goals    . Maintain current active lifestyle.  (pt-stated)       Fall Risk Fall Risk  04/23/2019 04/20/2018 04/18/2017 04/16/2016 04/14/2015  Falls in the past year? 0 0 No No No  Number falls in past yr: 0 - - - -  Injury with Fall? 0 - - - -  Follow up Education provided;Falls prevention discussed - - - -   Depression Screen  PHQ 2/9 Scores 04/23/2019 10/22/2018 04/20/2018 04/18/2017  PHQ - 2 Score 0 0 0 0    Cognitive Function Ad8 score reviewed for issues:  Issues making decisions:no  Less interest in hobbies / activities:no  Repeats questions, stories (family complaining):no  Trouble using ordinary gadgets (microwave, computer, phone):no  Forgets the month or year: no  Mismanaging finances: no  Remembering appts:no  Daily problems with thinking and/or memory:no Ad8 score is=0     MMSE - Mini Mental State Exam 04/20/2018 04/18/2017  Orientation to time 5 5  Orientation to Place 5 5  Registration 3 3  Attention/ Calculation 5 5  Recall 3 3  Language- name 2 objects 2 2  Language- repeat 1 1  Language- follow 3 step command 3 3  Language- read & follow direction 1 1  Write a sentence 1 1  Copy design 1 1  Total score 30  30        Immunization History  Administered Date(s) Administered  . Fluad Quad(high Dose 65+) 11/06/2018  . Influenza Split 12/07/2010, 11/20/2011, 12/05/2011  . Influenza Whole 01/06/2009, 11/20/2009  . Influenza, High Dose Seasonal PF 12/06/2015, 12/17/2016, 12/01/2017  . Influenza,inj,Quad PF,6+ Mos 12/09/2012, 12/15/2013, 11/30/2014  . PFIZER SARS-COV-2 Vaccination 04/18/2019  . Pneumococcal Conjugate-13 04/14/2015  . Pneumococcal Polysaccharide-23 05/11/2009, 12/17/2016  . Td 10/27/2007  . Zoster 05/30/2008  . Zoster Recombinat (Shingrix) 12/24/2017, 03/09/2018    Screening Tests Health Maintenance  Topic Date Due  . TETANUS/TDAP  10/26/2017  . COLONOSCOPY  06/11/2019  . INFLUENZA VACCINE  Completed  . Hepatitis C Screening  Completed  . PNA vac Low Risk Adult  Completed     Plan:    Please schedule your next medicare wellness visit with me in 1 yr.  Continue to eat heart healthy diet (full of fruits, vegetables, whole grains, lean protein, water--limit salt, fat, and sugar intake) and increase physical activity as tolerated.  Continue doing brain stimulating activities (puzzles, reading, adult coloring books, staying active) to keep memory sharp.   Keep up the great work!  I have personally reviewed and noted the following in the patient's chart:   . Medical and social history . Use of alcohol, tobacco or illicit drugs  . Current medications and supplements . Functional ability and status . Nutritional status . Physical activity . Advanced directives . List of other physicians . Hospitalizations,  surgeries, and ER visits in previous 12 months . Vitals . Screenings to include cognitive, depression, and falls . Referrals and appointments  In addition, I have reviewed and discussed with patient certain preventive protocols, quality metrics, and best practice recommendations. A written personalized care plan for preventive services as well as general preventive  health recommendations were provided to patient.     Naaman Plummer [place], [place]  04/23/2019

## 2019-04-23 ENCOUNTER — Other Ambulatory Visit: Payer: Self-pay

## 2019-04-23 ENCOUNTER — Encounter: Payer: Self-pay | Admitting: *Deleted

## 2019-04-23 ENCOUNTER — Ambulatory Visit: Payer: Medicare Other | Admitting: Family Medicine

## 2019-04-23 ENCOUNTER — Ambulatory Visit (INDEPENDENT_AMBULATORY_CARE_PROVIDER_SITE_OTHER): Payer: Medicare Other | Admitting: *Deleted

## 2019-04-23 VITALS — BP 144/75 | HR 71 | Temp 97.1°F

## 2019-04-23 DIAGNOSIS — Z0000 Encounter for general adult medical examination without abnormal findings: Secondary | ICD-10-CM | POA: Diagnosis not present

## 2019-04-23 NOTE — Patient Instructions (Signed)
Please schedule your next medicare wellness visit with me in 1 yr.  Continue to eat heart healthy diet (full of fruits, vegetables, whole grains, lean protein, water--limit salt, fat, and sugar intake) and increase physical activity as tolerated.  Continue doing brain stimulating activities (puzzles, reading, adult coloring books, staying active) to keep memory sharp.   Keep up the great work!   [name] , Thank you for taking time to come for your Medicare Wellness Visit. I appreciate your ongoing commitment to your health goals. Please review the following plan we discussed and let me know if I can assist you in the future.   These are the goals we discussed: Goals    . Maintain current active lifestyle.  (pt-stated)       This is a list of the screening recommended for you and due dates:  Health Maintenance  Topic Date Due  . Tetanus Vaccine  10/26/2017  . Colon Cancer Screening  06/11/2019  . Flu Shot  Completed  .  Hepatitis C: One time screening is recommended by Center for Disease Control  (CDC) for  adults born from [birth year] through 1965.   Completed  . Pneumonia vaccines  Completed    Preventive Care 25 Years and Older, Male Preventive care refers to lifestyle choices and visits with your health care provider that can promote health and wellness. This includes:  A yearly physical exam. This is also called an annual well check.  Regular dental and eye exams.  Immunizations.  Screening for certain conditions.  Healthy lifestyle choices, such as diet and exercise. What can I expect for my preventive care visit? Physical exam Your health care provider will check:  Height and weight. These may be used to calculate body mass index (BMI), which is a measurement that tells if you are at a healthy weight.  Heart rate and blood pressure.  Your skin for abnormal spots. Counseling Your health care provider may ask you questions about:  Alcohol, tobacco, and drug  use.  Emotional well-being.  Home and relationship well-being.  Sexual activity.  Eating habits.  History of falls.  Memory and ability to understand (cognition).  Work and work Statistician. What immunizations do I need?  Influenza (flu) vaccine  This is recommended every year. Tetanus, diphtheria, and pertussis (Tdap) vaccine  You may need a Td booster every 10 years. Varicella (chickenpox) vaccine  You may need this vaccine if you have not already been vaccinated. Zoster (shingles) vaccine  You may need this after age 77. Pneumococcal conjugate (PCV13) vaccine  One dose is recommended after age 58. Pneumococcal polysaccharide (PPSV23) vaccine  One dose is recommended after age 56. Measles, mumps, and rubella (MMR) vaccine  You may need at least one dose of MMR if you were born in 1957 or later. You may also need a second dose. Meningococcal conjugate (MenACWY) vaccine  You may need this if you have certain conditions. Hepatitis A vaccine  You may need this if you have certain conditions or if you travel or work in places where you may be exposed to hepatitis A. Hepatitis B vaccine  You may need this if you have certain conditions or if you travel or work in places where you may be exposed to hepatitis B. Haemophilus influenzae type b (Hib) vaccine  You may need this if you have certain conditions. You may receive vaccines as individual doses or as more than one vaccine together in one shot (combination vaccines). Talk with your health care  provider about the risks and benefits of combination vaccines. What tests do I need? Blood tests  Lipid and cholesterol levels. These may be checked every 5 years, or more frequently depending on your overall health.  Hepatitis C test.  Hepatitis B test. Screening  Lung cancer screening. You may have this screening every year starting at age 14 if you have a 30-pack-year history of smoking and currently smoke or have  quit within the past 15 years.  Colorectal cancer screening. All adults should have this screening starting at age 81 and continuing until age 12. Your health care provider may recommend screening at age 34 if you are at increased risk. You will have tests every 1-10 years, depending on your results and the type of screening test.  Prostate cancer screening. Recommendations will vary depending on your family history and other risks.  Diabetes screening. This is done by checking your blood sugar (glucose) after you have not eaten for a while (fasting). You may have this done every 1-3 years.  Abdominal aortic aneurysm (AAA) screening. You may need this if you are a current or former smoker.  Sexually transmitted disease (STD) testing. Follow these instructions at home: Eating and drinking  Eat a diet that includes fresh fruits and vegetables, whole grains, lean protein, and low-fat dairy products. Limit your intake of foods with high amounts of sugar, saturated fats, and salt.  Take vitamin and mineral supplements as recommended by your health care provider.  Do not drink alcohol if your health care provider tells you not to drink.  If you drink alcohol: ? Limit how much you have to 0-2 drinks a day. ? Be aware of how much alcohol is in your drink. In the [place] one drink equals one 12 oz bottle of beer (355 mL), one 5 oz glass of wine (148 mL), or one 1 oz glass of hard liquor (44 mL). Lifestyle  Take daily care of your teeth and gums.  Stay active. Exercise for at least 30 minutes on 5 or more days each week.  Do not use any products that contain nicotine or tobacco, such as cigarettes, e-cigarettes, and chewing tobacco. If you need help quitting, ask your health care provider.  If you are sexually active, practice safe sex. Use a condom or other form of protection to prevent STIs (sexually transmitted infections).  Talk with your health care provider about taking a low-dose aspirin  or statin. What's next?  Visit your health care provider once a year for a well check visit.  Ask your health care provider how often you should have your eyes and teeth checked.  Stay up to date on all vaccines. This information is not intended to replace advice given to you by your health care provider. Make sure you discuss any questions you have with your health care provider. Document Revised: 02/19/2018 Document Reviewed: 02/19/2018 Elsevier Patient Education  2020 [employer].

## 2019-05-02 ENCOUNTER — Ambulatory Visit: Payer: Medicare Other

## 2019-05-12 ENCOUNTER — Ambulatory Visit: Payer: Medicare Other

## 2019-05-12 ENCOUNTER — Ambulatory Visit: Payer: Medicare Other | Attending: Internal Medicine

## 2019-05-12 DIAGNOSIS — Z23 Encounter for immunization: Secondary | ICD-10-CM | POA: Insufficient documentation

## 2019-05-12 NOTE — Progress Notes (Signed)
   Covid-19 Vaccination Clinic  Name:  Anthony [name]    MRN: [id number] DOB: 08-Jun-1945  05/12/2019  [name] was observed post Covid-19 immunization for 15 minutes without incident. He was provided with Vaccine Information Sheet and instruction to access the V-Safe system.   [name] was instructed to call 911 with any severe reactions post vaccine: Marland Kitchen Difficulty breathing  . Swelling of face and throat  . A fast heartbeat  . A bad rash all over body  . Dizziness and weakness   Immunizations Administered    Name Date Dose VIS Date Route   Pfizer COVID-19 Vaccine 05/12/2019  8:49 AM 0.3 mL 02/19/2019 Intramuscular   Manufacturer: [place]   Lot: [id number]   [place]: [id number]

## 2019-05-18 DIAGNOSIS — H35371 Puckering of macula, right eye: Secondary | ICD-10-CM | POA: Diagnosis not present

## 2019-05-18 DIAGNOSIS — H35721 Serous detachment of retinal pigment epithelium, right eye: Secondary | ICD-10-CM | POA: Diagnosis not present

## 2019-05-18 DIAGNOSIS — Z961 Presence of intraocular lens: Secondary | ICD-10-CM | POA: Diagnosis not present

## 2019-05-18 DIAGNOSIS — H353131 Nonexudative age-related macular degeneration, bilateral, early dry stage: Secondary | ICD-10-CM | POA: Diagnosis not present

## 2019-06-17 DIAGNOSIS — D485 Neoplasm of uncertain behavior of skin: Secondary | ICD-10-CM | POA: Diagnosis not present

## 2019-06-17 DIAGNOSIS — L820 Inflamed seborrheic keratosis: Secondary | ICD-10-CM | POA: Diagnosis not present

## 2019-07-20 ENCOUNTER — Ambulatory Visit (AMBULATORY_SURGERY_CENTER): Payer: Self-pay | Admitting: *Deleted

## 2019-07-20 ENCOUNTER — Other Ambulatory Visit: Payer: Self-pay

## 2019-07-20 VITALS — Temp 97.4°F | Ht 66.0 in | Wt 180.0 lb

## 2019-07-20 DIAGNOSIS — Z86010 Personal history of colonic polyps: Secondary | ICD-10-CM

## 2019-07-20 MED ORDER — SUTAB 1479-225-188 MG PO TABS: 1.0000 | ORAL_TABLET | Freq: Once | ORAL | 0 refills | Status: AC

## 2019-07-20 NOTE — Progress Notes (Signed)
Pt denies any recent chest pain or cardiac problems.  Has regular cardiology check ups.   No egg or soy allergy known to patient  No issues with past sedation with any surgeries  or procedures, no intubation problems  No diet pills per patient No home 02 use per patient  No blood thinners per patient  Pt denies issues with constipation  No A fib or A flutter  EMMI video sent to pt's e mail   Due to the COVID-19 pandemic we are asking patients to follow these guidelines. Please only bring one care partner. Please be aware that your care partner may wait in the car in the parking lot or if they feel like they will be too hot to wait in the car, they may wait in the lobby on the 4th floor. All care partners are required to wear a mask the entire time (we do not have any that we can provide them), they need to practice social distancing, and we will do a Covid check for all patient's and care partners when you arrive. Also we will check their temperature and your temperature. If the care partner waits in their car they need to stay in the parking lot the entire time and we will call them on their cell phone when the patient is ready for discharge so they can bring the car to the front of the building. Also all patient's will need to wear a mask into building.

## 2019-07-22 ENCOUNTER — Telehealth: Payer: Self-pay | Admitting: Internal Medicine

## 2019-07-22 NOTE — Telephone Encounter (Signed)
Patient states [facility] told him it will be available for pick up on Monday. He will try to get then and if unable to pick it up then he will call us back on Monday and we will send rx to [facility].

## 2019-07-28 ENCOUNTER — Other Ambulatory Visit: Payer: Self-pay

## 2019-07-28 ENCOUNTER — Other Ambulatory Visit (INDEPENDENT_AMBULATORY_CARE_PROVIDER_SITE_OTHER): Payer: Medicare Other

## 2019-07-28 DIAGNOSIS — E782 Mixed hyperlipidemia: Secondary | ICD-10-CM | POA: Diagnosis not present

## 2019-07-28 DIAGNOSIS — I10 Essential (primary) hypertension: Secondary | ICD-10-CM

## 2019-07-28 DIAGNOSIS — R739 Hyperglycemia, unspecified: Secondary | ICD-10-CM | POA: Diagnosis not present

## 2019-07-28 LAB — COMPREHENSIVE METABOLIC PANEL
ALT: 16 U/L (ref 0–53)
AST: 15 U/L (ref 0–37)
Albumin: 4.4 g/dL (ref 3.5–5.2)
Alkaline Phosphatase: 69 U/L (ref 39–117)
BUN: 13 mg/dL (ref 6–23)
CO2: 28 mEq/L (ref 19–32)
Calcium: 8.9 mg/dL (ref 8.4–10.5)
Chloride: 105 mEq/L (ref 96–112)
Creatinine, Ser: 0.85 mg/dL (ref 0.40–1.50)
GFR: 88.09 mL/min (ref >60.00)
Glucose, Bld: 96 mg/dL (ref 70–99)
Potassium: 3.9 mEq/L (ref 3.5–5.1)
Sodium: 140 mEq/L (ref 135–145)
Total Bilirubin: 0.8 mg/dL (ref 0.2–1.2)
Total Protein: 6.8 g/dL (ref 6.0–8.3)

## 2019-07-28 LAB — LIPID PANEL
Cholesterol: 92 mg/dL (ref 0–200)
HDL: 34.4 mg/dL — ABNORMAL LOW (ref >39.00)
LDL Cholesterol: 31 mg/dL (ref 0–99)
NonHDL: 57.98
Total CHOL/HDL Ratio: 3
Triglycerides: 133 mg/dL (ref 0.0–149.0)
VLDL: 26.6 mg/dL (ref 0.0–40.0)

## 2019-07-28 LAB — CBC
HCT: 40.4 % (ref 39.0–52.0)
Hemoglobin: 14 g/dL (ref 13.0–17.0)
MCHC: 34.7 g/dL (ref 30.0–36.0)
MCV: 93.9 fl (ref 78.0–100.0)
Platelets: 222 10*3/uL (ref 150.0–400.0)
RBC: 4.31 Mil/uL (ref 4.22–5.81)
RDW: 12.8 % (ref 11.5–15.5)
WBC: 7.3 10*3/uL (ref 4.0–10.5)

## 2019-07-28 LAB — TSH: TSH: 2.47 u[IU]/mL (ref 0.35–4.50)

## 2019-08-03 ENCOUNTER — Encounter: Payer: Medicare Other | Admitting: Internal Medicine

## 2019-08-05 ENCOUNTER — Other Ambulatory Visit: Payer: Self-pay

## 2019-08-05 ENCOUNTER — Ambulatory Visit (AMBULATORY_SURGERY_CENTER): Payer: Medicare Other | Admitting: Internal Medicine

## 2019-08-05 ENCOUNTER — Encounter: Payer: Self-pay | Admitting: Internal Medicine

## 2019-08-05 VITALS — BP 134/73 | HR 65 | Temp 97.1°F | Resp 15 | Ht 66.0 in | Wt 180.0 lb

## 2019-08-05 DIAGNOSIS — D122 Benign neoplasm of ascending colon: Secondary | ICD-10-CM | POA: Diagnosis not present

## 2019-08-05 DIAGNOSIS — E785 Hyperlipidemia, unspecified: Secondary | ICD-10-CM | POA: Diagnosis not present

## 2019-08-05 DIAGNOSIS — I10 Essential (primary) hypertension: Secondary | ICD-10-CM | POA: Diagnosis not present

## 2019-08-05 DIAGNOSIS — Z86010 Personal history of colonic polyps: Secondary | ICD-10-CM | POA: Diagnosis not present

## 2019-08-05 DIAGNOSIS — I2510 Atherosclerotic heart disease of native coronary artery without angina pectoris: Secondary | ICD-10-CM | POA: Diagnosis not present

## 2019-08-05 DIAGNOSIS — E669 Obesity, unspecified: Secondary | ICD-10-CM | POA: Diagnosis not present

## 2019-08-05 MED ORDER — SODIUM CHLORIDE 0.9 % IV SOLN: 500.0000 mL | Freq: Once | INTRAVENOUS | Status: DC

## 2019-08-05 NOTE — Progress Notes (Signed)
Pt's states no medical or surgical changes since previsit or office visit. 

## 2019-08-05 NOTE — Progress Notes (Signed)
Called to room to assist during endoscopic procedure.  Patient ID and intended procedure confirmed with present staff. Received instructions for my participation in the procedure from the performing physician.  

## 2019-08-05 NOTE — Progress Notes (Signed)
Report to PACU, RN, vss, BBS= Clear.  

## 2019-08-05 NOTE — Op Note (Signed)
[facility] Patient Name: Anthony [name] Procedure Date: 08/05/2019 8:17 AM MRN: [id number] Endoscopist: Jerene Bears , MD Age: 74 Referring MD:  Date of Birth: 12-31-1945 Gender: Male Account #: [id number] Procedure:                Colonoscopy Indications:              High risk colon cancer surveillance: Personal                            history of multiple (3 or more) adenomas, Last                            colonoscopy 3 years ago Medicines:                Monitored Anesthesia Care Procedure:                Pre-Anesthesia Assessment:                           - Prior to the procedure, a History and Physical                            was performed, and patient medications and                            allergies were reviewed. The patient's tolerance of                            previous anesthesia was also reviewed. The risks                            and benefits of the procedure and the sedation                            options and risks were discussed with the patient.                            All questions were answered, and informed consent                            was obtained. Prior Anticoagulants: The patient has                            taken no previous anticoagulant or antiplatelet                            agents. ASA Grade Assessment: II - A patient with                            mild systemic disease. After reviewing the risks                            and benefits, the patient was deemed in  satisfactory condition to undergo the procedure.                           After obtaining informed consent, the colonoscope                            was passed under direct vision. Throughout the                            procedure, the patient's blood pressure, pulse, and                            oxygen saturations were monitored continuously. The                            Colonoscope was introduced through the anus  and                            advanced to the cecum, identified by appendiceal                            orifice and ileocecal valve. The colonoscopy was                            performed without difficulty. The patient tolerated                            the procedure well. The quality of the bowel                            preparation was good. The ileocecal valve,                            appendiceal orifice, and rectum were photographed. Scope In: 8:27:53 AM Scope Out: 8:40:12 AM Scope Withdrawal Time: 0 hours 10 minutes 16 seconds  Total Procedure Duration: 0 hours 12 minutes 19 seconds  Findings:                 Hemorrhoids were found on perianal exam.                           A 3 mm polyp was found in the ascending colon. The                            polyp was sessile. The polyp was removed with a                            cold snare. Resection and retrieval were complete.                           External and internal hemorrhoids were found during                            retroflexion and during digital exam. The  hemorrhoids were large. Complications:            No immediate complications. Estimated Blood Loss:     Estimated blood loss was minimal. Impression:               - One 3 mm polyp in the ascending colon, removed                            with a cold snare. Resected and retrieved.                           - External and internal hemorrhoids. Recommendation:           - Patient has a contact number available for                            emergencies. The signs and symptoms of potential                            delayed complications were discussed with the                            patient. Return to normal activities tomorrow.                            Written discharge instructions were provided to the                            patient.                           - Resume previous diet.                           -  Continue present medications.                           - Await pathology results.                           - Repeat colonoscopy is recommended for                            surveillance. The colonoscopy date will be                            determined after pathology results from today's                            exam become available for review. Jerene Bears, MD 08/05/2019 8:44:07 AM This report has been signed electronically.

## 2019-08-05 NOTE — Patient Instructions (Signed)
YOU HAD AN ENDOSCOPIC PROCEDURE TODAY AT THE [facility]:   Refer to the procedure report that was given to you for any specific questions about what was found during the examination.  If the procedure report does not answer your questions, please call your gastroenterologist to clarify.  If you requested that your care partner not be given the details of your procedure findings, then the procedure report has been included in a sealed envelope for you to review at your convenience later.  YOU SHOULD EXPECT: Some feelings of bloating in the abdomen. Passage of more gas than usual.  Walking can help get rid of the air that was put into your GI tract during the procedure and reduce the bloating. If you had a lower endoscopy (such as a colonoscopy or flexible sigmoidoscopy) you may notice spotting of blood in your stool or on the toilet paper. If you underwent a bowel prep for your procedure, you may not have a normal bowel movement for a few days.  Please Note:  You might notice some irritation and congestion in your nose or some drainage.  This is from the oxygen used during your procedure.  There is no need for concern and it should clear up in a day or so.  SYMPTOMS TO REPORT IMMEDIATELY:   Following lower endoscopy (colonoscopy or flexible sigmoidoscopy):  Excessive amounts of blood in the stool  Significant tenderness or worsening of abdominal pains  Swelling of the abdomen that is new, acute  Fever of 100F or higher   For urgent or emergent issues, a gastroenterologist can be reached at any hour by calling [phone]. Do not use MyChart messaging for urgent concerns.    DIET:  We do recommend a small meal at first, but then you may proceed to your regular diet.  Drink plenty of fluids but you should avoid alcoholic beverages for 24 hours.  MEDICATIONS: Continue present medications.  Please see handouts given to you by your recovery nurse.  ACTIVITY:  You should plan to  take it easy for the rest of today and you should NOT DRIVE or use heavy machinery until tomorrow (because of the sedation medicines used during the test).    FOLLOW UP: Our staff will call the number listed on your records 48-72 hours following your procedure to check on you and address any questions or concerns that you may have regarding the information given to you following your procedure. If we do not reach you, we will leave a message.  We will attempt to reach you two times.  During this call, we will ask if you have developed any symptoms of COVID 19. If you develop any symptoms (ie: fever, flu-like symptoms, shortness of breath, cough etc.) before then, please call [phone].  If you test positive for Covid 19 in the 2 weeks post procedure, please call and report this information to us.    If any biopsies were taken you will be contacted by phone or by letter within the next 1-3 weeks.  Please call us at [phone] if you have not heard about the biopsies in 3 weeks.   Thank you for allowing us to provide for your healthcare needs today.   SIGNATURES/CONFIDENTIALITY: You and/or your care partner have signed paperwork which will be entered into your electronic medical record.  These signatures attest to the fact that that the information above on your After Visit Summary has been reviewed and is understood.  Full responsibility of the   confidentiality of this discharge information lies with you and/or your care-partner. 

## 2019-08-10 ENCOUNTER — Telehealth: Payer: Self-pay | Admitting: *Deleted

## 2019-08-10 NOTE — Telephone Encounter (Signed)
°  Follow up Call-  Call back number 08/05/2019  Post procedure Call Back phone  # [phone]  Permission to leave phone message Yes  Some recent data might be hidden     Patient questions:  Do you have a fever, pain , or abdominal swelling? No. Pain Score  0 *  Have you tolerated food without any problems? Yes.    Have you been able to return to your normal activities? Yes.    Do you have any questions about your discharge instructions: Diet   No. Medications  No. Follow up visit  No.  Do you have questions or concerns about your Care? Yes.    Actions: * If pain score is 4 or above: No action needed, pain <4.  1. Have you developed a fever since your procedure? no  2.   Have you had an respiratory symptoms (SOB or cough) since your procedure? no  3.   Have you tested positive for COVID 19 since your procedure no  4.   Have you had any family members/close contacts diagnosed with the COVID 19 since your procedure?  no   If yes to any of these questions please route to Joylene John, RN and Erenest Rasher, RN

## 2019-08-15 ENCOUNTER — Encounter: Payer: Self-pay | Admitting: Internal Medicine

## 2019-10-19 ENCOUNTER — Ambulatory Visit: Payer: Medicare Other | Admitting: Family Medicine

## 2019-11-02 ENCOUNTER — Other Ambulatory Visit: Payer: Self-pay

## 2019-11-02 ENCOUNTER — Ambulatory Visit (INDEPENDENT_AMBULATORY_CARE_PROVIDER_SITE_OTHER): Payer: Medicare Other | Admitting: Family Medicine

## 2019-11-02 DIAGNOSIS — R739 Hyperglycemia, unspecified: Secondary | ICD-10-CM | POA: Diagnosis not present

## 2019-11-02 DIAGNOSIS — I2510 Atherosclerotic heart disease of native coronary artery without angina pectoris: Secondary | ICD-10-CM

## 2019-11-02 DIAGNOSIS — N200 Calculus of kidney: Secondary | ICD-10-CM | POA: Diagnosis not present

## 2019-11-02 DIAGNOSIS — I10 Essential (primary) hypertension: Secondary | ICD-10-CM | POA: Diagnosis not present

## 2019-11-02 DIAGNOSIS — E782 Mixed hyperlipidemia: Secondary | ICD-10-CM | POA: Diagnosis not present

## 2019-11-02 NOTE — Assessment & Plan Note (Signed)
Had a stone he passed and has given up all sodas and caffeine. Hydrate and follow up with Dr Gilford Rile at [facility].

## 2019-11-02 NOTE — Assessment & Plan Note (Signed)
Well controlled, no changes to meds. Encouraged heart healthy diet such as the DASH diet and exercise as tolerated.  °

## 2019-11-02 NOTE — Patient Instructions (Signed)

## 2019-11-02 NOTE — Assessment & Plan Note (Signed)
hgba1c acceptable, minimize simple carbs. Increase exercise as tolerated.  

## 2019-11-02 NOTE — Progress Notes (Signed)
Subjective:    Patient ID: Anthony [name], male    DOB: 03-22-1945, 74 y.o.   MRN: [id number]  Chief Complaint  Patient presents with  . 6 month follow up    HPI Patient is in today for follow up on chronic medical concerns. No recent febrile illness or hospitalizations. No polydipsia but he does note increased urination when he drinks more water, feels appropriate. He is not drinking any sodas or caffeine after he passed a kidney stone recently and he is doing better. Denies CP/palp/SOB/HA/congestion/fevers/GI or GU c/o. Taking meds as prescribed  Past Medical History:  Diagnosis Date  . Allergic rhinitis   . BPH (benign prostatic hyperplasia)   . CAD (coronary artery disease) 05/26/2009 :  primary cardiolgoist-  dr Stanford Breed   hx NSTEMI cardiac catherization  revealed-left main normal; LAD proximal calcification,  long proximal 30% stenosis,  Circumflex  AV groove   proximal  60% stenosis at mid obtuse marginal,  first large mid obtuse marginal  ostial 70% stenosis,  prox. to mid RCA  99% stenosis w/ intervention PCI and DES  . Carotid stenosis, asymptomatic, bilateral    per last duplex 03-31-2015  bilateral ICA 1-39%  . Cataract    removed  . External hemorrhoids    large external  . GERD (gastroesophageal reflux disease)   . History of adenomatous polyp of colon   . History of kidney stones   . History of non-ST elevation myocardial infarction (NSTEMI) 05/26/2009  . Hyperlipidemia   . Hypertension    8-10 years  . Left ureteral stone   . Macular degeneration   . Myocardial infarction ([place])   . Nephrolithiasis    right side non-obstructive per CT 01-03-2017  . S/P drug eluting coronary stent placement 05/26/2009   DESx1  to pRCA  . Wears glasses     Past Surgical History:  Procedure Laterality Date  . CARDIOVASCULAR STRESS TEST  12/26/2011   dr Stanford Breed   normal nuclear study w/ no ischemia/  normal LV function and wall motion , ef 71%  . CATARACT EXTRACTION W/  INTRAOCULAR LENS  IMPLANT, BILATERAL  2010  . COLONOSCOPY  last one 06-10-2016  . CORONARY ANGIOPLASTY WITH STENT PLACEMENT  05-26-2009  dr hochrein/ dr Darnell Level brodie   Severe RCA stenosis and moderate stenosis in the left system, ef 50% with inferior hypokinsis:  PCI and DES x1 to RCA ([facility])  . CYSTOSCOPY W/ RETROGRADES Bilateral 09/29/2013   Procedure: CYSTOSCOPY WITH RETROGRADE PYELOGRAM;  Surgeon: Alexis Frock, MD;  Location: [facility];  Service: Urology;  Laterality: Bilateral;  . CYSTOSCOPY WITH RETROGRADE PYELOGRAM, URETEROSCOPY AND STENT PLACEMENT N/A 06/14/2015   Procedure: CYSTOSCOPY WITH RETROGRADE PYELOGRAM, cysto litholapexy ;  Surgeon: Alexis Frock, MD;  Location: [facility];  Service: Urology;  Laterality: N/A;  . CYSTOSCOPY/URETEROSCOPY/HOLMIUM LASER/STENT PLACEMENT Left 01/07/2017   Procedure: CYSTOSCOPY/RETROGRADE/URETEROSCOPY/HOLMIUM LASER/STENT PLACEMENT, FULGERATION OF PROSTATIC URETHRA;  Surgeon: Ceasar Mons, MD;  Location: [facility];  Service: Urology;  Laterality: Left;  . HOLMIUM LASER APPLICATION N/A 07/12/2704   Procedure: HOLMIUM LASER APPLICATION bladder stone;  Surgeon: Alexis Frock, MD;  Location: [facility];  Service: Urology;  Laterality: N/A;  . HOLMIUM LASER APPLICATION Left 23/76/2831   Procedure: HOLMIUM LASER APPLICATION;  Surgeon: Ceasar Mons, MD;  Location: [facility];  Service: Urology;  Laterality: Left;  . NEPHROLITHOTOMY Left 09/29/2013   Procedure: NEPHROLITHOTOMY PERCUTANEOUS WITH ACCESS;  Surgeon: Alexis Frock, MD;  Location:  [facility];  Service: Urology;  Laterality: Left;  . URETEROSCOPY N/A 09/29/2013   Procedure: URETEROSCOPY;  Surgeon: Alexis Frock, MD;  Location: [facility];  Service: Urology;  Laterality: N/A;    Family History  Problem Relation Age of Onset  . Coronary artery disease Father        in his 63's  . Hyperlipidemia Father   . Hypertension  Father   . Hearing loss Father   . Heart disease Mother        deceased of heart disease  . Hypertension Mother   . Hyperlipidemia Mother   . Multiple sclerosis Son   . Heart disease Maternal Grandmother   . Heart disease Maternal Grandfather   . Heart disease Paternal Grandmother   . Heart disease Paternal Grandfather   . GER disease Sister   . Other Neg Hx        no lung cancer, colon cancer, or prostate  . Colon cancer Neg Hx   . Stomach cancer Neg Hx   . Esophageal cancer Neg Hx     Social History   Socioeconomic History  . Marital status: Married    Spouse name: Not on file  . Number of children: Not on file  . Years of education: Not on file  . Highest education level: Not on file  Occupational History  . Not on file  Tobacco Use  . Smoking status: Former Smoker    Years: 45.00    Types: Cigarettes    Quit date: 03/12/2007    Years since quitting: 12.6  . Smokeless tobacco: Never Used  Substance and Sexual Activity  . Alcohol use: No  . Drug use: No  . 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 Not on file  Social History Narrative   married -53 yrs  [name])   grew up in [place]   He has one son - two grandaugthers     He is retired.  He had     a 40 pack-year smoking history but has discontinued.     prev occuptation -  [employer]   Alcohol use-no   Smoking Status:  quit   Packs/Day:  0.5   Caffeine use/day:  2 beverages daily   Does Patient Exercise:  yes            Social Determinants of Health   Financial Resource Strain: Low Risk   . Difficulty of Paying Living Expenses: Not hard at all  Food Insecurity:   . Worried About Charity fundraiser in the Last Year: Not on file  . Ran Out of Food in the Last Year: Not on file  Transportation Needs: No Transportation Needs  . Lack of Transportation (Medical): No  . Lack of Transportation (Non-Medical): No   Physical Activity:   . Days of Exercise per Week: Not on file  . Minutes of Exercise per Session: Not on file  Stress:   . Feeling of Stress : Not on file  Social Connections:   . Frequency of Communication with Friends and Family: Not on file  . Frequency of Social Gatherings with Friends and Family: Not on file  . Attends Religious Services: Not on file  . Active Member of Clubs or Organizations: Not on file  . Attends Archivist Meetings: Not on file  . Marital Status: Not on file  Intimate Partner Violence:   .  Fear of Current or Ex-Partner: Not on file  . Emotionally Abused: Not on file  . Physically Abused: Not on file  . Sexually Abused: Not on file    Outpatient Medications Prior to Visit  Medication Sig Dispense Refill  . amLODipine (NORVASC) 10 MG tablet Take 1 tablet (10 mg total) by mouth every morning. 90 tablet 3  . ascorbic acid (VITAMIN C) 500 MG tablet Take 500 mg by mouth daily.    Marland Kitchen aspirin 81 MG tablet Take 81 mg by mouth every morning.     Marland Kitchen losartan (COZAAR) 100 MG tablet Take 1 tablet (100 mg total) by mouth every morning. 90 tablet 3  . metoprolol tartrate (LOPRESSOR) 25 MG tablet TAKE ONE-HALF TABLET BY  MOUTH TWO TIMES DAILY 90 tablet 3  . Multiple Vitamins-Minerals (ICAPS PO) Take by mouth every morning.     . potassium citrate (UROCIT-K) 10 MEQ (1080 MG) SR tablet takes one tablet twice daily  3  . rosuvastatin (CRESTOR) 40 MG tablet TAKE 1 TABLET BY MOUTH  DAILY 90 tablet 3  . Colesevelam HCl (WELCHOL) 3.75 g PACK Mix 1 packet with 6 oz of liquid daily (Patient not taking: Reported on 07/20/2019) 30 each 3  . 0.9 %  sodium chloride infusion      No facility-administered medications prior to visit.    No Known Allergies  Review of Systems  Constitutional: Negative for fever and malaise/fatigue.  HENT: Negative for congestion.   Eyes: Negative for blurred vision.  Respiratory: Negative for shortness of breath.   Cardiovascular: Negative  for chest pain, palpitations and leg swelling.  Gastrointestinal: Negative for abdominal pain, blood in stool and nausea.  Genitourinary: Negative for dysuria and frequency.  Musculoskeletal: Negative for falls.  Skin: Negative for rash.  Neurological: Negative for dizziness, loss of consciousness and headaches.  Endo/Heme/Allergies: Negative for environmental allergies.  Psychiatric/Behavioral: Negative for depression. The patient is not nervous/anxious.        Objective:    Physical Exam Vitals and nursing note reviewed.  Constitutional:      General: He is not in acute distress.    Appearance: He is well-developed.  HENT:     Head: Normocephalic and atraumatic.     Nose: Nose normal.  Eyes:     General:        Right eye: No discharge.        Left eye: No discharge.  Cardiovascular:     Rate and Rhythm: Normal rate and regular rhythm.     Heart sounds: No murmur heard.   Pulmonary:     Effort: Pulmonary effort is normal.     Breath sounds: Normal breath sounds.  Abdominal:     General: Bowel sounds are normal.     Palpations: Abdomen is soft.     Tenderness: There is no abdominal tenderness.  Musculoskeletal:     Cervical back: Normal range of motion and neck supple.  Skin:    General: Skin is warm and dry.  Neurological:     Mental Status: He is alert and oriented to person, place, and time.     BP 130/70 (BP Location: Left Arm, Patient Position: Sitting, Cuff Size: Large)   Pulse 66   Temp 98 F (36.7 C) (Oral)   Resp 12   Ht 5\' 5"  (1.651 m)   Wt 173 lb (78.5 kg)   SpO2 96%   BMI 28.79 kg/m  Wt Readings from Last 3 Encounters:  11/02/19 173 lb (78.5  kg)  08/05/19 180 lb (81.6 kg)  07/20/19 180 lb (81.6 kg)    Diabetic Foot Exam - Simple   No data filed     Lab Results  Component Value Date   WBC 7.3 07/28/2019   HGB 14.0 07/28/2019   HCT 40.4 07/28/2019   PLT 222.0 07/28/2019   GLUCOSE 96 07/28/2019   CHOL 92 07/28/2019   TRIG 133.0  07/28/2019   HDL 34.40 (L) 07/28/2019   LDLDIRECT 41.0 04/20/2019   LDLCALC 31 07/28/2019   ALT 16 07/28/2019   AST 15 07/28/2019   NA 140 07/28/2019   K 3.9 07/28/2019   CL 105 07/28/2019   CREATININE 0.85 07/28/2019   BUN 13 07/28/2019   CO2 28 07/28/2019   TSH 2.47 07/28/2019   PSA 3.32 03/24/2013   INR 1.05 05/25/2009   HGBA1C 6.4 04/20/2019    Lab Results  Component Value Date   TSH 2.47 07/28/2019   Lab Results  Component Value Date   WBC 7.3 07/28/2019   HGB 14.0 07/28/2019   HCT 40.4 07/28/2019   MCV 93.9 07/28/2019   PLT 222.0 07/28/2019   Lab Results  Component Value Date   NA 140 07/28/2019   K 3.9 07/28/2019   CO2 28 07/28/2019   GLUCOSE 96 07/28/2019   BUN 13 07/28/2019   CREATININE 0.85 07/28/2019   BILITOT 0.8 07/28/2019   ALKPHOS 69 07/28/2019   AST 15 07/28/2019   ALT 16 07/28/2019   PROT 6.8 07/28/2019   ALBUMIN 4.4 07/28/2019   CALCIUM 8.9 07/28/2019   ANIONGAP 12 09/30/2013   GFR 88.09 07/28/2019   Lab Results  Component Value Date   CHOL 92 07/28/2019   Lab Results  Component Value Date   HDL 34.40 (L) 07/28/2019   Lab Results  Component Value Date   LDLCALC 31 07/28/2019   Lab Results  Component Value Date   TRIG 133.0 07/28/2019   Lab Results  Component Value Date   CHOLHDL 3 07/28/2019   Lab Results  Component Value Date   HGBA1C 6.4 04/20/2019       Assessment & Plan:   Problem List Items Addressed This Visit    Hyperlipidemia, mixed    Tolerating statin, encouraged heart healthy diet, avoid trans fats, minimize simple carbs and saturated fats. Increase exercise as tolerated      Relevant Orders   Lipid panel   Hypertension    Well controlled, no changes to meds. Encouraged heart healthy diet such as the DASH diet and exercise as tolerated.       Relevant Orders   CBC   Comprehensive metabolic panel   TSH   Kidney stones    Had a stone he passed and has given up all sodas and caffeine. Hydrate and  follow up with Dr Gilford Rile at [facility]       Hyperglycemia    hgba1c acceptable, minimize simple carbs. Increase exercise as tolerated.       Relevant Orders   Hemoglobin A1c      I have discontinued Bishop Limbo "Bobby"'s Colesevelam HCl. I am also having him maintain his aspirin, Multiple Vitamins-Minerals (ICAPS PO), potassium citrate, metoprolol tartrate, rosuvastatin, ascorbic acid, amLODipine, and losartan. We will stop administering sodium chloride.  No orders of the defined types were placed in this encounter.    Penni Homans, MD

## 2019-11-02 NOTE — Assessment & Plan Note (Signed)
Tolerating statin, encouraged heart healthy diet, avoid trans fats, minimize simple carbs and saturated fats. Increase exercise as tolerated 

## 2019-11-03 LAB — COMPREHENSIVE METABOLIC PANEL
AG Ratio: 1.8 (calc) (ref 1.0–2.5)
ALT: 17 U/L (ref 9–46)
AST: 15 U/L (ref 10–35)
Albumin: 4.5 g/dL (ref 3.6–5.1)
Alkaline phosphatase (APISO): 73 U/L (ref 35–144)
BUN: 12 mg/dL (ref 7–25)
CO2: 24 mmol/L (ref 20–32)
Calcium: 9.1 mg/dL (ref 8.6–10.3)
Chloride: 105 mmol/L (ref 98–110)
Creat: 0.91 mg/dL (ref 0.70–1.18)
Globulin: 2.5 g/dL (calc) (ref 1.9–3.7)
Glucose, Bld: 92 mg/dL (ref 65–99)
Potassium: 4.3 mmol/L (ref 3.5–5.3)
Sodium: 141 mmol/L (ref 135–146)
Total Bilirubin: 0.9 mg/dL (ref 0.2–1.2)
Total Protein: 7 g/dL (ref 6.1–8.1)

## 2019-11-03 LAB — LIPID PANEL
Cholesterol: 98 mg/dL (ref <200)
HDL: 38 mg/dL — ABNORMAL LOW (ref >=40)
LDL Cholesterol (Calc): 37 mg/dL (calc)
Non-HDL Cholesterol (Calc): 60 mg/dL (calc) (ref <130)
Total CHOL/HDL Ratio: 2.6 (calc) (ref <5.0)
Triglycerides: 143 mg/dL (ref <150)

## 2019-11-03 LAB — HEMOGLOBIN A1C
Hgb A1c MFr Bld: 5.9 % of total Hgb — ABNORMAL HIGH (ref <5.7)
Mean Plasma Glucose: 123 (calc)
eAG (mmol/L): 6.8 (calc)

## 2019-11-03 LAB — CBC
HCT: 43.1 % (ref 38.5–50.0)
Hemoglobin: 14.5 g/dL (ref 13.2–17.1)
MCH: 31.5 pg (ref 27.0–33.0)
MCHC: 33.6 g/dL (ref 32.0–36.0)
MCV: 93.5 fL (ref 80.0–100.0)
MPV: 9.9 fL (ref 7.5–12.5)
Platelets: 261 10*3/uL (ref 140–400)
RBC: 4.61 10*6/uL (ref 4.20–5.80)
RDW: 12.7 % (ref 11.0–15.0)
WBC: 9.1 10*3/uL (ref 3.8–10.8)

## 2019-11-03 LAB — TSH: TSH: 2.34 mIU/L (ref 0.40–4.50)

## 2019-11-04 ENCOUNTER — Encounter: Payer: Self-pay | Admitting: Family Medicine

## 2019-11-04 ENCOUNTER — Other Ambulatory Visit: Payer: Self-pay

## 2019-11-04 ENCOUNTER — Telehealth (INDEPENDENT_AMBULATORY_CARE_PROVIDER_SITE_OTHER): Payer: Medicare Other | Admitting: Family Medicine

## 2019-11-04 ENCOUNTER — Other Ambulatory Visit: Payer: Medicare Other

## 2019-11-04 VITALS — BP 131/71 | HR 72 | Temp 98.2°F

## 2019-11-04 DIAGNOSIS — R1084 Generalized abdominal pain: Secondary | ICD-10-CM

## 2019-11-04 DIAGNOSIS — R197 Diarrhea, unspecified: Secondary | ICD-10-CM | POA: Diagnosis not present

## 2019-11-04 DIAGNOSIS — I2510 Atherosclerotic heart disease of native coronary artery without angina pectoris: Secondary | ICD-10-CM

## 2019-11-04 DIAGNOSIS — Z20822 Contact with and (suspected) exposure to covid-19: Secondary | ICD-10-CM

## 2019-11-04 NOTE — Patient Instructions (Signed)
-   imodium per instructions if needed  -plenty of fluids and healthy bland diet   -avoid dairy and red meat until feeling better and for at least 1 week  -stay home while sick except to seek medical care and consider COVID19 testing  I hope you are feeling better soon! Schedule a follow up appointment or seek medical care promptly if your symptoms worsen, new concerns arise or you are not improving with treatment.

## 2019-11-04 NOTE — Progress Notes (Signed)
Virtual Visit via Video Note  I connected with Anthony [name]  on 11/04/19 at 10:00 AM EDT by a video enabled telemedicine application and verified that I am speaking with the correct person using two identifiers.  Location patient: home, [place] Location provider:work or home office Persons participating in the virtual visit: patient, provider  I discussed the limitations of evaluation and management by telemedicine and the availability of in person appointments. The patient expressed understanding and agreed to proceed.   HPI:  Acute telemedicine visit for diarrhea: -started 4 days ago -started with a stomachache 4 days ago followed by a little diarrhea, then some more diarrhea the last few days -has had 2-4 watery to loose BM daily, no abdominal pain now -able to eat and drink -denies: fevers, NV, body aches, malaise, cough, congestion, melena, hematochezia -denies recent travel, new foods or abx use recently -no known sick contacts, reports they are pretty isolated currently -fully vaccinated for covid19 with pfizer, final dose in March 2021 -denies prior GI history  ROS: See pertinent positives and negatives per HPI.  Past Medical History:  Diagnosis Date  . Allergic rhinitis   . BPH (benign prostatic hyperplasia)   . CAD (coronary artery disease) 05/26/2009 :  primary cardiolgoist-  dr Stanford Breed   hx NSTEMI cardiac catherization  revealed-left main normal; LAD proximal calcification,  long proximal 30% stenosis,  Circumflex  AV groove   proximal  60% stenosis at mid obtuse marginal,  first large mid obtuse marginal  ostial 70% stenosis,  prox. to mid RCA  99% stenosis w/ intervention PCI and DES  . Carotid stenosis, asymptomatic, bilateral    per last duplex 03-31-2015  bilateral ICA 1-39%  . Cataract    removed  . External hemorrhoids    large external  . GERD (gastroesophageal reflux disease)   . History of adenomatous polyp of colon   . History of kidney stones   . History of  non-ST elevation myocardial infarction (NSTEMI) 05/26/2009  . Hyperlipidemia   . Hypertension    8-10 years  . Left ureteral stone   . Macular degeneration   . Myocardial infarction ([place])   . Nephrolithiasis    right side non-obstructive per CT 01-03-2017  . S/P drug eluting coronary stent placement 05/26/2009   DESx1  to pRCA  . Wears glasses     Past Surgical History:  Procedure Laterality Date  . CARDIOVASCULAR STRESS TEST  12/26/2011   dr Stanford Breed   normal nuclear study w/ no ischemia/  normal LV function and wall motion , ef 71%  . CATARACT EXTRACTION W/ INTRAOCULAR LENS  IMPLANT, BILATERAL  2010  . COLONOSCOPY  last one 06-10-2016  . CORONARY ANGIOPLASTY WITH STENT PLACEMENT  05-26-2009  dr hochrein/ dr Darnell Level brodie   Severe RCA stenosis and moderate stenosis in the left system, ef 50% with inferior hypokinsis:  PCI and DES x1 to RCA ([facility])  . CYSTOSCOPY W/ RETROGRADES Bilateral 09/29/2013   Procedure: CYSTOSCOPY WITH RETROGRADE PYELOGRAM;  Surgeon: Alexis Frock, MD;  Location: [facility];  Service: Urology;  Laterality: Bilateral;  . CYSTOSCOPY WITH RETROGRADE PYELOGRAM, URETEROSCOPY AND STENT PLACEMENT N/A 06/14/2015   Procedure: CYSTOSCOPY WITH RETROGRADE PYELOGRAM, cysto litholapexy ;  Surgeon: Alexis Frock, MD;  Location: [facility];  Service: Urology;  Laterality: N/A;  . CYSTOSCOPY/URETEROSCOPY/HOLMIUM LASER/STENT PLACEMENT Left 01/07/2017   Procedure: CYSTOSCOPY/RETROGRADE/URETEROSCOPY/HOLMIUM LASER/STENT PLACEMENT, FULGERATION OF PROSTATIC URETHRA;  Surgeon: Ceasar Mons, MD;  Location[facility];  Service: Urology;  Laterality:  Left;  . HOLMIUM LASER APPLICATION N/A 0/05/4740   Procedure: HOLMIUM LASER APPLICATION bladder stone;  Surgeon: Alexis Frock, MD;  Location: [facility];  Service: Urology;  Laterality: N/A;  . HOLMIUM LASER APPLICATION Left 59/56/3875   Procedure: HOLMIUM LASER APPLICATION;  Surgeon:  Ceasar Mons, MD;  Location: [facility];  Service: Urology;  Laterality: Left;  . NEPHROLITHOTOMY Left 09/29/2013   Procedure: NEPHROLITHOTOMY PERCUTANEOUS WITH ACCESS;  Surgeon: Alexis Frock, MD;  Location: [facility];  Service: Urology;  Laterality: Left;  . URETEROSCOPY N/A 09/29/2013   Procedure: URETEROSCOPY;  Surgeon: Alexis Frock, MD;  Location: [facility];  Service: Urology;  Laterality: N/A;    Family History  Problem Relation Age of Onset  . Coronary artery disease Father        in his 15's  . Hyperlipidemia Father   . Hypertension Father   . Hearing loss Father   . Heart disease Mother        deceased of heart disease  . Hypertension Mother   . Hyperlipidemia Mother   . Multiple sclerosis Son   . Heart disease Maternal Grandmother   . Heart disease Maternal Grandfather   . Heart disease Paternal Grandmother   . Heart disease Paternal Grandfather   . GER disease Sister   . Other Neg Hx        no lung cancer, colon cancer, or prostate  . Colon cancer Neg Hx   . Stomach cancer Neg Hx   . Esophageal cancer Neg Hx     SOCIAL HX: see HPI   Current Outpatient Medications:  .  amLODipine (NORVASC) 10 MG tablet, Take 1 tablet (10 mg total) by mouth every morning., Disp: 90 tablet, Rfl: 3 .  ascorbic acid (VITAMIN C) 500 MG tablet, Take 500 mg by mouth daily., Disp: , Rfl:  .  aspirin 81 MG tablet, Take 81 mg by mouth every morning. , Disp: , Rfl:  .  losartan (COZAAR) 100 MG tablet, Take 1 tablet (100 mg total) by mouth every morning., Disp: 90 tablet, Rfl: 3 .  metoprolol tartrate (LOPRESSOR) 25 MG tablet, TAKE ONE-HALF TABLET BY  MOUTH TWO TIMES DAILY, Disp: 90 tablet, Rfl: 3 .  Multiple Vitamins-Minerals (ICAPS PO), Take by mouth every morning. , Disp: , Rfl:  .  potassium citrate (UROCIT-K) 10 MEQ (1080 MG) SR tablet, takes one tablet twice daily, Disp: , Rfl: 3 .  rosuvastatin (CRESTOR) 40 MG tablet, TAKE 1 TABLET BY MOUTH  DAILY, Disp: 90  tablet, Rfl: 3  EXAM:  VITALS per patient if applicable: T 64.3  GENERAL: alert, oriented, appears well and in no acute distress  HEENT: atraumatic, conjunttiva clear, no obvious abnormalities on inspection of external nose and ears  NECK: normal movements of the head and neck  LUNGS: on inspection no signs of respiratory distress, breathing rate appears normal, no obvious gross SOB, gasping or wheezing  CV: no obvious cyanosis  ABD: per patient on patient self exam - abd NTTP, no rebound or guarding, no jump test  MS: moves all visible extremities without noticeable abnormality  PSYCH/NEURO: pleasant and cooperative, no obvious depression or anxiety, speech and thought processing grossly intact  ASSESSMENT AND PLAN:  Discussed the following assessment and plan:  Diarrhea, unspecified type  Generalized abdominal pain  -we discussed possible serious and likely etiologies, options for evaluation and workup, limitations of telemedicine visit vs in person visit, treatment, treatment risks and precautions. Pt prefers to treat via  telemedicine empirically rather then risking or undertaking an in person visit at this moment. The abd pain has resolved. Currently is having some mild diarrhea. Suspect enteritis - viral or food borne most likely vs other. He opted to try imodium per instructions, oral hydration, no diary/red meat. Discussed possibility of atypical presentation of [place]. Feel this is less likely, but discussed testing options and he is going to pursue testing at a local test site. > 20 minutes spent on this appointment.  Advised to seek prompt follow up telemedicine visit or in person care if worsening, new symptoms arise, or if is not improving with treatment over the next several days.   I discussed the assessment and treatment plan with the patient. The patient was provided an opportunity to ask questions and all were answered. The patient agreed with the plan and  demonstrated an understanding of the instructions.   The patient was advised to call back or seek an in-person evaluation if the symptoms worsen or if the condition fails to improve as anticipated.   Lucretia Kern, DO

## 2019-11-05 LAB — NOVEL CORONAVIRUS, NAA: SARS-CoV-2, NAA: NOT DETECTED

## 2019-11-05 LAB — SARS-COV-2, NAA 2 DAY TAT

## 2019-11-13 ENCOUNTER — Encounter (HOSPITAL_BASED_OUTPATIENT_CLINIC_OR_DEPARTMENT_OTHER): Payer: Self-pay

## 2019-11-13 ENCOUNTER — Emergency Department (HOSPITAL_BASED_OUTPATIENT_CLINIC_OR_DEPARTMENT_OTHER): Payer: Medicare Other

## 2019-11-13 ENCOUNTER — Emergency Department (HOSPITAL_BASED_OUTPATIENT_CLINIC_OR_DEPARTMENT_OTHER)
Admission: EM | Admit: 2019-11-13 | Discharge: 2019-11-13 | Disposition: A | Discharge status: Home / Self Care | Payer: Medicare Other | Attending: Emergency Medicine | Admitting: Emergency Medicine

## 2019-11-13 ENCOUNTER — Other Ambulatory Visit: Payer: Self-pay

## 2019-11-13 DIAGNOSIS — X509XXA Other and unspecified overexertion or strenuous movements or postures, initial encounter: Secondary | ICD-10-CM | POA: Insufficient documentation

## 2019-11-13 DIAGNOSIS — Y939 Activity, unspecified: Secondary | ICD-10-CM | POA: Insufficient documentation

## 2019-11-13 DIAGNOSIS — I10 Essential (primary) hypertension: Secondary | ICD-10-CM | POA: Insufficient documentation

## 2019-11-13 DIAGNOSIS — S76101A Unspecified injury of right quadriceps muscle, fascia and tendon, initial encounter: Secondary | ICD-10-CM | POA: Diagnosis present

## 2019-11-13 DIAGNOSIS — Z7982 Long term (current) use of aspirin: Secondary | ICD-10-CM | POA: Insufficient documentation

## 2019-11-13 DIAGNOSIS — I2510 Atherosclerotic heart disease of native coronary artery without angina pectoris: Secondary | ICD-10-CM | POA: Insufficient documentation

## 2019-11-13 DIAGNOSIS — Z87891 Personal history of nicotine dependence: Secondary | ICD-10-CM | POA: Diagnosis not present

## 2019-11-13 DIAGNOSIS — Y999 Unspecified external cause status: Secondary | ICD-10-CM | POA: Insufficient documentation

## 2019-11-13 DIAGNOSIS — Y929 Unspecified place or not applicable: Secondary | ICD-10-CM | POA: Insufficient documentation

## 2019-11-13 DIAGNOSIS — R531 Weakness: Secondary | ICD-10-CM | POA: Diagnosis not present

## 2019-11-13 DIAGNOSIS — M79651 Pain in right thigh: Secondary | ICD-10-CM | POA: Diagnosis not present

## 2019-11-13 DIAGNOSIS — S76111A Strain of right quadriceps muscle, fascia and tendon, initial encounter: Secondary | ICD-10-CM

## 2019-11-13 DIAGNOSIS — Z79899 Other long term (current) drug therapy: Secondary | ICD-10-CM | POA: Insufficient documentation

## 2019-11-13 DIAGNOSIS — M25551 Pain in right hip: Secondary | ICD-10-CM | POA: Diagnosis not present

## 2019-11-13 MED ORDER — CYCLOBENZAPRINE HCL 10 MG PO TABS: 10.0000 mg | ORAL_TABLET | Freq: Two times a day (BID) | ORAL | 0 refills | Status: DC | PRN

## 2019-11-13 NOTE — ED Triage Notes (Signed)
Pt reports he has been cutting wood on his property reports starting last night he developed pain in his right hip states that he feels like his leg is "giving out". Denies any injury.

## 2019-11-13 NOTE — ED Notes (Signed)
States last PM took 2 Tylenol had no relief from pain

## 2019-11-13 NOTE — ED Provider Notes (Signed)
[facility] EMERGENCY DEPARTMENT Provider Note   CSN: [id number] Arrival date & time: 11/13/19  0957     History Chief Complaint  Patient presents with  . Leg Pain    Anthony [name] is a 74 y.o. male.  HPI     74yo male with history below including CAD, hypertension, hyperlipidemia presents with concern for right leg pain. Yesterday was cutting wood on his property and last night began to develop pain to his right thigh/hip.  Feels like when he squats and goes to stand up he has weakness in the right leg and feels it is giving out.  No fevers, no abdominal pain, no back pain, no numbness. No symptoms in arm   Past Medical History:  Diagnosis Date  . Allergic rhinitis   . BPH (benign prostatic hyperplasia)   . CAD (coronary artery disease) 05/26/2009 :  primary cardiolgoist-  dr Stanford Breed   hx NSTEMI cardiac catherization  revealed-left main normal; LAD proximal calcification,  long proximal 30% stenosis,  Circumflex  AV groove   proximal  60% stenosis at mid obtuse marginal,  first large mid obtuse marginal  ostial 70% stenosis,  prox. to mid RCA  99% stenosis w/ intervention PCI and DES  . Carotid stenosis, asymptomatic, bilateral    per last duplex 03-31-2015  bilateral ICA 1-39%  . Cataract    removed  . External hemorrhoids    large external  . GERD (gastroesophageal reflux disease)   . History of adenomatous polyp of colon   . History of kidney stones   . History of non-ST elevation myocardial infarction (NSTEMI) 05/26/2009  . Hyperlipidemia   . Hypertension    8-10 years  . Left ureteral stone   . Macular degeneration   . Myocardial infarction ([place])   . Nephrolithiasis    right side non-obstructive per CT 01-03-2017  . S/P drug eluting coronary stent placement 05/26/2009   DESx1  to pRCA  . Wears glasses     Patient Active Problem List   Diagnosis Date Noted  . Skin lesion of scalp 04/16/2016  . Allergic cough 07/06/2014  . Carotid stenosis  03/17/2014  . Staghorn calculus 09/29/2013  . Paronychia 04/21/2013  . History of colonic polyps 04/05/2013  . Cerebrovascular disease 02/24/2013  . Cerumen impaction 02/08/2013  . Hyperglycemia 07/21/2012  . Kidney stones 07/20/2012  . Bruit 02/19/2012  . Medicare annual wellness visit, subsequent 02/12/2012  . Glucosuria 12/02/2011  . Hematuria 11/28/2011  . Health maintenance examination 08/17/2010  . Allergic rhinitis 01/01/2010  . BENIGN PROSTATIC HYPERTROPHY, WITH URINARY OBSTRUCTION 10/09/2009  . ABDOMINAL BRUIT 09/01/2009  . CAD, NATIVE VESSEL 06/13/2009  . Hyperlipidemia, mixed 06/12/2009  . CATARACTS 06/12/2009  . Hypertension 06/12/2009  . GERD 06/12/2009    Past Surgical History:  Procedure Laterality Date  . CARDIOVASCULAR STRESS TEST  12/26/2011   dr Stanford Breed   normal nuclear study w/ no ischemia/  normal LV function and wall motion , ef 71%  . CATARACT EXTRACTION W/ INTRAOCULAR LENS  IMPLANT, BILATERAL  2010  . COLONOSCOPY  last one 06-10-2016  . CORONARY ANGIOPLASTY WITH STENT PLACEMENT  05-26-2009  dr hochrein/ dr Darnell Level brodie   Severe RCA stenosis and moderate stenosis in the left system, ef 50% with inferior hypokinsis:  PCI and DES x1 to RCA ([facility]us)  . CYSTOSCOPY W/ RETROGRADES Bilateral 09/29/2013   Procedure: CYSTOSCOPY WITH RETROGRADE PYELOGRAM;  Surgeon: Alexis Frock, MD;  Location: [facility];  Service: Urology;  Laterality: Bilateral;  .  CYSTOSCOPY WITH RETROGRADE PYELOGRAM, URETEROSCOPY AND STENT PLACEMENT N/A 06/14/2015   Procedure: CYSTOSCOPY WITH RETROGRADE PYELOGRAM, cysto litholapexy ;  Surgeon: Alexis Frock, MD;  [facility];  Service: Urology;  Laterality: N/A;  . CYSTOSCOPY/URETEROSCOPY/HOLMIUM LASER/STENT PLACEMENT Left 01/07/2017   Procedure: CYSTOSCOPY/RETROGRADE/URETEROSCOPY/HOLMIUM LASER/STENT PLACEMENT, FULGERATION OF PROSTATIC URETHRA;  Surgeon: Ceasar Mons, MD;  [facility];   Service: Urology;  Laterality: Left;  . HOLMIUM LASER APPLICATION N/A 10/12/4194   Procedure: HOLMIUM LASER APPLICATION bladder stone;  Surgeon: Alexis Frock, MD;  Location: [facility];  Service: Urology;  Laterality: N/A;  . HOLMIUM LASER APPLICATION Left 22/29/7989   Procedure: HOLMIUM LASER APPLICATION;  Surgeon: Ceasar Mons, MD;  Location[facility];  Service: Urology;  Laterality: Left;  . NEPHROLITHOTOMY Left 09/29/2013   Procedure: NEPHROLITHOTOMY PERCUTANEOUS WITH ACCESS;  Surgeon: Alexis Frock, MD;  Location: [facility];  Service: Urology;  Laterality: Left;  . URETEROSCOPY N/A 09/29/2013   Procedure: URETEROSCOPY;  Surgeon: Alexis Frock, MD;  Location: [facility];  Service: Urology;  Laterality: N/A;       Family History  Problem Relation Age of Onset  . Coronary artery disease Father        in his 64's  . Hyperlipidemia Father   . Hypertension Father   . Hearing loss Father   . Heart disease Mother        deceased of heart disease  . Hypertension Mother   . Hyperlipidemia Mother   . Multiple sclerosis Son   . Heart disease Maternal Grandmother   . Heart disease Maternal Grandfather   . Heart disease Paternal Grandmother   . Heart disease Paternal Grandfather   . GER disease Sister   . Other Neg Hx        no lung cancer, colon cancer, or prostate  . Colon cancer Neg Hx   . Stomach cancer Neg Hx   . Esophageal cancer Neg Hx     Social History   Tobacco Use  . Smoking status: Former Smoker    Years: 45.00    Types: Cigarettes    Quit date: 03/12/2007    Years since quitting: 12.6  . Smokeless tobacco: Never Used  Substance Use Topics  . Alcohol use: No  . Drug use: No    Home Medications Prior to Admission medications   Medication Sig Start Date End Date Taking? Authorizing Provider  amLODipine (NORVASC) 10 MG tablet Take 1 tablet (10 mg total) by mouth every morning. 04/20/19   [name], MD  ascorbic acid  (VITAMIN C) 500 MG tablet Take 500 mg by mouth daily.    [provider]  aspirin 81 MG tablet Take 81 mg by mouth every morning.     [provider]  cyclobenzaprine (FLEXERIL) 10 MG tablet Take 1 tablet (10 mg total) by mouth 2 (two) times daily as needed for muscle spasms. 11/13/19   Gareth Morgan, MD  losartan (COZAAR) 100 MG tablet Take 1 tablet (100 mg total) by mouth every morning. 04/20/19   [name], MD  metoprolol tartrate (LOPRESSOR) 25 MG tablet TAKE ONE-HALF TABLET BY  MOUTH TWO TIMES DAILY 02/01/19   Lelon Perla, MD  Multiple Vitamins-Minerals (ICAPS PO) Take by mouth every morning.     [provider]  potassium citrate (UROCIT-K) 10 MEQ (1080 MG) SR tablet takes one tablet twice daily 11/18/14   [provider]  rosuvastatin (CRESTOR) 40 MG tablet TAKE  1 TABLET BY MOUTH  DAILY 02/01/19   Lelon Perla, MD    Allergies    Patient has no known allergies.  Review of Systems   Review of Systems  Constitutional: Negative for fever.  Eyes: Negative for visual disturbance.  Respiratory: Negative for shortness of breath.   Cardiovascular: Negative for chest pain.  Gastrointestinal: Negative for abdominal pain, nausea and vomiting.  Genitourinary: Negative for difficulty urinating.  Musculoskeletal: Positive for arthralgias and myalgias. Negative for back pain and neck stiffness.  Skin: Negative for rash.  Neurological: Negative for syncope and headaches.    Physical Exam Updated Vital Signs BP (!) 151/83 (BP Location: Left Arm)   Pulse (!) 102   Temp 98.5 F (36.9 C) (Oral)   Resp 20   Ht 5\' 6"  (1.676 m)   Wt 78 kg   SpO2 98%   BMI 27.76 kg/m   Physical Exam Vitals and nursing note reviewed.  Constitutional:      General: He is not in acute distress.    Appearance: Normal appearance. He is not ill-appearing, toxic-appearing or diaphoretic.  HENT:     Head: Normocephalic.  Eyes:     Conjunctiva/sclera:  Conjunctivae normal.  Cardiovascular:     Rate and Rhythm: Normal rate and regular rhythm.     Pulses: Normal pulses. No decreased pulses (normal bilateral LE pulses DP/PT).  Pulmonary:     Effort: Pulmonary effort is normal. No respiratory distress.  Abdominal:     Comments: No abdominal pain or tenderness, no pulsatile mass  Musculoskeletal:        General: Tenderness (right thigh) present. No deformity or signs of injury.     Cervical back: No rigidity.     Comments: No lumbar tenderness   Skin:    General: Skin is warm and dry.     Coloration: Skin is not jaundiced or pale.  Neurological:     General: No focal deficit present.     Mental Status: He is alert and oriented to person, place, and time.     GCS: GCS eye subscore is 4. GCS verbal subscore is 5. GCS motor subscore is 6.     Sensory: Sensation is intact. No sensory deficit.     Motor: No weakness.     Comments: Appears to have 5/5 strength with hip flexion, extension knee flexion/extension, ankle dorsi/plantar flexion.  Difficulty with extension of knee when going from squat to stand and also reports pain     ED Results / Procedures / Treatments   Labs (all labs ordered are listed, but only abnormal results are displayed) Labs Reviewed - No data to display  EKG None  Radiology US Venous Img Lower Right (DVT Study)  Result Date: 11/13/2019 CLINICAL DATA:  Right thigh and hip pain and right leg weakness today. EXAM: RIGHT LOWER EXTREMITY VENOUS DOPPLER ULTRASOUND TECHNIQUE: Gray-scale sonography with compression, as well as color and duplex ultrasound, were performed to evaluate the deep venous system(s) from the level of the common femoral vein through the popliteal and proximal calf veins. COMPARISON:  None. FINDINGS: VENOUS Normal compressibility of the common femoral, superficial femoral, and popliteal veins, as well as the visualized calf veins. Visualized portions of profunda femoral vein and great saphenous vein  unremarkable. No filling defects to suggest DVT on grayscale or color Doppler imaging. Doppler waveforms show normal direction of venous flow, normal respiratory plasticity and response to augmentation. Limited views of the contralateral common femoral vein are unremarkable. OTHER None.  Limitations: none IMPRESSION: Normal examination.  No DVT. Electronically Signed   By: Claudie Revering M.D.   On: 11/13/2019 15:33    Procedures Procedures (including critical care time)  Medications Ordered in ED Medications - No data to display  ED Course  I have reviewed the triage vital signs and the nursing notes.  Pertinent labs & imaging results that were available during my care of the patient were reviewed by me and considered in my medical decision making (see chart for details).    MDM Rules/Calculators/A&P                           74yo male with history below including CAD, hypertension, hyperlipidemia presents with concern for right leg pain. Normal pulses, doubt acute arterial occlusion. No abdominal or flank pain, doubt aortic dissection. No fever no findings to suggest psoas abscess/cellulitisother abscess. Good ROM hip and doubt septic arthritis.   No back pain, numbness or signs of back pain emergency.  Symptoms limited to thigh with pain and weakness and do not feel history or exam consistent with CVA. DVT study without signs of DVT.  Suspect likely quadriceps strain by history and exam. Given rx for flexeril and recommend PCP follow up. Patient discharged in stable condition with understanding of reasons to return.     Final Clinical Impression(s) / ED Diagnoses Final diagnoses:  Quadriceps strain, right, initial encounter    Rx / DC Orders ED Discharge Orders         Ordered    cyclobenzaprine (FLEXERIL) 10 MG tablet  2 times daily PRN        11/13/19 1540           Gareth Morgan, MD 11/14/19 1754

## 2019-11-13 NOTE — ED Notes (Signed)
C/o rt hip pain/ aches, started yesterday PM, when bending pain is worse. Has difficulty getting back up . Pain radiates from Rt hip to rt groin area down to rt knee.

## 2019-11-16 ENCOUNTER — Ambulatory Visit (INDEPENDENT_AMBULATORY_CARE_PROVIDER_SITE_OTHER): Payer: Medicare Other | Admitting: Family

## 2019-11-16 ENCOUNTER — Telehealth: Payer: Self-pay

## 2019-11-16 ENCOUNTER — Other Ambulatory Visit: Payer: Self-pay

## 2019-11-16 ENCOUNTER — Encounter: Payer: Self-pay | Admitting: Family

## 2019-11-16 VITALS — BP 142/67 | HR 92 | Temp 98.1°F | Resp 16 | Ht 65.0 in | Wt 171.8 lb

## 2019-11-16 DIAGNOSIS — M5416 Radiculopathy, lumbar region: Secondary | ICD-10-CM

## 2019-11-16 DIAGNOSIS — I2510 Atherosclerotic heart disease of native coronary artery without angina pectoris: Secondary | ICD-10-CM

## 2019-11-16 MED ORDER — METHYLPREDNISOLONE 4 MG PO TBPK: ORAL_TABLET | ORAL | 0 refills | Status: DC

## 2019-11-16 NOTE — Telephone Encounter (Signed)
Nurse Assessment Nurse: Harlow Mares, RN, Suanne Marker Date/Time (Eastern Time): 11/16/2019 9:07:26 AM Confirm and document reason for call. If symptomatic, describe symptoms. ---Caller has an issue with his leg and hips. Was seen in ED on Sat and received a muscle relaxer but this hasn't helped. Having some numbness in right leg in the calf area. Has the patient had close contact with a person known or suspected to have the novel coronavirus illness OR traveled / lives in area with major community spread (including international travel) in the last 14 days from the onset of symptoms? * If Asymptomatic, screen for exposure and travel within the last 14 days. ---No Does the patient have any new or worsening symptoms? ---Yes Will a triage be completed? ---Yes Related visit to physician within the last 2 weeks? ---Yes Does the PT have any chronic conditions? (i.e. diabetes, asthma, this includes High risk factors for pregnancy, etc.) ---Yes List chronic conditions. ---HTN; kidney stones; CAD Is this a behavioral health or substance abuse call? ---No Guidelines Guideline Title Affirmed Question Affirmed Notes Nurse Date/Time Eilene Ghazi Time) Leg Pain [1] Thigh or calf pain AND [2] only 1 side AND [3] present > 1 hour (Exception: chronic unchanged pain) Harlow Mares, RN, Rhonda 11/16/2019 9:09:27 AM Disp. Time Eilene Ghazi Time) Disposition Final User PLEASE NOTE: All timestamps contained within this report are represented as [place] Standard Time. CONFIDENTIALTY NOTICE: This fax transmission is intended only for the addressee. It contains information that is legally privileged, confidential or otherwise protected from use or disclosure. If you are not the intended recipient, you are strictly prohibited from reviewing, disclosing, copying using or disseminating any of this information or taking any action in reliance on or regarding this information. If you have received this fax in error, please notify us  immediately by telephone so that we can arrange for its return to [place]. Phone: [phone], Toll-Free: [phone], Fax: [phone] Page: 2 of 2 Call Id: [id number] 11/16/2019 9:14:38 AM See HCP within 4 Hours (or PCP triage) Yes Harlow Mares, RN, Rosalyn Charters Disagree/Comply Comply Caller Understands Yes PreDisposition Did not know what to do Care Advice Given Per Guideline SEE HCP WITHIN 4 HOURS (OR PCP TRIAGE): * IF OFFICE WILL BE OPEN: You need to be seen within the next 3 or 4 hours. Call your doctor (or NP/PA) now or as soon as the office opens. CALL EMS IF: * You develop any chest pain or shortness of breath. CARE ADVICE given per Leg Pain (Adult) guideline. Referrals REFERRED TO PCP OFFICE   Appt w/ Melissa today.

## 2019-11-16 NOTE — Patient Instructions (Signed)
Please stop ibuprofen and flexeril (muscle relaxer). You may begin medrol dose pak (steroid medication) You may use tylenol as needed for pain.  Please call if symptoms worsen or if not improved in 1-2 weeks.    Radicular Pain Radicular pain is a type of pain that spreads from your back or neck along a spinal nerve. Spinal nerves are nerves that leave the spinal cord and go to the muscles. Radicular pain is sometimes called radiculopathy, radiculitis, or a pinched nerve. When you have this type of pain, you may also have weakness, numbness, or tingling in the area of your body that is supplied by the nerve. The pain may feel sharp and burning. Depending on which spinal nerve is affected, the pain may occur in the:  Neck area (cervical radicular pain). You may also feel pain, numbness, weakness, or tingling in the arms.  Mid-spine area (thoracic radicular pain). You would feel this pain in the back and chest. This type is rare.  Lower back area (lumbar radicular pain). You would feel this pain as low back pain. You may feel pain, numbness, weakness, or tingling in the buttocks or legs. Sciatica is a type of lumbar radicular pain that shoots down the back of the leg. Radicular pain occurs when one of the spinal nerves becomes irritated or squeezed (compressed). It is often caused by something pushing on a spinal nerve, such as one of the bones of the spine (vertebrae) or one of the round cushions between vertebrae (intervertebral disks). This can result from:  An injury.  Wear and tear or aging of a disk.  The growth of a bone spur that pushes on the nerve. Radicular pain often goes away when you follow instructions from your health care provider for relieving pain at home. Follow these instructions at home: Managing pain      If directed, put ice on the affected area: ? Put ice in a plastic bag. ? Place a towel between your skin and the bag. ? Leave the ice on for 20 minutes, 2-3 times  a day.  If directed, apply heat to the affected area as often as told by your health care provider. Use the heat source that your health care provider recommends, such as a moist heat pack or a heating pad. ? Place a towel between your skin and the heat source. ? Leave the heat on for 20-30 minutes. ? Remove the heat if your skin turns bright red. This is especially important if you are unable to feel pain, heat, or cold. You may have a greater risk of getting burned. Activity   Do not sit or rest in bed for long periods of time.  Try to stay as active as possible. Ask your health care provider what type of exercise or activity is best for you.  Avoid activities that make your pain worse, such as bending and lifting.  Do not lift anything that is heavier than 10 lb (4.5 kg), or the limit that you are told, until your health care provider says that it is safe.  Practice using proper technique when lifting items. Proper lifting technique involves bending your knees and rising up.  Do strength and range-of-motion exercises only as told by your health care provider or physical therapist. General instructions  Take over-the-counter and prescription medicines only as told by your health care provider.  Pay attention to any changes in your symptoms.  Keep all follow-up visits as told by your health care provider. This  is important. ? Your health care provider may send you to a physical therapist to help with this pain. Contact a health care provider if:  Your pain and other symptoms get worse.  Your pain medicine is not helping.  Your pain has not improved after a few weeks of home care.  You have a fever. Get help right away if:  You have severe pain, weakness, or numbness.  You have difficulty with bladder or bowel control. Summary  Radicular pain is a type of pain that spreads from your back or neck along a spinal nerve.  When you have radicular pain, you may also have  weakness, numbness, or tingling in the area of your body that is supplied by the nerve.  The pain may feel sharp or burning.  Radicular pain may be treated with ice, heat, medicines, or physical therapy. This information is not intended to replace advice given to you by your health care provider. Make sure you discuss any questions you have with your health care provider. Document Revised: 09/09/2017 Document Reviewed: 09/09/2017 Elsevier Patient Education  [place].

## 2019-11-16 NOTE — Progress Notes (Signed)
Subjective:    Patient ID: Anthony [name], male    DOB: 1945/05/09, 74 y.o.   MRN: [id number]  HPI  Patient is a 74 yr old male who presents today with chief complaint of right leg pain.  Pain began last Friday. He reports that pain started after he spent 4 hours "wood cutting."   Reports pain in the right anterior thigh pain, radiates down the right leg with some numbness in the right medial shin. Pain is worse with certain postions. Went to ED . Had negative RLE doppler and was discharged home with prn flexeril. He has tried flexeril and ibuprofen without improvement. Denies loss of bowel or bladder control. Feels like if he bends forward, "My right knee is going to give out on me." Has been using a walker for stability that he has borrowed. He is requesting an rx for a walker of his own.    Review of Systems See HPI  Past Medical History:  Diagnosis Date   Allergic rhinitis    BPH (benign prostatic hyperplasia)    CAD (coronary artery disease) 05/26/2009 :  primary cardiolgoist-  dr Stanford Breed   hx NSTEMI cardiac catherization  revealed-left main normal; LAD proximal calcification,  long proximal 30% stenosis,  Circumflex  AV groove   proximal  60% stenosis at mid obtuse marginal,  first large mid obtuse marginal  ostial 70% stenosis,  prox. to mid RCA  99% stenosis w/ intervention PCI and DES   Carotid stenosis, asymptomatic, bilateral    per last duplex 03-31-2015  bilateral ICA 1-39%   Cataract    removed   External hemorrhoids    large external   GERD (gastroesophageal reflux disease)    History of adenomatous polyp of colon    History of kidney stones    History of non-ST elevation myocardial infarction (NSTEMI) 05/26/2009   Hyperlipidemia    Hypertension    8-10 years   Left ureteral stone    Macular degeneration    Myocardial infarction [facility])    Nephrolithiasis    right side non-obstructive per CT 01-03-2017   S/P drug eluting coronary stent  placement 05/26/2009   DESx1  to pRCA   Wears glasses      Social History   Socioeconomic History   Marital status: Married    Spouse name: Not on file   Number of children: Not on file   Years of education: Not on file   Highest education level: Not on file  Occupational History   Not on file  Tobacco Use   Smoking status: Former Smoker    Years: 45.00    Types: Cigarettes    Quit date: 03/12/2007    Years since quitting: 12.6   Smokeless tobacco: Never Used  Substance and Sexual Activity   Alcohol use: No   Drug use: No   Sexual activity: Yes    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  He had     a 40 pack-year smoking history but has discontinued.     prev occuptation -  [employer]   Alcohol use-no   Smoking Status:  quit   Packs/Day:  0.5   Caffeine use/day:  2 beverages daily  Does Patient Exercise:  yes            Social Determinants of Health   Financial Resource Strain: Low Risk    Difficulty of Paying Living Expenses: Not hard at all  Food Insecurity:    Worried About Charity fundraiser in the Last Year: Not on file   [employer] of Food in the Last Year: Not on file  Transportation Needs: No Transportation Needs   Lack of Transportation (Medical): No   Lack of Transportation (Non-Medical): No  Physical Activity:    Days of Exercise per Week: Not on file   Minutes of Exercise per Session: Not on file  Stress:    Feeling of Stress : Not on file  Social Connections:    Frequency of Communication with Friends and Family: Not on file   Frequency of Social Gatherings with Friends and Family: Not on file   Attends Religious Services: Not on file   Active Member of Clubs or Organizations: Not on file   Attends Theatre manager Meetings: Not on file   Marital Status: Not on file  Intimate Partner Violence:    Fear of Current or Ex-Partner: Not on file   Emotionally Abused: Not on file   Physically Abused: Not on file   Sexually Abused: Not on file    Past Surgical History:  Procedure Laterality Date   CARDIOVASCULAR STRESS TEST  12/26/2011   dr Stanford Breed   normal nuclear study w/ no ischemia/  normal LV function and wall motion , ef 71%   CATARACT EXTRACTION W/ INTRAOCULAR LENS  IMPLANT, BILATERAL  2010   COLONOSCOPY  last one 06-10-2016   CORONARY ANGIOPLASTY WITH STENT PLACEMENT  05-26-2009  dr hochrein/ dr Darnell Level brodie   Severe RCA stenosis and moderate stenosis in the left system, ef 50% with inferior hypokinsis:  PCI and DES x1 to RCA ([facility])   CYSTOSCOPY W/ RETROGRADES Bilateral 09/29/2013   Procedure: CYSTOSCOPY WITH RETROGRADE PYELOGRAM;  Surgeon: Alexis Frock, MD;  Location: [facility];  Service: Urology;  Laterality: Bilateral;   CYSTOSCOPY WITH RETROGRADE PYELOGRAM, URETEROSCOPY AND STENT PLACEMENT N/A 06/14/2015   Procedure: CYSTOSCOPY WITH RETROGRADE PYELOGRAM, cysto litholapexy ;  Surgeon: Alexis Frock, MD;  Location: [facility];  Service: Urology;  Laterality: N/A;   CYSTOSCOPY/URETEROSCOPY/HOLMIUM LASER/STENT PLACEMENT Left 01/07/2017   Procedure: CYSTOSCOPY/RETROGRADE/URETEROSCOPY/HOLMIUM LASER/STENT PLACEMENT, FULGERATION OF PROSTATIC URETHRA;  Surgeon: Ceasar Mons, MD;  Location: [facility];  Service: Urology;  Laterality: Left;   HOLMIUM LASER APPLICATION N/A 11/14/6211   Procedure: HOLMIUM LASER APPLICATION bladder stone;  Surgeon: Alexis Frock, MD;  Location[facility];  Service: Urology;  Laterality: N/A;   HOLMIUM LASER APPLICATION Left 08/65/7846   Procedure: HOLMIUM LASER APPLICATION;  Surgeon: Ceasar Mons, MD;  Location: [facility];  Service: Urology;  Laterality: Left;     NEPHROLITHOTOMY Left 09/29/2013   Procedure: NEPHROLITHOTOMY PERCUTANEOUS WITH ACCESS;  Surgeon: Alexis Frock, MD;  Location: [facility];  Service: Urology;  Laterality: Left;   URETEROSCOPY N/A 09/29/2013   Procedure: URETEROSCOPY;  Surgeon: Alexis Frock, MD;  Location: [facility];  Service: Urology;  Laterality: N/A;    Family History  Problem Relation Age of Onset   Coronary artery disease Father        in his 68's   Hyperlipidemia Father    Hypertension Father    Hearing loss Father    Heart disease Mother  deceased of heart disease   Hypertension Mother    Hyperlipidemia Mother    Multiple sclerosis Son    Heart disease Maternal Grandmother    Heart disease Maternal Grandfather    Heart disease Paternal Grandmother    Heart disease Paternal Grandfather    GER disease Sister    Other Neg Hx        no lung cancer, colon cancer, or prostate   Colon cancer Neg Hx    Stomach cancer Neg Hx    Esophageal cancer Neg Hx     No Known Allergies  Current Outpatient Medications on File Prior to Visit  Medication Sig Dispense Refill   amLODipine (NORVASC) 10 MG tablet Take 1 tablet (10 mg total) by mouth every morning. 90 tablet 3   ascorbic acid (VITAMIN C) 500 MG tablet Take 500 mg by mouth daily.     aspirin 81 MG tablet Take 81 mg by mouth every morning.      cyclobenzaprine (FLEXERIL) 10 MG tablet Take 1 tablet (10 mg total) by mouth 2 (two) times daily as needed for muscle spasms. 20 tablet 0   losartan (COZAAR) 100 MG tablet Take 1 tablet (100 mg total) by mouth every morning. 90 tablet 3   metoprolol tartrate (LOPRESSOR) 25 MG tablet TAKE ONE-HALF TABLET BY  MOUTH TWO TIMES DAILY 90 tablet 3   Multiple Vitamins-Minerals (ICAPS PO) Take by mouth every morning.      potassium citrate (UROCIT-K) 10 MEQ (1080 MG) SR tablet takes one tablet twice daily  3   rosuvastatin (CRESTOR) 40 MG tablet TAKE 1 TABLET BY MOUTH  DAILY 90 tablet 3   No current  facility-administered medications on file prior to visit.    BP (!) 142/67 (BP Location: Right Arm, Patient Position: Sitting, Cuff Size: Small)    Pulse 92    Temp 98.1 F (36.7 C) (Oral)    Resp 16    Ht 5\' 5"  (1.651 m)    Wt 171 lb 12.8 oz (77.9 kg)    SpO2 98%    BMI 28.59 kg/m       Objective:   Physical Exam Constitutional:      General: He is not in acute distress.    Appearance: He is well-developed.  HENT:     Head: Normocephalic and atraumatic.  Cardiovascular:     Rate and Rhythm: Normal rate and regular rhythm.     Heart sounds: No murmur heard.   Pulmonary:     Effort: Pulmonary effort is normal. No respiratory distress.     Breath sounds: Normal breath sounds. No wheezing or rales.  Skin:    General: Skin is warm and dry.  Neurological:     Mental Status: He is alert and oriented to person, place, and time.     Deep Tendon Reflexes:     Reflex Scores:      Patellar reflexes are 2+ on the right side and 2+ on the left side.    Comments: Bilateral LE strength is 5/5 Decreased sensitivity to light touch right medial shin  Psychiatric:        Behavior: Behavior normal.        Thought Content: Thought content normal.           Assessment & Plan:  Lumbar radiculopathy- uncontrolled. rx provided for rolling walker.  Pt is advised as follows:  Please stop ibuprofen and flexeril (muscle relaxer). You may begin medrol dose pak (steroid medication) You may use  tylenol as needed for pain.  Please call if symptoms worsen or if not improved in 1-2 weeks.   This visit occurred during the SARS-CoV-2 public health emergency.  Safety protocols were in place, including screening questions prior to the visit, additional usage of staff PPE, and extensive cleaning of exam room while observing appropriate contact time as indicated for disinfecting solutions.

## 2019-11-19 DIAGNOSIS — M25551 Pain in right hip: Secondary | ICD-10-CM | POA: Diagnosis not present

## 2019-11-19 DIAGNOSIS — M5416 Radiculopathy, lumbar region: Secondary | ICD-10-CM | POA: Diagnosis not present

## 2019-11-22 DIAGNOSIS — M545 Low back pain: Secondary | ICD-10-CM | POA: Diagnosis not present

## 2019-11-23 ENCOUNTER — Encounter: Payer: Self-pay | Admitting: Family Medicine

## 2019-11-24 DIAGNOSIS — M25551 Pain in right hip: Secondary | ICD-10-CM | POA: Diagnosis not present

## 2019-11-29 DIAGNOSIS — M79661 Pain in right lower leg: Secondary | ICD-10-CM | POA: Diagnosis not present

## 2019-11-29 DIAGNOSIS — M5416 Radiculopathy, lumbar region: Secondary | ICD-10-CM | POA: Diagnosis not present

## 2019-11-29 DIAGNOSIS — R262 Difficulty in walking, not elsewhere classified: Secondary | ICD-10-CM | POA: Diagnosis not present

## 2019-11-29 DIAGNOSIS — M6281 Muscle weakness (generalized): Secondary | ICD-10-CM | POA: Diagnosis not present

## 2019-12-01 DIAGNOSIS — M79661 Pain in right lower leg: Secondary | ICD-10-CM | POA: Diagnosis not present

## 2019-12-01 DIAGNOSIS — M5416 Radiculopathy, lumbar region: Secondary | ICD-10-CM | POA: Diagnosis not present

## 2019-12-01 DIAGNOSIS — R262 Difficulty in walking, not elsewhere classified: Secondary | ICD-10-CM | POA: Diagnosis not present

## 2019-12-01 DIAGNOSIS — M6281 Muscle weakness (generalized): Secondary | ICD-10-CM | POA: Diagnosis not present

## 2019-12-02 ENCOUNTER — Other Ambulatory Visit: Payer: Self-pay

## 2019-12-02 ENCOUNTER — Ambulatory Visit (INDEPENDENT_AMBULATORY_CARE_PROVIDER_SITE_OTHER): Payer: Medicare Other

## 2019-12-02 DIAGNOSIS — Z23 Encounter for immunization: Secondary | ICD-10-CM

## 2019-12-02 NOTE — Progress Notes (Signed)
Patient here today for high dose flu shot. Injection administer im on left deltoid. Patient tolerated well.  Anthony [name]

## 2019-12-06 DIAGNOSIS — M5416 Radiculopathy, lumbar region: Secondary | ICD-10-CM | POA: Diagnosis not present

## 2019-12-06 DIAGNOSIS — M79661 Pain in right lower leg: Secondary | ICD-10-CM | POA: Diagnosis not present

## 2019-12-06 DIAGNOSIS — M6281 Muscle weakness (generalized): Secondary | ICD-10-CM | POA: Diagnosis not present

## 2019-12-06 DIAGNOSIS — R262 Difficulty in walking, not elsewhere classified: Secondary | ICD-10-CM | POA: Diagnosis not present

## 2019-12-08 DIAGNOSIS — M79661 Pain in right lower leg: Secondary | ICD-10-CM | POA: Diagnosis not present

## 2019-12-08 DIAGNOSIS — M6281 Muscle weakness (generalized): Secondary | ICD-10-CM | POA: Diagnosis not present

## 2019-12-08 DIAGNOSIS — R262 Difficulty in walking, not elsewhere classified: Secondary | ICD-10-CM | POA: Diagnosis not present

## 2019-12-08 DIAGNOSIS — M5416 Radiculopathy, lumbar region: Secondary | ICD-10-CM | POA: Diagnosis not present

## 2019-12-14 DIAGNOSIS — M79661 Pain in right lower leg: Secondary | ICD-10-CM | POA: Diagnosis not present

## 2019-12-14 DIAGNOSIS — M79662 Pain in left lower leg: Secondary | ICD-10-CM | POA: Diagnosis not present

## 2019-12-14 DIAGNOSIS — R262 Difficulty in walking, not elsewhere classified: Secondary | ICD-10-CM | POA: Diagnosis not present

## 2019-12-14 DIAGNOSIS — M5416 Radiculopathy, lumbar region: Secondary | ICD-10-CM | POA: Diagnosis not present

## 2019-12-14 DIAGNOSIS — M6281 Muscle weakness (generalized): Secondary | ICD-10-CM | POA: Diagnosis not present

## 2019-12-15 DIAGNOSIS — M5416 Radiculopathy, lumbar region: Secondary | ICD-10-CM | POA: Diagnosis not present

## 2019-12-16 DIAGNOSIS — M79661 Pain in right lower leg: Secondary | ICD-10-CM | POA: Diagnosis not present

## 2019-12-16 DIAGNOSIS — M79662 Pain in left lower leg: Secondary | ICD-10-CM | POA: Diagnosis not present

## 2019-12-16 DIAGNOSIS — M5416 Radiculopathy, lumbar region: Secondary | ICD-10-CM | POA: Diagnosis not present

## 2019-12-16 DIAGNOSIS — M6281 Muscle weakness (generalized): Secondary | ICD-10-CM | POA: Diagnosis not present

## 2019-12-16 DIAGNOSIS — R262 Difficulty in walking, not elsewhere classified: Secondary | ICD-10-CM | POA: Diagnosis not present

## 2019-12-20 DIAGNOSIS — M6281 Muscle weakness (generalized): Secondary | ICD-10-CM | POA: Diagnosis not present

## 2019-12-20 DIAGNOSIS — M5416 Radiculopathy, lumbar region: Secondary | ICD-10-CM | POA: Diagnosis not present

## 2019-12-20 DIAGNOSIS — R262 Difficulty in walking, not elsewhere classified: Secondary | ICD-10-CM | POA: Diagnosis not present

## 2019-12-20 DIAGNOSIS — M79661 Pain in right lower leg: Secondary | ICD-10-CM | POA: Diagnosis not present

## 2019-12-22 DIAGNOSIS — M5416 Radiculopathy, lumbar region: Secondary | ICD-10-CM | POA: Diagnosis not present

## 2019-12-22 DIAGNOSIS — R262 Difficulty in walking, not elsewhere classified: Secondary | ICD-10-CM | POA: Diagnosis not present

## 2019-12-22 DIAGNOSIS — M79661 Pain in right lower leg: Secondary | ICD-10-CM | POA: Diagnosis not present

## 2019-12-22 DIAGNOSIS — M6281 Muscle weakness (generalized): Secondary | ICD-10-CM | POA: Diagnosis not present

## 2019-12-27 DIAGNOSIS — R262 Difficulty in walking, not elsewhere classified: Secondary | ICD-10-CM | POA: Diagnosis not present

## 2019-12-27 DIAGNOSIS — M79661 Pain in right lower leg: Secondary | ICD-10-CM | POA: Diagnosis not present

## 2019-12-27 DIAGNOSIS — M6281 Muscle weakness (generalized): Secondary | ICD-10-CM | POA: Diagnosis not present

## 2019-12-27 DIAGNOSIS — M5416 Radiculopathy, lumbar region: Secondary | ICD-10-CM | POA: Diagnosis not present

## 2019-12-29 DIAGNOSIS — M6281 Muscle weakness (generalized): Secondary | ICD-10-CM | POA: Diagnosis not present

## 2019-12-29 DIAGNOSIS — M79661 Pain in right lower leg: Secondary | ICD-10-CM | POA: Diagnosis not present

## 2019-12-29 DIAGNOSIS — M5416 Radiculopathy, lumbar region: Secondary | ICD-10-CM | POA: Diagnosis not present

## 2019-12-29 DIAGNOSIS — R262 Difficulty in walking, not elsewhere classified: Secondary | ICD-10-CM | POA: Diagnosis not present

## 2020-01-06 DIAGNOSIS — N200 Calculus of kidney: Secondary | ICD-10-CM | POA: Diagnosis not present

## 2020-01-06 DIAGNOSIS — K4090 Unilateral inguinal hernia, without obstruction or gangrene, not specified as recurrent: Secondary | ICD-10-CM | POA: Diagnosis not present

## 2020-01-06 DIAGNOSIS — N132 Hydronephrosis with renal and ureteral calculous obstruction: Secondary | ICD-10-CM | POA: Diagnosis not present

## 2020-01-06 DIAGNOSIS — R1084 Generalized abdominal pain: Secondary | ICD-10-CM | POA: Diagnosis not present

## 2020-01-06 DIAGNOSIS — K429 Umbilical hernia without obstruction or gangrene: Secondary | ICD-10-CM | POA: Diagnosis not present

## 2020-01-06 DIAGNOSIS — R3121 Asymptomatic microscopic hematuria: Secondary | ICD-10-CM | POA: Diagnosis not present

## 2020-01-06 DIAGNOSIS — N400 Enlarged prostate without lower urinary tract symptoms: Secondary | ICD-10-CM | POA: Diagnosis not present

## 2020-01-07 ENCOUNTER — Other Ambulatory Visit: Payer: Self-pay | Admitting: Urology

## 2020-01-07 ENCOUNTER — Other Ambulatory Visit: Payer: Self-pay

## 2020-01-07 ENCOUNTER — Encounter (HOSPITAL_BASED_OUTPATIENT_CLINIC_OR_DEPARTMENT_OTHER): Payer: Self-pay | Admitting: Urology

## 2020-01-07 NOTE — Progress Notes (Signed)
Spoke w/ via phone for pre-op interview--- PT Lab needs dos---- Istat              Lab results------ current ekg in epic/ chart COVID test ------ 01-08-2020 @ 0935 Arrive at ------- 1200 NPO after MN NO Solid Food.  Clear liquids from MN until--- 1100 Medications to take morning of surgery ----- Lopressor, Norvasc Diabetic medication ----- n/a Patient Special Instructions ----- n/a Pre-Op special Istructions ----- n/a Patient verbalized understanding of instructions that were given at this phone interview. Patient denies shortness of breath, chest pain, fever, cough at this phone interview.   Anesthesia :   HTN,   CAD w/ hx NSTEMI 03/ 2011 post PCI and DES to RCA, mild carotid stenosis.  Pt denies any cardiac s&s, sob, or difficulty breathing.  PCP:  Dr Charlett Blake (lov 11-16-2019 epic) Cardiologist :  Dr Stanford Breed (lov 03-24-2019 epic) Chest x-ray : 03-23-2017  epic EKG :  03-24-2019 epic Echo : no Stress test:  02-26-2012 epic Cardiac Cath :  05-26-2009 epic Activity level:   Per pt no issues  Sleep Study/ CPAP :  NO Fasting Blood Sugar :      / Checks Blood Sugar -- times a day:   N/A Blood Thinner/ Instructions Maryjane Hurter Dose:  NO ASA / Instructions/ Last Dose :  ASA 81 mg/  Per pt was told by Dr Lovena Neighbours office to stop taking asa today

## 2020-01-08 ENCOUNTER — Other Ambulatory Visit (HOSPITAL_COMMUNITY)
Admission: RE | Admit: 2020-01-08 | Discharge: 2020-01-08 | Disposition: A | Discharge status: Home / Self Care | Payer: Medicare Other | Source: Ambulatory Visit | Attending: Urology | Admitting: Urology

## 2020-01-08 DIAGNOSIS — Z20822 Contact with and (suspected) exposure to covid-19: Secondary | ICD-10-CM | POA: Diagnosis not present

## 2020-01-08 DIAGNOSIS — Z01812 Encounter for preprocedural laboratory examination: Secondary | ICD-10-CM | POA: Insufficient documentation

## 2020-01-09 LAB — SARS CORONAVIRUS 2 (TAT 6-24 HRS): SARS Coronavirus 2: NEGATIVE

## 2020-01-12 ENCOUNTER — Ambulatory Visit (HOSPITAL_BASED_OUTPATIENT_CLINIC_OR_DEPARTMENT_OTHER): Payer: Medicare Other | Admitting: Anesthesiology

## 2020-01-12 ENCOUNTER — Ambulatory Visit (HOSPITAL_COMMUNITY): Payer: Medicare Other

## 2020-01-12 ENCOUNTER — Other Ambulatory Visit: Payer: Self-pay

## 2020-01-12 ENCOUNTER — Encounter (HOSPITAL_BASED_OUTPATIENT_CLINIC_OR_DEPARTMENT_OTHER): Payer: Self-pay | Admitting: Urology

## 2020-01-12 ENCOUNTER — Ambulatory Visit (HOSPITAL_BASED_OUTPATIENT_CLINIC_OR_DEPARTMENT_OTHER)
Admission: RE | Admit: 2020-01-12 | Discharge: 2020-01-12 | Disposition: A | Discharge status: Home / Self Care | Payer: Medicare Other | Attending: Urology | Admitting: Urology

## 2020-01-12 ENCOUNTER — Encounter (HOSPITAL_BASED_OUTPATIENT_CLINIC_OR_DEPARTMENT_OTHER)
Admission: RE | Disposition: A | Discharge status: Home / Self Care | Payer: Self-pay | Source: Home / Self Care | Attending: Urology

## 2020-01-12 DIAGNOSIS — Z87891 Personal history of nicotine dependence: Secondary | ICD-10-CM | POA: Diagnosis not present

## 2020-01-12 DIAGNOSIS — Z87442 Personal history of urinary calculi: Secondary | ICD-10-CM | POA: Diagnosis not present

## 2020-01-12 DIAGNOSIS — N202 Calculus of kidney with calculus of ureter: Secondary | ICD-10-CM | POA: Diagnosis not present

## 2020-01-12 DIAGNOSIS — N3289 Other specified disorders of bladder: Secondary | ICD-10-CM | POA: Diagnosis not present

## 2020-01-12 DIAGNOSIS — N401 Enlarged prostate with lower urinary tract symptoms: Secondary | ICD-10-CM | POA: Diagnosis not present

## 2020-01-12 DIAGNOSIS — I2510 Atherosclerotic heart disease of native coronary artery without angina pectoris: Secondary | ICD-10-CM | POA: Diagnosis not present

## 2020-01-12 DIAGNOSIS — N132 Hydronephrosis with renal and ureteral calculous obstruction: Secondary | ICD-10-CM | POA: Insufficient documentation

## 2020-01-12 DIAGNOSIS — Z01818 Encounter for other preprocedural examination: Secondary | ICD-10-CM | POA: Diagnosis not present

## 2020-01-12 DIAGNOSIS — N201 Calculus of ureter: Secondary | ICD-10-CM

## 2020-01-12 DIAGNOSIS — I10 Essential (primary) hypertension: Secondary | ICD-10-CM | POA: Diagnosis not present

## 2020-01-12 DIAGNOSIS — N138 Other obstructive and reflux uropathy: Secondary | ICD-10-CM | POA: Diagnosis not present

## 2020-01-12 HISTORY — PX: CYSTOSCOPY/URETEROSCOPY/HOLMIUM LASER/STENT PLACEMENT: SHX6546

## 2020-01-12 LAB — POCT I-STAT, CHEM 8
BUN: 11 mg/dL (ref 8–23)
Calcium, Ion: 1.23 mmol/L (ref 1.15–1.40)
Chloride: 103 mmol/L (ref 98–111)
Creatinine, Ser: 0.7 mg/dL (ref 0.61–1.24)
Glucose, Bld: 93 mg/dL (ref 70–99)
HCT: 41 % (ref 39.0–52.0)
Hemoglobin: 13.9 g/dL (ref 13.0–17.0)
Potassium: 3.5 mmol/L (ref 3.5–5.1)
Sodium: 142 mmol/L (ref 135–145)
TCO2: 26 mmol/L (ref 22–32)

## 2020-01-12 SURGERY — CYSTOSCOPY/URETEROSCOPY/HOLMIUM LASER/STENT PLACEMENT
Anesthesia: General | Site: Ureter | Laterality: Right

## 2020-01-12 MED ORDER — PHENYLEPHRINE 40 MCG/ML (10ML) SYRINGE FOR IV PUSH (FOR BLOOD PRESSURE SUPPORT)
PREFILLED_SYRINGE | INTRAVENOUS | Status: AC
  Filled 2020-01-12: qty 10

## 2020-01-12 MED ORDER — PHENYLEPHRINE 40 MCG/ML (10ML) SYRINGE FOR IV PUSH (FOR BLOOD PRESSURE SUPPORT)
PREFILLED_SYRINGE | INTRAVENOUS | Status: DC | PRN
  Administered 2020-01-12 (×2): 80 ug via INTRAVENOUS
  Administered 2020-01-12: 120 ug via INTRAVENOUS

## 2020-01-12 MED ORDER — PROPOFOL 10 MG/ML IV BOLUS
INTRAVENOUS | Status: AC
  Filled 2020-01-12: qty 20

## 2020-01-12 MED ORDER — OXYCODONE HCL 5 MG/5ML PO SOLN: 5.0000 mg | Freq: Once | ORAL | Status: DC | PRN

## 2020-01-12 MED ORDER — FENTANYL CITRATE (PF) 100 MCG/2ML IJ SOLN
INTRAMUSCULAR | Status: DC | PRN
  Administered 2020-01-12 (×2): 50 ug via INTRAVENOUS

## 2020-01-12 MED ORDER — GLYCOPYRROLATE PF 0.2 MG/ML IJ SOSY
PREFILLED_SYRINGE | INTRAMUSCULAR | Status: DC | PRN
  Administered 2020-01-12: .2 mg via INTRAVENOUS

## 2020-01-12 MED ORDER — ONDANSETRON HCL 4 MG/2ML IJ SOLN
INTRAMUSCULAR | Status: DC | PRN
  Administered 2020-01-12: 4 mg via INTRAVENOUS

## 2020-01-12 MED ORDER — OXYCODONE HCL 5 MG PO TABS: 5.0000 mg | ORAL_TABLET | Freq: Once | ORAL | Status: DC | PRN

## 2020-01-12 MED ORDER — SODIUM CHLORIDE 0.9 % IR SOLN
Status: DC | PRN
  Administered 2020-01-12: 3000 mL

## 2020-01-12 MED ORDER — CEFAZOLIN SODIUM-DEXTROSE 2-4 GM/100ML-% IV SOLN
INTRAVENOUS | Status: AC
  Filled 2020-01-12: qty 100

## 2020-01-12 MED ORDER — GLYCOPYRROLATE PF 0.2 MG/ML IJ SOSY
PREFILLED_SYRINGE | INTRAMUSCULAR | Status: AC
  Filled 2020-01-12: qty 1

## 2020-01-12 MED ORDER — LIDOCAINE 2% (20 MG/ML) 5 ML SYRINGE
INTRAMUSCULAR | Status: AC
  Filled 2020-01-12: qty 5

## 2020-01-12 MED ORDER — ONDANSETRON HCL 4 MG/2ML IJ SOLN
INTRAMUSCULAR | Status: AC
  Filled 2020-01-12: qty 2

## 2020-01-12 MED ORDER — PROPOFOL 10 MG/ML IV BOLUS
INTRAVENOUS | Status: DC | PRN
  Administered 2020-01-12: 150 mg via INTRAVENOUS
  Administered 2020-01-12: 50 mg via INTRAVENOUS

## 2020-01-12 MED ORDER — KETOROLAC TROMETHAMINE 30 MG/ML IJ SOLN
INTRAMUSCULAR | Status: AC
  Filled 2020-01-12: qty 1

## 2020-01-12 MED ORDER — FENTANYL CITRATE (PF) 100 MCG/2ML IJ SOLN: 25.0000 ug | INTRAMUSCULAR | Status: DC | PRN

## 2020-01-12 MED ORDER — DEXAMETHASONE SODIUM PHOSPHATE 10 MG/ML IJ SOLN
INTRAMUSCULAR | Status: DC | PRN
  Administered 2020-01-12: 10 mg via INTRAVENOUS

## 2020-01-12 MED ORDER — KETOROLAC TROMETHAMINE 15 MG/ML IJ SOLN
INTRAMUSCULAR | Status: DC | PRN
  Administered 2020-01-12: 15 mg via INTRAVENOUS

## 2020-01-12 MED ORDER — BELLADONNA ALKALOIDS-OPIUM 16.2-60 MG RE SUPP
RECTAL | Status: AC
  Filled 2020-01-12: qty 1

## 2020-01-12 MED ORDER — BELLADONNA ALKALOIDS-OPIUM 16.2-60 MG RE SUPP
RECTAL | Status: DC | PRN
  Administered 2020-01-12: 1 via RECTAL

## 2020-01-12 MED ORDER — ONDANSETRON HCL 4 MG/2ML IJ SOLN: 4.0000 mg | Freq: Once | INTRAMUSCULAR | Status: DC | PRN

## 2020-01-12 MED ORDER — LACTATED RINGERS IV SOLN: INTRAVENOUS | Status: DC

## 2020-01-12 MED ORDER — IOHEXOL 300 MG/ML  SOLN
INTRAMUSCULAR | Status: DC | PRN
  Administered 2020-01-12: 6 mL via URETHRAL

## 2020-01-12 MED ORDER — DEXAMETHASONE SODIUM PHOSPHATE 10 MG/ML IJ SOLN
INTRAMUSCULAR | Status: AC
  Filled 2020-01-12: qty 1

## 2020-01-12 MED ORDER — CEFAZOLIN SODIUM-DEXTROSE 2-4 GM/100ML-% IV SOLN
2.0000 g | Freq: Once | INTRAVENOUS | Status: AC
  Administered 2020-01-12: 2 g via INTRAVENOUS

## 2020-01-12 MED ORDER — FENTANYL CITRATE (PF) 100 MCG/2ML IJ SOLN
INTRAMUSCULAR | Status: AC
  Filled 2020-01-12: qty 2

## 2020-01-12 MED ORDER — LIDOCAINE 2% (20 MG/ML) 5 ML SYRINGE
INTRAMUSCULAR | Status: DC | PRN
  Administered 2020-01-12: 80 mg via INTRAVENOUS

## 2020-01-12 SURGICAL SUPPLY — 26 items
APL SKNCLS STERI-STRIP NONHPOA (GAUZE/BANDAGES/DRESSINGS)
BAG DRAIN URO-CYSTO SKYTR STRL (DRAIN) ×3 IMPLANT
BAG DRN UROCATH (DRAIN) ×1
BASKET STONE 1.7 NGAGE (UROLOGICAL SUPPLIES) IMPLANT
BASKET ZERO TIP NITINOL 2.4FR (BASKET) ×3 IMPLANT
BENZOIN TINCTURE PRP APPL 2/3 (GAUZE/BANDAGES/DRESSINGS) IMPLANT
BSKT STON RTRVL ZERO TP 2.4FR (BASKET) ×1
CATH URET 5FR 28IN OPEN ENDED (CATHETERS) ×3 IMPLANT
CLOSURE WOUND 1/2 X4 (GAUZE/BANDAGES/DRESSINGS)
CLOTH BEACON ORANGE TIMEOUT ST (SAFETY) ×3 IMPLANT
FIBER LASER FLEXIVA 365 (UROLOGICAL SUPPLIES) IMPLANT
FIBER LASER TRAC TIP (UROLOGICAL SUPPLIES) ×3 IMPLANT
GLOVE BIO SURGEON STRL SZ7.5 (GLOVE) ×3 IMPLANT
GOWN STRL REUS W/TWL XL LVL3 (GOWN DISPOSABLE) ×6 IMPLANT
GUIDEWIRE STR DUAL SENSOR (WIRE) IMPLANT
GUIDEWIRE ZIPWRE .038 STRAIGHT (WIRE) ×3 IMPLANT
IV NS IRRIG 3000ML ARTHROMATIC (IV SOLUTION) ×6 IMPLANT
KIT TURNOVER CYSTO (KITS) ×3 IMPLANT
MANIFOLD NEPTUNE II (INSTRUMENTS) ×3 IMPLANT
NS IRRIG 500ML POUR BTL (IV SOLUTION) IMPLANT
PACK CYSTO (CUSTOM PROCEDURE TRAY) ×3 IMPLANT
STRIP CLOSURE SKIN 1/2X4 (GAUZE/BANDAGES/DRESSINGS) IMPLANT
SYR 10ML LL (SYRINGE) ×3 IMPLANT
TUBE CONNECTING 12'X1/4 (SUCTIONS) ×1
TUBE CONNECTING 12X1/4 (SUCTIONS) ×2 IMPLANT
TUBING UROLOGY SET (TUBING) ×3 IMPLANT

## 2020-01-12 NOTE — Transfer of Care (Signed)
Immediate Anesthesia Transfer of Care Note  Patient: Anthony [name]  Procedure(s) Performed: CYSTOSCOPY/ RIGHT RETROGRADE/ RIGHT URETEROSCOPY/ RIGHT HOLMIUM LASER/ RIGHT STENT PLACEMENT (Right Ureter)  Patient Location: PACU  Anesthesia Type:General  Level of Consciousness: awake, alert , oriented and patient cooperative  Airway & Oxygen Therapy: Patient Spontanous Breathing and Patient connected to nasal cannula oxygen  Post-op Assessment: Report given to RN and Post -op Vital signs reviewed and stable  Post vital signs: Reviewed and stable  Last Vitals:  Vitals Value Taken Time  BP 136/69 01/12/20 1343  Temp    Pulse 110 01/12/20 1345  Resp 13 01/12/20 1345  SpO2 97 % 01/12/20 1345  Vitals shown include unvalidated device data.  Last Pain:  Vitals:   01/12/20 1138  TempSrc: Oral  PainSc: 0-No pain      Patients Stated Pain Goal: 2 (88/[id number]/38 [id number])  Complications: No complications documented.

## 2020-01-12 NOTE — Op Note (Signed)
Operative Note  Preoperative diagnosis:  1.  4 mm right distal ureteral calculus  Postoperative diagnosis: 1.  4 mm right distal ureteral calculus  Procedure(s): 1.  Cystoscopy with right ureteroscopy, holmium laser lithotripsy and right JJ stent placement 2.  Right retrograde pyelogram with intraoperative interpretation fluoroscopic imaging  Surgeon: Ellison Hughs, MD  Assistants:  None  Anesthesia:  General  Complications:  None  EBL: Less than 5 mL  Specimens: 1.  Right ureteral stone fragments  Drains/Catheters: 1.  Right 6 French, 24 cm JJ stent without tether  Intraoperative findings:   1. Trilobar prostatic urethral obstruction 2. Moderate bladder trabeculation 3. Right retrograde pyelogram revealed a filling defect within the distal aspects of the right ureter, consistent with the obstructing stone seen on recent CT.  The remaining proximal aspects of the right ureter and renal pelvis were uniformly dilated with no other filling defects. 4. Obstructing 4 mm right distal ureteral calculus  Indication:  Anthony [name] is a 74 y.o. male with an obstructing 4 mm right distal ureteral calculus.  He has been consented for the above procedures, voices understanding and wishes to proceed.  Description of procedure:  After informed consent was obtained, the patient was brought to the operating room and general LMA anesthesia was administered. The patient was then placed in the dorsolithotomy position and prepped and draped in the usual sterile fashion. A timeout was performed. A 23 French rigid cystoscope was then inserted into the urethral meatus and advanced into the bladder under direct vision. A complete bladder survey revealed no intravesical pathology.  A 5 French ureteral catheter was then inserted into the right ureteral orifice and a retrograde pyelogram was obtained, with the findings listed above.  A Glidewire was then used to intubate the lumen of the  ureteral catheter and was advanced up to the right renal pelvis, under fluoroscopic guidance.  The catheter was then removed, leaving the wire in place.  A semirigid ureteroscope was then advanced into the bladder and alongside the right ureteral wire until his distal stone was identified.  A 200 m holmium laser was then used to fracture the stone into several smaller pieces.  A 0 tip basket was then used to extract all stone fragments from the lumen of the right ureter.  The semirigid ureteroscope was then exchanged for a rigid cystoscope, which was advanced over the wire and into the bladder.  A 6 French, 24 cm JJ stent was then placed over the wire and into good position within the right collecting system, confirming placement via fluoroscopy.  The patient's bladder was drained and all stone fragments were removed.  He tolerated the procedure well and was transferred to the postanesthesia in stable condition.  Plan: Follow-up in 1 week for office cystoscopy and stent removal

## 2020-01-12 NOTE — Anesthesia Postprocedure Evaluation (Signed)
Anesthesia Post Note  Patient: Anthony [name]  Procedure(s) Performed: CYSTOSCOPY/ RIGHT RETROGRADE/ RIGHT URETEROSCOPY/ RIGHT HOLMIUM LASER/ RIGHT STENT PLACEMENT (Right Ureter)     Patient location during evaluation: PACU Anesthesia Type: General Level of consciousness: awake and alert Pain management: pain level controlled Vital Signs Assessment: post-procedure vital signs reviewed and stable Respiratory status: spontaneous breathing, nonlabored ventilation, respiratory function stable and patient connected to nasal cannula oxygen Cardiovascular status: blood pressure returned to baseline and stable Postop Assessment: no apparent nausea or vomiting Anesthetic complications: no   No complications documented.  Last Vitals:  Vitals:   01/12/20 1344 01/12/20 1345  BP: 121/76 121/76  Pulse: (!) 111 (!) 110  Resp: 10 13  Temp: 36.7 C   SpO2: 96% 97%    Last Pain:  Vitals:   01/12/20 1344  TempSrc:   PainSc: 0-No pain                 Zhoe Catania COKER

## 2020-01-12 NOTE — Anesthesia Preprocedure Evaluation (Signed)
Anesthesia Evaluation  Patient identified by MRN, date of birth, ID band Patient awake    Reviewed: Allergy & Precautions, NPO status , Patient's Chart, lab work & pertinent test results  Airway Mallampati: III  TM Distance: >3 FB Neck ROM: Full    Dental  (+) Teeth Intact, Dental Advisory Given   Pulmonary former smoker,    breath sounds clear to auscultation       Cardiovascular hypertension,  Rhythm:Regular Rate:Normal     Neuro/Psych    GI/Hepatic   Endo/Other    Renal/GU      Musculoskeletal   Abdominal   Peds  Hematology   Anesthesia Other Findings   Reproductive/Obstetrics                             Anesthesia Physical Anesthesia Plan  ASA: III  Anesthesia Plan: General   Post-op Pain Management:    Induction: Intravenous  PONV Risk Score and Plan: Ondansetron and Dexamethasone  Airway Management Planned: LMA  Additional Equipment:   Intra-op Plan:   Post-operative Plan: Extubation in OR  Informed Consent: I have reviewed the patients History and Physical, chart, labs and discussed the procedure including the risks, benefits and alternatives for the proposed anesthesia with the patient or authorized representative who has indicated his/her understanding and acceptance.     Dental advisory given  Plan Discussed with: CRNA and Anesthesiologist  Anesthesia Plan Comments:         Anesthesia Quick Evaluation

## 2020-01-12 NOTE — H&P (Signed)
PRE-OP H&P  Office Visit Report     01/06/2020   --------------------------------------------------------------------------------   Anthony [name]  MRN: [id number]  DOB: 1945-03-27, 74 year old Male  PRIMARY CARE:  Penni Homans, MD  REFERRING:  Priscille Heidelberg. Little, MD  PROVIDER:  Alexis Frock, M.D.  TREATING:  Azucena Fallen, NP  LOCATION:  [facility] [phone]     --------------------------------------------------------------------------------   CC/HPI: CC: Kidney stones   HPI: Anthony [name] is a 74 year old male with a history of kidney stones, s/p left URS/laser/stent on 01/07/17 for a 7 mm left UPJ stone. (He also has a 4 mm right renal stone). The patient was previously followed by Dr. Tresa Moore and underwent left PCNL in July 2015 for a 2 cm left renal stone.   Last PSA- 1.7 (02/2018). No personal/family history of prostate cancer.   03/01/19: The patient is here today for a routine follow-up. He has done well over the past year. He denies interval episodes of flank pain, stone passage, dysuria or hematuria. He was chopping wood a few weeks ago and had an episode of left flank pain that has since resolved. He has no urinary complaints. Doing well.   01/06/20: Patient with above-noted history. He presents today with complaints of right flank pain. He states that symptoms have been ongoing for the past 4 days approximately. He continues to have right flank pain that will radiate to his right upper quadrant. He describes the pain as intermittent with episodes of pain that can be quite bothersome and severe. He has been using some ibuprofen on p.r.n. basis for pain management, which is minimally beneficial. He denies any associated nausea or vomiting. He denies any exacerbation and lower urinary tract symptoms, fever, chills, nausea, or vomiting. No complaints of gross hematuria. He denies any interval passage of stone material. He states that pain today feel similar to past  stone events.     ALLERGIES: No Allergies    MEDICATIONS: Potassium Citrate Er 10 meq (1,080 mg) tablet, extended release 1 tablet PO BID  Amlodipine Besylate 10 mg tablet Oral  Aspirin Ec 81 mg tablet, delayed release Oral  Crestor 40 mg tablet Oral  Eye Vitamins  Losartan Potassium 100 mg tablet Oral  Metoprolol Tartrate 25 MG Oral Tablet Oral  Vitamin C 500 mg tablet Oral     GU PSH: Create Passage To Kidney - 2015 Cysto Bladder Stone <2.5cm - 2018, 2017 Cystoscopy Retrogrades - 2015 Percut Stone Removal >2cm - 2015 Ureteroscopic laser litho, Left - 2018       PSH Notes: Cystoscopy With Fragmentation Of Bladder Calculus, Cystoscopy With Ureteral Catheterization, Percutaneous Creation Of Nephrostomy Tract, Percutaneous Lithotomy For Stone Over 2cm., Cath Stent Placement   NON-GU PSH: None   GU PMH: History of urolithiasis - 02/26/2018 Ureteral calculus, 6 mm left UPJ - 2018 Hypocitraturia, Hypocitraturia - 2017 Renal calculus, Nephrolithiasis - 2017 Urinary Tract Inf, Unspec site, Recurrent UTI - 2017 Encounter for Prostate Cancer screening, Prostate cancer screening - 2017 Gross hematuria, Gross hematuria - 2016 BPH w/o LUTS, Benign enlargement of prostate - 2015    NON-GU PMH: Oliguria, Oliguria - 2017 Encounter for general adult medical examination without abnormal findings, Encounter for preventive health examination - 2017 Myocardial Infarction, History of myocardial infarction - 2015 Personal history of other diseases of the circulatory system, History of hypertension - 2015 Personal history of other diseases of the musculoskeletal system and connective tissue, History of arthritis - 2015 Personal history of  other endocrine, nutritional and metabolic disease, History of hyperlipidemia - 2015 Personal history of other specified conditions, History of heartburn - 2015    FAMILY HISTORY: Cardiac Failure - Father Death - Father Family Health Status Number -  Father Mother Deceased At Age 50 from diabetic complicati - Runs In Family   SOCIAL HISTORY: Marital Status: Married Preferred Language: English; Ethnicity: Not Hispanic Or Latino; Race: White Current Smoking Status: Patient does not smoke anymore.   Tobacco Use Assessment Completed: Used Tobacco in last 30 days? Social Drinker.  Drinks 3 caffeinated drinks per day. Patient's occupation is/was Retired.    REVIEW OF SYSTEMS:    GU Review Male:   Patient denies frequent urination, hard to postpone urination, burning/ pain with urination, get up at night to urinate, leakage of urine, stream starts and stops, trouble starting your stream, have to strain to urinate , erection problems, and penile pain.  Gastrointestinal (Upper):   Patient denies nausea, vomiting, and indigestion/ heartburn.  Gastrointestinal (Lower):   Patient denies diarrhea and constipation.  Constitutional:   Patient denies fever, night sweats, weight loss, and fatigue.  Skin:   Patient denies skin rash/ lesion and itching.  Eyes:   Patient denies blurred vision and double vision.  Ears/ Nose/ Throat:   Patient denies sore throat and sinus problems.  Hematologic/Lymphatic:   Patient denies swollen glands and easy bruising.  Cardiovascular:   Patient denies leg swelling and chest pains.  Respiratory:   Patient denies cough and shortness of breath.  Endocrine:   Patient denies excessive thirst.  Musculoskeletal:   Patient reports back pain. Patient denies joint pain.  Neurological:   Patient denies headaches and dizziness.  Psychologic:   Patient denies depression and anxiety.   Notes: flank pain 3/10.    VITAL SIGNS:      01/06/2020 03:38 PM  Weight 170 lb / 77.11 kg  Height 66 in / 167.64 cm  BP 171/80 mmHg  Pulse 87 /min  Temperature 98.3 F / 36.8 C  BMI 27.4 kg/m   MULTI-SYSTEM PHYSICAL EXAMINATION:    Constitutional: Well-nourished. No physical deformities. Normally developed. Good grooming.  Respiratory:  Normal breath sounds. No labored breathing, no use of accessory muscles.   Cardiovascular: Regular rate and rhythm. No murmur, no gallop. Normal temperature, normal extremity pulses, no swelling, no varicosities.   Neurologic / Psychiatric: Oriented to time, oriented to place, oriented to person. No depression, no anxiety, no agitation.  Gastrointestinal: No mass, no tenderness, no rigidity, non obese abdomen. No CVAT.   Musculoskeletal: Normal gait and station of head and neck.     Complexity of Data:  Records Review:   Previous Patient Records  Urine Test Review:   Urinalysis, Urine Culture  Urodynamics Review:   Review Bladder Scan   02/19/18  PSA  Total PSA 1.70 ng/mL    PROCEDURES:         C.T. Urogram - [id number]   IMPRESSION:  1. Right-sided hydronephrosis and hydroureter secondary to 0.5 x 0.4  cm distal right ureteral calculus.  2. Prostate gland enlargement.  3. Hepatic steatosis.  4. Small umbilical hernia contains a nonobstructed loop of small  bowel.  5. Fat containing left inguinal hernia.  6. Bilateral L5 pars defects.  7. Aortic atherosclerosis.        Patient confirmed No Neulasta OnPro Device.         Urinalysis w/Scope - 81001 Dipstick Dipstick Cont'd Micro  Color: Yellow Bilirubin: Neg WBC/hpf: NS (Not Seen)  Appearance: Clear Ketones: Neg RBC/hpf: 3 - 10/hpf  Specific Gravity: 1.015 Blood: 1+ Bacteria: Many (>50/hpf)  pH: 7.5 Protein: Neg Cystals: Amorph Phosphates  Glucose: Neg Urobilinogen: 0.2 Casts: NS (Not Seen)    Nitrites: Neg Trichomonas: Not Present    Leukocyte Esterase: Neg Mucous: Not Present      Epithelial Cells: NS (Not Seen)      Yeast: NS (Not Seen)      Sperm: Not Present    Notes:      ASSESSMENT:      ICD-10 Details  1 GU:   Renal calculus - N20.0   2   Flank Pain - R10.84   3   Microscopic hematuria - R31.21    PLAN:            Medications New Meds: Tamsulosin Hcl 0.4 mg capsule 1 capsule PO Daily   #30  0 Refill(s)   Cephalexin 500 mg capsule 1 capsule PO BID   #14  0 Refill(s)  Tramadol Hcl 50 mg tablet 1 tablet PO Q 6 H PRN   #20  0 Refill(s)  Zofran 4 mg tablet 1 tablet PO Q 8 H PRN For nausea  #20  0 Refill(s)    Stop Meds: Tramadol Hcl 50 mg tablet 1 tablet PO Q 6 H PRN  Start: 01/06/2020  Discontinue: 01/06/2020  - Reason: The medication was entered incorrectly.  Tramadol Hcl 50 mg tablet 1 tablet PO Q 6 H PRN  Start: 01/06/2020  Discontinue: 01/06/2020  - Reason: The medication was entered incorrectly.            Orders Labs Urine Culture  X-Rays: C.T. Stone Protocol Without Contrast  X-Ray Notes: History:  Hematuria: Yes/No  Patient to see MD after exam: Yes/No  Previous exam: CT / IVP/ US/ KUB/ None  When:  Where:  Diabetic: Yes/ No  BUN/ Creatinine:  Date of last BUN Creatinine:  Weight in pounds:  Allergy- IV Contrast: Yes/ No  Conflicting diabetic meds: Yes/ No  Diabetic Meds:  Prior Authorization #:  [id number]          Schedule         Document Letter(s):  Created for Patient: Clinical Summary   A/P: 74 year old male with a 4 mm right ureteral calculus associated with right-sided hydronephrosis and renal colic  The risks, benefits and alternatives of cystoscopy with RIGHT ureteroscopy, laser lithotripsy and ureteral stent placement was discussed the patient.  Risks included, but are not limited to: bleeding, urinary tract infection, ureteral injury/avulsion, ureteral stricture formation, retained stone fragments, the possibility that multiple surgeries may be required to treat the stone(s), MI, stroke, PE and the inherent risks of general anesthesia.  The patient voices understanding and wishes to proceed.

## 2020-01-12 NOTE — Anesthesia Procedure Notes (Signed)
Procedure Name: LMA Insertion Date/Time: 01/12/2020 1:10 PM Performed by: Rogers Blocker, CRNA Pre-anesthesia Checklist: Patient identified, Emergency Drugs available, Suction available and Patient being monitored Patient Re-evaluated:Patient Re-evaluated prior to induction Oxygen Delivery Method: Circle System Utilized Preoxygenation: Pre-oxygenation with 100% oxygen Induction Type: IV induction Ventilation: Mask ventilation without difficulty LMA: LMA inserted LMA Size: 5.0 Number of attempts: 1 Placement Confirmation: positive ETCO2 Tube secured with: Tape Dental Injury: Teeth and Oropharynx as per pre-operative assessment

## 2020-01-12 NOTE — Discharge Instructions (Signed)

## 2020-01-13 ENCOUNTER — Encounter (HOSPITAL_BASED_OUTPATIENT_CLINIC_OR_DEPARTMENT_OTHER): Payer: Self-pay | Admitting: Urology

## 2020-01-16 ENCOUNTER — Other Ambulatory Visit: Payer: Self-pay | Admitting: Cardiology

## 2020-01-16 DIAGNOSIS — I2510 Atherosclerotic heart disease of native coronary artery without angina pectoris: Secondary | ICD-10-CM

## 2020-01-18 DIAGNOSIS — H353131 Nonexudative age-related macular degeneration, bilateral, early dry stage: Secondary | ICD-10-CM | POA: Diagnosis not present

## 2020-01-18 DIAGNOSIS — H35371 Puckering of macula, right eye: Secondary | ICD-10-CM | POA: Diagnosis not present

## 2020-01-18 DIAGNOSIS — H35721 Serous detachment of retinal pigment epithelium, right eye: Secondary | ICD-10-CM | POA: Diagnosis not present

## 2020-01-18 DIAGNOSIS — Z961 Presence of intraocular lens: Secondary | ICD-10-CM | POA: Diagnosis not present

## 2020-01-19 DIAGNOSIS — N200 Calculus of kidney: Secondary | ICD-10-CM | POA: Diagnosis not present

## 2020-01-28 ENCOUNTER — Telehealth: Payer: Self-pay

## 2020-01-28 NOTE — Telephone Encounter (Signed)
Patient calling to see what the refill request call was for - he saw it in [place] and was unsure. Let patient know the medications that were refilled.

## 2020-02-11 ENCOUNTER — Ambulatory Visit: Payer: Medicare Other | Attending: Internal Medicine

## 2020-02-11 DIAGNOSIS — Z23 Encounter for immunization: Secondary | ICD-10-CM

## 2020-02-11 NOTE — Progress Notes (Signed)
   Covid-19 Vaccination Clinic  Name:  Anthony [name]    MRN: [id number] DOB: 06/11/1945  02/11/2020  Anthony [name] was observed post Covid-19 immunization for 15 minutes without incident. He was provided with Vaccine Information Sheet and instruction to access the V-Safe system.   Anthony [name] was instructed to call 911 with any severe reactions post vaccine: Marland Kitchen Difficulty breathing  . Swelling of face and throat  . A fast heartbeat  . A bad rash all over body  . Dizziness and weakness   Immunizations Administered    Name Date Dose VIS Date Route   Pfizer COVID-19 Vaccine 02/11/2020  1:28 PM 0.3 mL 12/29/2019 Intramuscular   Manufacturer: [place]   Lot: [id number]   NDC: [address]-3

## 2020-02-17 DIAGNOSIS — M5416 Radiculopathy, lumbar region: Secondary | ICD-10-CM | POA: Diagnosis not present

## 2020-02-21 DIAGNOSIS — N200 Calculus of kidney: Secondary | ICD-10-CM | POA: Diagnosis not present

## 2020-02-22 DIAGNOSIS — L720 Epidermal cyst: Secondary | ICD-10-CM | POA: Diagnosis not present

## 2020-02-28 ENCOUNTER — Other Ambulatory Visit: Payer: Self-pay | Admitting: Family Medicine

## 2020-02-28 DIAGNOSIS — I10 Essential (primary) hypertension: Secondary | ICD-10-CM

## 2020-02-28 DIAGNOSIS — N201 Calculus of ureter: Secondary | ICD-10-CM | POA: Diagnosis not present

## 2020-02-28 DIAGNOSIS — N400 Enlarged prostate without lower urinary tract symptoms: Secondary | ICD-10-CM | POA: Diagnosis not present

## 2020-03-15 NOTE — Progress Notes (Signed)
HPI: FU CAD; admitted in March 2011 with a non-ST elevation myocardial infarction. He underwent cardiac catheterization which revealed - Left main was normal. The LAD had proximal calcification. There was long proximal 30% stenosis. Circumflex in the AV groove had proximal 60% stenosis at a mid obtuse marginal. This first large mid obtuse marginal had ostial 70% stenosis. The right coronary artery had a proximal long 50% stenosis. There was mid subtotal stenosis. The EF was 50% with inferior hypokinesis. Patient had a drug-eluting stent to the right coronary artery at that time. Nuclear study in December of 2013 showed an ejection fraction of 71% and normal perfusion. Carotid Dopplers in Jan 2017 showed 1-39 bilateral stenosis. Abdominal ultrasound January 2020 showed no aneurysm. Venous Dopplers right lower extremity September 2021 showed no DVT.  Since I last saw him,the patient denies any dyspnea on exertion, orthopnea, PND, pedal edema, palpitations, syncope or chest pain.   Current Outpatient Medications  Medication Sig Dispense Refill  . amLODipine (NORVASC) 10 MG tablet Take 1 tablet (10 mg total) by mouth every morning. 90 tablet 0  . ascorbic acid (VITAMIN C) 500 MG tablet Take 500 mg by mouth daily.    Marland Kitchen aspirin 81 MG tablet Take 81 mg by mouth every morning.    . gabapentin (NEURONTIN) 300 MG capsule Take 300 mg by mouth 2 (two) times daily. One capsule at lunch and two capsule at bedtime    . losartan (COZAAR) 100 MG tablet Take 1 tablet (100 mg total) by mouth every morning. 90 tablet 0  . metoprolol tartrate (LOPRESSOR) 25 MG tablet TAKE ONE-HALF TABLET BY  MOUTH TWICE DAILY 90 tablet 3  . Multiple Vitamins-Minerals (ICAPS PO) Take by mouth every morning.     . potassium citrate (UROCIT-K) 10 MEQ (1080 MG) SR tablet Take 10 mEq by mouth in the morning and at bedtime.   3  . rosuvastatin (CRESTOR) 40 MG tablet TAKE 1 TABLET BY MOUTH  DAILY 90 tablet 3   No current  facility-administered medications for this visit.     Past Medical History:  Diagnosis Date  . Allergic rhinitis   . BPH (benign prostatic hyperplasia)   . CAD (coronary artery disease) 05/26/2009 :  primary cardiolgoist-  dr Stanford Breed   hx NSTEMI cardiac catherization  revealed-left main normal; LAD proximal calcification,  long proximal 30% stenosis,  Circumflex  AV groove   proximal  60% stenosis at mid obtuse marginal,  first large mid obtuse marginal  ostial 70% stenosis,  prox. to mid RCA  99% stenosis w/ intervention PCI and DES  . Carotid stenosis, asymptomatic, bilateral    per last duplex 03-31-2015  bilateral ICA 1-39%  . Cataract    removed  . External hemorrhoids    large external  . GERD (gastroesophageal reflux disease)   . History of adenomatous polyp of colon   . History of kidney stones   . History of non-ST elevation myocardial infarction (NSTEMI) 05/26/2009  . Hyperlipidemia   . Hypertension    8-10 years  . Left ureteral stone   . Macular degeneration   . Myocardial infarction ([place])   . Nephrolithiasis    right side non-obstructive per CT 01-03-2017  . S/P drug eluting coronary stent placement 05/26/2009   DESx1  to pRCA  . Wears glasses     Past Surgical History:  Procedure Laterality Date  . CARDIOVASCULAR STRESS TEST  12/26/2011   dr Stanford Breed   normal nuclear study w/  no ischemia/  normal LV function and wall motion , ef 71%  . CATARACT EXTRACTION W/ INTRAOCULAR LENS  IMPLANT, BILATERAL  2010  . COLONOSCOPY  last one 06-10-2016  . CORONARY ANGIOPLASTY WITH STENT PLACEMENT  05-26-2009  dr hochrein/ dr Darnell Level brodie   Severe RCA stenosis and moderate stenosis in the left system, ef 50% with inferior hypokinsis:  PCI and DES x1 to RCA ([facility])  . CYSTOSCOPY W/ RETROGRADES Bilateral 09/29/2013   Procedure: CYSTOSCOPY WITH RETROGRADE PYELOGRAM;  Surgeon: Alexis Frock, MD;  Location: [facility];  Service: Urology;  Laterality: Bilateral;  . CYSTOSCOPY WITH  RETROGRADE PYELOGRAM, URETEROSCOPY AND STENT PLACEMENT N/A 06/14/2015   Procedure: CYSTOSCOPY WITH RETROGRADE PYELOGRAM, cysto litholapexy ;  Surgeon: Alexis Frock, MD;  Location: [facility];  Service: Urology;  Laterality: N/A;  . CYSTOSCOPY/URETEROSCOPY/HOLMIUM LASER/STENT PLACEMENT Left 01/07/2017   Procedure: CYSTOSCOPY/RETROGRADE/URETEROSCOPY/HOLMIUM LASER/STENT PLACEMENT, FULGERATION OF PROSTATIC URETHRA;  Surgeon: Ceasar Mons, MD;  Location: [facility];  Service: Urology;  Laterality: Left;  . CYSTOSCOPY/URETEROSCOPY/HOLMIUM LASER/STENT PLACEMENT Right 01/12/2020   Procedure: CYSTOSCOPY/ RIGHT RETROGRADE/ RIGHT URETEROSCOPY/ RIGHT HOLMIUM LASER/ RIGHT STENT PLACEMENT;  Surgeon: Ceasar Mons, MD;  Location: [facility];  Service: Urology;  Laterality: Right;  ONLY NEEDS 60 MIN  . HOLMIUM LASER APPLICATION N/A [id number]   Procedure: HOLMIUM LASER APPLICATION bladder stone;  Surgeon: Alexis Frock, MD;  Location: [facility];  Service: Urology;  Laterality: N/A;  . HOLMIUM LASER APPLICATION Left [id number]   Procedure: HOLMIUM LASER APPLICATION;  Surgeon: Ceasar Mons, MD;  Location: [facility];  Service: Urology;  Laterality: Left;  . NEPHROLITHOTOMY Left 09/29/2013   Procedure: NEPHROLITHOTOMY PERCUTANEOUS WITH ACCESS;  Surgeon: Alexis Frock, MD;  Location: [facility];  Service: Urology;  Laterality: Left;  . URETEROSCOPY N/A 09/29/2013   Procedure: URETEROSCOPY;  Surgeon: Alexis Frock, MD;  Location: [facility];  Service: Urology;  Laterality: N/A;    Social History   Socioeconomic History  . Marital status: Married    Spouse name: Not on file  . Number of children: Not on file  . Years of education: Not on file  . Highest education level: Not on file  Occupational History  . Not on file  Tobacco Use  . Smoking status: Former Smoker    Years: 45.00    Types: Cigarettes     Quit date: 03/12/2007    Years since quitting: 13.0  . Smokeless tobacco: Never Used  Substance and Sexual Activity  . Alcohol use: No  . Drug use: No  . 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 Not on file  Social History Narrative   married -34 yrs  [name] Carpenter)   grew up in [place]   He has one son - two grandaugthers     He is retired.  He had     a 40 pack-year smoking history but has discontinued.     prev occuptation -  [employer]   Alcohol use-no   Smoking Status:  quit   Packs/Day:  0.5   Caffeine use/day:  2 beverages daily   Does Patient Exercise:  yes            Social Determinants of Health   Financial Resource Strain: Low Risk   . Difficulty of Paying Living Expenses: Not hard at all  Food Insecurity: Not on file  Transportation  Needs: No Transportation Needs  . Lack of Transportation (Medical): No  . Lack of Transportation (Non-Medical): No  Physical Activity: Not on file  Stress: Not on file  Social Connections: Not on file  Intimate Partner Violence: Not on file    Family History  Problem Relation Age of Onset  . Coronary artery disease Father        in his 59's  . Hyperlipidemia Father   . Hypertension Father   . Hearing loss Father   . Heart disease Mother        deceased of heart disease  . Hypertension Mother   . Hyperlipidemia Mother   . Multiple sclerosis Son   . Heart disease Maternal Grandmother   . Heart disease Maternal Grandfather   . Heart disease Paternal Grandmother   . Heart disease Paternal Grandfather   . GER disease Sister   . Other Neg Hx        no lung cancer, colon cancer, or prostate  . Colon cancer Neg Hx   . Stomach cancer Neg Hx   . Esophageal cancer Neg Hx     ROS: back pain but no fevers or chills, productive cough, hemoptysis, dysphasia, odynophagia, melena, hematochezia, dysuria, hematuria, rash, seizure activity,  orthopnea, PND, pedal edema, claudication. Remaining systems are negative.  Physical Exam: Well-developed well-nourished in no acute distress.  Skin is warm and dry.  HEENT is normal.  Neck is supple.  Chest is clear to auscultation with normal expansion.  Cardiovascular exam is regular rate and rhythm.  Abdominal exam nontender or distended. No masses palpated. Extremities show no edema. neuro grossly intact  A/P  1 coronary artery disease-patient denies chest pain.  Continue medical therapy with aspirin and statin.  2 hypertension-blood pressure controlled.  Continue present medications and follow.  3 hyperlipidemia-continue statin.  4 carotid artery disease-mild on most recent Dopplers.  Olga Millers, MD

## 2020-03-29 ENCOUNTER — Ambulatory Visit (INDEPENDENT_AMBULATORY_CARE_PROVIDER_SITE_OTHER): Payer: Medicare Other | Admitting: Cardiology

## 2020-03-29 ENCOUNTER — Other Ambulatory Visit: Payer: Self-pay

## 2020-03-29 ENCOUNTER — Encounter: Payer: Self-pay | Admitting: Cardiology

## 2020-03-29 VITALS — BP 148/56 | HR 86 | Ht 66.0 in | Wt 176.8 lb

## 2020-03-29 DIAGNOSIS — I10 Essential (primary) hypertension: Secondary | ICD-10-CM

## 2020-03-29 DIAGNOSIS — E7800 Pure hypercholesterolemia, unspecified: Secondary | ICD-10-CM

## 2020-03-29 DIAGNOSIS — I2510 Atherosclerotic heart disease of native coronary artery without angina pectoris: Secondary | ICD-10-CM

## 2020-03-29 DIAGNOSIS — I679 Cerebrovascular disease, unspecified: Secondary | ICD-10-CM | POA: Diagnosis not present

## 2020-03-29 NOTE — Patient Instructions (Signed)
Medication Instructions:  Your physician recommends that you continue on your current medications as directed. Please refer to the Current Medication list given to you today.  *If you need a refill on your cardiac medications before your next appointment, please call your pharmacy*  Lab Work: NONE ordered at this time of appointment   If you have labs (blood work) drawn today and your tests are completely normal, you will receive your results only by: Marland Kitchen MyChart Message (if you have MyChart) OR . A paper copy in the mail If you have any lab test that is abnormal or we need to change your treatment, we will call you to review the results.  Testing/Procedures: NONE ordered at this time of appointment   Follow-Up: At [facility], you and your health needs are our priority.  As part of our continuing mission to provide you with exceptional heart care, we have created designated Provider Care Teams.  These Care Teams include your primary Cardiologist (physician) and Advanced Practice Providers (APPs -  Physician Assistants and Nurse Practitioners) who all work together to provide you with the care you need, when you need it.    Your next appointment:   12 month(s)  The format for your next appointment:   In Person  Provider:   Phill Mutter, MD  Other Instructions

## 2020-04-30 ENCOUNTER — Other Ambulatory Visit: Payer: Self-pay | Admitting: Family Medicine

## 2020-04-30 DIAGNOSIS — I10 Essential (primary) hypertension: Secondary | ICD-10-CM

## 2020-05-04 ENCOUNTER — Other Ambulatory Visit: Payer: Self-pay

## 2020-05-04 ENCOUNTER — Encounter: Payer: Self-pay | Admitting: Family Medicine

## 2020-05-04 ENCOUNTER — Ambulatory Visit (INDEPENDENT_AMBULATORY_CARE_PROVIDER_SITE_OTHER): Payer: Medicare Other | Admitting: Family Medicine

## 2020-05-04 VITALS — BP 138/68 | HR 66 | Temp 97.7°F | Resp 16 | Ht 66.0 in | Wt 180.0 lb

## 2020-05-04 DIAGNOSIS — M5136 Other intervertebral disc degeneration, lumbar region: Secondary | ICD-10-CM | POA: Diagnosis not present

## 2020-05-04 DIAGNOSIS — R739 Hyperglycemia, unspecified: Secondary | ICD-10-CM | POA: Diagnosis not present

## 2020-05-04 DIAGNOSIS — I10 Essential (primary) hypertension: Secondary | ICD-10-CM | POA: Diagnosis not present

## 2020-05-04 DIAGNOSIS — I2510 Atherosclerotic heart disease of native coronary artery without angina pectoris: Secondary | ICD-10-CM | POA: Diagnosis not present

## 2020-05-04 DIAGNOSIS — E782 Mixed hyperlipidemia: Secondary | ICD-10-CM | POA: Diagnosis not present

## 2020-05-04 DIAGNOSIS — H6123 Impacted cerumen, bilateral: Secondary | ICD-10-CM

## 2020-05-04 DIAGNOSIS — M51369 Other intervertebral disc degeneration, lumbar region without mention of lumbar back pain or lower extremity pain: Secondary | ICD-10-CM

## 2020-05-04 LAB — CBC WITH DIFFERENTIAL/PLATELET
Basophils Absolute: 0 10*3/uL (ref 0.0–0.1)
Basophils Relative: 0.5 % (ref 0.0–3.0)
Eosinophils Absolute: 0.1 10*3/uL (ref 0.0–0.7)
Eosinophils Relative: 1.4 % (ref 0.0–5.0)
HCT: 42.8 % (ref 39.0–52.0)
Hemoglobin: 14.6 g/dL (ref 13.0–17.0)
Lymphocytes Relative: 35.2 % (ref 12.0–46.0)
Lymphs Abs: 2.7 10*3/uL (ref 0.7–4.0)
MCHC: 34.2 g/dL (ref 30.0–36.0)
MCV: 93.4 fl (ref 78.0–100.0)
Monocytes Absolute: 0.9 10*3/uL (ref 0.1–1.0)
Monocytes Relative: 11.5 % (ref 3.0–12.0)
Neutro Abs: 4 10*3/uL (ref 1.4–7.7)
Neutrophils Relative %: 51.4 % (ref 43.0–77.0)
Platelets: 219 10*3/uL (ref 150.0–400.0)
RBC: 4.58 Mil/uL (ref 4.22–5.81)
RDW: 12.8 % (ref 11.5–15.5)
WBC: 7.8 10*3/uL (ref 4.0–10.5)

## 2020-05-04 LAB — COMPREHENSIVE METABOLIC PANEL
ALT: 20 U/L (ref 0–53)
AST: 15 U/L (ref 0–37)
Albumin: 4.3 g/dL (ref 3.5–5.2)
Alkaline Phosphatase: 70 U/L (ref 39–117)
BUN: 12 mg/dL (ref 6–23)
CO2: 30 mEq/L (ref 19–32)
Calcium: 9 mg/dL (ref 8.4–10.5)
Chloride: 103 mEq/L (ref 96–112)
Creatinine, Ser: 0.89 mg/dL (ref 0.40–1.50)
GFR: 84.21 mL/min (ref >60.00)
Glucose, Bld: 94 mg/dL (ref 70–99)
Potassium: 4 mEq/L (ref 3.5–5.1)
Sodium: 140 mEq/L (ref 135–145)
Total Bilirubin: 0.7 mg/dL (ref 0.2–1.2)
Total Protein: 6.9 g/dL (ref 6.0–8.3)

## 2020-05-04 LAB — LIPID PANEL
Cholesterol: 101 mg/dL (ref 0–200)
HDL: 37.6 mg/dL — ABNORMAL LOW (ref >39.00)
LDL Cholesterol: 25 mg/dL (ref 0–99)
NonHDL: 63.08
Total CHOL/HDL Ratio: 3
Triglycerides: 189 mg/dL — ABNORMAL HIGH (ref 0.0–149.0)
VLDL: 37.8 mg/dL (ref 0.0–40.0)

## 2020-05-04 LAB — TSH: TSH: 3.4 u[IU]/mL (ref 0.35–4.50)

## 2020-05-04 LAB — HEMOGLOBIN A1C: Hgb A1c MFr Bld: 6.2 % (ref 4.6–6.5)

## 2020-05-04 NOTE — Assessment & Plan Note (Signed)
Tolerating statin, encouraged heart healthy diet, avoid trans fats, minimize simple carbs and saturated fats. Increase exercise as tolerated 

## 2020-05-04 NOTE — Assessment & Plan Note (Addendum)
In September was chopping wood and developed right thigh pain he went to [facility] and they found a bulging disc fortunately with PT he has improved and will continue to follow with ortho

## 2020-05-04 NOTE — Assessment & Plan Note (Signed)
hgba1c acceptable, minimize simple carbs. Increase exercise as tolerated.  

## 2020-05-04 NOTE — Patient Instructions (Addendum)
Tdap ie tetanus  Peroxide 3-5 drops in each ear after shower for roughly 5 night s then return for flushing  Preventive Care 65 Years and Older, Male Preventive care refers to lifestyle choices and visits with your health care provider that can promote health and wellness. This includes:  A yearly physical exam. This is also called an annual wellness visit.  Regular dental and eye exams.  Immunizations.  Screening for certain conditions.  Healthy lifestyle choices, such as: ? Eating a healthy diet. ? Getting regular exercise. ? Not using drugs or products that contain nicotine and tobacco. ? Limiting alcohol use. What can I expect for my preventive care visit? Physical exam Your health care provider will check your:  Height and weight. These may be used to calculate your BMI (body mass index). BMI is a measurement that tells if you are at a healthy weight.  Heart rate and blood pressure.  Body temperature.  Skin for abnormal spots. Counseling Your health care provider may ask you questions about your:  Past medical problems.  Family's medical history.  Alcohol, tobacco, and drug use.  Emotional well-being.  Home life and relationship well-being.  Sexual activity.  Diet, exercise, and sleep habits.  History of falls.  Memory and ability to understand (cognition).  Work and work Statistician.  Access to firearms. What immunizations do I need? Vaccines are usually given at various ages, according to a schedule. Your health care provider will recommend vaccines for you based on your age, medical history, and lifestyle or other factors, such as travel or where you work.   What tests do I need? Blood tests  Lipid and cholesterol levels. These may be checked every 5 years, or more often depending on your overall health.  Hepatitis C test.  Hepatitis B test. Screening  Lung cancer screening. You may have this screening every year starting at age 27 if you  have a 30-pack-year history of smoking and currently smoke or have quit within the past 15 years.  Colorectal cancer screening. ? All adults should have this screening starting at age 22 and continuing until age 43. ? Your health care provider may recommend screening at age 37 if you are at increased risk. ? You will have tests every 1-10 years, depending on your results and the type of screening test.  Prostate cancer screening. Recommendations will vary depending on your family history and other risks.  Genital exam to check for testicular cancer or hernias.  Diabetes screening. ? This is done by checking your blood sugar (glucose) after you have not eaten for a while (fasting). ? You may have this done every 1-3 years.  Abdominal aortic aneurysm (AAA) screening. You may need this if you are a current or former smoker.  STD (sexually transmitted disease) testing, if you are at risk. Follow these instructions at home: Eating and drinking  Eat a diet that includes fresh fruits and vegetables, whole grains, lean protein, and low-fat dairy products. Limit your intake of foods with high amounts of sugar, saturated fats, and salt.  Take vitamin and mineral supplements as recommended by your health care provider.  Do not drink alcohol if your health care provider tells you not to drink.  If you drink alcohol: ? Limit how much you have to 0-2 drinks a day. ? Be aware of how much alcohol is in your drink. In the [place] one drink equals one 12 oz bottle of beer (355 mL), one 5 oz glass of wine (  148 mL), or one 1 oz glass of hard liquor (44 mL).   Lifestyle  Take daily care of your teeth and gums. Brush your teeth every morning and night with fluoride toothpaste. Floss one time each day.  Stay active. Exercise for at least 30 minutes 5 or more days each week.  Do not use any products that contain nicotine or tobacco, such as cigarettes, e-cigarettes, and chewing tobacco. If you need help  quitting, ask your health care provider.  Do not use drugs.  If you are sexually active, practice safe sex. Use a condom or other form of protection to prevent STIs (sexually transmitted infections).  Talk with your health care provider about taking a low-dose aspirin or statin.  Find healthy ways to cope with stress, such as: ? Meditation, yoga, or listening to music. ? Journaling. ? Talking to a trusted person. ? Spending time with friends and family. Safety  Always wear your seat belt while driving or riding in a vehicle.  Do not drive: ? If you have been drinking alcohol. Do not ride with someone who has been drinking. ? When you are tired or distracted. ? While texting.  Wear a helmet and other protective equipment during sports activities.  If you have firearms in your house, make sure you follow all gun safety procedures. What's next?  Visit your health care provider once a year for an annual wellness visit.  Ask your health care provider how often you should have your eyes and teeth checked.  Stay up to date on all vaccines. This information is not intended to replace advice given to you by your health care provider. Make sure you discuss any questions you have with your health care provider. Document Revised: 11/24/2018 Document Reviewed: 02/19/2018 Elsevier Patient Education  2021 [employer].

## 2020-05-04 NOTE — Assessment & Plan Note (Signed)
Well controlled, no changes to meds. Encouraged heart healthy diet such as the DASH diet and exercise as tolerated.  °

## 2020-05-07 NOTE — Progress Notes (Signed)
Subjective:    Patient ID: Anthony [name], male    DOB: [birth year], 75 y.o.   MRN: [id number]  Chief Complaint  Patient presents with  . Annual Exam  . Hypertension  . Hyperlipidemia    HPI Patient is in today for regular care and follow up on chronic medical concerns. No recent febrile illness or hospitalizations. He has been having a great deal of trouble with his low back and notes radicular pain into his right leg, especially his thigh but also his inner right calf. He saw ortho and after physical therapy he is much better. He is also noting his ears feel stopped up and this can be uncomfortable. Denies CP/palp/SOB/HA/congestion/fevers/GI or GU c/o. Taking meds as prescribed  Past Medical History:  Diagnosis Date  . Allergic rhinitis   . BPH (benign prostatic hyperplasia)   . CAD (coronary artery disease) 05/26/2009 :  primary cardiolgoist-  dr Stanford Breed   hx NSTEMI cardiac catherization  revealed-left main normal; LAD proximal calcification,  long proximal 30% stenosis,  Circumflex  AV groove   proximal  60% stenosis at mid obtuse marginal,  first large mid obtuse marginal  ostial 70% stenosis,  prox. to mid RCA  99% stenosis w/ intervention PCI and DES  . Carotid stenosis, asymptomatic, bilateral    per last duplex 03-31-2015  bilateral ICA 1-39%  . Cataract    removed  . External hemorrhoids    large external  . GERD (gastroesophageal reflux disease)   . History of adenomatous polyp of colon   . History of kidney stones   . History of non-ST elevation myocardial infarction (NSTEMI) 05/26/2009  . Hyperlipidemia   . Hypertension    8-10 years  . Left ureteral stone   . Macular degeneration   . Myocardial infarction ([place])   . Nephrolithiasis    right side non-obstructive per CT 01-03-2017  . S/P drug eluting coronary stent placement 05/26/2009   DESx1  to pRCA  . Wears glasses     Past Surgical History:  Procedure Laterality Date  . CARDIOVASCULAR STRESS TEST   12/26/2011   dr Stanford Breed   normal nuclear study w/ no ischemia/  normal LV function and wall motion , ef 71%  . CATARACT EXTRACTION W/ INTRAOCULAR LENS  IMPLANT, BILATERAL  2010  . COLONOSCOPY  last one 06-10-2016  . CORONARY ANGIOPLASTY WITH STENT PLACEMENT  05-26-2009  dr hochrein/ dr Darnell Level brodie   Severe RCA stenosis and moderate stenosis in the left system, ef 50% with inferior hypokinsis:  PCI and DES x1 to RCA ([facility])  . CYSTOSCOPY W/ RETROGRADES Bilateral 09/29/2013   Procedure: CYSTOSCOPY WITH RETROGRADE PYELOGRAM;  Surgeon: Alexis Frock, MD;  Location: [facility];  Service: Urology;  Laterality: Bilateral;  . CYSTOSCOPY WITH RETROGRADE PYELOGRAM, URETEROSCOPY AND STENT PLACEMENT N/A 06/14/2015   Procedure: CYSTOSCOPY WITH RETROGRADE PYELOGRAM, cysto litholapexy ;  Surgeon: Alexis Frock, MD;  Location: [facility];  Service: Urology;  Laterality: N/A;  . CYSTOSCOPY/URETEROSCOPY/HOLMIUM LASER/STENT PLACEMENT Left 01/07/2017   Procedure: CYSTOSCOPY/RETROGRADE/URETEROSCOPY/HOLMIUM LASER/STENT PLACEMENT, FULGERATION OF PROSTATIC URETHRA;  Surgeon: Ceasar Mons, MD;  Location: [facility];  Service: Urology;  Laterality: Left;  . CYSTOSCOPY/URETEROSCOPY/HOLMIUM LASER/STENT PLACEMENT Right 01/12/2020   Procedure: CYSTOSCOPY/ RIGHT RETROGRADE/ RIGHT URETEROSCOPY/ RIGHT HOLMIUM LASER/ RIGHT STENT PLACEMENT;  Surgeon: Ceasar Mons, MD;  Location: [facility];  Service: Urology;  Laterality: Right;  ONLY NEEDS 60 MIN  . HOLMIUM LASER APPLICATION N/A 10/12/6960   Procedure: HOLMIUM  LASER APPLICATION bladder stone;  Surgeon: Alexis Frock, MD;  Location: [facility];  Service: Urology;  Laterality: N/A;  . HOLMIUM LASER APPLICATION Left 40/97/3532   Procedure: HOLMIUM LASER APPLICATION;  Surgeon: Ceasar Mons, MD;  Location: [facility];  Service: Urology;  Laterality: Left;  . NEPHROLITHOTOMY  Left 09/29/2013   Procedure: NEPHROLITHOTOMY PERCUTANEOUS WITH ACCESS;  Surgeon: Alexis Frock, MD;  Location: [facility];  Service: Urology;  Laterality: Left;  . URETEROSCOPY N/A 09/29/2013   Procedure: URETEROSCOPY;  Surgeon: Alexis Frock, MD;  Location: [facility];  Service: Urology;  Laterality: N/A;    Family History  Problem Relation Age of Onset  . Coronary artery disease Father        in his 24's  . Hyperlipidemia Father   . Hypertension Father   . Hearing loss Father   . Heart disease Mother        deceased of heart disease  . Hypertension Mother   . Hyperlipidemia Mother   . Multiple sclerosis Son   . Heart disease Maternal Grandmother   . Heart disease Maternal Grandfather   . Heart disease Paternal Grandmother   . Heart disease Paternal Grandfather   . GER disease Sister   . Other Neg Hx        no lung cancer, colon cancer, or prostate  . Colon cancer Neg Hx   . Stomach cancer Neg Hx   . Esophageal cancer Neg Hx     Social History   Socioeconomic History  . Marital status: Married    Spouse name: Not on file  . Number of children: Not on file  . Years of education: Not on file  . Highest education level: Not on file  Occupational History  . Not on file  Tobacco Use  . Smoking status: Former Smoker    Years: 45.00    Types: Cigarettes    Quit date: 03/12/2007    Years since quitting: 13.1  . Smokeless tobacco: Never Used  Substance and Sexual Activity  . Alcohol use: No  . Drug use: No  . 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 Not on file  Social History Narrative   married -6 yrs  [name] [place])   grew up in [place]   He has one son - two grandaugthers     He is retired.  He had     a 40 pack-year smoking history but has discontinued.     prev occuptation -  [employer]   Alcohol use-no   Smoking Status:  quit   Packs/Day:  0.5   Caffeine use/day:  2  beverages daily   Does Patient Exercise:  yes            Social Determinants of Health   Financial Resource Strain: Not on file  Food Insecurity: Not on file  Transportation Needs: Not on file  Physical Activity: Not on file  Stress: Not on file  Social Connections: Not on file  Intimate Partner Violence: Not on file    Outpatient Medications Prior to Visit  Medication Sig Dispense Refill  . amLODipine (NORVASC) 10 MG tablet TAKE 1 TABLET BY MOUTH IN  THE MORNING 90 tablet 3  . ascorbic acid (VITAMIN C) 500 MG tablet Take 500 mg by mouth daily.    Marland Kitchen aspirin 81 MG tablet Take 81 mg by mouth every morning.    Marland Kitchen  gabapentin (NEURONTIN) 300 MG capsule Take 300 mg by mouth 2 (two) times daily. One capsule at lunch and two capsule at bedtime    . losartan (COZAAR) 100 MG tablet TAKE 1 TABLET BY MOUTH IN  THE MORNING 90 tablet 3  . metoprolol tartrate (LOPRESSOR) 25 MG tablet TAKE ONE-HALF TABLET BY  MOUTH TWICE DAILY 90 tablet 3  . Multiple Vitamins-Minerals (ICAPS PO) Take by mouth every morning.     . potassium citrate (UROCIT-K) 10 MEQ (1080 MG) SR tablet Take 10 mEq by mouth in the morning and at bedtime.   3  . rosuvastatin (CRESTOR) 40 MG tablet TAKE 1 TABLET BY MOUTH  DAILY 90 tablet 3   No facility-administered medications prior to visit.    No Known Allergies  Review of Systems  Constitutional: Negative for fever and malaise/fatigue.  HENT: Positive for ear pain. Negative for congestion.   Eyes: Negative for blurred vision.  Respiratory: Negative for shortness of breath.   Cardiovascular: Negative for chest pain, palpitations and leg swelling.  Gastrointestinal: Negative for abdominal pain, blood in stool and nausea.  Genitourinary: Negative for dysuria and frequency.  Musculoskeletal: Positive for back pain and myalgias. Negative for falls.  Skin: Negative for rash.  Neurological: Negative for dizziness, loss of consciousness and headaches.  Endo/Heme/Allergies:  Negative for environmental allergies.  Psychiatric/Behavioral: Negative for depression. The patient is not nervous/anxious.        Objective:    Physical Exam Vitals and nursing note reviewed.  Constitutional:      General: He is not in acute distress.    Appearance: He is well-developed and well-nourished.  HENT:     Head: Normocephalic and atraumatic.     Right Ear: External ear normal. There is impacted cerumen.     Left Ear: External ear normal. There is impacted cerumen.     Nose: Nose normal.  Eyes:     General:        Right eye: No discharge.        Left eye: No discharge.  Cardiovascular:     Rate and Rhythm: Normal rate and regular rhythm.     Heart sounds: No murmur heard.   Pulmonary:     Effort: Pulmonary effort is normal.     Breath sounds: Normal breath sounds.  Abdominal:     General: Bowel sounds are normal.     Palpations: Abdomen is soft.     Tenderness: There is no abdominal tenderness.  Musculoskeletal:        General: No edema.     Cervical back: Normal range of motion and neck supple.  Skin:    General: Skin is warm and dry.  Neurological:     Mental Status: He is alert and oriented to person, place, and time.  Psychiatric:        Mood and Affect: Mood and affect normal.     BP 138/68   Pulse 66   Temp 97.7 F (36.5 C)   Resp 16   Ht 5\' 6"  (1.676 m)   Wt 180 lb (81.6 kg)   SpO2 99%   BMI 29.05 kg/m  Wt Readings from Last 3 Encounters:  05/04/20 180 lb (81.6 kg)  03/29/20 176 lb 12.8 oz (80.2 kg)  01/12/20 172 lb 3.2 oz (78.1 kg)    Diabetic Foot Exam - Simple   No data filed    Lab Results  Component Value Date   WBC 7.8 05/04/2020   HGB 14.6 05/04/2020  HCT 42.8 05/04/2020   PLT 219.0 05/04/2020   GLUCOSE 94 05/04/2020   CHOL 101 05/04/2020   TRIG 189.0 (H) 05/04/2020   HDL 37.60 (L) 05/04/2020   LDLDIRECT 41.0 04/20/2019   LDLCALC 25 05/04/2020   ALT 20 05/04/2020   AST 15 05/04/2020   NA 140 05/04/2020   K 4.0  05/04/2020   CL 103 05/04/2020   CREATININE 0.89 05/04/2020   BUN 12 05/04/2020   CO2 30 05/04/2020   TSH 3.40 05/04/2020   PSA 1.70ng/mL 02/19/2018   INR 1.05 05/25/2009   HGBA1C 6.2 05/04/2020    Lab Results  Component Value Date   TSH 3.40 05/04/2020   Lab Results  Component Value Date   WBC 7.8 05/04/2020   HGB 14.6 05/04/2020   HCT 42.8 05/04/2020   MCV 93.4 05/04/2020   PLT 219.0 05/04/2020   Lab Results  Component Value Date   NA 140 05/04/2020   K 4.0 05/04/2020   CO2 30 05/04/2020   GLUCOSE 94 05/04/2020   BUN 12 05/04/2020   CREATININE 0.89 05/04/2020   BILITOT 0.7 05/04/2020   ALKPHOS 70 05/04/2020   AST 15 05/04/2020   ALT 20 05/04/2020   PROT 6.9 05/04/2020   ALBUMIN 4.3 05/04/2020   CALCIUM 9.0 05/04/2020   ANIONGAP 12 09/30/2013   GFR 84.21 05/04/2020   Lab Results  Component Value Date   CHOL 101 05/04/2020   Lab Results  Component Value Date   HDL 37.60 (L) 05/04/2020   Lab Results  Component Value Date   LDLCALC 25 05/04/2020   Lab Results  Component Value Date   TRIG 189.0 (H) 05/04/2020   Lab Results  Component Value Date   CHOLHDL 3 05/04/2020   Lab Results  Component Value Date   HGBA1C 6.2 05/04/2020       Assessment & Plan:   Problem List Items Addressed This Visit    Hyperlipidemia, mixed    Tolerating statin, encouraged heart healthy diet, avoid trans fats, minimize simple carbs and saturated fats. Increase exercise as tolerated.       Relevant Orders   Lipid panel (Completed)   Hypertension    Well controlled, no changes to meds. Encouraged heart healthy diet such as the DASH diet and exercise as tolerated.       Relevant Orders   CBC with Differential/Platelet (Completed)   Comprehensive metabolic panel (Completed)   TSH (Completed)   Hyperglycemia - Primary    hgba1c acceptable, minimize simple carbs. Increase exercise as tolerated.       Relevant Orders   Hemoglobin A1c (Completed)   Cerumen  impaction    Encouraged to use 3-5 drops of H2O2 after a shower in each ear for 5 days and then return for flushing.       DDD (degenerative disc disease), lumbar    In September was chopping wood and developed right thigh pain he went to [facility] and they found a bulging disc fortunately with PT he has improved and will continue to follow with ortho      Relevant Orders   Hemoglobin A1c (Completed)      I am having Bishop Limbo "Rudy" maintain his aspirin, Multiple Vitamins-Minerals (ICAPS PO), potassium citrate, ascorbic acid, gabapentin, metoprolol tartrate, rosuvastatin, losartan, and amLODipine.  No orders of the defined types were placed in this encounter.    Penni Homans, MD

## 2020-05-07 NOTE — Assessment & Plan Note (Signed)
Encouraged to use 3-5 drops of H2O2 after a shower in each ear for 5 days and then return for flushing.

## 2020-05-15 ENCOUNTER — Other Ambulatory Visit: Payer: Self-pay

## 2020-05-16 ENCOUNTER — Ambulatory Visit (INDEPENDENT_AMBULATORY_CARE_PROVIDER_SITE_OTHER): Payer: Medicare Other | Admitting: Medical

## 2020-05-16 VITALS — BP 143/63 | HR 75 | Resp 18 | Ht 66.0 in | Wt 181.0 lb

## 2020-05-16 DIAGNOSIS — I2510 Atherosclerotic heart disease of native coronary artery without angina pectoris: Secondary | ICD-10-CM | POA: Diagnosis not present

## 2020-05-16 DIAGNOSIS — H6123 Impacted cerumen, bilateral: Secondary | ICD-10-CM

## 2020-05-16 NOTE — Patient Instructions (Signed)
Recent cerumen impaction bilaterally.  We lavaged ears today and appears 90% or more of wax removed.  Only potential residual amount present just adjacent to tympanic membrane.  We discussed possibly attempting removal residual mild and decided against.  If he has any persisting wax obstruction type signs and symptoms we can refer you to ENT to remove residual amount.  I do not think clinically indicated presently.  If you have recurrent wax in the future recommend using Debrox over-the-counter 2 to 3 days prior to office visit/attempted lavage.  Follow-up as regular scheduled with PCP or as needed.

## 2020-05-16 NOTE — Progress Notes (Signed)
Subjective:    Patient ID: Anthony [name], male    DOB: 03/17/1945, 75 y.o.   MRN: [id number]  HPI  Pt in for some bilateral cerumen impacted feeling. Ears feels cloggs. Worse on left side. 6 weeks of symptoms. This does happen periodically about once a year. No uri infeciton symptoms or nasal congestion.    Review of Systems  Constitutional: Negative for chills, fatigue and fever.  HENT:       Bilateral cerumen impaction.  Respiratory: Negative for chest tightness, shortness of breath and wheezing.   Cardiovascular: Negative for chest pain and palpitations.  Gastrointestinal: Negative for abdominal pain.  Musculoskeletal: Negative for back pain.  Skin: Negative for rash.  Neurological: Negative for dizziness, speech difficulty, numbness and headaches.  Hematological: Negative for adenopathy. Does not bruise/bleed easily.  Psychiatric/Behavioral: Negative for behavioral problems and confusion.    Past Medical History:  Diagnosis Date  . Allergic rhinitis   . BPH (benign prostatic hyperplasia)   . CAD (coronary artery disease) 05/26/2009 :  primary cardiolgoist-  dr Stanford Breed   hx NSTEMI cardiac catherization  revealed-left main normal; LAD proximal calcification,  long proximal 30% stenosis,  Circumflex  AV groove   proximal  60% stenosis at mid obtuse marginal,  first large mid obtuse marginal  ostial 70% stenosis,  prox. to mid RCA  99% stenosis w/ intervention PCI and DES  . Carotid stenosis, asymptomatic, bilateral    per last duplex 03-31-2015  bilateral ICA 1-39%  . Cataract    removed  . External hemorrhoids    large external  . GERD (gastroesophageal reflux disease)   . History of adenomatous polyp of colon   . History of kidney stones   . History of non-ST elevation myocardial infarction (NSTEMI) 05/26/2009  . Hyperlipidemia   . Hypertension    8-10 years  . Left ureteral stone   . Macular degeneration   . Myocardial infarction ([place])   . Nephrolithiasis     right side non-obstructive per CT 01-03-2017  . S/P drug eluting coronary stent placement 05/26/2009   DESx1  to pRCA  . Wears glasses      Social History   Socioeconomic History  . Marital status: Married    Spouse name: Not on file  . Number of children: Not on file  . Years of education: Not on file  . Highest education level: Not on file  Occupational History  . Not on file  Tobacco Use  . Smoking status: Former Smoker    Years: 45.00    Types: Cigarettes    Quit date: 03/12/2007    Years since quitting: 13.1  . Smokeless tobacco: Never Used  Substance and Sexual Activity  . Alcohol use: No  . Drug use: No  . 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 Not on file  Social History Narrative   married -38 yrs  [name])   grew up in [place]   He has one son - two grandaugthers     He is retired.  He had     a 40 pack-year smoking history but has discontinued.     prev occuptation -  [employer]   Alcohol use-no   Smoking Status:  quit   Packs/Day:  0.5   Caffeine use/day:  2 beverages daily   Does Patient Exercise:  yes  Social Determinants of Health   Financial Resource Strain: Not on file  Food Insecurity: Not on file  Transportation Needs: Not on file  Physical Activity: Not on file  Stress: Not on file  Social Connections: Not on file  Intimate Partner Violence: Not on file    Past Surgical History:  Procedure Laterality Date  . CARDIOVASCULAR STRESS TEST  12/26/2011   dr Stanford Breed   normal nuclear study w/ no ischemia/  normal LV function and wall motion , ef 71%  . CATARACT EXTRACTION W/ INTRAOCULAR LENS  IMPLANT, BILATERAL  2010  . COLONOSCOPY  last one 06-10-2016  . CORONARY ANGIOPLASTY WITH STENT PLACEMENT  05-26-2009  dr hochrein/ dr Darnell Level brodie   Severe RCA stenosis and moderate stenosis in the left system, ef 50% with inferior hypokinsis:   PCI and DES x1 to RCA ([facility])  . CYSTOSCOPY W/ RETROGRADES Bilateral 09/29/2013   Procedure: CYSTOSCOPY WITH RETROGRADE PYELOGRAM;  Surgeon: Alexis Frock, MD;  Location: [facility];  Service: Urology;  Laterality: Bilateral;  . CYSTOSCOPY WITH RETROGRADE PYELOGRAM, URETEROSCOPY AND STENT PLACEMENT N/A 06/14/2015   Procedure: CYSTOSCOPY WITH RETROGRADE PYELOGRAM, cysto litholapexy ;  Surgeon: Alexis Frock, MD;  Location: [facility];  Service: Urology;  Laterality: N/A;  . CYSTOSCOPY/URETEROSCOPY/HOLMIUM LASER/STENT PLACEMENT Left 01/07/2017   Procedure: CYSTOSCOPY/RETROGRADE/URETEROSCOPY/HOLMIUM LASER/STENT PLACEMENT, FULGERATION OF PROSTATIC URETHRA;  Surgeon: Ceasar Mons, MD;  Location: [facility];  Service: Urology;  Laterality: Left;  . CYSTOSCOPY/URETEROSCOPY/HOLMIUM LASER/STENT PLACEMENT Right 01/12/2020   Procedure: CYSTOSCOPY/ RIGHT RETROGRADE/ RIGHT URETEROSCOPY/ RIGHT HOLMIUM LASER/ RIGHT STENT PLACEMENT;  Surgeon: Ceasar Mons, MD;  Location[facility];  Service: Urology;  Laterality: Right;  ONLY NEEDS 60 MIN  . HOLMIUM LASER APPLICATION N/A 08/11/1306   Procedure: HOLMIUM LASER APPLICATION bladder stone;  Surgeon: Alexis Frock, MD;  Location: [facility];  Service: Urology;  Laterality: N/A;  . HOLMIUM LASER APPLICATION Left 65/78/4696   Procedure: HOLMIUM LASER APPLICATION;  Surgeon: Ceasar Mons, MD;  Location: [facility];  Service: Urology;  Laterality: Left;  . NEPHROLITHOTOMY Left 09/29/2013   Procedure: NEPHROLITHOTOMY PERCUTANEOUS WITH ACCESS;  Surgeon: Alexis Frock, MD;  Location: [facility];  Service: Urology;  Laterality: Left;  . URETEROSCOPY N/A 09/29/2013   Procedure: URETEROSCOPY;  Surgeon: Alexis Frock, MD;  Location: [facility];  Service: Urology;  Laterality: N/A;    Family History  Problem Relation Age of Onset  . Coronary artery disease Father        in  his 74's  . Hyperlipidemia Father   . Hypertension Father   . Hearing loss Father   . Heart disease Mother        deceased of heart disease  . Hypertension Mother   . Hyperlipidemia Mother   . Multiple sclerosis Son   . Heart disease Maternal Grandmother   . Heart disease Maternal Grandfather   . Heart disease Paternal Grandmother   . Heart disease Paternal Grandfather   . GER disease Sister   . Other Neg Hx        no lung cancer, colon cancer, or prostate  . Colon cancer Neg Hx   . Stomach cancer Neg Hx   . Esophageal cancer Neg Hx     No Known Allergies  Current Outpatient Medications on File Prior to Visit  Medication Sig Dispense Refill  . amLODipine (NORVASC) 10 MG tablet TAKE 1 TABLET BY MOUTH IN  THE MORNING 90 tablet 3  . ascorbic  acid (VITAMIN C) 500 MG tablet Take 500 mg by mouth daily.    Marland Kitchen aspirin 81 MG tablet Take 81 mg by mouth every morning.    . gabapentin (NEURONTIN) 300 MG capsule Take 300 mg by mouth 2 (two) times daily. One capsule at lunch and two capsule at bedtime    . losartan (COZAAR) 100 MG tablet TAKE 1 TABLET BY MOUTH IN  THE MORNING 90 tablet 3  . metoprolol tartrate (LOPRESSOR) 25 MG tablet TAKE ONE-HALF TABLET BY  MOUTH TWICE DAILY 90 tablet 3  . Multiple Vitamins-Minerals (ICAPS PO) Take by mouth every morning.     . potassium citrate (UROCIT-K) 10 MEQ (1080 MG) SR tablet Take 10 mEq by mouth in the morning and at bedtime.   3  . rosuvastatin (CRESTOR) 40 MG tablet TAKE 1 TABLET BY MOUTH  DAILY 90 tablet 3   No current facility-administered medications on file prior to visit.    BP (!) 143/63   Pulse 75   Resp 18   Ht 5\' 6"  (1.676 m)   Wt 181 lb (82.1 kg)   SpO2 100%   BMI 29.21 kg/m      Objective:   Physical Exam  General- No acute distress. Pleasant patient. Neurologic- CNII- XII grossly intact.  Bilateral cerumen impaction. Moderate to severe. Can't see tm at all on either side. Post lavage wax removed about 90%. Only deep wax  possible present difficult to assess to think layer of foamy liquid covering tm.      Assessment & Plan:  Recent cerumen impaction bilaterally.  We lavaged ears today and appears 90% or more of wax removed.  Only potential residual amount present just adjacent to tympanic membrane.  We discussed possibly attempting removal residual mild and decided against.  If he has any persisting wax obstruction type signs and symptoms we can refer you to ENT to remove residual amount.  I do not think clinically indicated presently.  If you have recurrent wax in the future recommend using Debrox over-the-counter 2 to 3 days prior to office visit/attempted lavage.  Follow-up as regular scheduled with PCP or as needed.  Time spent with patient today was 20 minutes  discussing diagnosis,  treatment and documentation.

## 2020-09-07 ENCOUNTER — Telehealth: Payer: Self-pay | Admitting: Family Medicine

## 2020-09-07 NOTE — Telephone Encounter (Signed)
Copied from [place] [phone]. Topic: Medicare AWV >> Sep 07, 2020  9:54 AM Harris-Coley, Hannah Beat wrote: Reason for CRM: Left message for patient to schedule Annual Wellness Visit.  Please schedule with Health Nurse Advisor Augustine Radar. at [facility].

## 2020-09-13 NOTE — Progress Notes (Signed)
Subjective:   Anthony [name] is a 75 y.o. male who presents for Medicare Annual/Subsequent preventive examination.  I connected with Vimal today by telephone and verified that I am speaking with the correct person using two identifiers. Location patient: home Location provider: work Persons participating in the virtual visit: patient, Marine scientist.    I discussed the limitations, risks, security and privacy concerns of performing an evaluation and management service by telephone and the availability of in person appointments. I also discussed with the patient that there may be a patient responsible charge related to this service. The patient expressed understanding and verbally consented to this telephonic visit.    Interactive audio and video telecommunications were attempted between this provider and patient, however failed, due to patient having technical difficulties OR patient did not have access to video capability.  We continued and completed visit with audio only.  Some vital signs may be absent or patient reported.   Time Spent with patient on telephone encounter: 20 minutes   Review of Systems     Cardiac Risk Factors include: advanced age (>20men, >32 women);hypertension;dyslipidemia     Objective:    Today's Vitals   09/14/20 0902  BP: 133/73  Pulse: 69  Temp: 97.7 F (36.5 C)  SpO2: 95%  Weight: 181 lb (82.1 kg)  Height: 5\' 6"  (1.676 m)   Body mass index is 29.21 kg/m.  Advanced Directives 09/14/2020 01/12/2020 11/13/2019 04/23/2019 04/20/2018 04/18/2017 01/07/2017  Does Patient Have a Medical Advance Directive? Yes Yes Yes Yes Yes Yes Yes  Type of Paramedic of [place];Living will Living will;Healthcare Power of [place];Living will Living will;Healthcare Power of [place];Living will [place];Living will [place];Living will  Does patient want to  make changes to medical advance directive? - - No - Patient declined No - Patient declined No - Patient declined No - Patient declined No - Patient declined  Copy of [place] in Chart? Yes - validated most recent copy scanned in chart (See row information) No - copy requested No - copy requested Yes - validated most recent copy scanned in chart (See row information) Yes - validated most recent copy scanned in chart (See row information) Yes No - copy requested  Would patient like information on creating a medical advance directive? - - No - Patient declined - - - -  Pre-existing out of facility DNR order (yellow form or pink MOST form) - - - - - - -    Current Medications (verified) Outpatient Encounter Medications as of 09/14/2020  Medication Sig   amLODipine (NORVASC) 10 MG tablet TAKE 1 TABLET BY MOUTH IN  THE MORNING   ascorbic acid (VITAMIN C) 500 MG tablet Take 500 mg by mouth daily.   aspirin 81 MG tablet Take 81 mg by mouth every morning.   gabapentin (NEURONTIN) 300 MG capsule Take 300 mg by mouth 2 (two) times daily. One capsule at lunch and two capsule at bedtime   losartan (COZAAR) 100 MG tablet TAKE 1 TABLET BY MOUTH IN  THE MORNING   metoprolol tartrate (LOPRESSOR) 25 MG tablet TAKE ONE-HALF TABLET BY  MOUTH TWICE DAILY   Multiple Vitamins-Minerals (ICAPS PO) Take by mouth every morning.    potassium citrate (UROCIT-K) 10 MEQ (1080 MG) SR tablet Take 10 mEq by mouth in the morning and at bedtime.    rosuvastatin (CRESTOR) 40 MG tablet TAKE 1 TABLET BY  MOUTH  DAILY   No facility-administered encounter medications on file as of 09/14/2020.    Allergies (verified) Patient has no known allergies.   History: Past Medical History:  Diagnosis Date   Allergic rhinitis    BPH (benign prostatic hyperplasia)    CAD (coronary artery disease) 05/26/2009 :  primary cardiolgoist-  dr Stanford Breed   hx NSTEMI cardiac catherization  revealed-left main normal; LAD proximal  calcification,  long proximal 30% stenosis,  Circumflex  AV groove   proximal  60% stenosis at mid obtuse marginal,  first large mid obtuse marginal  ostial 70% stenosis,  prox. to mid RCA  99% stenosis w/ intervention PCI and DES   Carotid stenosis, asymptomatic, bilateral    per last duplex 03-31-2015  bilateral ICA 1-39%   Cataract    removed   External hemorrhoids    large external   GERD (gastroesophageal reflux disease)    History of adenomatous polyp of colon    History of kidney stones    History of non-ST elevation myocardial infarction (NSTEMI) 05/26/2009   Hyperlipidemia    Hypertension    8-10 years   Left ureteral stone    Macular degeneration    Myocardial infarction [facility])    Nephrolithiasis    right side non-obstructive per CT 01-03-2017   S/P drug eluting coronary stent placement 05/26/2009   DESx1  to pRCA   Wears glasses    Past Surgical History:  Procedure Laterality Date   CARDIOVASCULAR STRESS TEST  12/26/2011   dr Stanford Breed   normal nuclear study w/ no ischemia/  normal LV function and wall motion , ef 71%   CATARACT EXTRACTION W/ INTRAOCULAR LENS  IMPLANT, BILATERAL  2010   COLONOSCOPY  last one 06-10-2016   CORONARY ANGIOPLASTY WITH STENT PLACEMENT  05-26-2009  dr hochrein/ dr Darnell Level brodie   Severe RCA stenosis and moderate stenosis in the left system, ef 50% with inferior hypokinsis:  PCI and DES x1 to RCA (Prom[facility])   CYSTOSCOPY W/ RETROGRADES Bilateral 09/29/2013   Procedure: CYSTOSCOPY WITH RETROGRADE PYELOGRAM;  Surgeon: Alexis Frock, MD;  Location: [facility];  Service: Urology;  Laterality: Bilateral;   CYSTOSCOPY WITH RETROGRADE PYELOGRAM, URETEROSCOPY AND STENT PLACEMENT N/A 06/14/2015   Procedure: CYSTOSCOPY WITH RETROGRADE PYELOGRAM, cysto litholapexy ;  Surgeon: Alexis Frock, MD;  [facility];  Service: Urology;  Laterality: N/A;   CYSTOSCOPY/URETEROSCOPY/HOLMIUM LASER/STENT PLACEMENT Left 01/07/2017   Procedure:  CYSTOSCOPY/RETROGRADE/URETEROSCOPY/HOLMIUM LASER/STENT PLACEMENT, FULGERATION OF PROSTATIC URETHRA;  Surgeon: Ceasar Mons, MD;  [facility];  Service: Urology;  Laterality: Left;   CYSTOSCOPY/URETEROSCOPY/HOLMIUM LASER/STENT PLACEMENT Right 01/12/2020   Procedure: CYSTOSCOPY/ RIGHT RETROGRADE/ RIGHT URETEROSCOPY/ RIGHT HOLMIUM LASER/ RIGHT STENT PLACEMENT;  Surgeon: Ceasar Mons, MD;  Location[facility];  Service: Urology;  Laterality: Right;  ONLY NEEDS 60 MIN   HOLMIUM LASER APPLICATION N/A 10/10/9560   Procedure: HOLMIUM LASER APPLICATION bladder stone;  Surgeon: Alexis Frock, MD;  [facility];  Service: Urology;  Laterality: N/A;   HOLMIUM LASER APPLICATION Left 13/10/6576   Procedure: HOLMIUM LASER APPLICATION;  Surgeon: Ceasar Mons, MD;  Location: [facility];  Service: Urology;  Laterality: Left;   NEPHROLITHOTOMY Left 09/29/2013   Procedure: NEPHROLITHOTOMY PERCUTANEOUS WITH ACCESS;  Surgeon: Alexis Frock, MD;  Location[facility];  Service: Urology;  Laterality: Left;   URETEROSCOPY N/A 09/29/2013   Procedure: URETEROSCOPY;  Surgeon: Alexis Frock, MD;  Location: [facility];  Service: Urology;  Laterality: N/A;   Family History  Problem Relation Age of Onset   Coronary artery disease Father        in his 1's   Hyperlipidemia Father    Hypertension Father    Hearing loss Father    Heart disease Mother        deceased of heart disease   Hypertension Mother    Hyperlipidemia Mother    Multiple sclerosis Son    Heart disease Maternal Grandmother    Heart disease Maternal Grandfather    Heart disease Paternal Grandmother    Heart disease Paternal Grandfather    GER disease Sister    Other Neg Hx        no lung cancer, colon cancer, or prostate   Colon cancer Neg Hx    Stomach cancer Neg Hx    Esophageal cancer Neg Hx    Social History   Socioeconomic History    Marital status: Married    Spouse name: Not on file   Number of children: Not on file   Years of education: Not on file   Highest education level: Not on file  Occupational History   Not on file  Tobacco Use   Smoking status: Former    Years: 45.00    Pack years: 0.00    Types: Cigarettes    Quit date: 03/12/2007    Years since quitting: 13.5   Smokeless tobacco: Never  Substance and Sexual Activity   Alcohol use: No   Drug use: No   Sexual activity: Yes    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  He had     a 40 pack-year smoking history but has discontinued.     prev occuptation -  [employer]   Alcohol use-no   Smoking Status:  quit   Packs/Day:  0.5   Caffeine use/day:  2 beverages daily   Does Patient Exercise:  yes            Social Determinants of Health   Financial Resource Strain: Low Risk    Difficulty of Paying Living Expenses: Not hard at all  Food Insecurity: No Food Insecurity   Worried About Charity fundraiser in the Last Year: Never true   Ran Out of Food in the Last Year: Never true  Transportation Needs: No Transportation Needs   Lack of Transportation (Medical): No   Lack of Transportation (Non-Medical): No  Physical Activity: Sufficiently Active   Days of Exercise per Week: 7 days   Minutes of Exercise per Session: 30 min  Stress: No Stress Concern Present   Feeling of Stress : Not at all  Social Connections: Moderately Isolated   Frequency of Communication with Friends and Family: More than three times a week   Frequency of Social Gatherings with Friends and Family: More than three times a week   Attends Religious Services: Never   Marine scientist or Organizations: No   Attends Music therapist: Never    Marital Status: Married    Tobacco Counseling Counseling given: Not Answered   Clinical Intake:  Pre-visit preparation completed: Yes  Pain : No/denies pain     Nutritional Status: BMI 25 -29 Overweight  Nutritional Risks: None Diabetes: No  How often do you need to have someone help you when you read instructions, pamphlets, or other written materials from your doctor or pharmacy?: 1 - Never  Diabetic?No  Interpreter Needed?: No  Information entered by :: Caroleen Hamman LPN   Activities of Daily Living In your present state of health, do you have any difficulty performing the following activities: 09/14/2020 05/04/2020  Hearing? N N  Vision? N N  Difficulty concentrating or making decisions? N N  Walking or climbing stairs? N N  Dressing or bathing? N N  Doing errands, shopping? N N  Preparing Food and eating ? N -  Using the Toilet? N -  In the past six months, have you accidently leaked urine? N -  Do you have problems with loss of bowel control? N -  Managing your Medications? N -  Managing your Finances? N -  Housekeeping or managing your Housekeeping? N -  Some recent data might be hidden    Patient Care Team: [name], MD as PCP - General (Family Medicine) [place], Jennefer Bravo, MD as Referring Physician (Dermatology) Alexis Frock, MD as Consulting Physician (Urology) Gerarda Fraction, MD as Referring Physician (Ophthalmology) Lelon Perla, MD as Consulting Physician (Cardiology) Ceasar Mons, MD as Consulting Physician (Urology)  Indicate any recent Medical Services you may have received from other than [facility] providers in the past year (date may be approximate).     Assessment:   This is a routine wellness examination for Anthony [name].  Hearing/Vision screen Hearing Screening - Comments:: No issues Vision Screening - Comments:: Wears glasses Last [facility]-06/[facility]  Dietary issues and exercise activities  discussed: Current Exercise Habits: Home exercise routine, Type of exercise: walking, Time (Minutes): 30, Frequency (Times/Week): 7, Weekly Exercise (Minutes/Week): 210, Intensity: Mild, Exercise limited by: None identified   Goals Addressed               This Visit's Progress     Patient Stated     Maintain current active lifestyle.  (pt-stated)   On track     Other     Patient Stated        Lose some weight        Depression Screen PHQ 2/9 Scores 09/14/2020 05/04/2020 04/23/2019 10/22/2018 04/20/2018 04/18/2017 04/16/2016  PHQ - 2 Score 0 0 0 0 0 0 0  PHQ- 9 Score - 0 - - - - -    Fall Risk Fall Risk  09/14/2020 05/04/2020 04/23/2019 04/20/2018 04/18/2017  Falls in the past year? 0 1 0 0 No  Number falls in past yr: 0 0 0 - -  Injury with Fall? 0 1 0 - -  Follow up Falls prevention discussed - Education provided;Falls prevention discussed - -    FALL RISK PREVENTION PERTAINING TO THE HOME:  Any stairs in or around the home? Yes  If so, are there any without handrails? No  Home free of loose throw rugs in walkways, pet beds, electrical cords, etc? Yes  Adequate lighting in your home to reduce risk of falls? Yes   ASSISTIVE DEVICES UTILIZED TO PREVENT FALLS:  Life alert? No  Use of a cane, walker or w/c? No  Grab bars in the bathroom? Yes  Shower chair or bench in shower? No  Elevated toilet seat or a handicapped toilet? No   TIMED UP AND GO:  Was the test performed? No . Phone visit   Cognitive Function:Normal cognitive status assessed  by direct observation by this Nurse Health Advisor. No abnormalities found.   MMSE - Mini Mental State Exam 04/20/2018 04/18/2017  Orientation to time 5 5  Orientation to Place 5 5  Registration 3 3  Attention/ Calculation 5 5  Recall 3 3  Language- name 2 objects 2 2  Language- repeat 1 1  Language- follow 3 step command 3 3  Language- read & follow direction 1 1  Write a sentence 1 1  Copy design 1 1  Total score 30 30         Immunizations Immunization History  Administered Date(s) Administered   Fluad Quad(high Dose 65+) 11/06/2018, 12/02/2019   Influenza Split 12/07/2010, 11/20/2011, 12/05/2011   Influenza Whole 01/06/2009, 11/20/2009   Influenza, High Dose Seasonal PF 12/06/2015, 12/17/2016, 12/01/2017   Influenza,inj,Quad PF,6+ Mos 12/09/2012, 12/15/2013, 11/30/2014   PFIZER(Purple Top)SARS-COV-2 Vaccination 04/18/2019, 05/12/2019, 02/11/2020   Pneumococcal Conjugate-13 04/14/2015   Pneumococcal Polysaccharide-23 05/11/2009, 12/17/2016   Td 10/27/2007   Zoster Recombinat (Shingrix) 12/24/2017, 03/09/2018   Zoster, Live 05/30/2008    TDAP status: Due, Education has been provided regarding the importance of this vaccine. Advised may receive this vaccine at local pharmacy or Health Dept. Aware to provide a copy of the vaccination record if obtained from local pharmacy or Health Dept. Verbalized acceptance and understanding.  Flu Vaccine status: Up to date  Pneumococcal vaccine status: Up to date  Covid-19 vaccine status: Completed vaccines  Qualifies for Shingles Vaccine? No   Zostavax completed Yes   Shingrix Completed?: Yes  Screening Tests Health Maintenance  Topic Date Due   TETANUS/TDAP  10/26/2017   COVID-19 Vaccine (4 - Booster for Pfizer series) 06/11/2020   INFLUENZA VACCINE  10/09/2020   COLONOSCOPY (Pts 45-22yrs Insurance coverage will need to be confirmed)  08/04/2024   Hepatitis C Screening  Completed   PNA vac Low Risk Adult  Completed   Zoster Vaccines- Shingrix  Completed   HPV VACCINES  Aged Out    Health Maintenance  Health Maintenance Due  Topic Date Due   TETANUS/TDAP  10/26/2017   COVID-19 Vaccine (4 - Booster for [place] series) 06/11/2020    Colorectal cancer screening: Type of screening: Colonoscopy. Completed 08/05/2019. Repeat every 5 years  Lung Cancer Screening: (Low Dose CT Chest recommended if Age 57-80 years, 30 pack-year currently smoking OR have quit  w/in 15years.) does not qualify.     Additional Screening:  Hepatitis C Screening: Completed 04/14/2015  Vision Screening: Recommended annual ophthalmology exams for early detection of glaucoma and other disorders of the eye. Is the patient up to date with their annual eye exam?  Yes  Who is the provider or what is the name of the office in which the patient attends annual eye exams? [facility] Screening: Recommended annual dental exams for proper oral hygiene  Community Resource Referral / Chronic Care Management: CRR required this visit?  No   CCM required this visit?  No      Plan:     I have personally reviewed and noted the following in the patient's chart:   Medical and social history Use of alcohol, tobacco or illicit drugs  Current medications and supplements including opioid prescriptions. Patient is not currently taking opioid prescriptions. Functional ability and status Nutritional status Physical activity Advanced directives List of other physicians Hospitalizations, surgeries, and ER visits in previous 12 months Vitals Screenings to include cognitive, depression, and falls Referrals and appointments  In addition, I have reviewed and  discussed with patient certain preventive protocols, quality metrics, and best practice recommendations. A written personalized care plan for preventive services as well as general preventive health recommendations were provided to patient.   Due to this being a telephonic visit, the after visit summary with patients personalized plan was offered to patient via mail or my-chart. Patient would like to access on my-chart.    Marta Antu, LPN   04/15/3662  Nurse Health Advisor  Nurse Notes: None

## 2020-09-14 ENCOUNTER — Ambulatory Visit (INDEPENDENT_AMBULATORY_CARE_PROVIDER_SITE_OTHER): Payer: Medicare Other

## 2020-09-14 VITALS — BP 133/73 | HR 69 | Temp 97.7°F | Ht 66.0 in | Wt 181.0 lb

## 2020-09-14 DIAGNOSIS — Z0000 Encounter for general adult medical examination without abnormal findings: Secondary | ICD-10-CM

## 2020-09-14 NOTE — Patient Instructions (Signed)
Anthony [name] , Thank you for taking time to complete your Medicare Wellness Visit. I appreciate your ongoing commitment to your health goals. Please review the following plan we discussed and let me know if I can assist you in the future.   Screening recommendations/referrals: Colonoscopy: Completed 08/05/2019-Due 08/04/2024 Recommended yearly ophthalmology/optometry visit for glaucoma screening and checkup Recommended yearly dental visit for hygiene and checkup  Vaccinations: Influenza vaccine: Up to date Pneumococcal vaccine: Up to date Tdap vaccine: Discuss with pharmacy Shingles vaccine:Completed vaccines    Covid-19: 2nd Booster due-May obtain vaccine at your local pharmacy  Advanced directives: Copy in chart  Conditions/risks identified: See problem list  Next appointment: Follow up in one year for your annual wellness visit. 09/20/2021 2 8:20  Preventive Care 75 Years and Older, Male Preventive care refers to lifestyle choices and visits with your health care provider that can promote health and wellness. What does preventive care include? A yearly physical exam. This is also called an annual well check. Dental exams once or twice a year. Routine eye exams. Ask your health care provider how often you should have your eyes checked. Personal lifestyle choices, including: Daily care of your teeth and gums. Regular physical activity. Eating a healthy diet. Avoiding tobacco and drug use. Limiting alcohol use. Practicing safe sex. Taking low doses of aspirin every day. Taking vitamin and mineral supplements as recommended by your health care provider. What happens during an annual well check? The services and screenings done by your health care provider during your annual well check will depend on your age, overall health, lifestyle risk factors, and family history of disease. Counseling  Your health care provider may ask you questions about your: Alcohol use. Tobacco use. Drug  use. Emotional well-being. Home and relationship well-being. Sexual activity. Eating habits. History of falls. Memory and ability to understand (cognition). Work and work Statistician. Screening  You may have the following tests or measurements: Height, weight, and BMI. Blood pressure. Lipid and cholesterol levels. These may be checked every 5 years, or more frequently if you are over 75 years old. Skin check. Lung cancer screening. You may have this screening every year starting at age 13 if you have a 30-pack-year history of smoking and currently smoke or have quit within the past 15 years. Fecal occult blood test (FOBT) of the stool. You may have this test every year starting at age 75. Flexible sigmoidoscopy or colonoscopy. You may have a sigmoidoscopy every 5 years or a colonoscopy every 10 years starting at age 75. Prostate cancer screening. Recommendations will vary depending on your family history and other risks. Hepatitis C blood test. Hepatitis B blood test. Sexually transmitted disease (STD) testing. Diabetes screening. This is done by checking your blood sugar (glucose) after you have not eaten for a while (fasting). You may have this done every 1-3 years. Abdominal aortic aneurysm (AAA) screening. You may need this if you are a current or former smoker. Osteoporosis. You may be screened starting at age 75 if you are at high risk. Talk with your health care provider about your test results, treatment options, and if necessary, the need for more tests. Vaccines  Your health care provider may recommend certain vaccines, such as: Influenza vaccine. This is recommended every year. Tetanus, diphtheria, and acellular pertussis (Tdap, Td) vaccine. You may need a Td booster every 10 years. Zoster vaccine. You may need this after age 25. Pneumococcal 13-valent conjugate (PCV13) vaccine. One dose is recommended after age 17. Pneumococcal  polysaccharide (PPSV23) vaccine. One dose is  recommended after age 50. Talk to your health care provider about which screenings and vaccines you need and how often you need them. This information is not intended to replace advice given to you by your health care provider. Make sure you discuss any questions you have with your health care provider. Document Released: 03/24/2015 Document Revised: 11/15/2015 Document Reviewed: 12/27/2014 Elsevier Interactive Patient Education  2017 [place] Prevention in the Home Falls can cause injuries. They can happen to people of all ages. There are many things you can do to make your home safe and to help prevent falls. What can I do on the outside of my home? Regularly fix the edges of walkways and driveways and fix any cracks. Remove anything that might make you trip as you walk through a door, such as a raised step or threshold. Trim any bushes or trees on the path to your home. Use bright outdoor lighting. Clear any walking paths of anything that might make someone trip, such as rocks or tools. Regularly check to see if handrails are loose or broken. Make sure that both sides of any steps have handrails. Any raised decks and porches should have guardrails on the edges. Have any leaves, snow, or ice cleared regularly. Use sand or salt on walking paths during winter. Clean up any spills in your garage right away. This includes oil or grease spills. What can I do in the bathroom? Use night lights. Install grab bars by the toilet and in the tub and shower. Do not use towel bars as grab bars. Use non-skid mats or decals in the tub or shower. If you need to sit down in the shower, use a plastic, non-slip stool. Keep the floor dry. Clean up any water that spills on the floor as soon as it happens. Remove soap buildup in the tub or shower regularly. Attach bath mats securely with double-sided non-slip rug tape. Do not have throw rugs and other things on the floor that can make you  trip. What can I do in the bedroom? Use night lights. Make sure that you have a light by your bed that is easy to reach. Do not use any sheets or blankets that are too big for your bed. They should not hang down onto the floor. Have a firm chair that has side arms. You can use this for support while you get dressed. Do not have throw rugs and other things on the floor that can make you trip. What can I do in the kitchen? Clean up any spills right away. Avoid walking on wet floors. Keep items that you use a lot in easy-to-reach places. If you need to reach something above you, use a strong step stool that has a grab bar. Keep electrical cords out of the way. Do not use floor polish or wax that makes floors slippery. If you must use wax, use non-skid floor wax. Do not have throw rugs and other things on the floor that can make you trip. What can I do with my stairs? Do not leave any items on the stairs. Make sure that there are handrails on both sides of the stairs and use them. Fix handrails that are broken or loose. Make sure that handrails are as long as the stairways. Check any carpeting to make sure that it is firmly attached to the stairs. Fix any carpet that is loose or worn. Avoid having throw rugs at the top  or bottom of the stairs. If you do have throw rugs, attach them to the floor with carpet tape. Make sure that you have a light switch at the top of the stairs and the bottom of the stairs. If you do not have them, ask someone to add them for you. What else can I do to help prevent falls? Wear shoes that: Do not have high heels. Have rubber bottoms. Are comfortable and fit you well. Are closed at the toe. Do not wear sandals. If you use a stepladder: Make sure that it is fully opened. Do not climb a closed stepladder. Make sure that both sides of the stepladder are locked into place. Ask someone to hold it for you, if possible. Clearly mark and make sure that you can  see: Any grab bars or handrails. First and last steps. Where the edge of each step is. Use tools that help you move around (mobility aids) if they are needed. These include: Canes. Walkers. Scooters. Crutches. Turn on the lights when you go into a dark area. Replace any light bulbs as soon as they burn out. Set up your furniture so you have a clear path. Avoid moving your furniture around. If any of your floors are uneven, fix them. If there are any pets around you, be aware of where they are. Review your medicines with your doctor. Some medicines can make you feel dizzy. This can increase your chance of falling. Ask your doctor what other things that you can do to help prevent falls. This information is not intended to replace advice given to you by your health care provider. Make sure you discuss any questions you have with your health care provider. Document Released: 12/22/2008 Document Revised: 08/03/2015 Document Reviewed: 04/01/2014 Elsevier Interactive Patient Education  2017 [employer].

## 2020-09-22 ENCOUNTER — Ambulatory Visit: Payer: Medicare Other

## 2020-11-02 ENCOUNTER — Other Ambulatory Visit: Payer: Self-pay

## 2020-11-02 ENCOUNTER — Ambulatory Visit (INDEPENDENT_AMBULATORY_CARE_PROVIDER_SITE_OTHER): Payer: Medicare Other | Admitting: Family Medicine

## 2020-11-02 VITALS — BP 134/66 | HR 65 | Temp 97.9°F | Resp 16 | Wt 181.4 lb

## 2020-11-02 DIAGNOSIS — I10 Essential (primary) hypertension: Secondary | ICD-10-CM

## 2020-11-02 DIAGNOSIS — Z23 Encounter for immunization: Secondary | ICD-10-CM | POA: Diagnosis not present

## 2020-11-02 DIAGNOSIS — I2510 Atherosclerotic heart disease of native coronary artery without angina pectoris: Secondary | ICD-10-CM | POA: Diagnosis not present

## 2020-11-02 DIAGNOSIS — S61411A Laceration without foreign body of right hand, initial encounter: Secondary | ICD-10-CM | POA: Diagnosis not present

## 2020-11-02 DIAGNOSIS — K439 Ventral hernia without obstruction or gangrene: Secondary | ICD-10-CM

## 2020-11-02 DIAGNOSIS — E782 Mixed hyperlipidemia: Secondary | ICD-10-CM

## 2020-11-02 DIAGNOSIS — R739 Hyperglycemia, unspecified: Secondary | ICD-10-CM

## 2020-11-02 LAB — COMPREHENSIVE METABOLIC PANEL
ALT: 19 U/L (ref 0–53)
AST: 18 U/L (ref 0–37)
Albumin: 4.3 g/dL (ref 3.5–5.2)
Alkaline Phosphatase: 71 U/L (ref 39–117)
BUN: 12 mg/dL (ref 6–23)
CO2: 27 mEq/L (ref 19–32)
Calcium: 9.2 mg/dL (ref 8.4–10.5)
Chloride: 104 mEq/L (ref 96–112)
Creatinine, Ser: 0.85 mg/dL (ref 0.40–1.50)
GFR: 85.09 mL/min (ref >60.00)
Glucose, Bld: 86 mg/dL (ref 70–99)
Potassium: 4.1 mEq/L (ref 3.5–5.1)
Sodium: 138 mEq/L (ref 135–145)
Total Bilirubin: 0.9 mg/dL (ref 0.2–1.2)
Total Protein: 7 g/dL (ref 6.0–8.3)

## 2020-11-02 LAB — LIPID PANEL
Cholesterol: 95 mg/dL (ref 0–200)
HDL: 34.4 mg/dL — ABNORMAL LOW (ref >39.00)
LDL Cholesterol: 31 mg/dL (ref 0–99)
NonHDL: 60.14
Total CHOL/HDL Ratio: 3
Triglycerides: 148 mg/dL (ref 0.0–149.0)
VLDL: 29.6 mg/dL (ref 0.0–40.0)

## 2020-11-02 LAB — TSH: TSH: 3.2 u[IU]/mL (ref 0.35–5.50)

## 2020-11-02 LAB — CBC
HCT: 41.5 % (ref 39.0–52.0)
Hemoglobin: 14.2 g/dL (ref 13.0–17.0)
MCHC: 34.3 g/dL (ref 30.0–36.0)
MCV: 93 fl (ref 78.0–100.0)
Platelets: 212 10*3/uL (ref 150.0–400.0)
RBC: 4.47 Mil/uL (ref 4.22–5.81)
RDW: 13 % (ref 11.5–15.5)
WBC: 6.7 10*3/uL (ref 4.0–10.5)

## 2020-11-02 LAB — HEMOGLOBIN A1C: Hgb A1c MFr Bld: 6.5 % (ref 4.6–6.5)

## 2020-11-02 NOTE — Assessment & Plan Note (Signed)
Well controlled, no changes to meds. Encouraged heart healthy diet such as the DASH diet and exercise as tolerated.  °

## 2020-11-02 NOTE — Progress Notes (Signed)
Patient ID: Anthony [name], male    DOB: 03-15-1945  Age: 75 y.o. MRN: [id number]    Subjective:  Subjective  HPI SIDH LENOX presents for office visit today for follow up on hyperlipidemia and htn. He reports having numbness local to his medial side of his right leg due to bulging disc that he sees neurology for. He states that he has a planned visit for neurology in December. He plans on seeing Dr. Stanford Breed in January. Denies CP/palp/SOB/HA/congestion/fevers/GI or GU c/o. Taking meds as prescribed. He states that in his last visit to his gi a hernia was found. It was also found by their PT specialist.   Review of Systems  Constitutional:  Negative for chills, fatigue and fever.  HENT:  Negative for congestion, rhinorrhea, sinus pressure, sinus pain, sore throat and trouble swallowing.   Eyes:  Negative for pain.  Respiratory:  Negative for cough and shortness of breath.   Cardiovascular:  Negative for chest pain, palpitations and leg swelling.  Gastrointestinal:  Negative for abdominal pain, blood in stool, diarrhea, nausea and vomiting.  Genitourinary:  Negative for flank pain, frequency and penile pain.  Musculoskeletal:  Negative for back pain.  Skin:  Positive for wound (right hand laceration).  Neurological:  Positive for numbness (locan to medial side of right leg due to bulging disc). Negative for headaches.   History Past Medical History:  Diagnosis Date   Allergic rhinitis    BPH (benign prostatic hyperplasia)    CAD (coronary artery disease) 05/26/2009 :  primary cardiolgoist-  dr Stanford Breed   hx NSTEMI cardiac catherization  revealed-left main normal; LAD proximal calcification,  long proximal 30% stenosis,  Circumflex  AV groove   proximal  60% stenosis at mid obtuse marginal,  first large mid obtuse marginal  ostial 70% stenosis,  prox. to mid RCA  99% stenosis w/ intervention PCI and DES   Carotid stenosis, asymptomatic, bilateral    per last duplex 03-31-2015   bilateral ICA 1-39%   Cataract    removed   External hemorrhoids    large external   GERD (gastroesophageal reflux disease)    History of adenomatous polyp of colon    History of kidney stones    History of non-ST elevation myocardial infarction (NSTEMI) 05/26/2009   Hyperlipidemia    Hypertension    8-10 years   Left ureteral stone    Macular degeneration    Myocardial infarction [facility])    Nephrolithiasis    right side non-obstructive per CT 01-03-2017   S/P drug eluting coronary stent placement 05/26/2009   DESx1  to pRCA   Wears glasses     He has a past surgical history that includes Colonoscopy (last one 06-10-2016); Nephrolithotomy (Left, 09/29/2013); Cystoscopy w/ retrogrades (Bilateral, 09/29/2013); Ureteroscopy (N/A, 09/29/2013); Cystoscopy with retrograde pyelogram, ureteroscopy and stent placement (N/A, 06/14/2015); Holmium laser application (N/A, 0000000); Cataract extraction w/ intraocular lens  implant, bilateral (2010); Coronary angioplasty with stent (05-26-2009  dr hochrein/ dr Darnell Level brodie); Cardiovascular stress test (12/26/2011   dr Stanford Breed); Cystoscopy/ureteroscopy/holmium laser/stent placement (Left, 01/07/2017); Holmium laser application (Left, [id number]); and Cystoscopy/ureteroscopy/holmium laser/stent placement (Right, 01/12/2020).   His family history includes Coronary artery disease in his father; GER disease in his sister; Hearing loss in his father; Heart disease in his maternal grandfather, maternal grandmother, mother, paternal grandfather, and paternal grandmother; Hyperlipidemia in his father and mother; Hypertension in his father and mother; Multiple sclerosis in his son.He reports that he quit smoking about 13 years ago. His  smoking use included cigarettes. He has never used smokeless tobacco. He reports that he does not drink alcohol and does not use drugs.  Current Outpatient Medications on File Prior to Visit  Medication Sig Dispense Refill   amLODipine  (NORVASC) 10 MG tablet TAKE 1 TABLET BY MOUTH IN  THE MORNING 90 tablet 3   ascorbic acid (VITAMIN C) 500 MG tablet Take 500 mg by mouth daily.     aspirin 81 MG tablet Take 81 mg by mouth every morning.     gabapentin (NEURONTIN) 300 MG capsule Take 300 mg by mouth 2 (two) times daily. One capsule at lunch and two capsule at bedtime     losartan (COZAAR) 100 MG tablet TAKE 1 TABLET BY MOUTH IN  THE MORNING 90 tablet 3   metoprolol tartrate (LOPRESSOR) 25 MG tablet TAKE ONE-HALF TABLET BY  MOUTH TWICE DAILY 90 tablet 3   Multiple Vitamins-Minerals (ICAPS PO) Take by mouth every morning.      potassium citrate (UROCIT-K) 10 MEQ (1080 MG) SR tablet Take 10 mEq by mouth in the morning and at bedtime.   3   rosuvastatin (CRESTOR) 40 MG tablet TAKE 1 TABLET BY MOUTH  DAILY 90 tablet 3   No current facility-administered medications on file prior to visit.     Objective:  Objective  Physical Exam Constitutional:      General: He is not in acute distress.    Appearance: Normal appearance. He is not ill-appearing or toxic-appearing.  HENT:     Head: Normocephalic and atraumatic.     Right Ear: Tympanic membrane, ear canal and external ear normal.     Left Ear: Tympanic membrane, ear canal and external ear normal.     Nose: No congestion or rhinorrhea.  Eyes:     Extraocular Movements: Extraocular movements intact.     Pupils: Pupils are equal, round, and reactive to light.  Cardiovascular:     Rate and Rhythm: Normal rate and regular rhythm.     Pulses: Normal pulses.     Heart sounds: Normal heart sounds. No murmur heard. Pulmonary:     Effort: Pulmonary effort is normal. No respiratory distress.     Breath sounds: Normal breath sounds. No wheezing, rhonchi or rales.  Abdominal:     General: Bowel sounds are normal.     Palpations: Abdomen is soft. There is no mass.     Tenderness: no abdominal tenderness There is no guarding.     Hernia: A hernia (abdominal wall) is present.   Musculoskeletal:        General: Normal range of motion.     Cervical back: Normal range of motion and neck supple.  Skin:    General: Skin is warm and dry.  Neurological:     Mental Status: He is alert and oriented to person, place, and time.  Psychiatric:        Behavior: Behavior normal.   BP 134/66   Pulse 65   Temp 97.9 F (36.6 C)   Resp 16   Wt 181 lb 6.4 oz (82.3 kg)   SpO2 96%   BMI 29.28 kg/m  Wt Readings from Last 3 Encounters:  11/02/20 181 lb 6.4 oz (82.3 kg)  09/14/20 181 lb (82.1 kg)  05/16/20 181 lb (82.1 kg)     Lab Results  Component Value Date   WBC 6.7 11/02/2020   HGB 14.2 11/02/2020   HCT 41.5 11/02/2020   PLT 212.0 11/02/2020   GLUCOSE 86  11/02/2020   CHOL 95 11/02/2020   TRIG 148.0 11/02/2020   HDL 34.40 (L) 11/02/2020   LDLDIRECT 41.0 04/20/2019   LDLCALC 31 11/02/2020   ALT 19 11/02/2020   AST 18 11/02/2020   NA 138 11/02/2020   K 4.1 11/02/2020   CL 104 11/02/2020   CREATININE 0.85 11/02/2020   BUN 12 11/02/2020   CO2 27 11/02/2020   TSH 3.20 11/02/2020   PSA 1.70ng/mL 02/19/2018   INR 1.05 05/25/2009   HGBA1C 6.5 11/02/2020    No results found.   Assessment & Plan:  Plan    No orders of the defined types were placed in this encounter.   Problem List Items Addressed This Visit     Hyperlipidemia, mixed    Encourage heart healthy diet such as MIND or DASH diet, increase exercise, avoid trans fats, simple carbohydrates and processed foods, consider a krill or fish or flaxseed oil cap daily. Tolerating Rosuvastatin      Relevant Orders   Lipid panel (Completed)   Hypertension    Well controlled, no changes to meds. Encouraged heart healthy diet such as the DASH diet and exercise as tolerated.       Relevant Orders   CBC (Completed)   Comprehensive metabolic panel (Completed)   TSH (Completed)   Hyperglycemia    hgba1c acceptable, minimize simple carbs. Increase exercise as tolerated.       Relevant Orders    Hemoglobin A1c (Completed)   Laceration of hand, right    Hurt his hand yesterday working on his tractor given Tdap today      Hernia of abdominal wall    Midline just below the epigastrium, asymptomatic, he is to report if it worsens or becomes larger.       Other Visit Diagnoses     Need for Tdap vaccination    -  Primary   Relevant Orders   Tdap vaccine greater than or equal to 7yo IM (Completed)       Follow-up: Return in about 6 months (around 05/05/2021) for annual exam.  I, Suezanne Jacquet, acting as a scribe for Penni Homans, MD, have documented all relevent documentation on behalf of Penni Homans, MD, as directed by Penni Homans, MD while in the presence of Penni Homans, MD.  I, [name], MD personally performed the services described in this documentation. All medical record entries made by the scribe were at my direction and in my presence. I have reviewed the chart and agree that the record reflects my personal performance and is accurate and complete

## 2020-11-02 NOTE — Patient Instructions (Addendum)
Paxlovid is the new COVID medication we can give you if you get COVID so make sure you test if you have symptoms because we have to treat by day 5 of symptoms for it to be effective. If you are positive let us know so we can treat. If a home test is negative and your symptoms are persistent get a PCR test. Can check testing locations at [email or web address] If you are positive we will make an appointment with [place] and we will send in Paxlovid if you would like it. Check with your pharmacy before we meet to confirm they have it in stock, if they do not then we can get the prescription at the   [place] Hypertension, Adult High blood pressure (hypertension) is when the force of blood pumping through the arteries is too strong. The arteries are the blood vessels that carry blood from the heart throughout the body. Hypertension forces the heart to work harder to pump blood and may cause arteries to become narrow or stiff. Untreated or uncontrolled hypertension can cause a heart attack, heart failure, a stroke, kidney disease, and otherproblems. A blood pressure reading consists of a higher number over a lower number. Ideally, your blood pressure should be below 120/80. The first ("top") number is called the systolic pressure. It is a measure of the pressure in your arteries as your heart beats. The second ("bottom") number is called the diastolic pressure. It is a measure of the pressure in your arteries as theheart relaxes. What are the causes? The exact cause of this condition is not known. There are some conditions thatresult in or are related to high blood pressure. What increases the risk? Some risk factors for high blood pressure are under your control. The following factors may make you more likely to develop this condition: Smoking. Having type 2 diabetes mellitus, high cholesterol, or both. Not getting enough exercise or physical activity. Being overweight. Having too much fat,  sugar, calories, or salt (sodium) in your diet. Drinking too much alcohol. Some risk factors for high blood pressure may be difficult or impossible to change. Some of these factors include: Having chronic kidney disease. Having a family history of high blood pressure. Age. Risk increases with age. Race. You may be at higher risk if you are African American. Gender. Men are at higher risk than women before age 33. After age 72, women are at higher risk than men. Having obstructive sleep apnea. Stress. What are the signs or symptoms? High blood pressure may not cause symptoms. Very high blood pressure (hypertensive crisis) may cause: Headache. Anxiety. Shortness of breath. Nosebleed. Nausea and vomiting. Vision changes. Severe chest pain. Seizures. How is this diagnosed? This condition is diagnosed by measuring your blood pressure while you are seated, with your arm resting on a flat surface, your legs uncrossed, and your feet flat on the floor. The cuff of the blood pressure monitor will be placed directly against the skin of your upper arm at the level of your heart. It should be measured at least twice using the same arm. Certain conditions cancause a difference in blood pressure between your right and left arms. Certain factors can cause blood pressure readings to be lower or higher than normal for a short period of time: When your blood pressure is higher when you are in a health care provider's office than when you are at home, this is called white coat hypertension. Most people with this condition do not need medicines. When  your blood pressure is higher at home than when you are in a health care provider's office, this is called masked hypertension. Most people with this condition may need medicines to control blood pressure. If you have a high blood pressure reading during one visit or you have normal blood pressure with other risk factors, you may be asked to: Return on a different  day to have your blood pressure checked again. Monitor your blood pressure at home for 1 week or longer. If you are diagnosed with hypertension, you may have other blood or imaging tests to help your health care provider understand your overall risk for otherconditions. How is this treated? This condition is treated by making healthy lifestyle changes, such as eating healthy foods, exercising more, and reducing your alcohol intake. Your health care provider may prescribe medicine if lifestyle changes are not enough to get your blood pressure under control, and if: Your systolic blood pressure is above 130. Your diastolic blood pressure is above 80. Your personal target blood pressure may vary depending on your medicalconditions, your age, and other factors. Follow these instructions at home: Eating and drinking  Eat a diet that is high in fiber and potassium, and low in sodium, added sugar, and fat. An example eating plan is called the DASH (Dietary Approaches to Stop Hypertension) diet. To eat this way: Eat plenty of fresh fruits and vegetables. Try to fill one half of your plate at each meal with fruits and vegetables. Eat whole grains, such as whole-wheat pasta, brown rice, or whole-grain bread. Fill about one fourth of your plate with whole grains. Eat or drink low-fat dairy products, such as skim milk or low-fat yogurt. Avoid fatty cuts of meat, processed or cured meats, and poultry with skin. Fill about one fourth of your plate with lean proteins, such as fish, chicken without skin, beans, eggs, or tofu. Avoid pre-made and processed foods. These tend to be higher in sodium, added sugar, and fat. Reduce your daily sodium intake. Most people with hypertension should eat less than 1,500 mg of sodium a day. Do not drink alcohol if: Your health care provider tells you not to drink. You are pregnant, may be pregnant, or are planning to become pregnant. If you drink alcohol: Limit how much you  use to: 0-1 drink a day for women. 0-2 drinks a day for men. Be aware of how much alcohol is in your drink. In the [place] one drink equals one 12 oz bottle of beer (355 mL), one 5 oz glass of wine (148 mL), or one 1 oz glass of hard liquor (44 mL).  Lifestyle  Work with your health care provider to maintain a healthy body weight or to lose weight. Ask what an ideal weight is for you. Get at least 30 minutes of exercise most days of the week. Activities may include walking, swimming, or biking. Include exercise to strengthen your muscles (resistance exercise), such as Pilates or lifting weights, as part of your weekly exercise routine. Try to do these types of exercises for 30 minutes at least 3 days a week. Do not use any products that contain nicotine or tobacco, such as cigarettes, e-cigarettes, and chewing tobacco. If you need help quitting, ask your health care provider. Monitor your blood pressure at home as told by your health care provider. Keep all follow-up visits as told by your health care provider. This is important.  Medicines Take over-the-counter and prescription medicines only as told by your health care provider.  Follow directions carefully. Blood pressure medicines must be taken as prescribed. Do not skip doses of blood pressure medicine. Doing this puts you at risk for problems and can make the medicine less effective. Ask your health care provider about side effects or reactions to medicines that you should watch for. Contact a health care provider if you: Think you are having a reaction to a medicine you are taking. Have headaches that keep coming back (recurring). Feel dizzy. Have swelling in your ankles. Have trouble with your vision. Get help right away if you: Develop a severe headache or confusion. Have unusual weakness or numbness. Feel faint. Have severe pain in your chest or abdomen. Vomit repeatedly. Have trouble breathing. Summary Hypertension is when  the force of blood pumping through your arteries is too strong. If this condition is not controlled, it may put you at risk for serious complications. Your personal target blood pressure may vary depending on your medical conditions, your age, and other factors. For most people, a normal blood pressure is less than 120/80. Hypertension is treated with lifestyle changes, medicines, or a combination of both. Lifestyle changes include losing weight, eating a healthy, low-sodium diet, exercising more, and limiting alcohol. This information is not intended to replace advice given to you by your health care provider. Make sure you discuss any questions you have with your healthcare provider. Document Revised: 11/05/2017 Document Reviewed: 11/05/2017 Elsevier Patient Education  [place].

## 2020-11-02 NOTE — Assessment & Plan Note (Signed)
hgba1c acceptable, minimize simple carbs. Increase exercise as tolerated.  

## 2020-11-02 NOTE — Assessment & Plan Note (Signed)
Hurt his hand yesterday working on his tractor given Tdap today

## 2020-11-02 NOTE — Assessment & Plan Note (Signed)
Encourage heart healthy diet such as MIND or DASH diet, increase exercise, avoid trans fats, simple carbohydrates and processed foods, consider a krill or fish or flaxseed oil cap daily.  Tolerating Rosuvastatin 

## 2020-11-05 DIAGNOSIS — K439 Ventral hernia without obstruction or gangrene: Secondary | ICD-10-CM | POA: Insufficient documentation

## 2020-11-05 NOTE — Assessment & Plan Note (Signed)
Midline just below the epigastrium, asymptomatic, he is to report if it worsens or becomes larger.

## 2020-11-21 DIAGNOSIS — H35371 Puckering of macula, right eye: Secondary | ICD-10-CM | POA: Diagnosis not present

## 2020-11-21 DIAGNOSIS — H35721 Serous detachment of retinal pigment epithelium, right eye: Secondary | ICD-10-CM | POA: Diagnosis not present

## 2020-11-21 DIAGNOSIS — Z961 Presence of intraocular lens: Secondary | ICD-10-CM | POA: Diagnosis not present

## 2020-11-21 DIAGNOSIS — H353131 Nonexudative age-related macular degeneration, bilateral, early dry stage: Secondary | ICD-10-CM | POA: Diagnosis not present

## 2020-12-01 ENCOUNTER — Ambulatory Visit (INDEPENDENT_AMBULATORY_CARE_PROVIDER_SITE_OTHER): Payer: Medicare Other

## 2020-12-01 ENCOUNTER — Other Ambulatory Visit: Payer: Self-pay

## 2020-12-01 DIAGNOSIS — Z23 Encounter for immunization: Secondary | ICD-10-CM | POA: Diagnosis not present

## 2020-12-13 DIAGNOSIS — M5416 Radiculopathy, lumbar region: Secondary | ICD-10-CM | POA: Diagnosis not present

## 2021-01-23 ENCOUNTER — Other Ambulatory Visit: Payer: Self-pay | Admitting: Cardiology

## 2021-01-23 DIAGNOSIS — I2510 Atherosclerotic heart disease of native coronary artery without angina pectoris: Secondary | ICD-10-CM

## 2021-03-01 DIAGNOSIS — N200 Calculus of kidney: Secondary | ICD-10-CM | POA: Diagnosis not present

## 2021-03-01 DIAGNOSIS — Z87442 Personal history of urinary calculi: Secondary | ICD-10-CM | POA: Diagnosis not present

## 2021-03-01 DIAGNOSIS — N201 Calculus of ureter: Secondary | ICD-10-CM | POA: Diagnosis not present

## 2021-03-01 DIAGNOSIS — N400 Enlarged prostate without lower urinary tract symptoms: Secondary | ICD-10-CM | POA: Diagnosis not present

## 2021-03-11 NOTE — Progress Notes (Signed)
HPI:FU CAD; admitted in March 2011 with a non-ST elevation myocardial infarction. He underwent cardiac catheterization which revealed - Left main was normal. The LAD had proximal calcification. There was long proximal 30% stenosis. Circumflex in the AV groove had proximal 60% stenosis at a mid obtuse marginal. This first large mid obtuse marginal had ostial 70% stenosis. The right coronary artery had a proximal long 50% stenosis. There was mid subtotal stenosis. The EF was 50% with inferior hypokinesis. Patient had a drug-eluting stent to the right coronary artery at that time. Nuclear study in December of 2013 showed an ejection fraction of 71% and normal perfusion. Carotid Dopplers in Jan 2017 showed 1-39 bilateral stenosis.  Abdominal ultrasound January 2020 showed no aneurysm. Venous Dopplers right lower extremity September 2021 showed no DVT.  Since I last saw him, he denies dyspnea, chest pain, palpitations or syncope.  Current Outpatient Medications  Medication Sig Dispense Refill   amLODipine (NORVASC) 10 MG tablet TAKE 1 TABLET BY MOUTH IN  THE MORNING 90 tablet 3   ascorbic acid (VITAMIN C) 500 MG tablet Take 500 mg by mouth daily.     aspirin 81 MG tablet Take 81 mg by mouth every morning.     gabapentin (NEURONTIN) 300 MG capsule Take 300 mg by mouth 2 (two) times daily. One capsule at lunch and two capsule at bedtime     losartan (COZAAR) 100 MG tablet TAKE 1 TABLET BY MOUTH IN  THE MORNING 90 tablet 3   metoprolol tartrate (LOPRESSOR) 25 MG tablet TAKE ONE-HALF TABLET BY  MOUTH TWICE DAILY 90 tablet 3   Multiple Vitamins-Minerals (ICAPS PO) Take by mouth every morning.      potassium citrate (UROCIT-K) 10 MEQ (1080 MG) SR tablet Take 10 mEq by mouth in the morning and at bedtime.   3   rosuvastatin (CRESTOR) 40 MG tablet TAKE 1 TABLET BY MOUTH  DAILY 90 tablet 3   No current facility-administered medications for this visit.     Past Medical History:  Diagnosis Date    Allergic rhinitis    BPH (benign prostatic hyperplasia)    CAD (coronary artery disease) 05/26/2009 :  primary cardiolgoist-  dr Stanford Breed   hx NSTEMI cardiac catherization  revealed-left main normal; LAD proximal calcification,  long proximal 30% stenosis,  Circumflex  AV groove   proximal  60% stenosis at mid obtuse marginal,  first large mid obtuse marginal  ostial 70% stenosis,  prox. to mid RCA  99% stenosis w/ intervention PCI and DES   Carotid stenosis, asymptomatic, bilateral    per last duplex 03-31-2015  bilateral ICA 1-39%   Cataract    removed   External hemorrhoids    large external   GERD (gastroesophageal reflux disease)    History of adenomatous polyp of colon    History of kidney stones    History of non-ST elevation myocardial infarction (NSTEMI) 05/26/2009   Hyperlipidemia    Hypertension    8-10 years   Left ureteral stone    Macular degeneration    Myocardial infarction [facility])    Nephrolithiasis    right side non-obstructive per CT 01-03-2017   S/P drug eluting coronary stent placement 05/26/2009   DESx1  to pRCA   Wears glasses     Past Surgical History:  Procedure Laterality Date   CARDIOVASCULAR STRESS TEST  12/26/2011   dr Stanford Breed   normal nuclear study w/ no ischemia/  normal LV function and wall motion ,  ef 71%   CATARACT EXTRACTION W/ INTRAOCULAR LENS  IMPLANT, BILATERAL  2010   COLONOSCOPY  last one 06-10-2016   CORONARY ANGIOPLASTY WITH STENT PLACEMENT  05-26-2009  dr hochrein/ dr Darnell Level brodie   Severe RCA stenosis and moderate stenosis in the left system, ef 50% with inferior hypokinsis:  PCI and DES x1 to RCA ([facility])   CYSTOSCOPY W/ RETROGRADES Bilateral 09/29/2013   Procedure: CYSTOSCOPY WITH RETROGRADE PYELOGRAM;  Surgeon: Alexis Frock, MD;  Location: [facility];  Service: Urology;  Laterality: Bilateral;   CYSTOSCOPY WITH RETROGRADE PYELOGRAM, URETEROSCOPY AND STENT PLACEMENT N/A 06/14/2015   Procedure: CYSTOSCOPY WITH RETROGRADE PYELOGRAM, cysto  litholapexy ;  Surgeon: Alexis Frock, MD;  Location[facility];  Service: Urology;  Laterality: N/A;   CYSTOSCOPY/URETEROSCOPY/HOLMIUM LASER/STENT PLACEMENT Left 01/07/2017   Procedure: CYSTOSCOPY/RETROGRADE/URETEROSCOPY/HOLMIUM LASER/STENT PLACEMENT, FULGERATION OF PROSTATIC URETHRA;  Surgeon: Ceasar Mons, MD;  [facility];  Service: Urology;  Laterality: Left;   CYSTOSCOPY/URETEROSCOPY/HOLMIUM LASER/STENT PLACEMENT Right 01/12/2020   Procedure: CYSTOSCOPY/ RIGHT RETROGRADE/ RIGHT URETEROSCOPY/ RIGHT HOLMIUM LASER/ RIGHT STENT PLACEMENT;  Surgeon: Ceasar Mons, MD;  Location: [facility];  Service: Urology;  Laterality: Right;  ONLY NEEDS 60 MIN   HOLMIUM LASER APPLICATION N/A 04/17/8339   Procedure: HOLMIUM LASER APPLICATION bladder stone;  Surgeon: Alexis Frock, MD;  Location: [facility];  Service: Urology;  Laterality: N/A;   HOLMIUM LASER APPLICATION Left 96/22/2979   Procedure: HOLMIUM LASER APPLICATION;  Surgeon: Ceasar Mons, MD;  Location: [facility];  Service: Urology;  Laterality: Left;   NEPHROLITHOTOMY Left 09/29/2013   Procedure: NEPHROLITHOTOMY PERCUTANEOUS WITH ACCESS;  Surgeon: Alexis Frock, MD;  Location: [facility];  Service: Urology;  Laterality: Left;   URETEROSCOPY N/A 09/29/2013   Procedure: URETEROSCOPY;  Surgeon: Alexis Frock, MD;  Location: [facility];  Service: Urology;  Laterality: N/A;    Social History   Socioeconomic History   Marital status: Married    Spouse name: Not on file   Number of children: Not on file   Years of education: Not on file   Highest education level: Not on file  Occupational History   Not on file  Tobacco Use   Smoking status: Former    Years: 45.00    Types: Cigarettes    Quit date: 03/12/2007    Years since quitting: 14.0   Smokeless tobacco: Never  Substance and Sexual Activity   Alcohol use: No   Drug use:  No   Sexual activity: Yes    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  He had     a 40 pack-year smoking history but has discontinued.     prev occuptation -  [employer]   Alcohol use-no   Smoking Status:  quit   Packs/Day:  0.5   Caffeine use/day:  2 beverages daily   Does Patient Exercise:  yes            Social Determinants of Health   Financial Resource Strain: Low Risk    Difficulty of Paying Living Expenses: Not hard at all  Food Insecurity: No Food Insecurity   Worried About Charity fundraiser in the Last Year: Never true  Ran Out of Food in the Last Year: Never true  Transportation Needs: No Transportation Needs   Lack of Transportation (Medical): No   Lack of Transportation (Non-Medical): No  Physical Activity: Sufficiently Active   Days of Exercise per Week: 7 days   Minutes of Exercise per Session: 30 min  Stress: No Stress Concern Present   Feeling of Stress : Not at all  Social Connections: Moderately Isolated   Frequency of Communication with Friends and Family: More than three times a week   Frequency of Social Gatherings with Friends and Family: More than three times a week   Attends Religious Services: Never   Marine scientist or Organizations: No   Attends Music therapist: Never   Marital Status: Married  Human resources officer Violence: Not At Risk   Fear of Current or Ex-Partner: No   Emotionally Abused: No   Physically Abused: No   Sexually Abused: No    Family History  Problem Relation Age of Onset   Coronary artery disease Father        in his 69's   Hyperlipidemia Father    Hypertension Father    Hearing loss Father    Heart disease Mother        deceased of heart disease   Hypertension Mother     Hyperlipidemia Mother    Multiple sclerosis Son    Heart disease Maternal Grandmother    Heart disease Maternal Grandfather    Heart disease Paternal Grandmother    Heart disease Paternal Grandfather    GER disease Sister    Other Neg Hx        no lung cancer, colon cancer, or prostate   Colon cancer Neg Hx    Stomach cancer Neg Hx    Esophageal cancer Neg Hx     ROS: no fevers or chills, productive cough, hemoptysis, dysphasia, odynophagia, melena, hematochezia, dysuria, hematuria, rash, seizure activity, orthopnea, PND, pedal edema, claudication. Remaining systems are negative.  Physical Exam: Well-developed well-nourished in no acute distress.  Skin is warm and dry.  HEENT is normal.  Neck is supple.  Chest is clear to auscultation with normal expansion.  Cardiovascular exam is regular rate and rhythm.  Abdominal exam nontender or distended. No masses palpated. Extremities show no edema. neuro grossly intact  ECG-normal sinus rhythm at a rate of 61, cannot rule out prior inferior infarct.  First-degree AV block.  Personally reviewed  A/P  1 coronary artery disease-plan to continue medical therapy with aspirin and statin.  He is not having chest pain.  2 hypertension-blood pressure mildly elevated and also mildly elevated at home.  Add HCTZ 12.5 mg daily and follow.  Check potassium and renal function in 1 week.  3 hyperlipidemia-continue statin.  LDL August 2022 31 and liver functions normal.  4 history of carotid disease-mild on most recent Dopplers.  Kirk Ruths, MD

## 2021-03-21 ENCOUNTER — Ambulatory Visit (INDEPENDENT_AMBULATORY_CARE_PROVIDER_SITE_OTHER): Payer: Medicare Other | Admitting: Cardiology

## 2021-03-21 ENCOUNTER — Other Ambulatory Visit (HOSPITAL_BASED_OUTPATIENT_CLINIC_OR_DEPARTMENT_OTHER): Payer: Self-pay

## 2021-03-21 ENCOUNTER — Other Ambulatory Visit: Payer: Self-pay

## 2021-03-21 ENCOUNTER — Encounter: Payer: Self-pay | Admitting: Cardiology

## 2021-03-21 VITALS — BP 136/74 | HR 61 | Ht 66.0 in | Wt 176.4 lb

## 2021-03-21 DIAGNOSIS — E7800 Pure hypercholesterolemia, unspecified: Secondary | ICD-10-CM

## 2021-03-21 DIAGNOSIS — I25119 Atherosclerotic heart disease of native coronary artery with unspecified angina pectoris: Secondary | ICD-10-CM | POA: Diagnosis not present

## 2021-03-21 DIAGNOSIS — I10 Essential (primary) hypertension: Secondary | ICD-10-CM | POA: Diagnosis not present

## 2021-03-21 DIAGNOSIS — I679 Cerebrovascular disease, unspecified: Secondary | ICD-10-CM | POA: Diagnosis not present

## 2021-03-21 MED ORDER — HYDROCHLOROTHIAZIDE 12.5 MG PO CAPS
12.5000 mg | ORAL_CAPSULE | Freq: Every day | ORAL | 3 refills | Status: DC
  Filled 2021-03-21: qty 90, 90d supply, fill #0

## 2021-03-21 NOTE — Patient Instructions (Signed)
Medication Instructions:   START HCTZ 12.5 MG ONCE DAILY  *If you need a refill on your cardiac medications before your next appointment, please call your pharmacy*   Lab Work:  Your physician recommends that you return for lab work in: [place]  If you have labs (blood work) drawn today and your tests are completely normal, you will receive your results only by: [employer] (if you have MyChart) OR A paper copy in the mail If you have any lab test that is abnormal or we need to change your treatment, we will call you to review the results.   Follow-Up: At [facility], you and your health needs are our priority.  As part of our continuing mission to provide you with exceptional heart care, we have created designated Provider Care Teams.  These Care Teams include your primary Cardiologist (physician) and Advanced Practice Providers (APPs -  Physician Assistants and Nurse Practitioners) who all work together to provide you with the care you need, when you need it.  We recommend signing up for the patient portal called "MyChart".  Sign up information is provided on this After Visit Summary.  MyChart is used to connect with patients for Virtual Visits (Telemedicine).  Patients are able to view lab/test results, encounter notes, upcoming appointments, etc.  Non-urgent messages can be sent to your provider as well.   To learn more about what you can do with MyChart, go to [email or web address].    Your next appointment:   12 month(s)  The format for your next appointment:   In Person  Provider:   Kirk Ruths MD

## 2021-03-28 DIAGNOSIS — I10 Essential (primary) hypertension: Secondary | ICD-10-CM | POA: Diagnosis not present

## 2021-03-29 LAB — BASIC METABOLIC PANEL
BUN/Creatinine Ratio: 14 (ref 10–24)
BUN: 13 mg/dL (ref 8–27)
CO2: 23 mmol/L (ref 20–29)
Calcium: 9 mg/dL (ref 8.6–10.2)
Chloride: 101 mmol/L (ref 96–106)
Creatinine, Ser: 0.9 mg/dL (ref 0.76–1.27)
Glucose: 145 mg/dL — ABNORMAL HIGH (ref 70–99)
Potassium: 3.7 mmol/L (ref 3.5–5.2)
Sodium: 140 mmol/L (ref 134–144)
eGFR: 89 mL/min/{1.73_m2} (ref >59)

## 2021-04-10 ENCOUNTER — Telehealth: Payer: Self-pay | Admitting: Family Medicine

## 2021-04-10 NOTE — Telephone Encounter (Signed)
Pt states when he was given his tetanus shot, it was not coded correctly and he was charged full price. He stated he had a cut and the shot was given as a precaution. If the shot is put in as a necessity insurance will cover it. Please advise.

## 2021-04-14 ENCOUNTER — Other Ambulatory Visit: Payer: Self-pay | Admitting: Family Medicine

## 2021-04-14 DIAGNOSIS — I10 Essential (primary) hypertension: Secondary | ICD-10-CM

## 2021-04-17 NOTE — Telephone Encounter (Signed)
Added it to diagnosis code

## 2021-04-25 DIAGNOSIS — L918 Other hypertrophic disorders of the skin: Secondary | ICD-10-CM | POA: Diagnosis not present

## 2021-04-25 DIAGNOSIS — L820 Inflamed seborrheic keratosis: Secondary | ICD-10-CM | POA: Diagnosis not present

## 2021-05-08 DIAGNOSIS — R1084 Generalized abdominal pain: Secondary | ICD-10-CM | POA: Diagnosis not present

## 2021-05-08 DIAGNOSIS — N200 Calculus of kidney: Secondary | ICD-10-CM | POA: Diagnosis not present

## 2021-05-14 ENCOUNTER — Encounter: Payer: Self-pay | Admitting: Cardiology

## 2021-05-14 DIAGNOSIS — I10 Essential (primary) hypertension: Secondary | ICD-10-CM

## 2021-05-15 MED ORDER — HYDROCHLOROTHIAZIDE 12.5 MG PO CAPS: 12.5000 mg | ORAL_CAPSULE | Freq: Every day | ORAL | 2 refills | Status: DC

## 2021-05-23 DIAGNOSIS — N200 Calculus of kidney: Secondary | ICD-10-CM | POA: Diagnosis not present

## 2021-05-28 NOTE — Progress Notes (Signed)
? ?Subjective:  ? ? Patient ID: Anthony [name], male    DOB: 09/06/1945, 76 y.o.   MRN: [id number] ? ?Chief Complaint  ?Patient presents with  ? Annual Exam  ? ? ?HPI ?Patient is in today for his annual review and follow up on chronic medical concerns. No recent febrile illness or hospitalizations but he does have a tickle in his throat with cough this week since laying mulch in the yard. No fevers or chills no sputum production. He passed 7 kidney stones a few weeks back and was seen at [facility] urology but is feeling better now. Stays active and eat swell but acknowledges too many sweets. Denies CP/palp/SOB/HA/congestion/fevers/GI or GU c/o. Taking meds as prescribed  ? ?Past Medical History:  ?Diagnosis Date  ? Allergic rhinitis   ? BPH (benign prostatic hyperplasia)   ? CAD (coronary artery disease) 05/26/2009 :  primary cardiolgoist-  Anthony [name]  ? hx NSTEMI cardiac catherization  revealed-left main normal; LAD proximal calcification,  long proximal 30% stenosis,  Circumflex  AV groove   proximal  60% stenosis at mid obtuse marginal,  first large mid obtuse marginal  ostial 70% stenosis,  prox. to mid RCA  99% stenosis w/ intervention PCI and DES  ? Carotid stenosis, asymptomatic, bilateral   ? per last duplex 03-31-2015  bilateral ICA 1-39%  ? Cataract   ? removed  ? External hemorrhoids   ? large external  ? GERD (gastroesophageal reflux disease)   ? Glaucoma   ? History of adenomatous polyp of colon   ? History of kidney stones   ? History of non-ST elevation myocardial infarction (NSTEMI) 05/26/2009  ? Hyperlipidemia   ? Hypertension   ? 8-10 years  ? Left ureteral stone   ? Macular degeneration   ? Myocardial infarction [facility])   ? Nephrolithiasis   ? right side non-obstructive per CT 01-03-2017  ? S/P drug eluting coronary stent placement 05/26/2009  ? DESx1  to pRCA  ? Wears glasses   ? ? ?Past Surgical History:  ?Procedure Laterality Date  ? CARDIOVASCULAR STRESS TEST  12/26/2011   Anthony [name]  ? normal  nuclear study w/ no ischemia/  normal LV function and wall motion , ef 71%  ? CATARACT EXTRACTION W/ INTRAOCULAR LENS  IMPLANT, BILATERAL  2010  ? COLONOSCOPY  last one 06-10-2016  ? CORONARY ANGIOPLASTY WITH STENT PLACEMENT  05-26-2009  Anthony [name]/ Anthony Anthony [name]  ? Severe RCA stenosis and moderate stenosis in the left system, ef 50% with inferior hypokinsis:  PCI and DES x1 to RCA ([facility])  ? CYSTOSCOPY W/ RETROGRADES Bilateral 09/29/2013  ? Procedure: CYSTOSCOPY WITH RETROGRADE PYELOGRAM;  Surgeon: Anthony Frock, MD;  Location: [facility];  Service: Urology;  Laterality: Bilateral;  ? CYSTOSCOPY WITH RETROGRADE PYELOGRAM, URETEROSCOPY AND STENT PLACEMENT N/A 06/14/2015  ? Procedure: CYSTOSCOPY WITH RETROGRADE PYELOGRAM, cysto litholapexy ;  Surgeon: Anthony Frock, MD;  Location[facility];  Service: Urology;  Laterality: N/A;  ? CYSTOSCOPY/URETEROSCOPY/HOLMIUM LASER/STENT PLACEMENT Left 01/07/2017  ? Procedure: CYSTOSCOPY/RETROGRADE/URETEROSCOPY/HOLMIUM LASER/STENT PLACEMENT, FULGERATION OF PROSTATIC URETHRA;  Surgeon: Anthony Mons, MD;  Location: [facility];  Service: Urology;  Laterality: Left;  ? CYSTOSCOPY/URETEROSCOPY/HOLMIUM LASER/STENT PLACEMENT Right 01/12/2020  ? Procedure: CYSTOSCOPY/ RIGHT RETROGRADE/ RIGHT URETEROSCOPY/ RIGHT HOLMIUM LASER/ RIGHT STENT PLACEMENT;  Surgeon: Anthony Mons, MD;  Location: [facility];  Service: Urology;  Laterality: Right;  ONLY NEEDS 60 MIN  ? EYE SURGERY    ? HOLMIUM LASER  APPLICATION N/A 53/20/2334  ? Procedure: HOLMIUM LASER APPLICATION bladder stone;  Surgeon: Anthony Frock, MD;  Location: [facility];  Service: Urology;  Laterality: N/A;  ? HOLMIUM LASER APPLICATION Left 35/68/6168  ? Procedure: HOLMIUM LASER APPLICATION;  Surgeon: Anthony Mons, MD;  Location: [facility];  Service: Urology;  Laterality: Left;  ? NEPHROLITHOTOMY Left 09/29/2013  ?  Procedure: NEPHROLITHOTOMY PERCUTANEOUS WITH ACCESS;  Surgeon: Anthony Frock, MD;  Location: [facility];  Service: Urology;  Laterality: Left;  ? URETEROSCOPY N/A 09/29/2013  ? Procedure: URETEROSCOPY;  Surgeon: Anthony Frock, MD;  Location: [facility];  Service: Urology;  Laterality: N/A;  ? ? ?Family History  ?Problem Relation Age of Onset  ? Coronary artery disease Father   ?     in his 18's  ? Hyperlipidemia Father   ? Hypertension Father   ? Hearing loss Father   ? Heart disease Mother   ?     deceased of heart disease  ? Hypertension Mother   ? Hyperlipidemia Mother   ? Multiple sclerosis Son   ? Heart disease Son   ? Heart disease Maternal Grandmother   ? Heart disease Maternal Grandfather   ? Heart disease Paternal Grandmother   ? Heart disease Paternal Grandfather   ? GER disease Sister   ? Heart disease Paternal Aunt   ? Other Neg Hx   ?     no lung cancer, colon cancer, or prostate  ? Colon cancer Neg Hx   ? Stomach cancer Neg Hx   ? Esophageal cancer Neg Hx   ? ? ?Social History  ? ?Socioeconomic History  ? Marital status: Married  ?  Spouse name: Not on file  ? Number of children: Not on file  ? Years of education: Not on file  ? Highest education level: Not on file  ?Occupational History  ? Not on file  ?Tobacco Use  ? Smoking status: Former  ?  Years: 45.00  ?  Types: Cigarettes  ?  Quit date: 03/12/2007  ?  Years since quitting: 14.2  ? Smokeless tobacco: Never  ?Substance and Sexual Activity  ? Alcohol use: No  ? Drug use: No  ? Sexual activity: Yes  ?  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 Not on file  ?Social History Narrative  ? married -109 yrs  Anthony [name])  ? grew up in [place]  ? He has one son - two grandaugthers  ?   He is retired.  He had   ?  a 40 pack-year smoking history but has discontinued.  ?   prev occuptation -  [employer]  ? Alcohol use-no  ? Smoking Status:  quit  ? Packs/Day:  0.5  ? Caffeine use/day:  2 beverages  daily  ? Does Patient Exercise:  yes  ?   ?   ?   ? ?Social Determinants of Health  ? ?Financial Resource Strain: Low Risk   ? Difficulty of Paying Living Expenses: Not hard at all  ?Food Insecurity: No Food Insecurity  ? Worried About Charity fundraiser in the Last Year: Never true  ? Ran Out of Food in the Last Year: Never true  ?Transportation Needs: No Transportation Needs  ? Lack of Transportation (Medical): No  ? Lack of Transportation (Non-Medical): No  ?Physical Activity: Sufficiently Active  ? Days of Exercise per Week: 7 days  ? Minutes of  Exercise per Session: 30 min  ?Stress: No Stress Concern Present  ? Feeling of Stress : Not at all  ?Social Connections: Moderately Isolated  ? Frequency of Communication with Friends and Family: More than three times a week  ? Frequency of Social Gatherings with Friends and Family: More than three times a week  ? Attends Religious Services: Never  ? Active Member of Clubs or Organizations: No  ? Attends Archivist Meetings: Never  ? Marital Status: Married  ?Intimate Partner Violence: Not At Risk  ? Fear of Current or Ex-Partner: No  ? Emotionally Abused: No  ? Physically Abused: No  ? Sexually Abused: No  ? ? ?Outpatient Medications Prior to Visit  ?Medication Sig Dispense Refill  ? amLODipine (NORVASC) 10 MG tablet TAKE 1 TABLET BY MOUTH IN  THE MORNING 90 tablet 3  ? ascorbic acid (VITAMIN C) 500 MG tablet Take 500 mg by mouth daily.    ? aspirin 81 MG tablet Take 81 mg by mouth every morning.    ? gabapentin (NEURONTIN) 300 MG capsule Take 300 mg by mouth 2 (two) times daily. One capsule at lunch and two capsule at bedtime    ? hydrochlorothiazide (MICROZIDE) 12.5 MG capsule Take 1 capsule (12.5 mg total) by mouth daily. 90 capsule 2  ? losartan (COZAAR) 100 MG tablet TAKE 1 TABLET BY MOUTH IN  THE MORNING 90 tablet 3  ? metoprolol tartrate (LOPRESSOR) 25 MG tablet TAKE ONE-HALF TABLET BY  MOUTH TWICE DAILY 90 tablet 3  ? Multiple Vitamins-Minerals (ICAPS  PO) Take by mouth every morning.     ? potassium citrate (UROCIT-K) 10 MEQ (1080 MG) SR tablet Take 10 mEq by mouth in the morning and at bedtime.   3  ? rosuvastatin (CRESTOR) 40 MG tablet TAKE 1 TABLE

## 2021-05-29 ENCOUNTER — Ambulatory Visit (INDEPENDENT_AMBULATORY_CARE_PROVIDER_SITE_OTHER): Payer: Medicare Other | Admitting: Family Medicine

## 2021-05-29 ENCOUNTER — Encounter: Payer: Self-pay | Admitting: Family Medicine

## 2021-05-29 ENCOUNTER — Other Ambulatory Visit (HOSPITAL_BASED_OUTPATIENT_CLINIC_OR_DEPARTMENT_OTHER): Payer: Self-pay

## 2021-05-29 VITALS — BP 122/66 | HR 70 | Resp 20 | Ht 66.0 in | Wt 174.6 lb

## 2021-05-29 DIAGNOSIS — R739 Hyperglycemia, unspecified: Secondary | ICD-10-CM

## 2021-05-29 DIAGNOSIS — N200 Calculus of kidney: Secondary | ICD-10-CM | POA: Diagnosis not present

## 2021-05-29 DIAGNOSIS — E782 Mixed hyperlipidemia: Secondary | ICD-10-CM | POA: Diagnosis not present

## 2021-05-29 DIAGNOSIS — I10 Essential (primary) hypertension: Secondary | ICD-10-CM

## 2021-05-29 DIAGNOSIS — Z0000 Encounter for general adult medical examination without abnormal findings: Secondary | ICD-10-CM | POA: Diagnosis not present

## 2021-05-29 DIAGNOSIS — I25119 Atherosclerotic heart disease of native coronary artery with unspecified angina pectoris: Secondary | ICD-10-CM | POA: Diagnosis not present

## 2021-05-29 DIAGNOSIS — J309 Allergic rhinitis, unspecified: Secondary | ICD-10-CM | POA: Diagnosis not present

## 2021-05-29 LAB — COMPREHENSIVE METABOLIC PANEL
ALT: 19 U/L (ref 0–53)
AST: 15 U/L (ref 0–37)
Albumin: 4.2 g/dL (ref 3.5–5.2)
Alkaline Phosphatase: 69 U/L (ref 39–117)
BUN: 14 mg/dL (ref 6–23)
CO2: 29 mEq/L (ref 19–32)
Calcium: 9.3 mg/dL (ref 8.4–10.5)
Chloride: 101 mEq/L (ref 96–112)
Creatinine, Ser: 0.85 mg/dL (ref 0.40–1.50)
GFR: 84.75 mL/min (ref >60.00)
Glucose, Bld: 119 mg/dL — ABNORMAL HIGH (ref 70–99)
Potassium: 3.6 mEq/L (ref 3.5–5.1)
Sodium: 138 mEq/L (ref 135–145)
Total Bilirubin: 0.7 mg/dL (ref 0.2–1.2)
Total Protein: 6.7 g/dL (ref 6.0–8.3)

## 2021-05-29 LAB — CBC WITH DIFFERENTIAL/PLATELET
Basophils Absolute: 0.1 10*3/uL (ref 0.0–0.1)
Basophils Relative: 0.9 % (ref 0.0–3.0)
Eosinophils Absolute: 0.1 10*3/uL (ref 0.0–0.7)
Eosinophils Relative: 1.9 % (ref 0.0–5.0)
HCT: 39.8 % (ref 39.0–52.0)
Hemoglobin: 13.7 g/dL (ref 13.0–17.0)
Lymphocytes Relative: 28.5 % (ref 12.0–46.0)
Lymphs Abs: 2.2 10*3/uL (ref 0.7–4.0)
MCHC: 34.4 g/dL (ref 30.0–36.0)
MCV: 93.4 fl (ref 78.0–100.0)
Monocytes Absolute: 0.8 10*3/uL (ref 0.1–1.0)
Monocytes Relative: 10.9 % (ref 3.0–12.0)
Neutro Abs: 4.4 10*3/uL (ref 1.4–7.7)
Neutrophils Relative %: 57.8 % (ref 43.0–77.0)
Platelets: 287 10*3/uL (ref 150.0–400.0)
RBC: 4.26 Mil/uL (ref 4.22–5.81)
RDW: 12.9 % (ref 11.5–15.5)
WBC: 7.6 10*3/uL (ref 4.0–10.5)

## 2021-05-29 LAB — LIPID PANEL
Cholesterol: 91 mg/dL (ref 0–200)
HDL: 32.9 mg/dL — ABNORMAL LOW (ref >39.00)
LDL Cholesterol: 30 mg/dL (ref 0–99)
NonHDL: 57.76
Total CHOL/HDL Ratio: 3
Triglycerides: 140 mg/dL (ref 0.0–149.0)
VLDL: 28 mg/dL (ref 0.0–40.0)

## 2021-05-29 LAB — TSH: TSH: 1.45 u[IU]/mL (ref 0.35–5.50)

## 2021-05-29 LAB — HEMOGLOBIN A1C: Hgb A1c MFr Bld: 6.6 % — ABNORMAL HIGH (ref 4.6–6.5)

## 2021-05-29 MED ORDER — PROMETHAZINE-DM 6.25-15 MG/5ML PO SYRP
2.5000 mL | ORAL_SOLUTION | Freq: Three times a day (TID) | ORAL | 0 refills | Status: DC | PRN
  Filled 2021-05-29: qty 140, 10d supply, fill #0

## 2021-05-29 NOTE — Assessment & Plan Note (Addendum)
Passed kidney stones 7 in total a few weeks ago. Is following with [facility]. Is better now. Reminded to hydrate well. Sees Dr Gilford Rile ?

## 2021-05-29 NOTE — Assessment & Plan Note (Signed)
hgba1c acceptable, minimize simple carbs. Increase exercise as tolerated. Continue current meds 

## 2021-05-29 NOTE — Assessment & Plan Note (Signed)
Tolerating statin, encouraged heart healthy diet, avoid trans fats, minimize simple carbs and saturated fats. Increase exercise as tolerated 

## 2021-05-29 NOTE — Patient Instructions (Signed)
Consider adding Zyrtec/Cetirizine 10 mg daily and/or Flonase nasal spray ? ?Preventive Care 69 Years and Older, Male ?Preventive care refers to lifestyle choices and visits with your health care provider that can promote health and wellness. Preventive care visits are also called wellness exams. ?What can I expect for my preventive care visit? ?Counseling ?During your preventive care visit, your health care provider may ask about your: ?Medical history, including: ?Past medical problems. ?Family medical history. ?History of falls. ?Current health, including: ?Emotional well-being. ?Home life and relationship well-being. ?Sexual activity. ?Memory and ability to understand (cognition). ?Lifestyle, including: ?Alcohol, nicotine or tobacco, and drug use. ?Access to firearms. ?Diet, exercise, and sleep habits. ?Work and work Statistician. ?Sunscreen use. ?Safety issues such as seatbelt and bike helmet use. ?Physical exam ?Your health care provider will check your: ?Height and weight. These may be used to calculate your BMI (body mass index). BMI is a measurement that tells if you are at a healthy weight. ?Waist circumference. This measures the distance around your waistline. This measurement also tells if you are at a healthy weight and may help predict your risk of certain diseases, such as type 2 diabetes and high blood pressure. ?Heart rate and blood pressure. ?Body temperature. ?Skin for abnormal spots. ?What immunizations do I need? ?Vaccines are usually given at various ages, according to a schedule. Your health care provider will recommend vaccines for you based on your age, medical history, and lifestyle or other factors, such as travel or where you work. ?What tests do I need? ?Screening ?Your health care provider may recommend screening tests for certain conditions. This may include: ?Lipid and cholesterol levels. ?Diabetes screening. This is done by checking your blood sugar (glucose) after you have not eaten  for a while (fasting). ?Hepatitis C test. ?Hepatitis B test. ?HIV (human immunodeficiency virus) test. ?STI (sexually transmitted infection) testing, if you are at risk. ?Lung cancer screening. ?Colorectal cancer screening. ?Prostate cancer screening. ?Abdominal aortic aneurysm (AAA) screening. You may need this if you are a current or former smoker. ?Talk with your health care provider about your test results, treatment options, and if necessary, the need for more tests. ?Follow these instructions at home: ?Eating and drinking ? ?Eat a diet that includes fresh fruits and vegetables, whole grains, lean protein, and low-fat dairy products. Limit your intake of foods with high amounts of sugar, saturated fats, and salt. ?Take vitamin and mineral supplements as recommended by your health care provider. ?Do not drink alcohol if your health care provider tells you not to drink. ?If you drink alcohol: ?Limit how much you have to 0-2 drinks a day. ?Know how much alcohol is in your drink. In the [place] one drink equals one 12 oz bottle of beer (355 mL), one 5 oz glass of wine (148 mL), or one 1? oz glass of hard liquor (44 mL). ?Lifestyle ?Brush your teeth every morning and night with fluoride toothpaste. Floss one time each day. ?Exercise for at least 30 minutes 5 or more days each week. ?Do not use any products that contain nicotine or tobacco. These products include cigarettes, chewing tobacco, and vaping devices, such as e-cigarettes. If you need help quitting, ask your health care provider. ?Do not use drugs. ?If you are sexually active, practice safe sex. Use a condom or other form of protection to prevent STIs. ?Take aspirin only as told by your health care provider. Make sure that you understand how much to take and what form to take. Work with your  health care provider to find out whether it is safe and beneficial for you to take aspirin daily. ?Ask your health care provider if you need to take a  cholesterol-lowering medicine (statin). ?Find healthy ways to manage stress, such as: ?Meditation, yoga, or listening to music. ?Journaling. ?Talking to a trusted person. ?Spending time with friends and family. ?Safety ?Always wear your seat belt while driving or riding in a vehicle. ?Do not drive: ?If you have been drinking alcohol. Do not ride with someone who has been drinking. ?When you are tired or distracted. ?While texting. ?If you have been using any mind-altering substances or drugs. ?Wear a helmet and other protective equipment during sports activities. ?If you have firearms in your house, make sure you follow all gun safety procedures. ?Minimize exposure to UV radiation to reduce your risk of skin cancer. ?What's next? ?Visit your health care provider once a year for an annual wellness visit. ?Ask your health care provider how often you should have your eyes and teeth checked. ?Stay up to date on all vaccines. ?This information is not intended to replace advice given to you by your health care provider. Make sure you discuss any questions you have with your health care provider. ?Document Revised: 08/23/2020 Document Reviewed: 08/23/2020 ?Elsevier Patient Education ? [place]. ? ?

## 2021-05-29 NOTE — Assessment & Plan Note (Signed)
Add Zyrtec and Flonase daily ?

## 2021-05-29 NOTE — Assessment & Plan Note (Addendum)
Well controlled, no changes to meds. Encouraged heart healthy diet such as the DASH diet and exercise as tolerated. Systolic at home [phone] ?

## 2021-06-01 ENCOUNTER — Other Ambulatory Visit (HOSPITAL_BASED_OUTPATIENT_CLINIC_OR_DEPARTMENT_OTHER): Payer: Self-pay

## 2021-06-26 ENCOUNTER — Telehealth: Payer: Self-pay | Admitting: Family Medicine

## 2021-06-26 NOTE — Telephone Encounter (Signed)
Pt is waiting for an update regarding $85.00 charge for tetanus shot. Please advise.  ?

## 2021-06-26 NOTE — Telephone Encounter (Signed)
Reached out to [facility] regarding the tetanus charge and she sent an email to charge correction to have it looked at again and requested it to be removed.  I called patient back and advised we have emailed charge correction to have charge voided and to please allow at least another billing cycle to see if charge is then removed from his account and if for some reason it is not, I advised him to please reach back out to me in follow up. ?

## 2021-07-13 DIAGNOSIS — M5416 Radiculopathy, lumbar region: Secondary | ICD-10-CM | POA: Diagnosis not present

## 2021-07-13 DIAGNOSIS — M25551 Pain in right hip: Secondary | ICD-10-CM | POA: Diagnosis not present

## 2021-08-15 DIAGNOSIS — N200 Calculus of kidney: Secondary | ICD-10-CM | POA: Diagnosis not present

## 2021-09-19 NOTE — Progress Notes (Unsigned)
Subjective:   Anthony [name] is a 76 y.o. male who presents for Medicare Annual/Subsequent preventive examination.  Review of Systems     Cardiac Risk Factors include: advanced age (>31mn, >>61women);male gender;hypertension;dyslipidemia     Objective:    There were no vitals filed for this visit. There is no height or weight on file to calculate BMI.     09/20/2021    8:24 AM 09/14/2020    9:05 AM 01/12/2020   11:34 AM 11/13/2019   10:28 AM 04/23/2019    9:07 AM 04/20/2018    1:00 PM 04/18/2017    8:06 AM  Advanced Directives  Does Patient Have a Medical Advance Directive? Yes Yes Yes Yes Yes Yes Yes  Type of AParamed[place]of [place] will [place] will Living will;Healthcare Power of [place] will Living will;Healthcare Power of [place] will  Does patient want to make changes to medical advance directive? No - Patient declined   No - Patient declined No - Patient declined No - Patient declined No - Patient declined  Copy of HStacey Streetin Chart? Yes - validated most recent copy scanned in chart (See row information) Yes - validated most recent copy scanned in chart (See row information) No - copy requested No - copy requested Yes - validated most recent copy scanned in chart (See row information) Yes - validated most recent copy scanned in chart (See row information) Yes  Would patient like information on creating a medical advance directive?    No - Patient declined       Current Medications (verified) Outpatient Encounter Medications as of 09/20/2021  Medication Sig   amLODipine (NORVASC) 10 MG tablet TAKE 1 TABLET BY MOUTH IN  THE MORNING   ascorbic acid (VITAMIN C) 500 MG tablet Take 500 mg by mouth daily.   aspirin 81 MG tablet Take 81 mg by mouth every morning.   gabapentin (NEURONTIN) 300 MG  capsule Take 300 mg by mouth 2 (two) times daily. One capsule at lunch and two capsule at bedtime   losartan (COZAAR) 100 MG tablet TAKE 1 TABLET BY MOUTH IN  THE MORNING   metoprolol tartrate (LOPRESSOR) 25 MG tablet TAKE ONE-HALF TABLET BY  MOUTH TWICE DAILY   Multiple Vitamins-Minerals (ICAPS PO) Take by mouth every morning.    potassium citrate (UROCIT-K) 10 MEQ (1080 MG) SR tablet Take 10 mEq by mouth in the morning and at bedtime.    rosuvastatin (CRESTOR) 40 MG tablet TAKE 1 TABLET BY MOUTH  DAILY   hydrochlorothiazide (MICROZIDE) 12.5 MG capsule Take 1 capsule (12.5 mg total) by mouth daily.   [DISCONTINUED] promethazine-dextromethorphan (PROMETHAZINE-DM) 6.25-15 MG/5ML syrup Take 2.5-5 mLs by mouth 3 (three) times daily as needed for cough.   No facility-administered encounter medications on file as of 09/20/2021.    Allergies (verified) Patient has no known allergies.   History: Past Medical History:  Diagnosis Date   Allergic rhinitis    BPH (benign prostatic hyperplasia)    CAD (coronary artery disease) 05/26/2009 :  primary cardiolgoist-  dr cStanford Breed  hx NSTEMI cardiac catherization  revealed-left main normal; LAD proximal calcification,  long proximal 30% stenosis,  Circumflex  AV groove   proximal  60% stenosis at mid obtuse marginal,  first large mid obtuse marginal  ostial 70% stenosis,  prox. to mid RCA  99% stenosis w/ intervention PCI and  DES   Carotid stenosis, asymptomatic, bilateral    per last duplex 03-31-2015  bilateral ICA 1-39%   Cataract    removed   External hemorrhoids    large external   GERD (gastroesophageal reflux disease)    Glaucoma    History of adenomatous polyp of colon    History of kidney stones    History of non-ST elevation myocardial infarction (NSTEMI) 05/26/2009   Hyperlipidemia    Hypertension    8-10 years   Left ureteral stone    Macular degeneration    Myocardial infarction [facility])    Nephrolithiasis    right side  non-obstructive per CT 01-03-2017   S/P drug eluting coronary stent placement 05/26/2009   DESx1  to pRCA   Wears glasses    Past Surgical History:  Procedure Laterality Date   CARDIOVASCULAR STRESS TEST  12/26/2011   dr Stanford Breed   normal nuclear study w/ no ischemia/  normal LV function and wall motion , ef 71%   CATARACT EXTRACTION W/ INTRAOCULAR LENS  IMPLANT, BILATERAL  2010   COLONOSCOPY  last one 06-10-2016   CORONARY ANGIOPLASTY WITH STENT PLACEMENT  05-26-2009  dr hochrein/ dr Darnell Level brodie   Severe RCA stenosis and moderate stenosis in the left system, ef 50% with inferior hypokinsis:  PCI and DES x1 to RCA (Prom[facility])   CYSTOSCOPY W/ RETROGRADES Bilateral 09/29/2013   Procedure: CYSTOSCOPY WITH RETROGRADE PYELOGRAM;  Surgeon: Alexis Frock, MD;  Location: [facility];  Service: Urology;  Laterality: Bilateral;   CYSTOSCOPY WITH RETROGRADE PYELOGRAM, URETEROSCOPY AND STENT PLACEMENT N/A 06/14/2015   Procedure: CYSTOSCOPY WITH RETROGRADE PYELOGRAM, cysto litholapexy ;  Surgeon: Alexis Frock, MD;  Location: [facility];  Service: Urology;  Laterality: N/A;   CYSTOSCOPY/URETEROSCOPY/HOLMIUM LASER/STENT PLACEMENT Left 01/07/2017   Procedure: CYSTOSCOPY/RETROGRADE/URETEROSCOPY/HOLMIUM LASER/STENT PLACEMENT, FULGERATION OF PROSTATIC URETHRA;  Surgeon: Ceasar Mons, MD;  Location[facility];  Service: Urology;  Laterality: Left;   CYSTOSCOPY/URETEROSCOPY/HOLMIUM LASER/STENT PLACEMENT Right 01/12/2020   Procedure: CYSTOSCOPY/ RIGHT RETROGRADE/ RIGHT URETEROSCOPY/ RIGHT HOLMIUM LASER/ RIGHT STENT PLACEMENT;  Surgeon: Ceasar Mons, MD;  Location: [facility];  Service: Urology;  Laterality: Right;  ONLY NEEDS 60 MIN   EYE SURGERY     HOLMIUM LASER APPLICATION N/A 62/83/6629   Procedure: HOLMIUM LASER APPLICATION bladder stone;  Surgeon: Alexis Frock, MD;  Location[facility];  Service: Urology;  Laterality:  N/A;   HOLMIUM LASER APPLICATION Left 47/65/4650   Procedure: HOLMIUM LASER APPLICATION;  Surgeon: Ceasar Mons, MD;  Location: [facility];  Service: Urology;  Laterality: Left;   NEPHROLITHOTOMY Left 09/29/2013   Procedure: NEPHROLITHOTOMY PERCUTANEOUS WITH ACCESS;  Surgeon: Alexis Frock, MD;  Location: [facility];  Service: Urology;  Laterality: Left;   URETEROSCOPY N/A 09/29/2013   Procedure: URETEROSCOPY;  Surgeon: Alexis Frock, MD;  Location: [facility];  Service: Urology;  Laterality: N/A;   Family History  Problem Relation Age of Onset   Coronary artery disease Father        in his 67's   Hyperlipidemia Father    Hypertension Father    Hearing loss Father    Heart disease Mother        deceased of heart disease   Hypertension Mother    Hyperlipidemia Mother    Multiple sclerosis Son    Heart disease Son    Heart disease Maternal Grandmother    Heart disease Maternal Grandfather    Heart disease Paternal Grandmother  Heart disease Paternal Grandfather    GER disease Sister    Heart disease Paternal Aunt    Other Neg Hx        no lung cancer, colon cancer, or prostate   Colon cancer Neg Hx    Stomach cancer Neg Hx    Esophageal cancer Neg Hx    Social History   Socioeconomic History   Marital status: Married    Spouse name: Not on file   Number of children: Not on file   Years of education: Not on file   Highest education level: Not on file  Occupational History   Not on file  Tobacco Use   Smoking status: Former    Years: 45.00    Types: Cigarettes    Quit date: 03/12/2007    Years since quitting: 14.5   Smokeless tobacco: Never  Substance and Sexual Activity   Alcohol use: No   Drug use: No   Sexual activity: Yes    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  He had     a 40 pack-year smoking history but has discontinued.     prev occuptation -  [employer]   Alcohol use-no   Smoking Status:  quit   Packs/Day:  0.5   Caffeine use/day:  2 beverages daily   Does Patient Exercise:  yes            Social Determinants of Health   Financial Resource Strain: Low Risk  (09/14/2020)   Overall Financial Resource Strain (CARDIA)    Difficulty of Paying Living Expenses: Not hard at all  Food Insecurity: No Food Insecurity (09/14/2020)   Hunger Vital Sign    Worried About Running Out of Food in the Last Year: Never true    Ran Out of Food in the Last Year: Never true  Transportation Needs: No Transportation Needs (09/14/2020)   PRAPARE - Hydrologist (Medical): No    Lack of Transportation (Non-Medical): No  Physical Activity: Sufficiently Active (09/14/2020)   Exercise Vital Sign    Days of Exercise per Week: 7 days    Minutes of Exercise per Session: 30 min  Stress: No Stress Concern Present (09/14/2020)   [place]    Feeling of Stress : Not at all  Social Connections: Moderately Isolated (09/14/2020)   Social Connection and Isolation Panel [NHANES]    Frequency of Communication with Friends and Family: More than three times a week    Frequency of Social Gatherings with Friends and Family: More than three times a week    Attends Religious Services: Never    Marine scientist or Organizations: No    Attends Music therapist: Never    Marital Status: Married    Tobacco Counseling Counseling given: Not Answered   Clinical Intake:  Pre-visit preparation completed: Yes  Pain : No/denies pain     BMI - recorded: 28.18 Nutritional Status: BMI 25 -29 Overweight Nutritional Risks: None Diabetes: No  How often do you need to have someone help you when you read instructions, pamphlets, or  other written materials from your doctor or pharmacy?: 1 - Never  Diabetic?no  Interpreter Needed?: No  Information entered by :: [place] of Daily Living    09/20/2021    8:26 AM  In your present state of health, do you have any difficulty performing the following activities:  Hearing? 0  Vision? 0  Difficulty concentrating or making decisions? 0  Walking or climbing stairs? 0  Dressing or bathing? 0  Doing errands, shopping? 0  Preparing Food and eating ? N  Using the Toilet? N  In the past six months, have you accidently leaked urine? N  Do you have problems with loss of bowel control? N  Managing your Medications? N  Managing your Finances? N  Housekeeping or managing your Housekeeping? N    Patient Care Team: [name], MD as PCP - General (Family Medicine) [place], Jennefer Bravo, MD as Referring Physician (Dermatology) Alexis Frock, MD as Consulting Physician (Urology) Gerarda Fraction, MD as Referring Physician (Ophthalmology) Lelon Perla, MD as Consulting Physician (Cardiology) Ceasar Mons, MD as Consulting Physician (Urology) Pyrtle, Lajuan Lines, MD as Consulting Physician (Gastroenterology)  Indicate any recent Medical Services you may have received from other than [facility] providers in the past year (date may be approximate).     Assessment:   This is a routine wellness examination for Anthony [name].  Hearing/Vision screen No results found.  Dietary issues and exercise activities discussed: Current Exercise Habits: Home exercise routine, Type of exercise: walking, Time (Minutes): 45, Frequency (Times/Week): 7, Weekly Exercise (Minutes/Week): 315, Intensity: Mild, Exercise limited by: None identified   Goals Addressed   None    Depression Screen    09/20/2021    8:24 AM 05/29/2021    8:54 AM 09/14/2020    9:07 AM 05/04/2020    8:32 AM 04/23/2019    9:09 AM 10/22/2018    8:25 AM 04/20/2018    1:01 PM  PHQ 2/9 Scores  PHQ -  2 Score 0 0 0 0 0 0 0  PHQ- 9 Score    0       Fall Risk    09/20/2021    8:24 AM 05/29/2021    8:53 AM 09/14/2020    9:06 AM 05/04/2020    8:33 AM 04/23/2019    9:09 AM  Fall Risk   Falls in the past year? 0 0 0 1 0  Number falls in past yr: 0 0 0 0 0  Injury with Fall? 0 0 0 1 0  Risk for fall due to : No Fall Risks No Fall Risks     Follow up Falls evaluation completed Falls evaluation completed Falls prevention discussed  Education provided;Falls prevention discussed    FALL RISK PREVENTION PERTAINING TO THE HOME:  Any stairs in or around the home? No  If so, are there any without handrails?  N/A Home free of loose throw rugs in walkways, pet beds, electrical cords, etc? Yes  Adequate lighting in your home to reduce risk of falls? Yes   ASSISTIVE DEVICES UTILIZED TO PREVENT FALLS:  Life alert? No  Use of a cane, walker or w/c? No  Grab bars in the bathroom? Yes  Shower chair or bench in shower? No  Elevated toilet seat or a handicapped toilet? No   TIMED UP AND GO:  Was the test performed? No .    Cognitive Function:    04/20/2018    1:13 PM 04/18/2017    8:07 AM  MMSE - Mini Mental State Exam  Orientation to time 5  5  Orientation to Place 5 5  Registration 3 3  Attention/ Calculation 5 5  Recall 3 3  Language- name 2 objects 2 2  Language- repeat 1 1  Language- follow 3 step command 3 3  Language- read & follow direction 1 1  Write a sentence 1 1  Copy design 1 1  Total score 30 30        09/20/2021    8:28 AM  6CIT Screen  What Year? 0 points  What month? 0 points  What time? 0 points  Count back from 20 0 points  Months in reverse 0 points  Repeat phrase 0 points  Total Score 0 points    Immunizations Immunization History  Administered Date(s) Administered   Fluad Quad(high Dose 65+) 11/06/2018, 12/02/2019, 12/01/2020   Influenza Split 12/07/2010, 11/20/2011, 12/05/2011   Influenza Whole 01/06/2009, 11/20/2009   Influenza, High Dose Seasonal  PF 12/06/2015, 12/17/2016, 12/01/2017   Influenza,inj,Quad PF,6+ Mos 12/09/2012, 12/15/2013, 11/30/2014   PFIZER(Purple Top)SARS-COV-2 Vaccination 04/18/2019, 05/12/2019, 02/11/2020   Pneumococcal Conjugate-13 04/14/2015   Pneumococcal Polysaccharide-23 05/11/2009, 12/17/2016   Td 10/27/2007   Tdap 11/02/2020   Zoster Recombinat (Shingrix) 12/24/2017, 03/09/2018   Zoster, Live 05/30/2008    TDAP status: Up to date  Flu Vaccine status: Up to date  Pneumococcal vaccine status: Up to date  Covid-19 vaccine status: Information provided on how to obtain vaccines.   Qualifies for Shingles Vaccine? Yes   Zostavax completed No   Shingrix Completed?: Yes  Screening Tests Health Maintenance  Topic Date Due   COVID-19 Vaccine (4 - Pfizer series) 04/07/2020   INFLUENZA VACCINE  10/09/2021   COLONOSCOPY (Pts 45-84yr Insurance coverage will need to be confirmed)  08/04/2024   TETANUS/TDAP  11/03/2030   Pneumonia Vaccine 76 Years old  Completed   Hepatitis C Screening  Completed   Zoster Vaccines- Shingrix  Completed   HPV VACCINES  Aged Out    Health Maintenance  Health Maintenance Due  Topic Date Due   COVID-19 Vaccine (4 - Pfizer series) 04/07/2020    Colorectal cancer screening: Type of screening: Colonoscopy. Completed 08/05/19. Repeat every 5 years  Lung Cancer Screening: (Low Dose CT Chest recommended if Age 76-80years, 30 pack-year currently smoking OR have quit w/in 15years.) does not qualify.   Lung Cancer Screening Referral: n/a  Additional Screening:  Hepatitis C Screening: does qualify; Completed 04/14/15  Vision Screening: Recommended annual ophthalmology exams for early detection of glaucoma and other disorders of the eye. Is the patient up to date with their annual eye exam?  No  Who is the provider or what is the name of the office in which the patient attends annual eye exams? [facility] is not established with a provider, would they like to be  referred to a provider to establish care? No .   Dental Screening: Recommended annual dental exams for proper oral hygiene  Community Resource Referral / Chronic Care Management: CRR required this visit?  No   CCM required this visit?  No      Plan:     I have personally reviewed and noted the following in the patient's chart:   Medical and social history Use of alcohol, tobacco or illicit drugs  Current medications and supplements including opioid prescriptions. Patient is not currently taking opioid prescriptions. Functional ability and status Nutritional status Physical activity Advanced directives List of other physicians Hospitalizations, surgeries, and ER visits in previous 12 months Vitals Screenings to include  cognitive, depression, and falls Referrals and appointments  In addition, I have reviewed and discussed with patient certain preventive protocols, quality metrics, and best practice recommendations. A written personalized care plan for preventive services as well as general preventive health recommendations were provided to patient.   Due to this being a telephonic visit, the after visit summary with patients personalized plan was offered to patient via mail or my-chart. Patient would like to access on my-chart.    Duard Brady Reannon Candella, [place]   09/20/2021   Nurse Notes: none

## 2021-09-20 ENCOUNTER — Ambulatory Visit (INDEPENDENT_AMBULATORY_CARE_PROVIDER_SITE_OTHER): Payer: Medicare Other

## 2021-09-20 DIAGNOSIS — Z0000 Encounter for general adult medical examination without abnormal findings: Secondary | ICD-10-CM

## 2021-09-20 NOTE — Patient Instructions (Signed)
Anthony [name] , Thank you for taking time to come for your Medicare Wellness Visit. I appreciate your ongoing commitment to your health goals. Please review the following plan we discussed and let me know if I can assist you in the future.   Screening recommendations/referrals: Colonoscopy: 08/05/19 due 08/04/24 Recommended yearly ophthalmology/optometry visit for glaucoma screening and checkup Recommended yearly dental visit for hygiene and checkup  Vaccinations: Influenza vaccine: up to date Pneumococcal vaccine: up to date Tdap vaccine: up to date Shingles vaccine: up to date   Covid-19: Due-May obtain vaccine at your local pharmacy.   Advanced directives: yes, on file  Conditions/risks identified: see problem list   Next appointment: Follow up in one year for your annual wellness visit.   Preventive Care 22 Years and Older, Male Preventive care refers to lifestyle choices and visits with your health care provider that can promote health and wellness. What does preventive care include? A yearly physical exam. This is also called an annual well check. Dental exams once or twice a year. Routine eye exams. Ask your health care provider how often you should have your eyes checked. Personal lifestyle choices, including: Daily care of your teeth and gums. Regular physical activity. Eating a healthy diet. Avoiding tobacco and drug use. Limiting alcohol use. Practicing safe sex. Taking low doses of aspirin every day. Taking vitamin and mineral supplements as recommended by your health care provider. What happens during an annual well check? The services and screenings done by your health care provider during your annual well check will depend on your age, overall health, lifestyle risk factors, and family history of disease. Counseling  Your health care provider may ask you questions about your: Alcohol use. Tobacco use. Drug use. Emotional well-being. Home and relationship  well-being. Sexual activity. Eating habits. History of falls. Memory and ability to understand (cognition). Work and work Statistician. Screening  You may have the following tests or measurements: Height, weight, and BMI. Blood pressure. Lipid and cholesterol levels. These may be checked every 5 years, or more frequently if you are over 45 years old. Skin check. Lung cancer screening. You may have this screening every year starting at age 76 if you have a 30-pack-year history of smoking and currently smoke or have quit within the past 15 years. Fecal occult blood test (FOBT) of the stool. You may have this test every year starting at age 34. Flexible sigmoidoscopy or colonoscopy. You may have a sigmoidoscopy every 5 years or a colonoscopy every 10 years starting at age 42. Prostate cancer screening. Recommendations will vary depending on your family history and other risks. Hepatitis C blood test. Hepatitis B blood test. Sexually transmitted disease (STD) testing. Diabetes screening. This is done by checking your blood sugar (glucose) after you have not eaten for a while (fasting). You may have this done every 1-3 years. Abdominal aortic aneurysm (AAA) screening. You may need this if you are a current or former smoker. Osteoporosis. You may be screened starting at age 27 if you are at high risk. Talk with your health care provider about your test results, treatment options, and if necessary, the need for more tests. Vaccines  Your health care provider may recommend certain vaccines, such as: Influenza vaccine. This is recommended every year. Tetanus, diphtheria, and acellular pertussis (Tdap, Td) vaccine. You may need a Td booster every 10 years. Zoster vaccine. You may need this after age 81. Pneumococcal 13-valent conjugate (PCV13) vaccine. One dose is recommended after age 63. Pneumococcal  polysaccharide (PPSV23) vaccine. One dose is recommended after age 93. Talk to your health care  provider about which screenings and vaccines you need and how often you need them. This information is not intended to replace advice given to you by your health care provider. Make sure you discuss any questions you have with your health care provider. Document Released: 03/24/2015 Document Revised: 11/15/2015 Document Reviewed: 12/27/2014 Elsevier Interactive Patient Education  2017 [place] Prevention in the Home Falls can cause injuries. They can happen to people of all ages. There are many things you can do to make your home safe and to help prevent falls. What can I do on the outside of my home? Regularly fix the edges of walkways and driveways and fix any cracks. Remove anything that might make you trip as you walk through a door, such as a raised step or threshold. Trim any bushes or trees on the path to your home. Use bright outdoor lighting. Clear any walking paths of anything that might make someone trip, such as rocks or tools. Regularly check to see if handrails are loose or broken. Make sure that both sides of any steps have handrails. Any raised decks and porches should have guardrails on the edges. Have any leaves, snow, or ice cleared regularly. Use sand or salt on walking paths during winter. Clean up any spills in your garage right away. This includes oil or grease spills. What can I do in the bathroom? Use night lights. Install grab bars by the toilet and in the tub and shower. Do not use towel bars as grab bars. Use non-skid mats or decals in the tub or shower. If you need to sit down in the shower, use a plastic, non-slip stool. Keep the floor dry. Clean up any water that spills on the floor as soon as it happens. Remove soap buildup in the tub or shower regularly. Attach bath mats securely with double-sided non-slip rug tape. Do not have throw rugs and other things on the floor that can make you trip. What can I do in the bedroom? Use night lights. Make  sure that you have a light by your bed that is easy to reach. Do not use any sheets or blankets that are too big for your bed. They should not hang down onto the floor. Have a firm chair that has side arms. You can use this for support while you get dressed. Do not have throw rugs and other things on the floor that can make you trip. What can I do in the kitchen? Clean up any spills right away. Avoid walking on wet floors. Keep items that you use a lot in easy-to-reach places. If you need to reach something above you, use a strong step stool that has a grab bar. Keep electrical cords out of the way. Do not use floor polish or wax that makes floors slippery. If you must use wax, use non-skid floor wax. Do not have throw rugs and other things on the floor that can make you trip. What can I do with my stairs? Do not leave any items on the stairs. Make sure that there are handrails on both sides of the stairs and use them. Fix handrails that are broken or loose. Make sure that handrails are as long as the stairways. Check any carpeting to make sure that it is firmly attached to the stairs. Fix any carpet that is loose or worn. Avoid having throw rugs at the top  or bottom of the stairs. If you do have throw rugs, attach them to the floor with carpet tape. Make sure that you have a light switch at the top of the stairs and the bottom of the stairs. If you do not have them, ask someone to add them for you. What else can I do to help prevent falls? Wear shoes that: Do not have high heels. Have rubber bottoms. Are comfortable and fit you well. Are closed at the toe. Do not wear sandals. If you use a stepladder: Make sure that it is fully opened. Do not climb a closed stepladder. Make sure that both sides of the stepladder are locked into place. Ask someone to hold it for you, if possible. Clearly mark and make sure that you can see: Any grab bars or handrails. First and last steps. Where the  edge of each step is. Use tools that help you move around (mobility aids) if they are needed. These include: Canes. Walkers. Scooters. Crutches. Turn on the lights when you go into a dark area. Replace any light bulbs as soon as they burn out. Set up your furniture so you have a clear path. Avoid moving your furniture around. If any of your floors are uneven, fix them. If there are any pets around you, be aware of where they are. Review your medicines with your doctor. Some medicines can make you feel dizzy. This can increase your chance of falling. Ask your doctor what other things that you can do to help prevent falls. This information is not intended to replace advice given to you by your health care provider. Make sure you discuss any questions you have with your health care provider. Document Released: 12/22/2008 Document Revised: 08/03/2015 Document Reviewed: 04/01/2014 Elsevier Interactive Patient Education  2017 [employer].

## 2021-11-29 DIAGNOSIS — M79661 Pain in right lower leg: Secondary | ICD-10-CM | POA: Diagnosis not present

## 2021-12-03 NOTE — Assessment & Plan Note (Signed)
Avoid offending foods, start probiotics. Do not eat large meals in late evening and consider raising head of bed.  

## 2021-12-03 NOTE — Assessment & Plan Note (Signed)
hgba1c acceptable, minimize simple carbs. Increase exercise as tolerated.  

## 2021-12-03 NOTE — Assessment & Plan Note (Signed)
Well controlled, no changes to meds. Encouraged heart healthy diet such as the DASH diet and exercise as tolerated.  °

## 2021-12-03 NOTE — Assessment & Plan Note (Signed)
Tolerating statin, encouraged heart healthy diet, avoid trans fats, minimize simple carbs and saturated fats. Increase exercise as tolerated 

## 2021-12-04 ENCOUNTER — Telehealth (INDEPENDENT_AMBULATORY_CARE_PROVIDER_SITE_OTHER): Payer: Medicare Other | Admitting: Family Medicine

## 2021-12-04 DIAGNOSIS — R739 Hyperglycemia, unspecified: Secondary | ICD-10-CM | POA: Diagnosis not present

## 2021-12-04 DIAGNOSIS — K219 Gastro-esophageal reflux disease without esophagitis: Secondary | ICD-10-CM

## 2021-12-04 DIAGNOSIS — N200 Calculus of kidney: Secondary | ICD-10-CM | POA: Diagnosis not present

## 2021-12-04 DIAGNOSIS — E782 Mixed hyperlipidemia: Secondary | ICD-10-CM

## 2021-12-04 DIAGNOSIS — I10 Essential (primary) hypertension: Secondary | ICD-10-CM

## 2021-12-04 DIAGNOSIS — I25119 Atherosclerotic heart disease of native coronary artery with unspecified angina pectoris: Secondary | ICD-10-CM | POA: Diagnosis not present

## 2021-12-04 NOTE — Progress Notes (Signed)
MyChart Video Visit Virtual Visit via Video Note   This visit type was conducted due to national recommendations for restrictions regarding the COVID-19 Pandemic (e.g. social distancing) in an effort to limit this patient's exposure and mitigate transmission in our community. This patient is at least at moderate risk for complications without adequate follow up. This format is felt to be most appropriate for this patient at this time. Physical exam was limited by quality of the video and audio technology used for the visit. [name], CMA was able to get the patient set up on a video visit.  Patient location: Patient at home and provider in visit Provider location: Office  I discussed the limitations of evaluation and management by telemedicine and the availability of in person appointments. The patient expressed understanding and agreed to proceed.  Visit Date: 12/04/2021  Today's healthcare provider: Penni Homans, MD     Subjective:    Patient ID: Anthony [name], male    DOB: 1945/04/10, 76 y.o.   MRN: [id number]  Chief Complaint  Patient presents with   Follow-up    Here for follow up    HPI Patient is in today for a video visit. He denies breathing difficulty, chest pain, chills and fever today.  Immunizations: He has been informed about receiving COVID-19, high-dose Flu, and RSV immunizations. He is interested in receiving the high-dose Flu immunization on his next nurse visit.  Kidney stones: He reports that in 05/2021, his urologist discovered 4 kidney stones and then 11 more 2 weeks after the initial discovery. He further reports that he had been taking ascorbic acid, which could have contributed to the kidney stones.  Nephrology: He has an appointment scheduled with nephrology on 01/24/2022.   Past Medical History:  Diagnosis Date   Allergic rhinitis    BPH (benign prostatic hyperplasia)    CAD (coronary artery disease) 05/26/2009 :  primary cardiolgoist-  dr  Stanford Breed   hx NSTEMI cardiac catherization  revealed-left main normal; LAD proximal calcification,  long proximal 30% stenosis,  Circumflex  AV groove   proximal  60% stenosis at mid obtuse marginal,  first large mid obtuse marginal  ostial 70% stenosis,  prox. to mid RCA  99% stenosis w/ intervention PCI and DES   Carotid stenosis, asymptomatic, bilateral    per last duplex 03-31-2015  bilateral ICA 1-39%   Cataract    removed   External hemorrhoids    large external   GERD (gastroesophageal reflux disease)    Glaucoma    History of adenomatous polyp of colon    History of kidney stones    History of non-ST elevation myocardial infarction (NSTEMI) 05/26/2009   Hyperlipidemia    Hypertension    8-10 years   Left ureteral stone    Macular degeneration    Myocardial infarction [facility])    Nephrolithiasis    right side non-obstructive per CT 01-03-2017   S/P drug eluting coronary stent placement 05/26/2009   DESx1  to pRCA   Wears glasses    Past Surgical History:  Procedure Laterality Date   CARDIOVASCULAR STRESS TEST  12/26/2011   dr Stanford Breed   normal nuclear study w/ no ischemia/  normal LV function and wall motion , ef 71%   CATARACT EXTRACTION W/ INTRAOCULAR LENS  IMPLANT, BILATERAL  2010   COLONOSCOPY  last one 06-10-2016   CORONARY ANGIOPLASTY WITH STENT PLACEMENT  05-26-2009  dr hochrein/ dr Darnell Level brodie   Severe RCA stenosis and moderate stenosis in the  left system, ef 50% with inferior hypokinsis:  PCI and DES x1 to RCA ([facility])   CYSTOSCOPY W/ RETROGRADES Bilateral 09/29/2013   Procedure: CYSTOSCOPY WITH RETROGRADE PYELOGRAM;  Surgeon: Alexis Frock, MD;  Location: [facility];  Service: Urology;  Laterality: Bilateral;   CYSTOSCOPY WITH RETROGRADE PYELOGRAM, URETEROSCOPY AND STENT PLACEMENT N/A 06/14/2015   Procedure: CYSTOSCOPY WITH RETROGRADE PYELOGRAM, cysto litholapexy ;  Surgeon: Alexis Frock, MD;  [facility];  Service: Urology;  Laterality: N/A;    CYSTOSCOPY/URETEROSCOPY/HOLMIUM LASER/STENT PLACEMENT Left 01/07/2017   Procedure: CYSTOSCOPY/RETROGRADE/URETEROSCOPY/HOLMIUM LASER/STENT PLACEMENT, FULGERATION OF PROSTATIC URETHRA;  Surgeon: Ceasar Mons, MD;  Location: [facility];  Service: Urology;  Laterality: Left;   CYSTOSCOPY/URETEROSCOPY/HOLMIUM LASER/STENT PLACEMENT Right 01/12/2020   Procedure: CYSTOSCOPY/ RIGHT RETROGRADE/ RIGHT URETEROSCOPY/ RIGHT HOLMIUM LASER/ RIGHT STENT PLACEMENT;  Surgeon: Ceasar Mons, MD;  Location: [facility];  Service: Urology;  Laterality: Right;  ONLY NEEDS 60 MIN   EYE SURGERY     HOLMIUM LASER APPLICATION N/A 69/67/8938   Procedure: HOLMIUM LASER APPLICATION bladder stone;  Surgeon: Alexis Frock, MD;  Location: [facility];  Service: Urology;  Laterality: N/A;   HOLMIUM LASER APPLICATION Left 12/25/5100   Procedure: HOLMIUM LASER APPLICATION;  Surgeon: Ceasar Mons, MD;  Location: [facility];  Service: Urology;  Laterality: Left;   NEPHROLITHOTOMY Left 09/29/2013   Procedure: NEPHROLITHOTOMY PERCUTANEOUS WITH ACCESS;  Surgeon: Alexis Frock, MD;  Location: [facility];  Service: Urology;  Laterality: Left;   URETEROSCOPY N/A 09/29/2013   Procedure: URETEROSCOPY;  Surgeon: Alexis Frock, MD;  Location: [facility];  Service: Urology;  Laterality: N/A;   Family History  Problem Relation Age of Onset   Coronary artery disease Father        in his 56's   Hyperlipidemia Father    Hypertension Father    Hearing loss Father    Heart disease Mother        deceased of heart disease   Hypertension Mother    Hyperlipidemia Mother    Multiple sclerosis Son    Heart disease Son    Heart disease Maternal Grandmother    Heart disease Maternal Grandfather    Heart disease Paternal Grandmother    Heart disease Paternal Grandfather    GER disease Sister    Heart disease Paternal Aunt    Other Neg Hx        no  lung cancer, colon cancer, or prostate   Colon cancer Neg Hx    Stomach cancer Neg Hx    Esophageal cancer Neg Hx    Social History   Socioeconomic History   Marital status: Married    Spouse name: Not on file   Number of children: Not on file   Years of education: Not on file   Highest education level: Not on file  Occupational History   Not on file  Tobacco Use   Smoking status: Former    Years: 45.00    Types: Cigarettes    Quit date: 03/12/2007    Years since quitting: 14.7   Smokeless tobacco: Never  Substance and Sexual Activity   Alcohol use: No   Drug use: No   Sexual activity: Yes    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  He had     a 40 pack-year smoking history but has discontinued.     prev occuptation -  [employer]   Alcohol use-no   Smoking Status:  quit   Packs/Day:  0.5   Caffeine use/day:  2 beverages daily   Does Patient Exercise:  yes            Social Determinants of Health   Financial Resource Strain: Low Risk  (09/14/2020)   Overall Financial Resource Strain (CARDIA)    Difficulty of Paying Living Expenses: Not hard at all  Food Insecurity: No Food Insecurity (09/14/2020)   Hunger Vital Sign    Worried About Running Out of Food in the Last Year: Never true    Ran Out of Food in the Last Year: Never true  Transportation Needs: No Transportation Needs (09/14/2020)   PRAPARE - Administrator, Civil Service (Medical): No    Lack of Transportation (Non-Medical): No  Physical Activity: Sufficiently Active (09/14/2020)   Exercise Vital Sign    Days of Exercise per Week: 7 days    Minutes of Exercise per Session: 30 min  Stress: No Stress Concern Present (09/14/2020)   [employer] of Occupational Health - Occupational  Stress Questionnaire    Feeling of Stress : Not at all  Social Connections: Moderately Isolated (09/14/2020)   Social Connection and Isolation Panel [NHANES]    Frequency of Communication with Friends and Family: More than three times a week    Frequency of Social Gatherings with Friends and Family: More than three times a week    Attends Religious Services: Never    Database administrator or Organizations: No    Attends Banker Meetings: Never    Marital Status: Married  Catering manager Violence: Not At Risk (09/14/2020)   Humiliation, Afraid, Rape, and Kick questionnaire    Fear of Current or Ex-Partner: No    Emotionally Abused: No    Physically Abused: No    Sexually Abused: No   Outpatient Medications Prior to Visit  Medication Sig Dispense Refill   amLODipine (NORVASC) 10 MG tablet TAKE 1 TABLET BY MOUTH IN  THE MORNING 90 tablet 3   ascorbic acid (VITAMIN C) 500 MG tablet Take 500 mg by mouth daily.     aspirin 81 MG tablet Take 81 mg by mouth every morning.     gabapentin (NEURONTIN) 300 MG capsule Take 300 mg by mouth 2 (two) times daily. One capsule at lunch and two capsule at bedtime     hydrochlorothiazide (MICROZIDE) 12.5 MG capsule Take 1 capsule (12.5 mg total) by mouth daily. 90 capsule 2   losartan (COZAAR) 100 MG tablet TAKE 1 TABLET BY MOUTH IN  THE MORNING 90 tablet 3   metoprolol tartrate (LOPRESSOR) 25 MG tablet TAKE ONE-HALF TABLET BY  MOUTH TWICE DAILY 90 tablet 3   Multiple Vitamins-Minerals (ICAPS PO) Take by mouth every morning.      potassium citrate (UROCIT-K) 10 MEQ (1080 MG) SR tablet Take 10 mEq by mouth in the morning and at bedtime.   3   rosuvastatin (CRESTOR) 40 MG tablet TAKE 1 TABLET BY MOUTH  DAILY 90 tablet 3   No facility-administered medications prior to visit.   No Known Allergies  Review of Systems  Constitutional:  Negative for chills and fever.  Respiratory: Negative.    Cardiovascular:  Negative for chest pain.  Objective:    Physical Exam  BP 128/62 (BP Location: Left Arm, Patient Position: Sitting, Cuff Size: Normal)   Temp 97.8 F (36.6 C)   SpO2 97%  Wt Readings from Last 3 Encounters:  05/29/21 174 lb 9.6 oz (79.2 kg)  03/21/21 176 lb 6.4 oz (80 kg)  11/02/20 181 lb 6.4 oz (82.3 kg)   Diabetic Foot Exam - Simple   No data filed    Lab Results  Component Value Date   WBC 7.6 05/29/2021   HGB 13.7 05/29/2021   HCT 39.8 05/29/2021   PLT 287.0 05/29/2021   GLUCOSE 119 (H) 05/29/2021   CHOL 91 05/29/2021   TRIG 140.0 05/29/2021   HDL 32.90 (L) 05/29/2021   LDLDIRECT 41.0 04/20/2019   LDLCALC 30 05/29/2021   ALT 19 05/29/2021   AST 15 05/29/2021   NA 138 05/29/2021   K 3.6 05/29/2021   CL 101 05/29/2021   CREATININE 0.85 05/29/2021   BUN 14 05/29/2021   CO2 29 05/29/2021   TSH 1.45 05/29/2021   PSA 1.70ng/mL 02/19/2018   INR 1.05 05/25/2009   HGBA1C 6.6 (H) 05/29/2021   Lab Results  Component Value Date   TSH 1.45 05/29/2021   Lab Results  Component Value Date   WBC 7.6 05/29/2021   HGB 13.7 05/29/2021   HCT 39.8 05/29/2021   MCV 93.4 05/29/2021   PLT 287.0 05/29/2021   Lab Results  Component Value Date   NA 138 05/29/2021   K 3.6 05/29/2021   CO2 29 05/29/2021   GLUCOSE 119 (H) 05/29/2021   BUN 14 05/29/2021   CREATININE 0.85 05/29/2021   BILITOT 0.7 05/29/2021   ALKPHOS 69 05/29/2021   AST 15 05/29/2021   ALT 19 05/29/2021   PROT 6.7 05/29/2021   ALBUMIN 4.2 05/29/2021   CALCIUM 9.3 05/29/2021   ANIONGAP 12 09/30/2013   EGFR 89 03/28/2021   GFR 84.75 05/29/2021   Lab Results  Component Value Date   CHOL 91 05/29/2021   Lab Results  Component Value Date   HDL 32.90 (L) 05/29/2021   Lab Results  Component Value Date   LDLCALC 30 05/29/2021   Lab Results  Component Value Date   TRIG 140.0 05/29/2021   Lab Results  Component Value Date   CHOLHDL 3 05/29/2021   Lab Results  Component Value Date   HGBA1C 6.6 (H) 05/29/2021       Assessment & Plan:   Problem List Items Addressed This Visit     Hyperlipidemia, mixed    Tolerating statin, encouraged heart healthy diet, avoid trans fats, minimize simple carbs and saturated fats. Increase exercise as tolerated      Hypertension    Well controlled, no changes to meds. Encouraged heart healthy diet such as the DASH diet and exercise as tolerated.       GERD    Avoid offending foods, start probiotics. Do not eat large meals in late evening and consider raising head of bed.       Kidney stones    In March he had 18 kidney stones he is working with urology. He is on Potassium Citrate 2 tabs daily. He has an [place] [facility] appointment in January 24, 2022.      Hyperglycemia    hgba1c acceptable, minimize simple carbs. Increase exercise as tolerated.       No orders of the defined types were placed in this encounter.  I discussed the assessment and treatment plan with the patient. The  patient was provided an opportunity to ask questions and all were answered. The patient agreed with the plan and demonstrated an understanding of the instructions.   The patient was advised to call back or seek an in-person evaluation if the symptoms worsen or if the condition fails to improve as anticipated.  I provided 20 minutes of face-to-face time during this encounter.  I,Mohammed Iqbal,acting as a scribe for Penni Homans, MD.,have documented all relevant documentation on the behalf of Penni Homans, MD,as directed by  Penni Homans, MD while in the presence of Penni Homans, MD.  Penni Homans, MD [facility] at [facility] [phone] (phone) [phone] (fax)  [place]

## 2021-12-04 NOTE — Assessment & Plan Note (Signed)
In March he had 18 kidney stones he is working with urology. He is on Potassium Citrate 2 tabs daily. He has an [place] [facility] appointment in January 24, 2022.

## 2021-12-04 NOTE — Patient Instructions (Signed)
Kidney Stones  Kidney stones are solid, rock-like deposits that form inside of the kidneys. The kidneys are a pair of organs that make urine. A kidney stone may form in a kidney and move into other parts of the urinary tract, including the tubes that connect the kidneys to the bladder (ureters), the bladder, and the tube that carries urine out of the body (urethra). As the stone moves through these areas, it can cause intense pain and block the flow of urine. Kidney stones are created when high levels of certain minerals are found in the urine. The stones are usually passed out of the body through urination, but in some cases, medical treatment may be needed to remove them. What are the causes? Kidney stones may be caused by: A condition in which certain glands produce too much parathyroid hormone (primary hyperparathyroidism), which causes too much calcium buildup in the blood. A buildup of uric acid crystals in the bladder (hyperuricosuria). Uric acid is a chemical that the body produces when you eat certain foods. It usually leaves the body in the urine. Narrowing (stricture) of one or both of the ureters. A kidney blockage that is present at birth (congenital obstruction). Past surgery on the kidney or the ureters. What increases the risk? The following factors may make you more likely to develop this condition: Having had a kidney stone in the past. Having a family history of kidney stones. Not drinking enough water. Eating a diet that is high in protein, salt (sodium), or sugar. Being overweight or obese. What are the signs or symptoms? Symptoms of a kidney stone may include: Pain in the side of the abdomen, right below the ribs (flank pain). Pain usually spreads (radiates) to the groin. Needing to urinate often or urgently. Painful urination. Blood in the urine (hematuria). Nausea. Vomiting. Fever and chills. How is this diagnosed? This condition may be diagnosed based on: Your  symptoms and medical history. A physical exam. Blood tests. Urine tests. These may be done before and after the stone passes out of your body through urination. Imaging tests, such as a CT scan, abdominal X-ray, or ultrasound. A procedure to examine the inside of the bladder (cystoscopy). How is this treated? Treatment for kidney stones depends on the size, location, and makeup of the stones. Kidney stones will often pass out of the body through urination. You may need to: Increase your fluid intake to help pass the stone. In some cases, you may be given fluids through an IV and may need to be monitored in the hospital. Take medicine for pain. Make changes in your diet to help prevent kidney stones from coming back. Sometimes, procedures are needed to remove a kidney stone. This may involve: A procedure to break up kidney stones using: A focused beam of light (laser therapy). Shock waves (extracorporeal shock wave lithotripsy). Surgery to remove kidney stones. This may be needed if you have severe pain or have stones that block your urinary tract. Follow these instructions at home: Medicines Take over-the-counter and prescription medicines only as told by your health care provider. Ask your health care provider if the medicine prescribed to you requires you to avoid driving or using heavy machinery. Eating and drinking Drink enough fluid to keep your urine pale yellow. You may be instructed to drink at least 8-10 glasses of water each day. This will help you pass the kidney stone. If directed, change your diet. This may include: Limiting how much sodium you eat. Eating more fruits   and vegetables. Limiting how much animal protein you eat. Animal proteins include red meat, poultry, fish, and eggs. Eating a normal amount of calcium (1,000-1,300 mg per day). Follow instructions from your health care provider about eating or drinking restrictions. General instructions Collect urine samples as  told by your health care provider. You may need to collect a urine sample: 24 hours after you pass the stone. 8-12 weeks after you pass the kidney stone, and every 6-12 months after that. Strain your urine every time you urinate, for as long as directed. Use the strainer that your health care provider recommends. Do not throw out the kidney stone after passing it. Keep the stone so it can be tested by your health care provider. Testing the makeup of your kidney stone may help prevent you from getting kidney stones in the future. Keep all follow-up visits. You may need follow-up X-rays or ultrasounds to make sure that your stone has passed. How is this prevented? To prevent another kidney stone: Drink enough fluid to keep your urine pale yellow. This is the best way to prevent kidney stones. Eat a healthy diet. Follow recommendations from your health care provider about foods to avoid. Recommendations vary depending on the type of kidney stone that you have. You may be instructed to eat a low-protein diet. Maintain a healthy weight. Where to find more information National Kidney Foundation (NKF): www.kidney.org [facility] [employer] [facility] (UCF): www.urologyhealth.org Contact a health care provider if: You have pain that gets worse or does not get better with medicine. Get help right away if: You have a fever or chills. You develop severe pain. You develop new abdominal pain. You faint. You are unable to urinate. Summary Kidney stones are solid, rock-like deposits that form inside of the kidneys. Kidney stones can cause nausea, vomiting, blood in the urine, abdominal pain, and the urge to urinate often. Treatment for kidney stones depends on the size, location, and makeup of the stones. Kidney stones will often pass out of the body through urination. Kidney stones can be prevented by drinking enough fluids, eating a healthy diet, and maintaining a healthy weight. This information is not intended  to replace advice given to you by your health care provider. Make sure you discuss any questions you have with your health care provider. Document Revised: 06/06/2021 Document Reviewed: 06/06/2021 Elsevier Patient Education  2023 Elsevier Inc.  

## 2021-12-04 NOTE — Addendum Note (Signed)
Addended by: Laure Kidney on: 12/04/2021 11:03 AM   Modules accepted: Orders

## 2021-12-10 ENCOUNTER — Other Ambulatory Visit: Payer: Medicare Other

## 2021-12-13 DIAGNOSIS — L570 Actinic keratosis: Secondary | ICD-10-CM | POA: Diagnosis not present

## 2021-12-13 DIAGNOSIS — L720 Epidermal cyst: Secondary | ICD-10-CM | POA: Diagnosis not present

## 2021-12-14 ENCOUNTER — Other Ambulatory Visit (INDEPENDENT_AMBULATORY_CARE_PROVIDER_SITE_OTHER): Payer: Medicare Other

## 2021-12-14 ENCOUNTER — Ambulatory Visit (INDEPENDENT_AMBULATORY_CARE_PROVIDER_SITE_OTHER): Payer: Medicare Other | Admitting: *Deleted

## 2021-12-14 DIAGNOSIS — E782 Mixed hyperlipidemia: Secondary | ICD-10-CM

## 2021-12-14 DIAGNOSIS — I10 Essential (primary) hypertension: Secondary | ICD-10-CM | POA: Diagnosis not present

## 2021-12-14 DIAGNOSIS — R739 Hyperglycemia, unspecified: Secondary | ICD-10-CM

## 2021-12-14 DIAGNOSIS — Z23 Encounter for immunization: Secondary | ICD-10-CM

## 2021-12-14 LAB — CBC WITH DIFFERENTIAL/PLATELET
Basophils Absolute: 0 10*3/uL (ref 0.0–0.1)
Basophils Relative: 0.3 % (ref 0.0–3.0)
Eosinophils Absolute: 0.1 10*3/uL (ref 0.0–0.7)
Eosinophils Relative: 1.5 % (ref 0.0–5.0)
HCT: 42.1 % (ref 39.0–52.0)
Hemoglobin: 14.3 g/dL (ref 13.0–17.0)
Lymphocytes Relative: 32.1 % (ref 12.0–46.0)
Lymphs Abs: 2.3 10*3/uL (ref 0.7–4.0)
MCHC: 33.8 g/dL (ref 30.0–36.0)
MCV: 95.6 fl (ref 78.0–100.0)
Monocytes Absolute: 0.6 10*3/uL (ref 0.1–1.0)
Monocytes Relative: 8.8 % (ref 3.0–12.0)
Neutro Abs: 4.1 10*3/uL (ref 1.4–7.7)
Neutrophils Relative %: 57.3 % (ref 43.0–77.0)
Platelets: 224 10*3/uL (ref 150.0–400.0)
RBC: 4.41 Mil/uL (ref 4.22–5.81)
RDW: 12.9 % (ref 11.5–15.5)
WBC: 7.1 10*3/uL (ref 4.0–10.5)

## 2021-12-14 LAB — LIPID PANEL
Cholesterol: 99 mg/dL (ref 0–200)
HDL: 38 mg/dL — ABNORMAL LOW (ref >39.00)
LDL Cholesterol: 23 mg/dL (ref 0–99)
NonHDL: 60.83
Total CHOL/HDL Ratio: 3
Triglycerides: 188 mg/dL — ABNORMAL HIGH (ref 0.0–149.0)
VLDL: 37.6 mg/dL (ref 0.0–40.0)

## 2021-12-14 LAB — HEMOGLOBIN A1C: Hgb A1c MFr Bld: 6.5 % (ref 4.6–6.5)

## 2021-12-14 LAB — COMPREHENSIVE METABOLIC PANEL
ALT: 20 U/L (ref 0–53)
AST: 15 U/L (ref 0–37)
Albumin: 4.3 g/dL (ref 3.5–5.2)
Alkaline Phosphatase: 69 U/L (ref 39–117)
BUN: 14 mg/dL (ref 6–23)
CO2: 31 mEq/L (ref 19–32)
Calcium: 9.2 mg/dL (ref 8.4–10.5)
Chloride: 102 mEq/L (ref 96–112)
Creatinine, Ser: 0.84 mg/dL (ref 0.40–1.50)
GFR: 84.73 mL/min (ref >60.00)
Glucose, Bld: 135 mg/dL — ABNORMAL HIGH (ref 70–99)
Potassium: 3.6 mEq/L (ref 3.5–5.1)
Sodium: 140 mEq/L (ref 135–145)
Total Bilirubin: 0.7 mg/dL (ref 0.2–1.2)
Total Protein: 6.8 g/dL (ref 6.0–8.3)

## 2021-12-14 LAB — TSH: TSH: 2.44 u[IU]/mL (ref 0.35–5.50)

## 2021-12-14 NOTE — Progress Notes (Signed)
Patient here for high dose flu vaccine.  Vaccine given in left deltoid and patient tolerated well.  

## 2021-12-18 DIAGNOSIS — H353131 Nonexudative age-related macular degeneration, bilateral, early dry stage: Secondary | ICD-10-CM | POA: Diagnosis not present

## 2021-12-18 DIAGNOSIS — Z961 Presence of intraocular lens: Secondary | ICD-10-CM | POA: Diagnosis not present

## 2021-12-18 DIAGNOSIS — H35721 Serous detachment of retinal pigment epithelium, right eye: Secondary | ICD-10-CM | POA: Diagnosis not present

## 2021-12-18 DIAGNOSIS — H35371 Puckering of macula, right eye: Secondary | ICD-10-CM | POA: Diagnosis not present

## 2021-12-21 ENCOUNTER — Encounter: Payer: Self-pay | Admitting: Cardiology

## 2022-01-14 ENCOUNTER — Other Ambulatory Visit: Payer: Self-pay | Admitting: Cardiology

## 2022-01-14 DIAGNOSIS — I2510 Atherosclerotic heart disease of native coronary artery without angina pectoris: Secondary | ICD-10-CM

## 2022-01-14 DIAGNOSIS — I10 Essential (primary) hypertension: Secondary | ICD-10-CM

## 2022-01-24 DIAGNOSIS — N182 Chronic kidney disease, stage 2 (mild): Secondary | ICD-10-CM | POA: Diagnosis not present

## 2022-01-24 DIAGNOSIS — I129 Hypertensive chronic kidney disease with stage 1 through stage 4 chronic kidney disease, or unspecified chronic kidney disease: Secondary | ICD-10-CM | POA: Diagnosis not present

## 2022-01-24 DIAGNOSIS — I10 Essential (primary) hypertension: Secondary | ICD-10-CM | POA: Diagnosis not present

## 2022-01-24 DIAGNOSIS — E782 Mixed hyperlipidemia: Secondary | ICD-10-CM | POA: Diagnosis not present

## 2022-01-24 DIAGNOSIS — N200 Calculus of kidney: Secondary | ICD-10-CM | POA: Diagnosis not present

## 2022-01-24 DIAGNOSIS — N138 Other obstructive and reflux uropathy: Secondary | ICD-10-CM | POA: Diagnosis not present

## 2022-01-24 DIAGNOSIS — N401 Enlarged prostate with lower urinary tract symptoms: Secondary | ICD-10-CM | POA: Diagnosis not present

## 2022-01-30 ENCOUNTER — Other Ambulatory Visit (HOSPITAL_BASED_OUTPATIENT_CLINIC_OR_DEPARTMENT_OTHER): Payer: Self-pay

## 2022-01-30 MED ORDER — HYDROCHLOROTHIAZIDE 25 MG PO TABS
25.0000 mg | ORAL_TABLET | Freq: Every day | ORAL | 3 refills | Status: DC
  Filled 2022-01-30: qty 90, 90d supply, fill #0

## 2022-02-08 ENCOUNTER — Other Ambulatory Visit (HOSPITAL_BASED_OUTPATIENT_CLINIC_OR_DEPARTMENT_OTHER): Payer: Self-pay

## 2022-02-11 DIAGNOSIS — M545 Low back pain, unspecified: Secondary | ICD-10-CM | POA: Diagnosis not present

## 2022-03-14 DIAGNOSIS — N200 Calculus of kidney: Secondary | ICD-10-CM | POA: Diagnosis not present

## 2022-03-14 DIAGNOSIS — R82991 Hypocitraturia: Secondary | ICD-10-CM | POA: Diagnosis not present

## 2022-03-19 DIAGNOSIS — Z961 Presence of intraocular lens: Secondary | ICD-10-CM | POA: Diagnosis not present

## 2022-03-19 DIAGNOSIS — H353131 Nonexudative age-related macular degeneration, bilateral, early dry stage: Secondary | ICD-10-CM | POA: Diagnosis not present

## 2022-04-14 ENCOUNTER — Other Ambulatory Visit: Payer: Self-pay | Admitting: Family Medicine

## 2022-04-14 DIAGNOSIS — I10 Essential (primary) hypertension: Secondary | ICD-10-CM

## 2022-04-15 NOTE — Progress Notes (Signed)
HPI: FU CAD; admitted in March 2011 with a non-ST elevation myocardial infarction. He underwent cardiac catheterization which revealed - Left main was normal. The LAD had proximal calcification. There was long proximal 30% stenosis. Circumflex in the AV groove had proximal 60% stenosis at a mid obtuse marginal. This first large mid obtuse marginal had ostial 70% stenosis. The right coronary artery had a proximal long 50% stenosis. There was mid subtotal stenosis. The EF was 50% with inferior hypokinesis. Patient had a drug-eluting stent to the right coronary artery at that time. Nuclear study in December of 2013 showed an ejection fraction of 71% and normal perfusion. Carotid Dopplers in Jan 2017 showed 1-39 bilateral stenosis.  Abdominal ultrasound January 2020 showed no aneurysm. Venous Dopplers right lower extremity September 2021 showed no DVT.  Since I last saw him, the patient denies any dyspnea on exertion, orthopnea, PND, pedal edema, palpitations, syncope or chest pain.   Current Outpatient Medications  Medication Sig Dispense Refill   amLODipine (NORVASC) 10 MG tablet TAKE 1 TABLET BY MOUTH IN THE  MORNING 90 tablet 3   ascorbic acid (VITAMIN C) 500 MG tablet Take 500 mg by mouth daily.     aspirin 81 MG tablet Take 81 mg by mouth every morning.     gabapentin (NEURONTIN) 300 MG capsule Take 300 mg by mouth 2 (two) times daily. One capsule at lunch and two capsule at bedtime     hydrochlorothiazide (MICROZIDE) 12.5 MG capsule TAKE 1 CAPSULE BY MOUTH DAILY 90 capsule 3   losartan (COZAAR) 100 MG tablet TAKE 1 TABLET BY MOUTH IN THE  MORNING 90 tablet 3   metoprolol tartrate (LOPRESSOR) 25 MG tablet TAKE ONE-HALF TABLET BY MOUTH  TWICE DAILY 90 tablet 3   Multiple Vitamins-Minerals (ICAPS PO) Take by mouth every morning.      potassium citrate (UROCIT-K) 10 MEQ (1080 MG) SR tablet Take 10 mEq by mouth in the morning and at bedtime.   3   rosuvastatin (CRESTOR) 40 MG tablet TAKE 1 TABLET  BY MOUTH DAILY 90 tablet 3   hydrochlorothiazide (HYDRODIURIL) 25 MG tablet Take 1 tablet (25 mg total) by mouth daily. (Patient not taking: Reported on 04/29/2022) 90 tablet 3   No current facility-administered medications for this visit.     Past Medical History:  Diagnosis Date   Allergic rhinitis    BPH (benign prostatic hyperplasia)    CAD (coronary artery disease) 05/26/2009 :  primary cardiolgoist-  dr Stanford Breed   hx NSTEMI cardiac catherization  revealed-left main normal; LAD proximal calcification,  long proximal 30% stenosis,  Circumflex  AV groove   proximal  60% stenosis at mid obtuse marginal,  first large mid obtuse marginal  ostial 70% stenosis,  prox. to mid RCA  99% stenosis w/ intervention PCI and DES   Carotid stenosis, asymptomatic, bilateral    per last duplex 03-31-2015  bilateral ICA 1-39%   Cataract    removed   External hemorrhoids    large external   GERD (gastroesophageal reflux disease)    Glaucoma    History of adenomatous polyp of colon    History of kidney stones    History of non-ST elevation myocardial infarction (NSTEMI) 05/26/2009   Hyperlipidemia    Hypertension    8-10 years   Left ureteral stone    Macular degeneration    Myocardial infarction [facility])    Nephrolithiasis    right side non-obstructive per CT 01-03-2017   S/P drug  eluting coronary stent placement 05/26/2009   DESx1  to pRCA   Wears glasses     Past Surgical History:  Procedure Laterality Date   CARDIOVASCULAR STRESS TEST  12/26/2011   dr Stanford Breed   normal nuclear study w/ no ischemia/  normal LV function and wall motion , ef 71%   CATARACT EXTRACTION W/ INTRAOCULAR LENS  IMPLANT, BILATERAL  2010   COLONOSCOPY  last one 06-10-2016   CORONARY ANGIOPLASTY WITH STENT PLACEMENT  05-26-2009  dr hochrein/ dr Darnell Level brodie   Severe RCA stenosis and moderate stenosis in the left system, ef 50% with inferior hypokinsis:  PCI and DES x1 to RCA ([facility])   CYSTOSCOPY W/ RETROGRADES  Bilateral 09/29/2013   Procedure: CYSTOSCOPY WITH RETROGRADE PYELOGRAM;  Surgeon: Alexis Frock, MD;  Location: [facility];  Service: Urology;  Laterality: Bilateral;   CYSTOSCOPY WITH RETROGRADE PYELOGRAM, URETEROSCOPY AND STENT PLACEMENT N/A 06/14/2015   Procedure: CYSTOSCOPY WITH RETROGRADE PYELOGRAM, cysto litholapexy ;  Surgeon: Alexis Frock, MD;  Location: [facility];  Service: Urology;  Laterality: N/A;   CYSTOSCOPY/URETEROSCOPY/HOLMIUM LASER/STENT PLACEMENT Left 01/07/2017   Procedure: CYSTOSCOPY/RETROGRADE/URETEROSCOPY/HOLMIUM LASER/STENT PLACEMENT, FULGERATION OF PROSTATIC URETHRA;  Surgeon: Ceasar Mons, MD;  Location: [facility];  Service: Urology;  Laterality: Left;   CYSTOSCOPY/URETEROSCOPY/HOLMIUM LASER/STENT PLACEMENT Right 01/12/2020   Procedure: CYSTOSCOPY/ RIGHT RETROGRADE/ RIGHT URETEROSCOPY/ RIGHT HOLMIUM LASER/ RIGHT STENT PLACEMENT;  Surgeon: Ceasar Mons, MD;  Location[facility];  Service: Urology;  Laterality: Right;  ONLY NEEDS 60 MIN   EYE SURGERY     HOLMIUM LASER APPLICATION N/A [id number]   Procedure: HOLMIUM LASER APPLICATION bladder stone;  Surgeon: Alexis Frock, MD;  Location: [facility];  Service: Urology;  Laterality: N/A;   HOLMIUM LASER APPLICATION Left [id number]   Procedure: HOLMIUM LASER APPLICATION;  Surgeon: Ceasar Mons, MD;  [facility];  Service: Urology;  Laterality: Left;   NEPHROLITHOTOMY Left 09/29/2013   Procedure: NEPHROLITHOTOMY PERCUTANEOUS WITH ACCESS;  Surgeon: Alexis Frock, MD;  Location: [facility];  Service: Urology;  Laterality: Left;   URETEROSCOPY N/A 09/29/2013   Procedure: URETEROSCOPY;  Surgeon: Alexis Frock, MD;  Location: [facility];  Service: Urology;  Laterality: N/A;    Social History   Socioeconomic History   Marital status: Married    Spouse name: Not on file   Number of children: Not on file    Years of education: Not on file   Highest education level: Not on file  Occupational History   Not on file  Tobacco Use   Smoking status: Former    Years: 45.00    Types: Cigarettes    Quit date: 03/12/2007    Years since quitting: 15.1   Smokeless tobacco: Never  Substance and Sexual Activity   Alcohol use: No   Drug use: No   Sexual activity: Yes    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  He had     a 40 pack-year smoking history but has discontinued.     prev occuptation -  [employer]   Alcohol use-no   Smoking Status:  quit   Packs/Day:  0.5   Caffeine use/day:  2 beverages daily  Does Patient Exercise:  yes            Social Determinants of Health   Financial Resource Strain: Low Risk  (09/14/2020)   Overall Financial Resource Strain (CARDIA)    Difficulty of Paying Living Expenses: Not hard at all  Food Insecurity: No Food Insecurity (09/14/2020)   Hunger Vital Sign    Worried About Running Out of Food in the Last Year: Never true    [place] in the Last Year: Never true  Transportation Needs: No Transportation Needs (09/14/2020)   PRAPARE - Hydrologist (Medical): No    Lack of Transportation (Non-Medical): No  Physical Activity: Sufficiently Active (09/14/2020)   Exercise Vital Sign    Days of Exercise per Week: 7 days    Minutes of Exercise per Session: 30 min  Stress: No Stress Concern Present (09/14/2020)   [place]    Feeling of Stress : Not at all  Social Connections: Moderately Isolated (09/14/2020)   Social Connection and Isolation Panel [NHANES]    Frequency of Communication with Friends and Family: More than three times a week    Frequency of  Social Gatherings with Friends and Family: More than three times a week    Attends Religious Services: Never    Marine scientist or Organizations: No    Attends Archivist Meetings: Never    Marital Status: Married  Human resources officer Violence: Not At Risk (09/14/2020)   Humiliation, Afraid, Rape, and Kick questionnaire    Fear of Current or Ex-Partner: No    Emotionally Abused: No    Physically Abused: No    Sexually Abused: No    Family History  Problem Relation Age of Onset   Coronary artery disease Father        in his 2's   Hyperlipidemia Father    Hypertension Father    Hearing loss Father    Heart disease Mother        deceased of heart disease   Hypertension Mother    Hyperlipidemia Mother    Multiple sclerosis Son    Heart disease Son    Heart disease Maternal Grandmother    Heart disease Maternal Grandfather    Heart disease Paternal Grandmother    Heart disease Paternal Grandfather    GER disease Sister    Heart disease Paternal Aunt    Other Neg Hx        no lung cancer, colon cancer, or prostate   Colon cancer Neg Hx    Stomach cancer Neg Hx    Esophageal cancer Neg Hx     ROS: no fevers or chills, productive cough, hemoptysis, dysphasia, odynophagia, melena, hematochezia, dysuria, hematuria, rash, seizure activity, orthopnea, PND, pedal edema, claudication. Remaining systems are negative.  Physical Exam: Well-developed well-nourished in no acute distress.  Skin is warm and dry.  HEENT is normal.  Neck is supple.  Chest is clear to auscultation with normal expansion.  Cardiovascular exam is regular rate and rhythm.  Abdominal exam nontender or distended. No masses palpated. Extremities show no edema. neuro grossly intact  ECG-normal sinus rhythm at a rate of 67, no significant ST changes.  Personally reviewed  A/P  1 coronary artery disease-patient is doing well from a symptomatic standpoint with no chest pain.  Continue aspirin and  statin.  2 carotid artery disease-mild on most recent Dopplers.  3 hyperlipidemia-continue statin.  4  hypertension-patient's blood pressure is elevated; however typically controlled.  Continue present medications and follow-up.  Kirk Ruths, MD

## 2022-04-23 ENCOUNTER — Other Ambulatory Visit (HOSPITAL_COMMUNITY): Payer: Self-pay

## 2022-04-29 ENCOUNTER — Ambulatory Visit: Payer: Medicare Other | Admitting: Cardiology

## 2022-04-29 ENCOUNTER — Encounter: Payer: Self-pay | Admitting: Cardiology

## 2022-04-29 VITALS — BP 140/72 | HR 67 | Ht 66.0 in | Wt 181.4 lb

## 2022-04-29 DIAGNOSIS — I10 Essential (primary) hypertension: Secondary | ICD-10-CM | POA: Diagnosis not present

## 2022-04-29 DIAGNOSIS — E7800 Pure hypercholesterolemia, unspecified: Secondary | ICD-10-CM

## 2022-04-29 DIAGNOSIS — I25119 Atherosclerotic heart disease of native coronary artery with unspecified angina pectoris: Secondary | ICD-10-CM

## 2022-04-29 NOTE — Patient Instructions (Signed)
    Follow-Up: At [facility], you and your health needs are our priority.  As part of our continuing mission to provide you with exceptional heart care, we have created designated Provider Care Teams.  These Care Teams include your primary Cardiologist (physician) and Advanced Practice Providers (APPs -  Physician Assistants and Nurse Practitioners) who all work together to provide you with the care you need, when you need it.  We recommend signing up for the patient portal called "MyChart".  Sign up information is provided on this After Visit Summary.  MyChart is used to connect with patients for Virtual Visits (Telemedicine).  Patients are able to view lab/test results, encounter notes, upcoming appointments, etc.  Non-urgent messages can be sent to your provider as well.   To learn more about what you can do with MyChart, go to [email or web address].    Your next appointment:   12 month(s)  Provider:   Brian Crenshaw, MD     

## 2022-05-15 NOTE — Assessment & Plan Note (Signed)
Tolerating statin, encouraged heart healthy diet, avoid trans fats, minimize simple carbs and saturated fats. Increase exercise as tolerated 

## 2022-05-15 NOTE — Assessment & Plan Note (Signed)
Well controlled, no changes to meds. Encouraged heart healthy diet such as the DASH diet and exercise as tolerated.  °

## 2022-05-15 NOTE — Assessment & Plan Note (Signed)
hgba1c acceptable, minimize simple carbs. Increase exercise as tolerated.  

## 2022-05-16 ENCOUNTER — Ambulatory Visit (INDEPENDENT_AMBULATORY_CARE_PROVIDER_SITE_OTHER): Payer: Medicare Other | Admitting: Family Medicine

## 2022-05-16 ENCOUNTER — Encounter: Payer: Self-pay | Admitting: Family Medicine

## 2022-05-16 VITALS — BP 136/74 | HR 66 | Temp 97.5°F | Resp 16 | Ht 65.0 in | Wt 181.0 lb

## 2022-05-16 DIAGNOSIS — I10 Essential (primary) hypertension: Secondary | ICD-10-CM | POA: Diagnosis not present

## 2022-05-16 DIAGNOSIS — E782 Mixed hyperlipidemia: Secondary | ICD-10-CM | POA: Diagnosis not present

## 2022-05-16 DIAGNOSIS — R739 Hyperglycemia, unspecified: Secondary | ICD-10-CM

## 2022-05-16 NOTE — Progress Notes (Signed)
Subjective:   By signing my name below, I, Anthony [name], attest that this documentation has been prepared under the direction and in the presence of Anthony Lukes, MD., 05/16/2022.   Patient ID: Anthony [name], male    DOB: 04-23-1945, 77 y.o.   MRN: [id number]  Chief Complaint  Patient presents with   Follow-up    Follow up   HPI Patient is in today for an office visit. He denies CP/palpitations/SOB/HA/congestion/ fever/chills/GI or GU symptoms.  Coronary Artery Disease Patient was seen by cardiologist Dr. Stanford Breed on 04/29/2022 to manage his CAD. His condition is stable and he denies chest pain/palpitations. He continues taking Aspirin 81 mg and Rosuvastatin 40 mg.  Lower Back Pain He reports that occasionally he experiences degenerative disk disease associated lower back pain upon exertion. He was seen by Dr. Gladstone Lighter at [facility] on 02/11/2022 who recommended that he use Tylenol Extra Strength tid prn.   Past Medical History:  Diagnosis Date   Allergic rhinitis    BPH (benign prostatic hyperplasia)    CAD (coronary artery disease) 05/26/2009 :  primary cardiolgoist-  dr Stanford Breed   hx NSTEMI cardiac catherization  revealed-left main normal; LAD proximal calcification,  long proximal 30% stenosis,  Circumflex  AV groove   proximal  60% stenosis at mid obtuse marginal,  first large mid obtuse marginal  ostial 70% stenosis,  prox. to mid RCA  99% stenosis w/ intervention PCI and DES   Carotid stenosis, asymptomatic, bilateral    per last duplex 03-31-2015  bilateral ICA 1-39%   Cataract    removed   External hemorrhoids    large external   GERD (gastroesophageal reflux disease)    Glaucoma    History of adenomatous polyp of colon    History of kidney stones    History of non-ST elevation myocardial infarction (NSTEMI) 05/26/2009   Hyperlipidemia    Hypertension    8-10 years   Left ureteral stone    Macular degeneration    Myocardial infarction [facility])     Nephrolithiasis    right side non-obstructive per CT 01-03-2017   S/P drug eluting coronary stent placement 05/26/2009   DESx1  to pRCA   Wears glasses     Past Surgical History:  Procedure Laterality Date   CARDIOVASCULAR STRESS TEST  12/26/2011   dr Stanford Breed   normal nuclear study w/ no ischemia/  normal LV function and wall motion , ef 71%   CATARACT EXTRACTION W/ INTRAOCULAR LENS  IMPLANT, BILATERAL  2010   COLONOSCOPY  last one 06-10-2016   CORONARY ANGIOPLASTY WITH STENT PLACEMENT  05-26-2009  dr hochrein/ dr Darnell Level brodie   Severe RCA stenosis and moderate stenosis in the left system, ef 50% with inferior hypokinsis:  PCI and DES x1 to RCA ([facility])   CYSTOSCOPY W/ RETROGRADES Bilateral 09/29/2013   Procedure: CYSTOSCOPY WITH RETROGRADE PYELOGRAM;  Surgeon: Alexis Frock, MD;  Location: [facility];  Service: Urology;  Laterality: Bilateral;   CYSTOSCOPY WITH RETROGRADE PYELOGRAM, URETEROSCOPY AND STENT PLACEMENT N/A 06/14/2015   Procedure: CYSTOSCOPY WITH RETROGRADE PYELOGRAM, cysto litholapexy ;  Surgeon: Alexis Frock, MD;  Location: [facility];  Service: Urology;  Laterality: N/A;   CYSTOSCOPY/URETEROSCOPY/HOLMIUM LASER/STENT PLACEMENT Left 01/07/2017   Procedure: CYSTOSCOPY/RETROGRADE/URETEROSCOPY/HOLMIUM LASER/STENT PLACEMENT, FULGERATION OF PROSTATIC URETHRA;  Surgeon: Ceasar Mons, MD;  Location: [facility];  Service: Urology;  Laterality: Left;   CYSTOSCOPY/URETEROSCOPY/HOLMIUM LASER/STENT PLACEMENT Right 01/12/2020   Procedure: CYSTOSCOPY/ RIGHT RETROGRADE/ RIGHT URETEROSCOPY/ RIGHT HOLMIUM LASER/  RIGHT STENT PLACEMENT;  Surgeon: Ceasar Mons, MD;  Location: [facility];  Service: Urology;  Laterality: Right;  ONLY NEEDS 60 MIN   EYE SURGERY     HOLMIUM LASER APPLICATION N/A [id number]   Procedure: HOLMIUM LASER APPLICATION bladder stone;  Surgeon: Alexis Frock, MD;  Location: [facility];   Service: Urology;  Laterality: N/A;   HOLMIUM LASER APPLICATION Left [id number]   Procedure: HOLMIUM LASER APPLICATION;  Surgeon: Ceasar Mons, MD;  Location: [facility];  Service: Urology;  Laterality: Left;   NEPHROLITHOTOMY Left 09/29/2013   Procedure: NEPHROLITHOTOMY PERCUTANEOUS WITH ACCESS;  Surgeon: Alexis Frock, MD;  Location: [facility];  Service: Urology;  Laterality: Left;   URETEROSCOPY N/A 09/29/2013   Procedure: URETEROSCOPY;  Surgeon: Alexis Frock, MD;  Location: [facility];  Service: Urology;  Laterality: N/A;    Family History  Problem Relation Age of Onset   Coronary artery disease Father        in his 66's   Hyperlipidemia Father    Hypertension Father    Hearing loss Father    Heart disease Mother        deceased of heart disease   Hypertension Mother    Hyperlipidemia Mother    Multiple sclerosis Son    Heart disease Son    Heart disease Maternal Grandmother    Heart disease Maternal Grandfather    Heart disease Paternal Grandmother    Heart disease Paternal Grandfather    GER disease Sister    Heart disease Paternal Aunt    Other Neg Hx        no lung cancer, colon cancer, or prostate   Colon cancer Neg Hx    Stomach cancer Neg Hx    Esophageal cancer Neg Hx     Social History   Socioeconomic History   Marital status: Married    Spouse name: Not on file   Number of children: Not on file   Years of education: Not on file   Highest education level: Not on file  Occupational History   Not on file  Tobacco Use   Smoking status: Former    Years: 45.00    Types: Cigarettes    Quit date: 03/12/2007    Years since quitting: 15.1   Smokeless tobacco: Never  Substance and Sexual Activity   Alcohol use: No   Drug use: No   Sexual activity: Yes    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  He had     a 40 pack-year smoking history but has discontinued.     prev occuptation -  [employer]   Alcohol use-no   Smoking Status:  quit   Packs/Day:  0.5   Caffeine use/day:  2 beverages daily   Does Patient Exercise:  yes            Social Determinants of Health   Financial Resource Strain: Low Risk  (09/14/2020)   Overall Financial Resource Strain (CARDIA)    Difficulty of Paying Living Expenses: Not hard at all  Food Insecurity: No Food Insecurity (09/14/2020)   Hunger Vital Sign    Worried About Running Out of Food in the Last Year:  Never true    Ran Out of Food in the Last Year: Never true  Transportation Needs: No Transportation Needs (09/14/2020)   PRAPARE - Hydrologist (Medical): No    Lack of Transportation (Non-Medical): No  Physical Activity: Sufficiently Active (09/14/2020)   Exercise Vital Sign    Days of Exercise per Week: 7 days    Minutes of Exercise per Session: 30 min  Stress: No Stress Concern Present (09/14/2020)   [place]    Feeling of Stress : Not at all  Social Connections: Moderately Isolated (09/14/2020)   Social Connection and Isolation Panel [NHANES]    Frequency of Communication with Friends and Family: More than three times a week    Frequency of Social Gatherings with Friends and Family: More than three times a week    Attends Religious Services: Never    Marine scientist or Organizations: No    Attends Archivist Meetings: Never    Marital Status: Married  Human resources officer Violence: Not At Risk (09/14/2020)   Humiliation, Afraid, Rape, and Kick questionnaire    Fear of Current or Ex-Partner: No    Emotionally Abused: No    Physically Abused: No    Sexually Abused: No    Outpatient Medications Prior to Visit  Medication Sig Dispense Refill   amLODipine  (NORVASC) 10 MG tablet TAKE 1 TABLET BY MOUTH IN THE  MORNING 90 tablet 3   ascorbic acid (VITAMIN C) 500 MG tablet Take 500 mg by mouth daily.     aspirin 81 MG tablet Take 81 mg by mouth every morning.     gabapentin (NEURONTIN) 300 MG capsule Take 300 mg by mouth 2 (two) times daily. One capsule at lunch and two capsule at bedtime     hydrochlorothiazide (MICROZIDE) 12.5 MG capsule TAKE 1 CAPSULE BY MOUTH DAILY 90 capsule 3   losartan (COZAAR) 100 MG tablet TAKE 1 TABLET BY MOUTH IN THE  MORNING 90 tablet 3   metoprolol tartrate (LOPRESSOR) 25 MG tablet TAKE ONE-HALF TABLET BY MOUTH  TWICE DAILY 90 tablet 3   Multiple Vitamins-Minerals (ICAPS PO) Take by mouth every morning.      potassium citrate (UROCIT-K) 10 MEQ (1080 MG) SR tablet Take 10 mEq by mouth in the morning and at bedtime.   3   rosuvastatin (CRESTOR) 40 MG tablet TAKE 1 TABLET BY MOUTH DAILY 90 tablet 3   hydrochlorothiazide (HYDRODIURIL) 25 MG tablet Take 1 tablet (25 mg total) by mouth daily. 90 tablet 3   No facility-administered medications prior to visit.    No Known Allergies  Review of Systems  Constitutional:  Negative for chills and fever.  HENT:  Negative for congestion.   Respiratory:  Negative for shortness of breath.   Cardiovascular:  Negative for chest pain and palpitations.  Gastrointestinal:  Negative for abdominal pain, blood in stool, constipation, diarrhea, nausea and vomiting.  Genitourinary:  Negative for dysuria, frequency, hematuria and urgency.  Skin:           Neurological:  Negative for headaches.       Objective:    Physical Exam Constitutional:      General: He is not in acute distress.    Appearance: Normal appearance. He is normal weight. He is not ill-appearing.  HENT:     Head: Normocephalic and atraumatic.     Right Ear: External ear normal.     Left  Ear: External ear normal.     Nose: Nose normal.     Mouth/Throat:     Mouth: Mucous membranes are moist.     Pharynx:  Oropharynx is clear.  Eyes:     General:        Right eye: No discharge.        Left eye: No discharge.     Extraocular Movements: Extraocular movements intact.     Conjunctiva/sclera: Conjunctivae normal.     Pupils: Pupils are equal, round, and reactive to light.  Cardiovascular:     Rate and Rhythm: Normal rate and regular rhythm.     Pulses: Normal pulses.     Heart sounds: Normal heart sounds. No murmur heard.    No gallop.  Pulmonary:     Effort: Pulmonary effort is normal. No respiratory distress.     Breath sounds: Normal breath sounds. No wheezing or rales.  Abdominal:     General: Bowel sounds are normal.     Palpations: Abdomen is soft.     Tenderness: There is no abdominal tenderness. There is no guarding.  Musculoskeletal:        General: Normal range of motion.     Cervical back: Normal range of motion.     Right lower leg: No edema.     Left lower leg: No edema.  Skin:    General: Skin is warm and dry.  Neurological:     Mental Status: He is alert and oriented to person, place, and time.  Psychiatric:        Mood and Affect: Mood normal.        Behavior: Behavior normal.        Judgment: Judgment normal.     BP 136/74 (BP Location: Right Arm, Patient Position: Sitting, Cuff Size: Normal)   Pulse 66   Temp (!) 97.5 F (36.4 C) (Oral)   Resp 16   Ht '5\' 5"'$  (1.651 m)   Wt 181 lb (82.1 kg)   SpO2 95%   BMI 30.12 kg/m  Wt Readings from Last 3 Encounters:  05/16/22 181 lb (82.1 kg)  04/29/22 181 lb 6.4 oz (82.3 kg)  05/29/21 174 lb 9.6 oz (79.2 kg)    Diabetic Foot Exam - Simple   No data filed    Lab Results  Component Value Date   WBC 7.1 12/14/2021   HGB 14.3 12/14/2021   HCT 42.1 12/14/2021   PLT 224.0 12/14/2021   GLUCOSE 135 (H) 12/14/2021   CHOL 99 12/14/2021   TRIG 188.0 (H) 12/14/2021   HDL 38.00 (L) 12/14/2021   LDLDIRECT 41.0 04/20/2019   LDLCALC 23 12/14/2021   ALT 20 12/14/2021   AST 15 12/14/2021   NA 140 12/14/2021   K 3.6  12/14/2021   CL 102 12/14/2021   CREATININE 0.84 12/14/2021   BUN 14 12/14/2021   CO2 31 12/14/2021   TSH 2.44 12/14/2021   PSA 1.70ng/mL 02/19/2018   INR 1.05 05/25/2009   HGBA1C 6.5 12/14/2021    Lab Results  Component Value Date   TSH 2.44 12/14/2021   Lab Results  Component Value Date   WBC 7.1 12/14/2021   HGB 14.3 12/14/2021   HCT 42.1 12/14/2021   MCV 95.6 12/14/2021   PLT 224.0 12/14/2021   Lab Results  Component Value Date   NA 140 12/14/2021   K 3.6 12/14/2021   CO2 31 12/14/2021   GLUCOSE 135 (H) 12/14/2021   BUN 14 12/14/2021   CREATININE  0.84 12/14/2021   BILITOT 0.7 12/14/2021   ALKPHOS 69 12/14/2021   AST 15 12/14/2021   ALT 20 12/14/2021   PROT 6.8 12/14/2021   ALBUMIN 4.3 12/14/2021   CALCIUM 9.2 12/14/2021   ANIONGAP 12 09/30/2013   EGFR 89 03/28/2021   GFR 84.73 12/14/2021   Lab Results  Component Value Date   CHOL 99 12/14/2021   Lab Results  Component Value Date   HDL 38.00 (L) 12/14/2021   Lab Results  Component Value Date   LDLCALC 23 12/14/2021   Lab Results  Component Value Date   TRIG 188.0 (H) 12/14/2021   Lab Results  Component Value Date   CHOLHDL 3 12/14/2021   Lab Results  Component Value Date   HGBA1C 6.5 12/14/2021      Assessment & Plan:  Healthy Lifestyle: Encouraged 6-8 hours of sleep, heart healthy diet, 60-80 oz of non-alcohol/non-caffeinated fluids, and minimum of 4000 steps daily.  Immunizations: Encouraged annual COVID-19 and Flu vaccinations as well as RSV vaccination.  Labs: Routine blood work ordered.  Problem List Items Addressed This Visit     Hyperglycemia - Primary    hgba1c acceptable, minimize simple carbs. Increase exercise as tolerated.       Hyperlipidemia, mixed    Tolerating statin, encouraged heart healthy diet, avoid trans fats, minimize simple carbs and saturated fats. Increase exercise as tolerated      Hypertension    Well controlled, no changes to meds. Encouraged heart  healthy diet such as the DASH diet and exercise as tolerated.        No orders of the defined types were placed in this encounter.  I, Penni Homans, MD, personally preformed the services described in this documentation.  All medical record entries made by the scribe were at my direction and in my presence.  I have reviewed the chart and discharge instructions (if applicable) and agree that the record reflects my personal performance and is accurate and complete. 05/16/2022  I,Mohammed Iqbal,acting as a scribe for Penni Homans, MD.,have documented all relevant documentation on the behalf of Penni Homans, MD,as directed by  Penni Homans, MD while in the presence of Penni Homans, MD.  Penni Homans, MD

## 2022-05-16 NOTE — Patient Instructions (Addendum)
RSV, Respiratory Syncitial Virus Vaccine, Arexvy Vaccine at pharmacy  Hypertension, Adult High blood pressure (hypertension) is when the force of blood pumping through the arteries is too strong. The arteries are the blood vessels that carry blood from the heart throughout the body. Hypertension forces the heart to work harder to pump blood and may cause arteries to become narrow or stiff. Untreated or uncontrolled hypertension can lead to a heart attack, heart failure, a stroke, kidney disease, and other problems. A blood pressure reading consists of a higher number over a lower number. Ideally, your blood pressure should be below 120/80. The first ("top") number is called the systolic pressure. It is a measure of the pressure in your arteries as your heart beats. The second ("bottom") number is called the diastolic pressure. It is a measure of the pressure in your arteries as the heart relaxes. What are the causes? The exact cause of this condition is not known. There are some conditions that result in high blood pressure. What increases the risk? Certain factors may make you more likely to develop high blood pressure. Some of these risk factors are under your control, including: Smoking. Not getting enough exercise or physical activity. Being overweight. Having too much fat, sugar, calories, or salt (sodium) in your diet. Drinking too much alcohol. Other risk factors include: Having a personal history of heart disease, diabetes, high cholesterol, or kidney disease. Stress. Having a family history of high blood pressure and high cholesterol. Having obstructive sleep apnea. Age. The risk increases with age. What are the signs or symptoms? High blood pressure may not cause symptoms. Very high blood pressure (hypertensive crisis) may cause: Headache. Fast or irregular heartbeats (palpitations). Shortness of breath. Nosebleed. Nausea and vomiting. Vision changes. Severe chest pain,  dizziness, and seizures. How is this diagnosed? This condition is diagnosed by measuring your blood pressure while you are seated, with your arm resting on a flat surface, your legs uncrossed, and your feet flat on the floor. The cuff of the blood pressure monitor will be placed directly against the skin of your upper arm at the level of your heart. Blood pressure should be measured at least twice using the same arm. Certain conditions can cause a difference in blood pressure between your right and left arms. If you have a high blood pressure reading during one visit or you have normal blood pressure with other risk factors, you may be asked to: Return on a different day to have your blood pressure checked again. Monitor your blood pressure at home for 1 week or longer. If you are diagnosed with hypertension, you may have other blood or imaging tests to help your health care provider understand your overall risk for other conditions. How is this treated? This condition is treated by making healthy lifestyle changes, such as eating healthy foods, exercising more, and reducing your alcohol intake. You may be referred for counseling on a healthy diet and physical activity. Your health care provider may prescribe medicine if lifestyle changes are not enough to get your blood pressure under control and if: Your systolic blood pressure is above 130. Your diastolic blood pressure is above 80. Your personal target blood pressure may vary depending on your medical conditions, your age, and other factors. Follow these instructions at home: Eating and drinking  Eat a diet that is high in fiber and potassium, and low in sodium, added sugar, and fat. An example of this eating plan is called the DASH diet. DASH stands for  Dietary Approaches to Stop Hypertension. To eat this way: Eat plenty of fresh fruits and vegetables. Try to fill one half of your plate at each meal with fruits and vegetables. Eat whole  grains, such as whole-wheat pasta, brown rice, or whole-grain bread. Fill about one fourth of your plate with whole grains. Eat or drink low-fat dairy products, such as skim milk or low-fat yogurt. Avoid fatty cuts of meat, processed or cured meats, and poultry with skin. Fill about one fourth of your plate with lean proteins, such as fish, chicken without skin, beans, eggs, or tofu. Avoid pre-made and processed foods. These tend to be higher in sodium, added sugar, and fat. Reduce your daily sodium intake. Many people with hypertension should eat less than 1,500 mg of sodium a day. Do not drink alcohol if: Your health care provider tells you not to drink. You are pregnant, may be pregnant, or are planning to become pregnant. If you drink alcohol: Limit how much you have to: 0-1 drink a day for women. 0-2 drinks a day for men. Know how much alcohol is in your drink. In the [place] one drink equals one 12 oz bottle of beer (355 mL), one 5 oz glass of wine (148 mL), or one 1 oz glass of hard liquor (44 mL). Lifestyle  Work with your health care provider to maintain a healthy body weight or to lose weight. Ask what an ideal weight is for you. Get at least 30 minutes of exercise that causes your heart to beat faster (aerobic exercise) most days of the week. Activities may include walking, swimming, or biking. Include exercise to strengthen your muscles (resistance exercise), such as Pilates or lifting weights, as part of your weekly exercise routine. Try to do these types of exercises for 30 minutes at least 3 days a week. Do not use any products that contain nicotine or tobacco. These products include cigarettes, chewing tobacco, and vaping devices, such as e-cigarettes. If you need help quitting, ask your health care provider. Monitor your blood pressure at home as told by your health care provider. Keep all follow-up visits. This is important. Medicines Take over-the-counter and prescription  medicines only as told by your health care provider. Follow directions carefully. Blood pressure medicines must be taken as prescribed. Do not skip doses of blood pressure medicine. Doing this puts you at risk for problems and can make the medicine less effective. Ask your health care provider about side effects or reactions to medicines that you should watch for. Contact a health care provider if you: Think you are having a reaction to a medicine you are taking. Have headaches that keep coming back (recurring). Feel dizzy. Have swelling in your ankles. Have trouble with your vision. Get help right away if you: Develop a severe headache or confusion. Have unusual weakness or numbness. Feel faint. Have severe pain in your chest or abdomen. Vomit repeatedly. Have trouble breathing. These symptoms may be an emergency. Get help right away. Call 911. Do not wait to see if the symptoms will go away. Do not drive yourself to the hospital. Summary Hypertension is when the force of blood pumping through your arteries is too strong. If this condition is not controlled, it may put you at risk for serious complications. Your personal target blood pressure may vary depending on your medical conditions, your age, and other factors. For most people, a normal blood pressure is less than 120/80. Hypertension is treated with lifestyle changes, medicines, or a combination  of both. Lifestyle changes include losing weight, eating a healthy, low-sodium diet, exercising more, and limiting alcohol. This information is not intended to replace advice given to you by your health care provider. Make sure you discuss any questions you have with your health care provider. Document Revised: 01/02/2021 Document Reviewed: 01/02/2021 Elsevier Patient Education  [place].

## 2022-08-23 DIAGNOSIS — T1501XA Foreign body in cornea, right eye, initial encounter: Secondary | ICD-10-CM | POA: Diagnosis not present

## 2022-09-25 ENCOUNTER — Ambulatory Visit: Payer: Medicare Other | Admitting: *Deleted

## 2022-09-25 DIAGNOSIS — Z0000 Encounter for general adult medical examination without abnormal findings: Secondary | ICD-10-CM | POA: Diagnosis not present

## 2022-09-25 NOTE — Patient Instructions (Signed)
Anthony [name] , Thank you for taking time to come for your Medicare Wellness Visit. I appreciate your ongoing commitment to your health goals. Please review the following plan we discussed and let me know if I can assist you in the future.     This is a list of the screening recommended for you and due dates:  Health Maintenance  Topic Date Due   COVID-19 Vaccine (4 - 2023-24 season) 10/04/2022*   Flu Shot  10/10/2022   Medicare Annual Wellness Visit  09/25/2023   Colon Cancer Screening  08/04/2024   DTaP/Tdap/Td vaccine (3 - Td or Tdap) 11/03/2030   Pneumonia Vaccine  Completed   Hepatitis C Screening  Completed   Zoster (Shingles) Vaccine  Completed   HPV Vaccine  Aged Out  *Topic was postponed. The date shown is not the original due date.    Next appointment: Follow up in one year for your annual wellness visit.   Preventive Care 75 Years and Older, Male Preventive care refers to lifestyle choices and visits with your health care provider that can promote health and wellness. What does preventive care include? A yearly physical exam. This is also called an annual well check. Dental exams once or twice a year. Routine eye exams. Ask your health care provider how often you should have your eyes checked. Personal lifestyle choices, including: Daily care of your teeth and gums. Regular physical activity. Eating a healthy diet. Avoiding tobacco and drug use. Limiting alcohol use. Practicing safe sex. Taking low doses of aspirin every day. Taking vitamin and mineral supplements as recommended by your health care provider. What happens during an annual well check? The services and screenings done by your health care provider during your annual well check will depend on your age, overall health, lifestyle risk factors, and family history of disease. Counseling  Your health care provider may ask you questions about your: Alcohol use. Tobacco use. Drug use. Emotional  well-being. Home and relationship well-being. Sexual activity. Eating habits. History of falls. Memory and ability to understand (cognition). Work and work Astronomer. Screening  You may have the following tests or measurements: Height, weight, and BMI. Blood pressure. Lipid and cholesterol levels. These may be checked every 5 years, or more frequently if you are over 37 years old. Skin check. Lung cancer screening. You may have this screening every year starting at age 38 if you have a 30-pack-year history of smoking and currently smoke or have quit within the past 15 years. Fecal occult blood test (FOBT) of the stool. You may have this test every year starting at age 28. Flexible sigmoidoscopy or colonoscopy. You may have a sigmoidoscopy every 5 years or a colonoscopy every 10 years starting at age 75. Prostate cancer screening. Recommendations will vary depending on your family history and other risks. Hepatitis C blood test. Hepatitis B blood test. Sexually transmitted disease (STD) testing. Diabetes screening. This is done by checking your blood sugar (glucose) after you have not eaten for a while (fasting). You may have this done every 1-3 years. Abdominal aortic aneurysm (AAA) screening. You may need this if you are a current or former smoker. Osteoporosis. You may be screened starting at age 44 if you are at high risk. Talk with your health care provider about your test results, treatment options, and if necessary, the need for more tests. Vaccines  Your health care provider may recommend certain vaccines, such as: Influenza vaccine. This is recommended every year. Tetanus, diphtheria, and acellular  pertussis (Tdap, Td) vaccine. You may need a Td booster every 10 years. Zoster vaccine. You may need this after age 37. Pneumococcal 13-valent conjugate (PCV13) vaccine. One dose is recommended after age 35. Pneumococcal polysaccharide (PPSV23) vaccine. One dose is recommended after  age 61. Talk to your health care provider about which screenings and vaccines you need and how often you need them. This information is not intended to replace advice given to you by your health care provider. Make sure you discuss any questions you have with your health care provider. Document Released: 03/24/2015 Document Revised: 11/15/2015 Document Reviewed: 12/27/2014 Elsevier Interactive Patient Education  2017 [employer].  Fall Prevention in the Home Falls can cause injuries. They can happen to people of all ages. There are many things you can do to make your home safe and to help prevent falls. What can I do on the outside of my home? Regularly fix the edges of walkways and driveways and fix any cracks. Remove anything that might make you trip as you walk through a door, such as a raised step or threshold. Trim any bushes or trees on the path to your home. Use bright outdoor lighting. Clear any walking paths of anything that might make someone trip, such as rocks or tools. Regularly check to see if handrails are loose or broken. Make sure that both sides of any steps have handrails. Any raised decks and porches should have guardrails on the edges. Have any leaves, snow, or ice cleared regularly. Use sand or salt on walking paths during winter. Clean up any spills in your garage right away. This includes oil or grease spills. What can I do in the bathroom? Use night lights. Install grab bars by the toilet and in the tub and shower. Do not use towel bars as grab bars. Use non-skid mats or decals in the tub or shower. If you need to sit down in the shower, use a plastic, non-slip stool. Keep the floor dry. Clean up any water that spills on the floor as soon as it happens. Remove soap buildup in the tub or shower regularly. Attach bath mats securely with double-sided non-slip rug tape. Do not have throw rugs and other things on the floor that can make you trip. What can I do in the  bedroom? Use night lights. Make sure that you have a light by your bed that is easy to reach. Do not use any sheets or blankets that are too big for your bed. They should not hang down onto the floor. Have a firm chair that has side arms. You can use this for support while you get dressed. Do not have throw rugs and other things on the floor that can make you trip. What can I do in the kitchen? Clean up any spills right away. Avoid walking on wet floors. Keep items that you use a lot in easy-to-reach places. If you need to reach something above you, use a strong step stool that has a grab bar. Keep electrical cords out of the way. Do not use floor polish or wax that makes floors slippery. If you must use wax, use non-skid floor wax. Do not have throw rugs and other things on the floor that can make you trip. What can I do with my stairs? Do not leave any items on the stairs. Make sure that there are handrails on both sides of the stairs and use them. Fix handrails that are broken or loose. Make sure that handrails  are as long as the stairways. Check any carpeting to make sure that it is firmly attached to the stairs. Fix any carpet that is loose or worn. Avoid having throw rugs at the top or bottom of the stairs. If you do have throw rugs, attach them to the floor with carpet tape. Make sure that you have a light switch at the top of the stairs and the bottom of the stairs. If you do not have them, ask someone to add them for you. What else can I do to help prevent falls? Wear shoes that: Do not have high heels. Have rubber bottoms. Are comfortable and fit you well. Are closed at the toe. Do not wear sandals. If you use a stepladder: Make sure that it is fully opened. Do not climb a closed stepladder. Make sure that both sides of the stepladder are locked into place. Ask someone to hold it for you, if possible. Clearly mark and make sure that you can see: Any grab bars or  handrails. First and last steps. Where the edge of each step is. Use tools that help you move around (mobility aids) if they are needed. These include: Canes. Walkers. Scooters. Crutches. Turn on the lights when you go into a dark area. Replace any light bulbs as soon as they burn out. Set up your furniture so you have a clear path. Avoid moving your furniture around. If any of your floors are uneven, fix them. If there are any pets around you, be aware of where they are. Review your medicines with your doctor. Some medicines can make you feel dizzy. This can increase your chance of falling. Ask your doctor what other things that you can do to help prevent falls. This information is not intended to replace advice given to you by your health care provider. Make sure you discuss any questions you have with your health care provider. Document Released: 12/22/2008 Document Revised: 08/03/2015 Document Reviewed: 04/01/2014 Elsevier Interactive Patient Education  2017 [employer].

## 2022-09-25 NOTE — Progress Notes (Signed)
Subjective:   Anthony [name] is a 77 y.o. male who presents for Medicare Annual/Subsequent preventive examination.  Visit Complete: Virtual  I connected with  Anthony [name] on 09/25/22 by a audio enabled telemedicine application and verified that I am speaking with the correct person using two identifiers.  Patient Location: Home  Provider Location: Office/Clinic  I discussed the limitations of evaluation and management by telemedicine. The patient expressed understanding and agreed to proceed.  Patient Medicare AWV questionnaire was completed by the patient on 09/19/22; I have confirmed that all information answered by patient is correct and no changes since this date.  Review of Systems     Cardiac Risk Factors include: advanced age (>41men, >11 women);male gender;dyslipidemia;hypertension     Objective:    Per patient no change in vitals since last visit, unable to obtain new vitals due to telehealth visit      09/25/2022    9:01 AM 09/20/2021    8:24 AM 09/14/2020    9:05 AM 01/12/2020   11:34 AM 11/13/2019   10:28 AM 04/23/2019    9:07 AM 04/20/2018    1:00 PM  Advanced Directives  Does Patient Have a Medical Advance Directive? Yes Yes Yes Yes Yes Yes Yes  Type of Estate agent of [place];Living will Healthcare Power of [place];Living will Healthcare Power of [place];Living will Living will;Healthcare Power of [employer] Power of [place];Living will Living will;Healthcare Power of [employer] Power of [place];Living will  Does patient want to make changes to medical advance directive? No - Patient declined No - Patient declined   No - Patient declined No - Patient declined No - Patient declined  Copy of Healthcare Power of Attorney in Chart? Yes - validated most recent copy scanned in chart (See row information) Yes - validated most recent copy scanned in chart (See row information) Yes - validated most recent copy scanned in chart  (See row information) No - copy requested No - copy requested Yes - validated most recent copy scanned in chart (See row information) Yes - validated most recent copy scanned in chart (See row information)  Would patient like information on creating a medical advance directive?     No - Patient declined      Current Medications (verified) Outpatient Encounter Medications as of 09/25/2022  Medication Sig   amLODipine (NORVASC) 10 MG tablet TAKE 1 TABLET BY MOUTH IN THE  MORNING   ascorbic acid (VITAMIN C) 500 MG tablet Take 500 mg by mouth daily.   aspirin 81 MG tablet Take 81 mg by mouth every morning.   gabapentin (NEURONTIN) 300 MG capsule Take 300 mg by mouth 2 (two) times daily. One capsule at lunch and two capsule at bedtime   hydrochlorothiazide (MICROZIDE) 12.5 MG capsule TAKE 1 CAPSULE BY MOUTH DAILY   losartan (COZAAR) 100 MG tablet TAKE 1 TABLET BY MOUTH IN THE  MORNING   metoprolol tartrate (LOPRESSOR) 25 MG tablet TAKE ONE-HALF TABLET BY MOUTH  TWICE DAILY   Multiple Vitamins-Minerals (ICAPS PO) Take by mouth every morning.    potassium citrate (UROCIT-K) 10 MEQ (1080 MG) SR tablet Take 10 mEq by mouth in the morning and at bedtime.    rosuvastatin (CRESTOR) 40 MG tablet TAKE 1 TABLET BY MOUTH DAILY   No facility-administered encounter medications on file as of 09/25/2022.    Allergies (verified) Patient has no known allergies.   History: Past Medical History:  Diagnosis Date   Allergic rhinitis  BPH (benign prostatic hyperplasia)    CAD (coronary artery disease) 05/26/2009 :  primary cardiolgoist-  dr Anthony [name]   hx NSTEMI cardiac catherization  revealed-left main normal; LAD proximal calcification,  long proximal 30% stenosis,  Circumflex  AV groove   proximal  60% stenosis at mid obtuse marginal,  first large mid obtuse marginal  ostial 70% stenosis,  prox. to mid RCA  99% stenosis w/ intervention PCI and DES   Carotid stenosis, asymptomatic, bilateral    per last duplex  03-31-2015  bilateral ICA 1-39%   Cataract    removed   External hemorrhoids    large external   GERD (gastroesophageal reflux disease)    Glaucoma    History of adenomatous polyp of colon    History of kidney stones    History of non-ST elevation myocardial infarction (NSTEMI) 05/26/2009   Hyperlipidemia    Hypertension    8-10 years   Left ureteral stone    Macular degeneration    Myocardial infarction [facility])    Nephrolithiasis    right side non-obstructive per CT 01-03-2017   S/P drug eluting coronary stent placement 05/26/2009   DESx1  to pRCA   Wears glasses    Past Surgical History:  Procedure Laterality Date   CARDIOVASCULAR STRESS TEST  12/26/2011   dr Anthony [name]   normal nuclear study w/ no ischemia/  normal LV function and wall motion , ef 71%   CATARACT EXTRACTION W/ INTRAOCULAR LENS  IMPLANT, BILATERAL  2010   COLONOSCOPY  last one 06-10-2016   CORONARY ANGIOPLASTY WITH STENT PLACEMENT  05-26-2009  dr hochrein/ dr Smitty Cords Anthony [name]   Severe RCA stenosis and moderate stenosis in the left system, ef 50% with inferior hypokinsis:  PCI and DES x1 to RCA (Prom[facility])   CYSTOSCOPY W/ RETROGRADES Bilateral 09/29/2013   Procedure: CYSTOSCOPY WITH RETROGRADE PYELOGRAM;  Surgeon: Anthony Ache, MD;  Location: [facility];  Service: Urology;  Laterality: Bilateral;   CYSTOSCOPY WITH RETROGRADE PYELOGRAM, URETEROSCOPY AND STENT PLACEMENT N/A 06/14/2015   Procedure: CYSTOSCOPY WITH RETROGRADE PYELOGRAM, cysto litholapexy ;  Surgeon: Anthony Ache, MD;  [facility];  Service: Urology;  Laterality: N/A;   CYSTOSCOPY/URETEROSCOPY/HOLMIUM LASER/STENT PLACEMENT Left 01/07/2017   Procedure: CYSTOSCOPY/RETROGRADE/URETEROSCOPY/HOLMIUM LASER/STENT PLACEMENT, FULGERATION OF PROSTATIC URETHRA;  Surgeon: Anthony Paci, MD;  Location: [facility];  Service: Urology;  Laterality: Left;   CYSTOSCOPY/URETEROSCOPY/HOLMIUM LASER/STENT PLACEMENT Right 01/12/2020    Procedure: CYSTOSCOPY/ RIGHT RETROGRADE/ RIGHT URETEROSCOPY/ RIGHT HOLMIUM LASER/ RIGHT STENT PLACEMENT;  Surgeon: Anthony Paci, MD;  Location: [facility];  Service: Urology;  Laterality: Right;  ONLY NEEDS 60 MIN   EYE SURGERY     HOLMIUM LASER APPLICATION N/A 06/14/2015   Procedure: HOLMIUM LASER APPLICATION bladder stone;  Surgeon: Anthony Ache, MD;  Location: [facility];  Service: Urology;  Laterality: N/A;   HOLMIUM LASER APPLICATION Left 01/07/2017   Procedure: HOLMIUM LASER APPLICATION;  Surgeon: Anthony Paci, MD;  Location: [facility];  Service: Urology;  Laterality: Left;   NEPHROLITHOTOMY Left 09/29/2013   Procedure: NEPHROLITHOTOMY PERCUTANEOUS WITH ACCESS;  Surgeon: Anthony Ache, MD;  Location: [facility];  Service: Urology;  Laterality: Left;   URETEROSCOPY N/A 09/29/2013   Procedure: URETEROSCOPY;  Surgeon: Anthony Ache, MD;  Location: [facility];  Service: Urology;  Laterality: N/A;   Family History  Problem Relation Age of Onset   Coronary artery disease Father        in his 75's  Hyperlipidemia Father    Hypertension Father    Hearing loss Father    Heart disease Mother        deceased of heart disease   Hypertension Mother    Hyperlipidemia Mother    Multiple sclerosis Son    Heart disease Son    Heart disease Maternal Grandmother    Heart disease Maternal Grandfather    Heart disease Paternal Grandmother    Heart disease Paternal Grandfather    GER disease Sister    Heart disease Paternal Aunt    Other Neg Hx        no lung cancer, colon cancer, or prostate   Colon cancer Neg Hx    Stomach cancer Neg Hx    Esophageal cancer Neg Hx    Social History   Socioeconomic History   Marital status: Married    Spouse name: Not on file   Number of children: Not on file   Years of education: Not on file   Highest education level: Not on file  Occupational History   Not on file  Tobacco Use    Smoking status: Former    Current packs/day: 0.00    Types: Cigarettes    Start date: 03/11/1962    Quit date: 03/12/2007    Years since quitting: 15.5   Smokeless tobacco: Never  Substance and Sexual Activity   Alcohol use: No   Drug use: No   Sexual activity: Yes    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  He had     a 40 pack-year smoking history but has discontinued.     prev occuptation -  [employer]   Alcohol use-no   Smoking Status:  quit   Packs/Day:  0.5   Caffeine use/day:  2 beverages daily   Does Patient Exercise:  yes            Social Determinants of Health   Financial Resource Strain: Low Risk  (09/19/2022)   Overall Financial Resource Strain (CARDIA)    Difficulty of Paying Living Expenses: Not hard at all  Food Insecurity: No Food Insecurity (09/19/2022)   Hunger Vital Sign    Worried About Running Out of Food in the Last Year: Never true    Ran Out of Food in the Last Year: Never true  Transportation Needs: No Transportation Needs (09/19/2022)   PRAPARE - Administrator, Civil Service (Medical): No    Lack of Transportation (Non-Medical): No  Physical Activity: Sufficiently Active (09/19/2022)   Exercise Vital Sign    Days of Exercise per Week: 7 days    Minutes of Exercise per Session: 110 min  Stress: No Stress Concern Present (09/19/2022)   [employer] of Occupational Health - Occupational Stress Questionnaire    Feeling of Stress : Not at all  Social Connections: Unknown (09/19/2022)   Social Connection and Isolation Panel [NHANES]    Frequency of Communication with Friends and Family: Three times a week    Frequency of Social Gatherings with Friends and Family: More than three times a week    Attends Religious Services:  Not on file    Active Member of Clubs or Organizations: No    Attends  Banker Meetings: Never    Marital Status: Married    Tobacco Counseling Counseling given: Not Answered   Clinical Intake:  Pre-visit preparation completed: Yes  Pain : No/denies pain  Nutritional Risks: None Diabetes: No  How often do you need to have someone help you when you read instructions, pamphlets, or other written materials from your doctor or pharmacy?: 1 - Never  Interpreter Needed?: No  Information entered by :: Donne Anon, CMA   Activities of Daily Living    09/19/2022    2:46 PM  In your present state of health, do you have any difficulty performing the following activities:  Hearing? 0  Vision? 0  Difficulty concentrating or making decisions? 0  Walking or climbing stairs? 0  Dressing or bathing? 0  Doing errands, shopping? 0  Preparing Food and eating ? N  Using the Toilet? N  In the past six months, have you accidently leaked urine? N  Do you have problems with loss of bowel control? N  Managing your Medications? N  Managing your Finances? N  Housekeeping or managing your Housekeeping? N    Patient Care Team: Bradd Canary, MD as PCP - General (Family Medicine) Sharyn Lull, Elvin So, MD as Referring Physician (Dermatology) Berneice Heinrich Delbert Phenix., MD as Consulting Physician (Urology) Marvis Repress, MD as Referring Physician (Ophthalmology) Lewayne Bunting, MD as Consulting Physician (Cardiology) Anthony Paci, MD as Consulting Physician (Urology) Pyrtle, Carie Caddy, MD as Consulting Physician (Gastroenterology)  Indicate any recent Medical Services you may have received from other than [facility] providers in the past year (date may be approximate).     Assessment:   This is a routine wellness examination for [place].  Hearing/Vision screen No results found.  Dietary issues and exercise activities discussed:     Goals Addressed   None     Depression Screen    09/25/2022    9:04 AM 05/16/2022   10:43 AM 09/20/2021    8:24 AM 05/29/2021    8:54 AM 09/14/2020    9:07 AM 05/04/2020    8:32 AM 04/23/2019    9:09 AM  PHQ 2/9 Scores  PHQ - 2 Score 0 0 0 0 0 0 0  PHQ- 9 Score  0    0     Fall Risk    09/19/2022    2:46 PM 05/16/2022   10:43 AM 09/20/2021    8:24 AM 05/29/2021    8:53 AM 09/14/2020    9:06 AM  Fall Risk   Falls in the past year? 0 0 0 0 0  Number falls in past yr: 0 0 0 0 0  Injury with Fall? 0 0 0 0 0  Risk for fall due to : No Fall Risks  No Fall Risks No Fall Risks   Follow up Falls evaluation completed Falls evaluation completed Falls evaluation completed Falls evaluation completed Falls prevention discussed    MEDICARE RISK AT HOME:  Medicare Risk at Home - 09/25/22 0843     Any stairs in or around the home? No    If so, are there any without handrails? No    Home free of loose throw rugs in walkways, pet beds, electrical cords, etc? Yes    Adequate lighting in your home to reduce risk of falls? Yes    Life alert? No    Use of a cane, walker or w/c? No    Grab bars in the bathroom? Yes    Shower  chair or bench in shower? No    Elevated toilet seat or a handicapped toilet? No             TIMED UP AND GO:  Was the test performed?  No    Cognitive Function:    04/20/2018    1:13 PM 04/18/2017    8:07 AM  MMSE - Mini Mental State Exam  Orientation to time 5 5  Orientation to Place 5 5  Registration 3 3  Attention/ Calculation 5 5  Recall 3 3  Language- name 2 objects 2 2  Language- repeat 1 1  Language- follow 3 step command 3 3  Language- read & follow direction 1 1  Write a sentence 1 1  Copy design 1 1  Total score 30 30        09/25/2022    9:04 AM 09/20/2021    8:28 AM  6CIT Screen  What Year? 0 points 0 points  What month? 0 points 0 points  What time? 0 points 0 points  Count back from 20 0 points 0 points  Months in reverse 0 points 0 points  Repeat phrase 0 points 0  points  Total Score 0 points 0 points    Immunizations Immunization History  Administered Date(s) Administered   Fluad Quad(high Dose 65+) 11/06/2018, 12/02/2019, 12/01/2020, 12/14/2021   Influenza Split 12/07/2010, 11/20/2011, 12/05/2011   Influenza Whole 01/06/2009, 11/20/2009   Influenza, High Dose Seasonal PF 12/06/2015, 12/17/2016, 12/01/2017   Influenza,inj,Quad PF,6+ Mos 12/09/2012, 12/15/2013, 11/30/2014   PFIZER(Purple Top)SARS-COV-2 Vaccination 04/18/2019, 05/12/2019, 02/11/2020   Pneumococcal Conjugate-13 04/14/2015   Pneumococcal Polysaccharide-23 05/11/2009, 12/17/2016   Td 10/27/2007   Tdap 11/02/2020   Zoster Recombinant(Shingrix) 12/24/2017, 03/09/2018   Zoster, Live 05/30/2008    TDAP status: Up to date  Flu Vaccine status: Up to date  Pneumococcal vaccine status: Up to date  Covid-19 vaccine status: Information provided on how to obtain vaccines.   Qualifies for Shingles Vaccine? Yes   Zostavax completed Yes   Shingrix Completed?: Yes  Screening Tests Health Maintenance  Topic Date Due   Medicare Annual Wellness (AWV)  09/21/2022   COVID-19 Vaccine (4 - 2023-24 season) 10/04/2022 (Originally 11/09/2021)   INFLUENZA VACCINE  10/10/2022   Colonoscopy  08/04/2024   DTaP/Tdap/Td (3 - Td or Tdap) 11/03/2030   Pneumonia Vaccine 17+ Years old  Completed   Hepatitis C Screening  Completed   Zoster Vaccines- Shingrix  Completed   HPV VACCINES  Aged Out    Health Maintenance  Health Maintenance Due  Topic Date Due   Medicare Annual Wellness (AWV)  09/21/2022    Colorectal cancer screening: Type of screening: Colonoscopy. Completed 08/05/19. Repeat every 5 years  Lung Cancer Screening: (Low Dose CT Chest recommended if Age 49-80 years, 20 pack-year currently smoking OR have quit w/in 15years.) does not qualify.   Additional Screening:  Hepatitis C Screening: does qualify; Completed 04/14/15  Vision Screening: Recommended annual ophthalmology exams for  early detection of glaucoma and other disorders of the eye. Is the patient up to date with their annual eye exam?  Yes  Who is the provider or what is the name of the office in which the patient attends annual eye exams? Can't remember name at this time If pt is not established with a provider, would they like to be referred to a provider to establish care? No .   Dental Screening: Recommended annual dental exams for proper oral hygiene  Diabetic Foot Exam: N/a  Community Resource Referral / Chronic Care Management: CRR required this visit?  No   CCM required this visit?  No     Plan:     I have personally reviewed and noted the following in the patient's chart:   Medical and social history Use of alcohol, tobacco or illicit drugs  Current medications and supplements including opioid prescriptions. Patient is not currently taking opioid prescriptions. Functional ability and status Nutritional status Physical activity Advanced directives List of other physicians Hospitalizations, surgeries, and ER visits in previous 12 months Vitals Screenings to include cognitive, depression, and falls Referrals and appointments  In addition, I have reviewed and discussed with patient certain preventive protocols, quality metrics, and best practice recommendations. A written personalized care plan for preventive services as well as general preventive health recommendations were provided to patient.     Donne Anon, CMA   09/25/2022   After Visit Summary: (MyChart) Due to this being a telephonic visit, the after visit summary with patients personalized plan was offered to patient via MyChart   Nurse Notes: None

## 2022-10-08 ENCOUNTER — Encounter: Payer: Self-pay | Admitting: Family Medicine

## 2022-10-08 ENCOUNTER — Ambulatory Visit (INDEPENDENT_AMBULATORY_CARE_PROVIDER_SITE_OTHER): Payer: Medicare Other | Admitting: Family Medicine

## 2022-10-08 ENCOUNTER — Other Ambulatory Visit (HOSPITAL_BASED_OUTPATIENT_CLINIC_OR_DEPARTMENT_OTHER): Payer: Self-pay

## 2022-10-08 VITALS — BP 114/49 | HR 77 | Temp 98.4°F | Ht 65.0 in | Wt 172.0 lb

## 2022-10-08 DIAGNOSIS — H6123 Impacted cerumen, bilateral: Secondary | ICD-10-CM | POA: Diagnosis not present

## 2022-10-08 DIAGNOSIS — J029 Acute pharyngitis, unspecified: Secondary | ICD-10-CM | POA: Diagnosis not present

## 2022-10-08 MED ORDER — PHENOL 1.4 % MT LIQD
1.0000 | OROMUCOSAL | 0 refills | Status: DC | PRN
Start: 2022-10-08 — End: 2022-10-23
  Filled 2022-10-08: qty 177, 30d supply, fill #0

## 2022-10-08 MED ORDER — FLUTICASONE PROPIONATE 50 MCG/ACT NA SUSP
2.0000 | Freq: Every day | NASAL | 6 refills | Status: DC
Start: 2022-10-08 — End: 2022-11-21
  Filled 2022-10-08: qty 16, 30d supply, fill #0

## 2022-10-08 NOTE — Progress Notes (Signed)
Acute Office Visit  Subjective:     Patient ID: Anthony [name], male    DOB: [birth year], 77 y.o.   MRN: [id number]  Chief Complaint  Patient presents with   Sore Throat     Patient is in today for sore throat.   Discussed the use of AI scribe software for clinical note transcription with the patient, who gave verbal consent to proceed.  History of Present Illness   The patient, with a history of environmental allergies, presents with a sore throat that began after weed eating without a mask. The sore throat was described as a stabbing pain lasting approximately 10 minutes before easing up. The patient also reported a cough, but this has since resolved. He denied any sneezing, runny nose, or significant postnasal drainage. The patient also reported a 'fuzzy' feeling in the head, suggestive of pressure, but denied any sinus pressure around the nose or eyes. There were no associated symptoms of chest pain, trouble breathing, wheezing, or fever. The patient has been self-monitoring his temperature, which has remained within normal limits. Over-the-counter Tylenol was taken for the sore throat but was reported to be ineffective. The patient also reported a history of ear wax build-up and currently has some wax in both ears. He has been using peroxide for this issue.           ROS All review of systems negative except what is listed in the HPI      Objective:    BP (!) 114/49   Pulse 77   Temp 98.4 F (36.9 C) (Oral)   Ht 5\' 5"  (1.651 m)   Wt 172 lb (78 kg)   SpO2 97%   BMI 28.62 kg/m    Physical Exam Vitals reviewed.  Constitutional:      General: He is not in acute distress.    Appearance: Normal appearance. He is well-developed. He is not ill-appearing.  HENT:     Head: Normocephalic and atraumatic.     Right Ear: There is impacted cerumen.     Left Ear: There is impacted cerumen.     Mouth/Throat:     Pharynx: Posterior oropharyngeal erythema present. No  oropharyngeal exudate.     Tonsils: No tonsillar exudate or tonsillar abscesses. 0 on the right. 0 on the left.  Eyes:     Conjunctiva/sclera: Conjunctivae normal.  Cardiovascular:     Rate and Rhythm: Normal rate and regular rhythm.     Pulses: Normal pulses.     Heart sounds: Normal heart sounds.  Pulmonary:     Effort: Pulmonary effort is normal.     Breath sounds: Normal breath sounds.  Musculoskeletal:     Cervical back: No tenderness.  Lymphadenopathy:     Cervical: No cervical adenopathy.  Skin:    General: Skin is warm and dry.  Neurological:     Mental Status: He is alert and oriented to person, place, and time.  Psychiatric:        Mood and Affect: Mood normal.        Behavior: Behavior normal.        Thought Content: Thought content normal.        Judgment: Judgment normal.     No results found for any visits on 10/08/22.      Assessment & Plan:   Problem List Items Addressed This Visit   None Visit Diagnoses     Acute viral pharyngitis    -  Primary For your sore throat -  glad to hear today is a little bit better, likely viral cause. Continue supportive measures including rest, hydration, humidifier use, steam showers, warm liquids with lemon and honey, and over-the-counter cough, cold, and analgesics as needed. Sending in Chloraseptic spray for relief. Also adding Flonase in case there is an allergy component as well.     Relevant Medications   phenol (CHLORASEPTIC) 1.4 % LIQD   fluticasone (FLONASE) 50 MCG/ACT nasal spray   Bilateral impacted cerumen     Ears are stopped up - recommend Debrox drops for a few days, then if you need [place] to flush them out, schedule an appointment.        Meds ordered this encounter  Medications   phenol (CHLORASEPTIC) 1.4 % LIQD    Sig: Use as directed 1 spray in the mouth or throat as needed for throat irritation / pain.    Dispense:  177 mL    Refill:  0    Order Specific Question:   Supervising Provider    Answer:    Danise Edge A [[id number]]   fluticasone (FLONASE) 50 MCG/ACT nasal spray    Sig: Place 2 sprays into both nostrils daily.    Dispense:  16 g    Refill:  6    Order Specific Question:   Supervising Provider    Answer:   Danise Edge A [[id number]]    Return if symptoms worsen or fail to improve.  Clayborne Dana, NP

## 2022-10-08 NOTE — Patient Instructions (Addendum)
Ears are stopped up - recommend Debrox drops for a few days, then if you need [place] to flush them out, schedule an appointment.   For your sore throat - glad to hear today is a little bit better, likely viral cause. Continue supportive measures including rest, hydration, humidifier use, steam showers, warm liquids with lemon and honey, and over-the-counter cough, cold, and analgesics as needed. Sending in Chloraseptic spray for relief. Also adding Flonase in case there is an allergy component as well.   Please contact office for follow-up if symptoms do not improve or worsen. Seek emergency care if symptoms become severe.

## 2022-10-10 ENCOUNTER — Other Ambulatory Visit: Payer: Self-pay | Admitting: Cardiology

## 2022-10-10 DIAGNOSIS — I10 Essential (primary) hypertension: Secondary | ICD-10-CM

## 2022-10-10 DIAGNOSIS — I2510 Atherosclerotic heart disease of native coronary artery without angina pectoris: Secondary | ICD-10-CM

## 2022-10-12 ENCOUNTER — Ambulatory Visit
Admission: EM | Admit: 2022-10-12 | Discharge: 2022-10-12 | Disposition: A | Discharge status: Home / Self Care | Payer: Medicare Other | Source: Home / Self Care

## 2022-10-12 DIAGNOSIS — J209 Acute bronchitis, unspecified: Secondary | ICD-10-CM

## 2022-10-12 MED ORDER — PREDNISONE 10 MG PO TABS: 20.0000 mg | ORAL_TABLET | Freq: Every day | ORAL | 0 refills | Status: DC

## 2022-10-12 MED ORDER — AZITHROMYCIN 250 MG PO TABS: 250.0000 mg | ORAL_TABLET | Freq: Every day | ORAL | 0 refills | Status: DC

## 2022-10-12 MED ORDER — BENZONATATE 100 MG PO CAPS: 100.0000 mg | ORAL_CAPSULE | Freq: Three times a day (TID) | ORAL | 0 refills | Status: DC

## 2022-10-12 NOTE — ED Triage Notes (Signed)
Pt reports cough, head aching  x 8-10 days. PCP prescribed spray for throat last week and gave relief.

## 2022-10-12 NOTE — ED Provider Notes (Signed)
[facility] CARE CENTER   [id number] 10/12/22 Arrival Time: 0807   Chief Complaint  Patient presents with   Cough     SUBJECTIVE: History from: patient.  Anthony [name] is a 77 y.o. male who presents to the urgent care with a complaint of cough and headache for the past 8 to 10 days.  Report wife with the same symptoms.  Denies recent travel.  Has tried oral throat spray with relief.  Denies aggravating factors.  Denies previous symptoms in the past.   Denies fever, chills, fatigue, sinus pain, rhinorrhea,  SOB, wheezing, chest pain, nausea, changes in bowel or bladder habits.     ROS: As per HPI.  All other pertinent ROS negative.      Past Medical History:  Diagnosis Date   Allergic rhinitis    BPH (benign prostatic hyperplasia)    CAD (coronary artery disease) 05/26/2009 :  primary cardiolgoist-  dr Jens Som   hx NSTEMI cardiac catherization  revealed-left main normal; LAD proximal calcification,  long proximal 30% stenosis,  Circumflex  AV groove   proximal  60% stenosis at mid obtuse marginal,  first large mid obtuse marginal  ostial 70% stenosis,  prox. to mid RCA  99% stenosis w/ intervention PCI and DES   Carotid stenosis, asymptomatic, bilateral    per last duplex 03-31-2015  bilateral ICA 1-39%   Cataract    removed   External hemorrhoids    large external   GERD (gastroesophageal reflux disease)    Glaucoma    History of adenomatous polyp of colon    History of kidney stones    History of non-ST elevation myocardial infarction (NSTEMI) 05/26/2009   Hyperlipidemia    Hypertension    8-10 years   Left ureteral stone    Macular degeneration    Myocardial infarction [facility])    Nephrolithiasis    right side non-obstructive per CT 01-03-2017   S/P drug eluting coronary stent placement 05/26/2009   DESx1  to pRCA   Wears glasses    Past Surgical History:  Procedure Laterality Date   CARDIOVASCULAR STRESS TEST  12/26/2011   dr Jens Som   normal nuclear study w/ no  ischemia/  normal LV function and wall motion , ef 71%   CATARACT EXTRACTION W/ INTRAOCULAR LENS  IMPLANT, BILATERAL  2010   COLONOSCOPY  last one 06-10-2016   CORONARY ANGIOPLASTY WITH STENT PLACEMENT  05-26-2009  dr hochrein/ dr Smitty Cords brodie   Severe RCA stenosis and moderate stenosis in the left system, ef 50% with inferior hypokinsis:  PCI and DES x1 to RCA (Prom[facility])   CYSTOSCOPY W/ RETROGRADES Bilateral 09/29/2013   Procedure: CYSTOSCOPY WITH RETROGRADE PYELOGRAM;  Surgeon: Sebastian Ache, MD;  Location: [facility];  Service: Urology;  Laterality: Bilateral;   CYSTOSCOPY WITH RETROGRADE PYELOGRAM, URETEROSCOPY AND STENT PLACEMENT N/A 06/14/2015   Procedure: CYSTOSCOPY WITH RETROGRADE PYELOGRAM, cysto litholapexy ;  Surgeon: Sebastian Ache, MD;  Location: [facility];  Service: Urology;  Laterality: N/A;   CYSTOSCOPY/URETEROSCOPY/HOLMIUM LASER/STENT PLACEMENT Left 01/07/2017   Procedure: CYSTOSCOPY/RETROGRADE/URETEROSCOPY/HOLMIUM LASER/STENT PLACEMENT, FULGERATION OF PROSTATIC URETHRA;  Surgeon: Rene Paci, MD;  Location: [facility];  Service: Urology;  Laterality: Left;   CYSTOSCOPY/URETEROSCOPY/HOLMIUM LASER/STENT PLACEMENT Right 01/12/2020   Procedure: CYSTOSCOPY/ RIGHT RETROGRADE/ RIGHT URETEROSCOPY/ RIGHT HOLMIUM LASER/ RIGHT STENT PLACEMENT;  Surgeon: Rene Paci, MD;  Location: [facility];  Service: Urology;  Laterality: Right;  ONLY NEEDS 60 MIN   EYE SURGERY  HOLMIUM LASER APPLICATION N/A 06/14/2015   Procedure: HOLMIUM LASER APPLICATION bladder stone;  Surgeon: Sebastian Ache, MD;  Location: [facility];  Service: Urology;  Laterality: N/A;   HOLMIUM LASER APPLICATION Left 01/07/2017   Procedure: HOLMIUM LASER APPLICATION;  Surgeon: Rene Paci, MD;  Location: [facility];  Service: Urology;  Laterality: Left;   NEPHROLITHOTOMY Left 09/29/2013   Procedure:  NEPHROLITHOTOMY PERCUTANEOUS WITH ACCESS;  Surgeon: Sebastian Ache, MD;  Location: [facility];  Service: Urology;  Laterality: Left;   URETEROSCOPY N/A 09/29/2013   Procedure: URETEROSCOPY;  Surgeon: Sebastian Ache, MD;  Location: [facility];  Service: Urology;  Laterality: N/A;   No Known Allergies No current facility-administered medications on file prior to encounter.   Current Outpatient Medications on File Prior to Encounter  Medication Sig Dispense Refill   amLODipine (NORVASC) 10 MG tablet TAKE 1 TABLET BY MOUTH IN THE  MORNING 90 tablet 3   ascorbic acid (VITAMIN C) 500 MG tablet Take 500 mg by mouth daily.     aspirin 81 MG tablet Take 81 mg by mouth every morning.     fluticasone (FLONASE) 50 MCG/ACT nasal spray Place 2 sprays into both nostrils daily. 16 g 6   gabapentin (NEURONTIN) 300 MG capsule Take 300 mg by mouth 2 (two) times daily. One capsule at lunch and two capsule at bedtime     hydrochlorothiazide (MICROZIDE) 12.5 MG capsule TAKE 1 CAPSULE BY MOUTH DAILY 100 capsule 1   losartan (COZAAR) 100 MG tablet TAKE 1 TABLET BY MOUTH IN THE  MORNING 90 tablet 3   metoprolol tartrate (LOPRESSOR) 25 MG tablet TAKE ONE-HALF TABLET BY MOUTH  TWICE DAILY 200 tablet 1   Multiple Vitamins-Minerals (ICAPS PO) Take by mouth every morning.      phenol (CHLORASEPTIC) 1.4 % LIQD Use as directed 1 spray in the mouth or throat as needed for throat irritation / pain. 177 mL 0   potassium citrate (UROCIT-K) 10 MEQ (1080 MG) SR tablet Take 10 mEq by mouth in the morning and at bedtime.   3   rosuvastatin (CRESTOR) 40 MG tablet TAKE 1 TABLET BY MOUTH DAILY 100 tablet 1   Social History   Socioeconomic History   Marital status: Married    Spouse name: Not on file   Number of children: Not on file   Years of education: Not on file   Highest education level: Associate degree: academic program  Occupational History   Not on file  Tobacco Use   Smoking status: Former    Current packs/day: 0.00     Types: Cigarettes    Start date: 03/11/1962    Quit date: 03/12/2007    Years since quitting: 15.5   Smokeless tobacco: Never  Substance and Sexual Activity   Alcohol use: No   Drug use: No   Sexual activity: Yes    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  He had     a 40 pack-year smoking history but has discontinued.     prev occuptation -  [employer]   Alcohol use-no   Smoking Status:  quit   Packs/Day:  0.5   Caffeine use/day:  2 beverages daily   Does  Patient Exercise:  yes            Social Determinants of Health   Financial Resource Strain: Low Risk  (10/07/2022)   Overall Financial Resource Strain (CARDIA)    Difficulty of Paying Living Expenses: Not hard at all  Food Insecurity: No Food Insecurity (10/07/2022)   Hunger Vital Sign    Worried About Running Out of Food in the Last Year: Never true    Ran Out of Food in the Last Year: Never true  Transportation Needs: No Transportation Needs (10/07/2022)   PRAPARE - Administrator, Civil Service (Medical): No    Lack of Transportation (Non-Medical): No  Physical Activity: Sufficiently Active (10/07/2022)   Exercise Vital Sign    Days of Exercise per Week: 7 days    Minutes of Exercise per Session: 120 min  Stress: No Stress Concern Present (10/07/2022)   [employer] of Occupational Health - Occupational Stress Questionnaire    Feeling of Stress : Not at all  Social Connections: Moderately Integrated (10/07/2022)   Social Connection and Isolation Panel [NHANES]    Frequency of Communication with Friends and Family: More than three times a week    Frequency of Social Gatherings with Friends and Family: Three times a week    Attends Religious Services: 1 to 4 times per year    Active Member of  Clubs or Organizations: No    Attends Banker Meetings: Never    Marital Status: Married  Catering manager Violence: Not At Risk (09/25/2022)   Humiliation, Afraid, Rape, and Kick questionnaire    Fear of Current or Ex-Partner: No    Emotionally Abused: No    Physically Abused: No    Sexually Abused: No   Family History  Problem Relation Age of Onset   Coronary artery disease Father        in his 65's   Hyperlipidemia Father    Hypertension Father    Hearing loss Father    Heart disease Mother        deceased of heart disease   Hypertension Mother    Hyperlipidemia Mother    Multiple sclerosis Son    Heart disease Son    Heart disease Maternal Grandmother    Heart disease Maternal Grandfather    Heart disease Paternal Grandmother    Heart disease Paternal Grandfather    GER disease Sister    Heart disease Paternal Aunt    Other Neg Hx        no lung cancer, colon cancer, or prostate   Colon cancer Neg Hx    Stomach cancer Neg Hx    Esophageal cancer Neg Hx     OBJECTIVE:  Vitals:   10/12/22 0845  BP: (!) 149/66  Pulse: 83  Resp: 18  Temp: 99 F (37.2 C)  TempSrc: Oral  SpO2: 94%     Physical Exam Vitals and nursing note reviewed.  Constitutional:      General: He is not in acute distress.    Appearance: Normal appearance. He is normal weight. He is not ill-appearing, toxic-appearing or diaphoretic.  HENT:     Right Ear: There is impacted cerumen.     Left Ear: There is impacted cerumen.  Cardiovascular:     Rate and Rhythm: Normal rate and regular rhythm.     Pulses: Normal pulses.     Heart sounds: Normal heart sounds. No murmur heard.    No friction rub. No gallop.  Pulmonary:  Effort: Pulmonary effort is normal. No respiratory distress.     Breath sounds: Normal breath sounds. No stridor. No wheezing, rhonchi or rales.  Chest:     Chest wall: No tenderness.  Neurological:     Mental Status: He is alert and oriented to person,  place, and time.      LABS:  No results found for this or any previous visit (from the past 24 hour(s)).   ASSESSMENT & PLAN:  1. Acute bronchitis, unspecified organism     Meds ordered this encounter  Medications   azithromycin (ZITHROMAX) 250 MG tablet    Sig: Take 1 tablet (250 mg total) by mouth daily. Take first 2 tablets together, then 1 every day until finished.    Dispense:  6 tablet    Refill:  0   predniSONE (DELTASONE) 10 MG tablet    Sig: Take 2 tablets (20 mg total) by mouth daily.    Dispense:  15 tablet    Refill:  0   benzonatate (TESSALON PERLES) 100 MG capsule    Sig: Take 1 capsule (100 mg total) by mouth 3 (three) times daily.    Dispense:  30 capsule    Refill:  0    Discharge instructions  Get plenty of rest and push fluids Tessalon Perles prescribed for cough Prednisone was prescribed Azithromycin was prescribed/take as directed Use medications daily for symptom relief Use OTC medications like ibuprofen or tylenol as needed fever or pain Call or go to the ED if you have any new or worsening symptoms such as fever, worsening cough, shortness of breath, chest tightness, chest pain, turning blue, changes in mental status, etc...   Reviewed expectations re: course of current medical issues. Questions answered. Outlined signs and symptoms indicating need for more acute intervention. Patient verbalized understanding. After Visit Summary given.          Durward Parcel, FNP 10/12/22 [phone]

## 2022-10-12 NOTE — Discharge Instructions (Addendum)
Get plenty of rest and push fluids Tessalon Perles prescribed for cough Prednisone was prescribed  Azithromycin was prescribed/take as directed Use medications daily for symptom relief Use OTC medications like ibuprofen or tylenol as needed fever or pain Call or go to the ED if you have any new or worsening symptoms such as fever, worsening cough, shortness of breath, chest tightness, chest pain, turning blue, changes in mental status, etc..Marland Kitchen

## 2022-10-23 ENCOUNTER — Ambulatory Visit (INDEPENDENT_AMBULATORY_CARE_PROVIDER_SITE_OTHER): Payer: Medicare Other | Admitting: Family Medicine

## 2022-10-23 ENCOUNTER — Encounter: Payer: Self-pay | Admitting: Family Medicine

## 2022-10-23 VITALS — BP 120/56 | HR 74 | Ht 65.0 in | Wt 172.0 lb

## 2022-10-23 DIAGNOSIS — H6123 Impacted cerumen, bilateral: Secondary | ICD-10-CM

## 2022-10-23 NOTE — Progress Notes (Signed)
   Acute Office Visit  Subjective:     Patient ID: Anthony [name], male    DOB: 1945-03-31, 77 y.o.   MRN: [id number]  CC: cerumen impaction    HPI Patient is in today for cerumen impaction.   At last visit he was noted to have bilateral cerumen impaction and encouraged to try Debrox drops at home. States he hasn't noticed any improvement and would like [place] to try irrigating today. Denies fevers, chills, pain, dizziness.   ROS All review of systems negative except what is listed in the HPI      Objective:    BP (!) 120/56   Pulse 74   Ht 5\' 5"  (1.651 m)   Wt 172 lb (78 kg)   SpO2 94%   BMI 28.62 kg/m     Physical Exam Vitals reviewed.  Constitutional:      Appearance: Normal appearance.  HENT:     Right Ear: There is impacted cerumen.     Left Ear: There is impacted cerumen.  Neurological:     Mental Status: He is alert and oriented to person, place, and time.  Psychiatric:        Mood and Affect: Mood normal.        Behavior: Behavior normal.        Thought Content: Thought content normal.        Judgment: Judgment normal.     No results found for any visits on 10/23/22.      Assessment & Plan:   Problem List Items Addressed This Visit   None Visit Diagnoses     Bilateral impacted cerumen    -  Primary     Indication: Cerumen impaction of the ear(s)  Medical necessity statement: On physical examination, cerumen impairs clinically significant portions of the external auditory canal, and tympanic membrane. Noted obstructive, copious cerumen that cannot be removed without magnification and instrumentations requiring professional removal.   Consent: Discussed benefits and risks of procedure and verbal consent obtained  Procedure: Patient was prepped for the procedure. Otoscope utilized to assess and take note of the ear canal, the tympanic membrane, and the presence, amount, and placement of the cerumen.  Gentle irrigation with water at body  temperature and soft plastic curette utilized to remove impacted cerumen. Second attempt after allowing docusate to soak. Excess water drained by gravity and ear canal(s) dried with clean guaze.  Post procedure examination: Otoscopic examination reveals partial cerumen removal with no damage to the auditory canal, or surrounding tissue.  Patient tolerated procedure well.   Post procedure instructions: Patient made aware that they may experience temporary vertigo, temporary changes in hearing, and temporary discomfort. If these symptom last for more than 24 hours to call the clinic or proceed to the ED for further evaluation. Discussed avoiding placing objects into the ear canal for cleaning. Advised continuing Debrox drops and diluted peroxide soaks  No orders of the defined types were placed in this encounter.   Return if symptoms worsen or fail to improve.   Clayborne Dana, NP

## 2022-10-24 ENCOUNTER — Encounter (INDEPENDENT_AMBULATORY_CARE_PROVIDER_SITE_OTHER): Payer: Self-pay

## 2022-11-12 DIAGNOSIS — H6123 Impacted cerumen, bilateral: Secondary | ICD-10-CM | POA: Diagnosis not present

## 2022-11-21 ENCOUNTER — Ambulatory Visit (INDEPENDENT_AMBULATORY_CARE_PROVIDER_SITE_OTHER): Payer: Medicare Other | Admitting: Family Medicine

## 2022-11-21 ENCOUNTER — Other Ambulatory Visit (HOSPITAL_BASED_OUTPATIENT_CLINIC_OR_DEPARTMENT_OTHER): Payer: Self-pay

## 2022-11-21 VITALS — BP 130/64 | HR 71 | Temp 98.0°F | Resp 16 | Ht 65.0 in | Wt 178.2 lb

## 2022-11-21 DIAGNOSIS — N200 Calculus of kidney: Secondary | ICD-10-CM

## 2022-11-21 DIAGNOSIS — E119 Type 2 diabetes mellitus without complications: Secondary | ICD-10-CM

## 2022-11-21 DIAGNOSIS — Z86010 Personal history of colonic polyps: Secondary | ICD-10-CM | POA: Diagnosis not present

## 2022-11-21 DIAGNOSIS — I10 Essential (primary) hypertension: Secondary | ICD-10-CM | POA: Diagnosis not present

## 2022-11-21 DIAGNOSIS — E782 Mixed hyperlipidemia: Secondary | ICD-10-CM | POA: Diagnosis not present

## 2022-11-21 DIAGNOSIS — Z23 Encounter for immunization: Secondary | ICD-10-CM

## 2022-11-21 DIAGNOSIS — Z0000 Encounter for general adult medical examination without abnormal findings: Secondary | ICD-10-CM

## 2022-11-21 DIAGNOSIS — Z9889 Other specified postprocedural states: Secondary | ICD-10-CM

## 2022-11-21 DIAGNOSIS — R739 Hyperglycemia, unspecified: Secondary | ICD-10-CM

## 2022-11-21 LAB — CBC WITH DIFFERENTIAL/PLATELET
Basophils Absolute: 0.1 10*3/uL (ref 0.0–0.1)
Basophils Relative: 1.3 % (ref 0.0–3.0)
Eosinophils Absolute: 0.1 10*3/uL (ref 0.0–0.7)
Eosinophils Relative: 1.6 % (ref 0.0–5.0)
HCT: 41.5 % (ref 39.0–52.0)
Hemoglobin: 13.5 g/dL (ref 13.0–17.0)
Lymphocytes Relative: 30.4 % (ref 12.0–46.0)
Lymphs Abs: 2.2 10*3/uL (ref 0.7–4.0)
MCHC: 32.6 g/dL (ref 30.0–36.0)
MCV: 96.2 fl (ref 78.0–100.0)
Monocytes Absolute: 0.9 10*3/uL (ref 0.1–1.0)
Monocytes Relative: 12.6 % — ABNORMAL HIGH (ref 3.0–12.0)
Neutro Abs: 3.9 10*3/uL (ref 1.4–7.7)
Neutrophils Relative %: 54.1 % (ref 43.0–77.0)
Platelets: 284 10*3/uL (ref 150.0–400.0)
RBC: 4.31 Mil/uL (ref 4.22–5.81)
RDW: 14.1 % (ref 11.5–15.5)
WBC: 7.3 10*3/uL (ref 4.0–10.5)

## 2022-11-21 LAB — COMPREHENSIVE METABOLIC PANEL
ALT: 21 U/L (ref 0–53)
AST: 19 U/L (ref 0–37)
Albumin: 4 g/dL (ref 3.5–5.2)
Alkaline Phosphatase: 63 U/L (ref 39–117)
BUN: 14 mg/dL (ref 6–23)
CO2: 29 meq/L (ref 19–32)
Calcium: 8.9 mg/dL (ref 8.4–10.5)
Chloride: 102 meq/L (ref 96–112)
Creatinine, Ser: 0.84 mg/dL (ref 0.40–1.50)
GFR: 84.17 mL/min (ref >60.00)
Glucose, Bld: 94 mg/dL (ref 70–99)
Potassium: 3.7 meq/L (ref 3.5–5.1)
Sodium: 140 meq/L (ref 135–145)
Total Bilirubin: 0.8 mg/dL (ref 0.2–1.2)
Total Protein: 6.8 g/dL (ref 6.0–8.3)

## 2022-11-21 LAB — LIPID PANEL
Cholesterol: 96 mg/dL (ref 0–200)
HDL: 41.8 mg/dL (ref >39.00)
LDL Cholesterol: 19 mg/dL (ref 0–99)
NonHDL: 53.97
Total CHOL/HDL Ratio: 2
Triglycerides: 177 mg/dL — ABNORMAL HIGH (ref 0.0–149.0)
VLDL: 35.4 mg/dL (ref 0.0–40.0)

## 2022-11-21 LAB — MICROALBUMIN / CREATININE URINE RATIO
Creatinine,U: 49.7 mg/dL
Microalb Creat Ratio: 6.3 mg/g (ref 0.0–30.0)
Microalb, Ur: 3.1 mg/dL — ABNORMAL HIGH (ref 0.0–1.9)

## 2022-11-21 LAB — TSH: TSH: 3.54 u[IU]/mL (ref 0.35–5.50)

## 2022-11-21 LAB — HEMOGLOBIN A1C: Hgb A1c MFr Bld: 6.7 % — ABNORMAL HIGH (ref 4.6–6.5)

## 2022-11-21 MED ORDER — NEOMYCIN-POLYMYXIN-HC 3.5-10000-1 OT SOLN
3.0000 [drp] | Freq: Three times a day (TID) | OTIC | 0 refills | Status: DC | PRN
  Filled 2022-11-21: qty 10, 22d supply, fill #0

## 2022-11-21 NOTE — Assessment & Plan Note (Signed)
No recent flares 

## 2022-11-21 NOTE — Assessment & Plan Note (Signed)
Tolerating statin, encouraged heart healthy diet, avoid trans fats, minimize simple carbs and saturated fats. Increase exercise as tolerated 

## 2022-11-21 NOTE — Assessment & Plan Note (Signed)
Well controlled, no changes to meds. Encouraged heart healthy diet such as the DASH diet and exercise as tolerated.  °

## 2022-11-21 NOTE — Assessment & Plan Note (Signed)
hgba1c acceptable, minimize simple carbs. Increase exercise as tolerated.  

## 2022-11-21 NOTE — Patient Instructions (Addendum)
RSV, Respiratory Syncitial Virus, arexvy at pharmacy  Covid booster    Netflix Live to 100 the Blue Zones  4000, 8000 steps  60-80 ounces of fluids daily, 10 ounces every 1-2 hours  Preventive Care 65 Years and Older, Male Preventive care refers to lifestyle choices and visits with your health care provider that can promote health and wellness. Preventive care visits are also called wellness exams. What can I expect for my preventive care visit? Counseling During your preventive care visit, your health care provider may ask about your: Medical history, including: Past medical problems. Family medical history. History of falls. Current health, including: Emotional well-being. Home life and relationship well-being. Sexual activity. Memory and ability to understand (cognition). Lifestyle, including: Alcohol, nicotine or tobacco, and drug use. Access to firearms. Diet, exercise, and sleep habits. Work and work Astronomer. Sunscreen use. Safety issues such as seatbelt and bike helmet use. Physical exam Your health care provider will check your: Height and weight. These may be used to calculate your BMI (body mass index). BMI is a measurement that tells if you are at a healthy weight. Waist circumference. This measures the distance around your waistline. This measurement also tells if you are at a healthy weight and may help predict your risk of certain diseases, such as type 2 diabetes and high blood pressure. Heart rate and blood pressure. Body temperature. Skin for abnormal spots. What immunizations do I need?  Vaccines are usually given at various ages, according to a schedule. Your health care provider will recommend vaccines for you based on your age, medical history, and lifestyle or other factors, such as travel or where you work. What tests do I need? Screening Your health care provider may recommend screening tests for certain conditions. This may include: Lipid and  cholesterol levels. Diabetes screening. This is done by checking your blood sugar (glucose) after you have not eaten for a while (fasting). Hepatitis C test. Hepatitis B test. HIV (human immunodeficiency virus) test. STI (sexually transmitted infection) testing, if you are at risk. Lung cancer screening. Colorectal cancer screening. Prostate cancer screening. Abdominal aortic aneurysm (AAA) screening. You may need this if you are a current or former smoker. Talk with your health care provider about your test results, treatment options, and if necessary, the need for more tests. Follow these instructions at home: Eating and drinking  Eat a diet that includes fresh fruits and vegetables, whole grains, lean protein, and low-fat dairy products. Limit your intake of foods with high amounts of sugar, saturated fats, and salt. Take vitamin and mineral supplements as recommended by your health care provider. Do not drink alcohol if your health care provider tells you not to drink. If you drink alcohol: Limit how much you have to 0-2 drinks a day. Know how much alcohol is in your drink. In the [place] one drink equals one 12 oz bottle of beer (355 mL), one 5 oz glass of wine (148 mL), or one 1 oz glass of hard liquor (44 mL). Lifestyle Brush your teeth every morning and night with fluoride toothpaste. Floss one time each day. Exercise for at least 30 minutes 5 or more days each week. Do not use any products that contain nicotine or tobacco. These products include cigarettes, chewing tobacco, and vaping devices, such as e-cigarettes. If you need help quitting, ask your health care provider. Do not use drugs. If you are sexually active, practice safe sex. Use a condom or other form of protection to prevent STIs. Take  aspirin only as told by your health care provider. Make sure that you understand how much to take and what form to take. Work with your health care provider to find out whether it is safe  and beneficial for you to take aspirin daily. Ask your health care provider if you need to take a cholesterol-lowering medicine (statin). Find healthy ways to manage stress, such as: Meditation, yoga, or listening to music. Journaling. Talking to a trusted person. Spending time with friends and family. Safety Always wear your seat belt while driving or riding in a vehicle. Do not drive: If you have been drinking alcohol. Do not ride with someone who has been drinking. When you are tired or distracted. While texting. If you have been using any mind-altering substances or drugs. Wear a helmet and other protective equipment during sports activities. If you have firearms in your house, make sure you follow all gun safety procedures. Minimize exposure to UV radiation to reduce your risk of skin cancer. What's next? Visit your health care provider once a year for an annual wellness visit. Ask your health care provider how often you should have your eyes and teeth checked. Stay up to date on all vaccines. This information is not intended to replace advice given to you by your health care provider. Make sure you discuss any questions you have with your health care provider. Document Revised: 08/23/2020 Document Reviewed: 08/23/2020 Elsevier Patient Education  2024 [employer].

## 2022-11-21 NOTE — Assessment & Plan Note (Addendum)
Patient denies any difficulties at home. No trouble with ADLs, depression or falls. See EMR for functional status screen and depression screen. No recent changes to vision or hearing. Is UTD with immunizations. Is UTD with screening. Discussed Advanced Directives. Encouraged heart healthy diet, exercise as tolerated and adequate sleep. See patient's problem list for health risk factors to monitor. See AVS for preventative healthcare recommendation schedule. Referred to gastroenterology and dermatology for ongoing care Continues to see Dr Jens Som of cardiology  Continues to see Dr Kathrynn Running of urology Colonoscopy 2021 repeat in 2026

## 2022-11-22 ENCOUNTER — Encounter: Payer: Self-pay | Admitting: Family Medicine

## 2022-11-22 DIAGNOSIS — E119 Type 2 diabetes mellitus without complications: Secondary | ICD-10-CM | POA: Insufficient documentation

## 2022-11-22 DIAGNOSIS — Z23 Encounter for immunization: Secondary | ICD-10-CM | POA: Insufficient documentation

## 2022-11-22 HISTORY — DX: Type 2 diabetes mellitus without complications: E11.9

## 2022-11-22 NOTE — Progress Notes (Unsigned)
Subjective:    Patient ID: Anthony [name], male    DOB: 04/12/1945, 77 y.o.   MRN: [id number]  Chief Complaint  Patient presents with  . Annual Exam    Annual Exam    HPI Discussed the use of AI scribe software for clinical note transcription with the patient, who gave verbal consent to proceed.  History of Present Illness   The patient, with a history of diabetes, presents with a primary complaint of ear discomfort. They report a sensation of fullness and occasional itching in the ear, which they initially attributed to wax build-up. The patient sought treatment at an urgent care center and attempted self-care with ear drops, but the discomfort persisted. The patient also mentions a recent respiratory illness, which they believe may have contributed to their current ear symptoms.  In addition to the ear discomfort, the patient discusses their diabetes management. They report an A1c of 6.5 and are considering the use of a glucometer for better monitoring of their blood sugar levels. The patient also mentions regular check-ups with urology and dermatology specialists, and a recent colonoscopy. They are up-to-date with their vaccinations, including shingles and pneumonia, and are considering the new RSV vaccination.        Past Medical History:  Diagnosis Date  . Allergic rhinitis   . BPH (benign prostatic hyperplasia)   . CAD (coronary artery disease) 05/26/2009 :  primary cardiolgoist-  dr Jens Som   hx NSTEMI cardiac catherization  revealed-left main normal; LAD proximal calcification,  long proximal 30% stenosis,  Circumflex  AV groove   proximal  60% stenosis at mid obtuse marginal,  first large mid obtuse marginal  ostial 70% stenosis,  prox. to mid RCA  99% stenosis w/ intervention PCI and DES  . Carotid stenosis, asymptomatic, bilateral    per last duplex 03-31-2015  bilateral ICA 1-39%  . Cataract    removed  . External hemorrhoids    large external  . GERD  (gastroesophageal reflux disease)   . Glaucoma   . History of adenomatous polyp of colon   . History of kidney stones   . History of non-ST elevation myocardial infarction (NSTEMI) 05/26/2009  . Hyperlipidemia   . Hypertension    8-10 years  . Left ureteral stone   . Macular degeneration   . Myocardial infarction (HCC)   . Nephrolithiasis    right side non-obstructive per CT 01-03-2017  . S/P drug eluting coronary stent placement 05/26/2009   DESx1  to pRCA  . Wears glasses     Past Surgical History:  Procedure Laterality Date  . CARDIOVASCULAR STRESS TEST  12/26/2011   dr Jens Som   normal nuclear study w/ no ischemia/  normal LV function and wall motion , ef 71%  . CATARACT EXTRACTION W/ INTRAOCULAR LENS  IMPLANT, BILATERAL  2010  . COLONOSCOPY  last one 06-10-2016  . CORONARY ANGIOPLASTY WITH STENT PLACEMENT  05-26-2009  dr hochrein/ dr Smitty Cords brodie   Severe RCA stenosis and moderate stenosis in the left system, ef 50% with inferior hypokinsis:  PCI and DES x1 to RCA (Promus)  . CYSTOSCOPY W/ RETROGRADES Bilateral 09/29/2013   Procedure: CYSTOSCOPY WITH RETROGRADE PYELOGRAM;  Surgeon: Sebastian Ache, MD;  Location: [facility];  Service: Urology;  Laterality: Bilateral;  . CYSTOSCOPY WITH RETROGRADE PYELOGRAM, URETEROSCOPY AND STENT PLACEMENT N/A 06/14/2015   Procedure: CYSTOSCOPY WITH RETROGRADE PYELOGRAM, cysto litholapexy ;  Surgeon: Sebastian Ache, MD;  Location: [facility];  Service: Urology;  Laterality:  N/A;  . CYSTOSCOPY/URETEROSCOPY/HOLMIUM LASER/STENT PLACEMENT Left 01/07/2017   Procedure: CYSTOSCOPY/RETROGRADE/URETEROSCOPY/HOLMIUM LASER/STENT PLACEMENT, FULGERATION OF PROSTATIC URETHRA;  Surgeon: Rene Paci, MD;  Location: [facility];  Service: Urology;  Laterality: Left;  . CYSTOSCOPY/URETEROSCOPY/HOLMIUM LASER/STENT PLACEMENT Right 01/12/2020   Procedure: CYSTOSCOPY/ RIGHT RETROGRADE/ RIGHT URETEROSCOPY/ RIGHT HOLMIUM LASER/  RIGHT STENT PLACEMENT;  Surgeon: Rene Paci, MD;  Location: [facility];  Service: Urology;  Laterality: Right;  ONLY NEEDS 60 MIN  . EYE SURGERY    . HOLMIUM LASER APPLICATION N/A 06/14/2015   Procedure: HOLMIUM LASER APPLICATION bladder stone;  Surgeon: Sebastian Ache, MD;  Location: [facility];  Service: Urology;  Laterality: N/A;  . HOLMIUM LASER APPLICATION Left 01/07/2017   Procedure: HOLMIUM LASER APPLICATION;  Surgeon: Rene Paci, MD;  Location: [facility];  Service: Urology;  Laterality: Left;  . NEPHROLITHOTOMY Left 09/29/2013   Procedure: NEPHROLITHOTOMY PERCUTANEOUS WITH ACCESS;  Surgeon: Sebastian Ache, MD;  Location: [facility];  Service: Urology;  Laterality: Left;  . URETEROSCOPY N/A 09/29/2013   Procedure: URETEROSCOPY;  Surgeon: Sebastian Ache, MD;  Location: [facility];  Service: Urology;  Laterality: N/A;    Family History  Problem Relation Age of Onset  . Coronary artery disease Father        in his 53's  . Hyperlipidemia Father   . Hypertension Father   . Hearing loss Father   . Heart disease Mother        deceased of heart disease  . Hypertension Mother   . Hyperlipidemia Mother   . Multiple sclerosis Son   . Heart disease Son   . Heart disease Maternal Grandmother   . Heart disease Maternal Grandfather   . Heart disease Paternal Grandmother   . Heart disease Paternal Grandfather   . GER disease Sister   . Heart disease Paternal Aunt   . Other Neg Hx        no lung cancer, colon cancer, or prostate  . Colon cancer Neg Hx   . Stomach cancer Neg Hx   . Esophageal cancer Neg Hx     Social History   Socioeconomic History  . Marital status: Married    Spouse name: Not on file  . Number of children: Not on file  . Years of education: Not on file  . Highest education level: Associate degree: academic program  Occupational History  . Not on file  Tobacco Use  . Smoking status: Former     Current packs/day: 0.00    Types: Cigarettes    Start date: 03/11/1962    Quit date: 03/12/2007    Years since quitting: 15.7  . Smokeless tobacco: Never  Substance and Sexual Activity  . Alcohol use: No  . Drug use: No  . 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 Not on file  Social History Narrative   married -43 yrs  Myriam Jacobson [place])   grew up in [place]   He has one son - two grandaugthers     He is retired.  He had     a 40 pack-year smoking history but has discontinued.     prev occuptation -  [employer]   Alcohol use-no   Smoking Status:  quit   Packs/Day:  0.5   Caffeine use/day:  2 beverages daily   Does Patient Exercise:  yes  Social Determinants of Health   Financial Resource Strain: Low Risk  (10/07/2022)   Overall Financial Resource Strain (CARDIA)   . Difficulty of Paying Living Expenses: Not hard at all  Food Insecurity: No Food Insecurity (10/07/2022)   Hunger Vital Sign   . Worried About Programme researcher, broadcasting/film/video in the Last Year: Never true   . Ran Out of Food in the Last Year: Never true  Transportation Needs: No Transportation Needs (10/07/2022)   PRAPARE - Transportation   . Lack of Transportation (Medical): No   . Lack of Transportation (Non-Medical): No  Physical Activity: Sufficiently Active (10/07/2022)   Exercise Vital Sign   . Days of Exercise per Week: 7 days   . Minutes of Exercise per Session: 120 min  Stress: No Stress Concern Present (10/07/2022)   [employer] of Occupational Health - Occupational Stress Questionnaire   . Feeling of Stress : Not at all  Social Connections: Moderately Integrated (10/07/2022)   Social Connection and Isolation Panel [NHANES]   . Frequency of Communication with Friends and Family: More than three times a week   . Frequency of Social Gatherings with Friends and Family: Three times a week   . Attends Religious  Services: 1 to 4 times per year   . Active Member of Clubs or Organizations: No   . Attends Banker Meetings: Never   . Marital Status: Married  Catering manager Violence: Not At Risk (09/25/2022)   Humiliation, Afraid, Rape, and Kick questionnaire   . Fear of Current or Ex-Partner: No   . Emotionally Abused: No   . Physically Abused: No   . Sexually Abused: No    Outpatient Medications Prior to Visit  Medication Sig Dispense Refill  . amLODipine (NORVASC) 10 MG tablet TAKE 1 TABLET BY MOUTH IN THE  MORNING 90 tablet 3  . ascorbic acid (VITAMIN C) 500 MG tablet Take 500 mg by mouth daily.    Marland Kitchen aspirin 81 MG tablet Take 81 mg by mouth every morning.    . gabapentin (NEURONTIN) 300 MG capsule Take 300 mg by mouth 2 (two) times daily. One capsule at lunch and two capsule at bedtime    . hydrochlorothiazide (MICROZIDE) 12.5 MG capsule TAKE 1 CAPSULE BY MOUTH DAILY 100 capsule 1  . losartan (COZAAR) 100 MG tablet TAKE 1 TABLET BY MOUTH IN THE  MORNING 90 tablet 3  . metoprolol tartrate (LOPRESSOR) 25 MG tablet TAKE ONE-HALF TABLET BY MOUTH  TWICE DAILY 200 tablet 1  . Multiple Vitamins-Minerals (ICAPS PO) Take by mouth every morning.     . potassium citrate (UROCIT-K) 10 MEQ (1080 MG) SR tablet Take 10 mEq by mouth in the morning and at bedtime.   3  . rosuvastatin (CRESTOR) 40 MG tablet TAKE 1 TABLET BY MOUTH DAILY 100 tablet 1  . fluticasone (FLONASE) 50 MCG/ACT nasal spray Place 2 sprays into both nostrils daily. 16 g 6   No facility-administered medications prior to visit.    No Known Allergies  Review of Systems  Constitutional:  Negative for chills, fever and malaise/fatigue.  HENT:  Positive for congestion. Negative for hearing loss.   Eyes:  Negative for discharge.  Respiratory:  Negative for cough, sputum production and shortness of breath.   Cardiovascular:  Negative for chest pain, palpitations and leg swelling.  Gastrointestinal:  Negative for abdominal pain,  blood in stool, constipation, diarrhea, heartburn, nausea and vomiting.  Genitourinary:  Negative for dysuria, frequency, hematuria and urgency.  Musculoskeletal:  Negative for back pain, falls and myalgias.  Skin:  Negative for rash.  Neurological:  Negative for dizziness, sensory change, loss of consciousness, weakness and headaches.  Endo/Heme/Allergies:  Negative for environmental allergies. Does not bruise/bleed easily.  Psychiatric/Behavioral:  Negative for depression and suicidal ideas. The patient is not nervous/anxious and does not have insomnia.       Objective:    Physical Exam Vitals reviewed.  Constitutional:      General: He is not in acute distress.    Appearance: Normal appearance. He is not ill-appearing or diaphoretic.  HENT:     Head: Normocephalic and atraumatic.     Right Ear: Tympanic membrane, ear canal and external ear normal. There is no impacted cerumen.     Left Ear: Tympanic membrane, ear canal and external ear normal. There is no impacted cerumen.     Nose: Nose normal. No rhinorrhea.     Mouth/Throat:     Pharynx: Oropharynx is clear.  Eyes:     General: No scleral icterus.    Extraocular Movements: Extraocular movements intact.     Conjunctiva/sclera: Conjunctivae normal.     Pupils: Pupils are equal, round, and reactive to light.  Neck:     Thyroid: No thyroid mass or thyroid tenderness.  Cardiovascular:     Rate and Rhythm: Normal rate and regular rhythm.     Pulses: Normal pulses.     Heart sounds: Normal heart sounds. No murmur heard. Pulmonary:     Effort: Pulmonary effort is normal.     Breath sounds: Normal breath sounds. No wheezing.  Abdominal:     General: Bowel sounds are normal.     Palpations: Abdomen is soft. There is no mass.     Tenderness: There is no guarding.  Musculoskeletal:        General: No swelling. Normal range of motion.     Cervical back: Normal range of motion and neck supple. No rigidity.     Right lower leg: No  edema.     Left lower leg: No edema.  Lymphadenopathy:     Cervical: No cervical adenopathy.  Skin:    General: Skin is warm and dry.     Findings: No rash.  Neurological:     General: No focal deficit present.     Mental Status: He is alert and oriented to person, place, and time.     Cranial Nerves: No cranial nerve deficit.     Deep Tendon Reflexes: Reflexes normal.  Psychiatric:        Mood and Affect: Mood normal.        Behavior: Behavior normal.   BP 130/64 (BP Location: Left Arm, Patient Position: Sitting, Cuff Size: Normal)   Pulse 71   Temp 98 F (36.7 C) (Oral)   Resp 16   Ht 5\' 5"  (1.651 m)   Wt 178 lb 3.2 oz (80.8 kg)   SpO2 97%   BMI 29.65 kg/m  Wt Readings from Last 3 Encounters:  11/21/22 178 lb 3.2 oz (80.8 kg)  10/23/22 172 lb (78 kg)  10/08/22 172 lb (78 kg)    Diabetic Foot Exam - Simple   No data filed    Lab Results  Component Value Date   WBC 7.3 11/21/2022   HGB 13.5 11/21/2022   HCT 41.5 11/21/2022   PLT 284.0 11/21/2022   GLUCOSE 94 11/21/2022   CHOL 96 11/21/2022   TRIG 177.0 (H) 11/21/2022   HDL 41.80 11/21/2022   LDLDIRECT  41.0 04/20/2019   LDLCALC 19 11/21/2022   ALT 21 11/21/2022   AST 19 11/21/2022   NA 140 11/21/2022   K 3.7 11/21/2022   CL 102 11/21/2022   CREATININE 0.84 11/21/2022   BUN 14 11/21/2022   CO2 29 11/21/2022   TSH 3.54 11/21/2022   PSA 1.70ng/mL 02/19/2018   INR 1.05 05/25/2009   HGBA1C 6.7 (H) 11/21/2022   MICROALBUR 3.1 (H) 11/21/2022    Lab Results  Component Value Date   TSH 3.54 11/21/2022   Lab Results  Component Value Date   WBC 7.3 11/21/2022   HGB 13.5 11/21/2022   HCT 41.5 11/21/2022   MCV 96.2 11/21/2022   PLT 284.0 11/21/2022   Lab Results  Component Value Date   NA 140 11/21/2022   K 3.7 11/21/2022   CO2 29 11/21/2022   GLUCOSE 94 11/21/2022   BUN 14 11/21/2022   CREATININE 0.84 11/21/2022   BILITOT 0.8 11/21/2022   ALKPHOS 63 11/21/2022   AST 19 11/21/2022   ALT 21  11/21/2022   PROT 6.8 11/21/2022   ALBUMIN 4.0 11/21/2022   CALCIUM 8.9 11/21/2022   ANIONGAP 12 09/30/2013   EGFR 89 03/28/2021   GFR 84.17 11/21/2022   Lab Results  Component Value Date   CHOL 96 11/21/2022   Lab Results  Component Value Date   HDL 41.80 11/21/2022   Lab Results  Component Value Date   LDLCALC 19 11/21/2022   Lab Results  Component Value Date   TRIG 177.0 (H) 11/21/2022   Lab Results  Component Value Date   CHOLHDL 2 11/21/2022   Lab Results  Component Value Date   HGBA1C 6.7 (H) 11/21/2022       Assessment & Plan:  Hyperglycemia Assessment & Plan: hgba1c acceptable, minimize simple carbs. Increase exercise as tolerated.    Hyperlipidemia, mixed Assessment & Plan: Tolerating statin, encouraged heart healthy diet, avoid trans fats, minimize simple carbs and saturated fats. Increase exercise as tolerated  Orders: -     Lipid panel  Hypertension, unspecified type Assessment & Plan: Well controlled, no changes to meds. Encouraged heart healthy diet such as the DASH diet and exercise as tolerated.    Orders: -     CBC with Differential/Platelet -     Comprehensive metabolic panel -     TSH  Kidney stones Assessment & Plan: No recent flares   Preventative health care Assessment & Plan: Patient denies any difficulties at home. No trouble with ADLs, depression or falls. See EMR for functional status screen and depression screen. No recent changes to vision or hearing. Is UTD with immunizations. Is UTD with screening. Discussed Advanced Directives. Encouraged heart healthy diet, exercise as tolerated and adequate sleep. See patient's problem list for health risk factors to monitor. See AVS for preventative healthcare recommendation schedule. Referred to gastroenterology and dermatology for ongoing care Continues to see Dr Jens Som of cardiology  Continues to see Dr Kathrynn Running of urology Colonoscopy 2021 repeat in 2026   H/O colonoscopy  with polypectomy  Controlled type 2 diabetes mellitus without complication, without long-term current use of insulin (HCC) -     Hemoglobin A1c -     Microalbumin / creatinine urine ratio  Need for influenza vaccination -     Flu Vaccine Trivalent High Dose (Fluad)  Other orders -     Neomycin-Polymyxin-HC; Place 3 drops into both ears 3 (three) times daily as needed.  Dispense: 10 mL; Refill: 0    Assessment and  Plan    Eustachian Tube Dysfunction Post-respiratory infection with persistent ear fullness and mild nasal congestion. No active infection noted on exam. -Continue Flonase daily and nasal saline at night. -Perform gentle pressure exercises to help open Eustachian tubes. -Consider hydrogen peroxide drops for itching and wax buildup.  Ear Canal Wax Buildup Noted on exam, contributing to ear fullness. -Use hydrogen peroxide drops weekly to prevent wax buildup. -Prescribe ear drops for itching and irritation as needed.  Pre-Diabetes A1c of 6.5 noted on previous lab work. -Offered glucometer for home monitoring, patient to decide.  General Health Maintenance -Continue regular dental visits twice a year. -Plan for annual COVID booster in late October or early November. -Consider RSV (Arexvy) shot in early October. -Continue regular urology follow-ups for prostate monitoring. -Next colonoscopy due in 2026. -Encourage maintaining hydration and physical activity (aim for 8000 steps/day). -Order routine labs. -Follow-up in six months.         Danise Edge, MD

## 2022-11-25 ENCOUNTER — Encounter: Payer: Self-pay | Admitting: Family Medicine

## 2022-11-26 MED ORDER — LANCETS MISC. MISC
1.0000 | Freq: Three times a day (TID) | 0 refills | Status: AC
Start: 2022-11-26 — End: 2022-12-26

## 2022-11-26 MED ORDER — BLOOD GLUCOSE MONITORING SUPPL DEVI: 1.0000 | Freq: Three times a day (TID) | 0 refills | Status: DC

## 2022-11-26 MED ORDER — LANCET DEVICE MISC
1.0000 | Freq: Three times a day (TID) | 0 refills | Status: AC
Start: 2022-11-26 — End: 2022-12-26

## 2022-11-26 MED ORDER — BLOOD GLUCOSE TEST VI STRP
1.0000 | ORAL_STRIP | Freq: Three times a day (TID) | 0 refills | Status: AC
Start: 2022-11-26 — End: 2022-12-26

## 2022-12-03 ENCOUNTER — Other Ambulatory Visit (HOSPITAL_BASED_OUTPATIENT_CLINIC_OR_DEPARTMENT_OTHER): Payer: Self-pay

## 2022-12-12 ENCOUNTER — Encounter: Payer: Self-pay | Admitting: Family Medicine

## 2022-12-16 DIAGNOSIS — H6592 Unspecified nonsuppurative otitis media, left ear: Secondary | ICD-10-CM | POA: Diagnosis not present

## 2022-12-16 DIAGNOSIS — H93292 Other abnormal auditory perceptions, left ear: Secondary | ICD-10-CM | POA: Diagnosis not present

## 2022-12-16 DIAGNOSIS — H6992 Unspecified Eustachian tube disorder, left ear: Secondary | ICD-10-CM | POA: Diagnosis not present

## 2023-01-08 ENCOUNTER — Other Ambulatory Visit (HOSPITAL_BASED_OUTPATIENT_CLINIC_OR_DEPARTMENT_OTHER): Payer: Self-pay

## 2023-01-08 MED ORDER — TRAMADOL HCL 50 MG PO TABS
50.0000 mg | ORAL_TABLET | Freq: Four times a day (QID) | ORAL | 0 refills | Status: DC | PRN
  Filled 2023-01-08: qty 4, 1d supply, fill #0

## 2023-01-26 ENCOUNTER — Other Ambulatory Visit: Payer: Self-pay | Admitting: Family Medicine

## 2023-01-26 DIAGNOSIS — I10 Essential (primary) hypertension: Secondary | ICD-10-CM

## 2023-02-03 ENCOUNTER — Other Ambulatory Visit (HOSPITAL_BASED_OUTPATIENT_CLINIC_OR_DEPARTMENT_OTHER): Payer: Self-pay

## 2023-02-03 MED ORDER — CHLORHEXIDINE GLUCONATE 0.12 % MT SOLN
15.0000 mL | Freq: Two times a day (BID) | OROMUCOSAL | 0 refills | Status: DC
  Filled 2023-02-03: qty 473, 16d supply, fill #0

## 2023-02-10 ENCOUNTER — Other Ambulatory Visit (HOSPITAL_BASED_OUTPATIENT_CLINIC_OR_DEPARTMENT_OTHER): Payer: Self-pay

## 2023-02-14 ENCOUNTER — Other Ambulatory Visit (HOSPITAL_BASED_OUTPATIENT_CLINIC_OR_DEPARTMENT_OTHER): Payer: Self-pay

## 2023-02-14 ENCOUNTER — Other Ambulatory Visit: Payer: Self-pay | Admitting: Family Medicine

## 2023-02-14 MED ORDER — BLOOD GLUCOSE MONITOR SYSTEM W/DEVICE KIT
1.0000 | PACK | Freq: Three times a day (TID) | 0 refills | Status: AC
  Filled 2023-02-24: qty 1, 30d supply, fill #0

## 2023-02-19 ENCOUNTER — Other Ambulatory Visit (HOSPITAL_BASED_OUTPATIENT_CLINIC_OR_DEPARTMENT_OTHER): Payer: Self-pay

## 2023-02-21 ENCOUNTER — Other Ambulatory Visit: Payer: Self-pay | Admitting: Emergency Medicine

## 2023-02-21 MED ORDER — ACCU-CHEK SOFTCLIX LANCETS MISC: 1 refills | Status: AC

## 2023-02-21 MED ORDER — ACCU-CHEK GUIDE TEST VI STRP: ORAL_STRIP | 1 refills | Status: AC

## 2023-02-24 ENCOUNTER — Other Ambulatory Visit (HOSPITAL_BASED_OUTPATIENT_CLINIC_OR_DEPARTMENT_OTHER): Payer: Self-pay

## 2023-02-24 MED ORDER — ACCU-CHEK GUIDE TEST VI STRP
ORAL_STRIP | 0 refills | Status: DC
  Filled 2023-02-24: qty 100, 33d supply, fill #0

## 2023-03-07 ENCOUNTER — Other Ambulatory Visit (HOSPITAL_BASED_OUTPATIENT_CLINIC_OR_DEPARTMENT_OTHER): Payer: Self-pay

## 2023-03-25 ENCOUNTER — Other Ambulatory Visit (HOSPITAL_BASED_OUTPATIENT_CLINIC_OR_DEPARTMENT_OTHER): Payer: Self-pay

## 2023-03-25 DIAGNOSIS — N200 Calculus of kidney: Secondary | ICD-10-CM | POA: Diagnosis not present

## 2023-03-25 MED ORDER — POTASSIUM CITRATE ER 10 MEQ (1080 MG) PO TBCR
20.0000 meq | EXTENDED_RELEASE_TABLET | Freq: Two times a day (BID) | ORAL | 3 refills | Status: DC
  Filled 2023-03-25: qty 360, 90d supply, fill #0

## 2023-03-26 ENCOUNTER — Other Ambulatory Visit (HOSPITAL_BASED_OUTPATIENT_CLINIC_OR_DEPARTMENT_OTHER): Payer: Self-pay

## 2023-03-26 ENCOUNTER — Other Ambulatory Visit: Payer: Self-pay

## 2023-03-27 ENCOUNTER — Other Ambulatory Visit: Payer: Self-pay

## 2023-04-15 DIAGNOSIS — M545 Low back pain, unspecified: Secondary | ICD-10-CM | POA: Diagnosis not present

## 2023-04-21 DIAGNOSIS — H353131 Nonexudative age-related macular degeneration, bilateral, early dry stage: Secondary | ICD-10-CM | POA: Diagnosis not present

## 2023-04-23 ENCOUNTER — Other Ambulatory Visit: Payer: Self-pay | Admitting: Cardiology

## 2023-04-23 DIAGNOSIS — I10 Essential (primary) hypertension: Secondary | ICD-10-CM

## 2023-04-23 DIAGNOSIS — I2510 Atherosclerotic heart disease of native coronary artery without angina pectoris: Secondary | ICD-10-CM

## 2023-05-02 NOTE — Progress Notes (Signed)
 HPI: FU CAD; admitted in March 2011 with a non-ST elevation myocardial infarction. He underwent cardiac catheterization which revealed - Left main was normal. The LAD had proximal calcification. There was long proximal 30% stenosis. Circumflex in the AV groove had proximal 60% stenosis at a mid obtuse marginal. This first large mid obtuse marginal had ostial 70% stenosis. The right coronary artery had a proximal long 50% stenosis. There was mid subtotal stenosis. The EF was 50% with inferior hypokinesis. Patient had a drug-eluting stent to the right coronary artery at that time. Nuclear study in December of 2013 showed an ejection fraction of 71% and normal perfusion. Carotid Dopplers in Jan 2017 showed 1-39 bilateral stenosis.  Abdominal ultrasound January 2020 showed no aneurysm. Venous Dopplers right lower extremity September 2021 showed no DVT.  Since I last saw him, the patient denies any dyspnea on exertion, orthopnea, PND, pedal edema, palpitations, syncope or chest pain.   Current Outpatient Medications  Medication Sig Dispense Refill   Accu-Chek Softclix Lancets lancets Use to check blood sugar once a day  E11.9 100 each 1   amLODipine (NORVASC) 10 MG tablet TAKE 1 TABLET BY MOUTH IN THE  MORNING 100 tablet 2   ascorbic acid (VITAMIN C) 500 MG tablet Take 500 mg by mouth daily.     aspirin 81 MG tablet Take 81 mg by mouth every morning.     Blood Glucose Monitoring Suppl (BLOOD GLUCOSE MONITOR SYSTEM) w/Device KIT Use to check blood sugar in the morning, at noon, and at bedtime. 1 kit 0   gabapentin (NEURONTIN) 300 MG capsule Take 300 mg by mouth 2 (two) times daily. One capsule at lunch and two capsule at bedtime     glucose blood (ACCU-CHEK GUIDE TEST) test strip Use to check blood sugar once a day  E11.9 100 each 1   glucose blood (ACCU-CHEK GUIDE TEST) test strip Use to check blood sugar in the morning, atnoon, and at bedtime. 100 strip 0   hydrochlorothiazide (MICROZIDE) 12.5 MG  capsule TAKE 1 CAPSULE BY MOUTH DAILY 100 capsule 0   losartan (COZAAR) 100 MG tablet TAKE 1 TABLET BY MOUTH IN THE  MORNING 100 tablet 2   metoprolol tartrate (LOPRESSOR) 25 MG tablet TAKE ONE-HALF TABLET BY MOUTH  TWICE DAILY 200 tablet 1   Multiple Vitamins-Minerals (ICAPS PO) Take by mouth every morning.      potassium citrate (UROCIT-K) 10 MEQ (1080 MG) SR tablet Take 10 mEq by mouth in the morning and at bedtime.   3   rosuvastatin (CRESTOR) 40 MG tablet TAKE 1 TABLET BY MOUTH DAILY 100 tablet 0   chlorhexidine (PERIDEX) 0.12 % solution Take 15 mLs (1 capful) by mouth and rinse 2 (two) times daily after brushing teeth. Swish in mouth for 30 seconds then spit out; use for 2 to 5 days only. 473 mL 0   neomycin-polymyxin-hydrocortisone (CORTISPORIN) OTIC solution Place 3 drops into both ears 3 (three) times daily as needed. 10 mL 0   traMADol (ULTRAM) 50 MG tablet take 1 tablet by mouth every 6 hours as needed for pain 4 tablet 0   No current facility-administered medications for this visit.     Past Medical History:  Diagnosis Date   Allergic rhinitis    BPH (benign prostatic hyperplasia)    CAD (coronary artery disease) 05/26/2009 :  primary cardiolgoist-  dr Jens Som   hx NSTEMI cardiac catherization  revealed-left main normal; LAD proximal calcification,  long proximal 30% stenosis,  Circumflex  AV groove   proximal  60% stenosis at mid obtuse marginal,  first large mid obtuse marginal  ostial 70% stenosis,  prox. to mid RCA  99% stenosis w/ intervention PCI and DES   Carotid stenosis, asymptomatic, bilateral    per last duplex 03-31-2015  bilateral ICA 1-39%   Cataract    removed   Controlled type 2 diabetes mellitus without complication, without long-term current use of insulin (HCC) 11/22/2022   External hemorrhoids    large external   GERD (gastroesophageal reflux disease)    Glaucoma    History of adenomatous polyp of colon    History of kidney stones    History of non-ST  elevation myocardial infarction (NSTEMI) 05/26/2009   Hyperlipidemia    Hypertension    8-10 years   Left ureteral stone    Macular degeneration    Myocardial infarction [facility])    Nephrolithiasis    right side non-obstructive per CT 01-03-2017   S/P drug eluting coronary stent placement 05/26/2009   DESx1  to pRCA   Wears glasses     Past Surgical History:  Procedure Laterality Date   CARDIOVASCULAR STRESS TEST  12/26/2011   dr Jens Som   normal nuclear study w/ no ischemia/  normal LV function and wall motion , ef 71%   CATARACT EXTRACTION W/ INTRAOCULAR LENS  IMPLANT, BILATERAL  2010   COLONOSCOPY  last one 06-10-2016   CORONARY ANGIOPLASTY WITH STENT PLACEMENT  05-26-2009  dr hochrein/ dr Smitty Cords brodie   Severe RCA stenosis and moderate stenosis in the left system, ef 50% with inferior hypokinsis:  PCI and DES x1 to RCA ([facility])   CYSTOSCOPY W/ RETROGRADES Bilateral 09/29/2013   Procedure: CYSTOSCOPY WITH RETROGRADE PYELOGRAM;  Surgeon: Sebastian Ache, MD;  Location: [facility];  Service: Urology;  Laterality: Bilateral;   CYSTOSCOPY WITH RETROGRADE PYELOGRAM, URETEROSCOPY AND STENT PLACEMENT N/A 06/14/2015   Procedure: CYSTOSCOPY WITH RETROGRADE PYELOGRAM, cysto litholapexy ;  Surgeon: Sebastian Ache, MD;  Location[facility];  Service: Urology;  Laterality: N/A;   CYSTOSCOPY/URETEROSCOPY/HOLMIUM LASER/STENT PLACEMENT Left 01/07/2017   Procedure: CYSTOSCOPY/RETROGRADE/URETEROSCOPY/HOLMIUM LASER/STENT PLACEMENT, FULGERATION OF PROSTATIC URETHRA;  Surgeon: Rene Paci, MD;  Location: [facility];  Service: Urology;  Laterality: Left;   CYSTOSCOPY/URETEROSCOPY/HOLMIUM LASER/STENT PLACEMENT Right 01/12/2020   Procedure: CYSTOSCOPY/ RIGHT RETROGRADE/ RIGHT URETEROSCOPY/ RIGHT HOLMIUM LASER/ RIGHT STENT PLACEMENT;  Surgeon: Rene Paci, MD;  Location: [facility];  Service: Urology;  Laterality: Right;  ONLY NEEDS 60 MIN    EYE SURGERY     HOLMIUM LASER APPLICATION N/A 06/14/2015   Procedure: HOLMIUM LASER APPLICATION bladder stone;  Surgeon: Sebastian Ache, MD;  Location: [facility];  Service: Urology;  Laterality: N/A;   HOLMIUM LASER APPLICATION Left 01/07/2017   Procedure: HOLMIUM LASER APPLICATION;  Surgeon: Rene Paci, MD;  Location: [facility];  Service: Urology;  Laterality: Left;   NEPHROLITHOTOMY Left 09/29/2013   Procedure: NEPHROLITHOTOMY PERCUTANEOUS WITH ACCESS;  Surgeon: Sebastian Ache, MD;  Location: [facility];  Service: Urology;  Laterality: Left;   URETEROSCOPY N/A 09/29/2013   Procedure: URETEROSCOPY;  Surgeon: Sebastian Ache, MD;  Location: [facility];  Service: Urology;  Laterality: N/A;    Social History   Socioeconomic History   Marital status: Married    Spouse name: Not on file   Number of children: Not on file   Years of education: Not on file   Highest education level: Associate degree: academic program  Occupational History   Not on file  Tobacco Use   Smoking status: Former    Current packs/day: 0.00    Types: Cigarettes    Start date: 03/11/1962    Quit date: 03/12/2007    Years since quitting: 16.1   Smokeless tobacco: Never  Substance and Sexual Activity   Alcohol use: No   Drug use: No   Sexual activity: Yes    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  He had     a 40 pack-year smoking history but has discontinued.     prev occuptation -  [employer]   Alcohol use-no   Smoking Status:  quit   Packs/Day:  0.5   Caffeine use/day:  2 beverages daily   Does Patient Exercise:  yes            Social Drivers of Corporate investment banker Strain: Low Risk  (10/07/2022)   Overall Financial Resource Strain  (CARDIA)    Difficulty of Paying Living Expenses: Not hard at all  Food Insecurity: No Food Insecurity (10/07/2022)   Hunger Vital Sign    Worried About Running Out of Food in the Last Year: Never true    Ran Out of Food in the Last Year: Never true  Transportation Needs: No Transportation Needs (10/07/2022)   PRAPARE - Administrator, Civil Service (Medical): No    Lack of Transportation (Non-Medical): No  Physical Activity: Sufficiently Active (10/07/2022)   Exercise Vital Sign    Days of Exercise per Week: 7 days    Minutes of Exercise per Session: 120 min  Stress: No Stress Concern Present (10/07/2022)   [employer] of Occupational Health - Occupational Stress Questionnaire    Feeling of Stress : Not at all  Social Connections: Moderately Integrated (10/07/2022)   Social Connection and Isolation Panel [NHANES]    Frequency of Communication with Friends and Family: More than three times a week    Frequency of Social Gatherings with Friends and Family: Three times a week    Attends Religious Services: 1 to 4 times per year    Active Member of Clubs or Organizations: No    Attends Banker Meetings: Never    Marital Status: Married  Catering manager Violence: Not At Risk (09/25/2022)   Humiliation, Afraid, Rape, and Kick questionnaire    Fear of Current or Ex-Partner: No    Emotionally Abused: No    Physically Abused: No    Sexually Abused: No    Family History  Problem Relation Age of Onset   Coronary artery disease Father        in his 65's   Hyperlipidemia Father    Hypertension Father    Hearing loss Father    Heart disease Mother        deceased of heart disease   Hypertension Mother    Hyperlipidemia Mother    Multiple sclerosis Son    Heart disease Son    Heart disease Maternal Grandmother    Heart disease Maternal Grandfather    Heart disease Paternal Grandmother    Heart disease Paternal Grandfather    GER disease Sister    Heart  disease Paternal Aunt  Other Neg Hx        no lung cancer, colon cancer, or prostate   Colon cancer Neg Hx    Stomach cancer Neg Hx    Esophageal cancer Neg Hx     ROS: no fevers or chills, productive cough, hemoptysis, dysphasia, odynophagia, melena, hematochezia, dysuria, hematuria, rash, seizure activity, orthopnea, PND, pedal edema, claudication. Remaining systems are negative.  Physical Exam: Well-developed well-nourished in no acute distress.  Skin is warm and dry.  HEENT is normal.  Neck is supple.  Chest is clear to auscultation with normal expansion.  Cardiovascular exam is regular rate and rhythm.  Abdominal exam nontender or distended. No masses palpated. Extremities show no edema. neuro grossly intact  EKG Interpretation Date/Time:  Wednesday May 14 2023 15:15:34 EST Ventricular Rate:  60 PR Interval:  216 QRS Duration:  68 QT Interval:  398 QTC Calculation: 398 R Axis:   35  Text Interpretation: Sinus rhythm with 1st degree A-V block Possible Anterior infarct Confirmed by Olga Millers ([id number]) on 05/14/2023 3:28:45 PM    A/P  1 coronary artery disease-patient denies chest pain.  Continue medical therapy with aspirin and statin.  2 hypertension-blood pressure controlled.  Continue present medical regimen.  3 hyperlipidemia-continue statin.  4 carotid artery disease-mild on most recent Dopplers.  Olga Millers, MD

## 2023-05-14 ENCOUNTER — Ambulatory Visit: Payer: Medicare Other | Admitting: Cardiology

## 2023-05-14 ENCOUNTER — Encounter: Payer: Self-pay | Admitting: Cardiology

## 2023-05-14 VITALS — BP 118/58 | HR 60 | Ht 65.0 in | Wt 174.0 lb

## 2023-05-14 DIAGNOSIS — I25119 Atherosclerotic heart disease of native coronary artery with unspecified angina pectoris: Secondary | ICD-10-CM

## 2023-05-14 DIAGNOSIS — E7800 Pure hypercholesterolemia, unspecified: Secondary | ICD-10-CM

## 2023-05-14 DIAGNOSIS — I10 Essential (primary) hypertension: Secondary | ICD-10-CM | POA: Diagnosis not present

## 2023-05-14 NOTE — Patient Instructions (Signed)

## 2023-05-25 NOTE — Assessment & Plan Note (Signed)
 Hydrate and monitor

## 2023-05-25 NOTE — Assessment & Plan Note (Signed)
 Well controlled, no changes to meds. Encouraged heart healthy diet such as the DASH diet and exercise as tolerated.

## 2023-05-25 NOTE — Assessment & Plan Note (Signed)
 Tolerating statin, encouraged heart healthy diet, avoid trans fats, minimize simple carbs and saturated fats. Increase exercise as tolerated

## 2023-05-25 NOTE — Assessment & Plan Note (Signed)
 [id number] acceptable, minimize simple carbs. Increase exercise as tolerated. Continue current meds

## 2023-05-26 ENCOUNTER — Encounter: Payer: Self-pay | Admitting: Family Medicine

## 2023-05-26 ENCOUNTER — Ambulatory Visit (INDEPENDENT_AMBULATORY_CARE_PROVIDER_SITE_OTHER): Payer: Medicare Other | Admitting: Family Medicine

## 2023-05-26 VITALS — BP 132/64 | HR 62 | Temp 97.7°F | Resp 16 | Ht 65.0 in | Wt 174.6 lb

## 2023-05-26 DIAGNOSIS — N200 Calculus of kidney: Secondary | ICD-10-CM | POA: Diagnosis not present

## 2023-05-26 DIAGNOSIS — E782 Mixed hyperlipidemia: Secondary | ICD-10-CM | POA: Diagnosis not present

## 2023-05-26 DIAGNOSIS — E119 Type 2 diabetes mellitus without complications: Secondary | ICD-10-CM | POA: Diagnosis not present

## 2023-05-26 DIAGNOSIS — Z7984 Long term (current) use of oral hypoglycemic drugs: Secondary | ICD-10-CM

## 2023-05-26 DIAGNOSIS — I2510 Atherosclerotic heart disease of native coronary artery without angina pectoris: Secondary | ICD-10-CM

## 2023-05-26 DIAGNOSIS — I10 Essential (primary) hypertension: Secondary | ICD-10-CM | POA: Diagnosis not present

## 2023-05-26 LAB — COMPREHENSIVE METABOLIC PANEL
ALT: 14 U/L (ref 0–53)
AST: 13 U/L (ref 0–37)
Albumin: 4.4 g/dL (ref 3.5–5.2)
Alkaline Phosphatase: 67 U/L (ref 39–117)
BUN: 16 mg/dL (ref 6–23)
CO2: 30 meq/L (ref 19–32)
Calcium: 9.2 mg/dL (ref 8.4–10.5)
Chloride: 102 meq/L (ref 96–112)
Creatinine, Ser: 0.96 mg/dL (ref 0.40–1.50)
GFR: 76.02 mL/min (ref >60.00)
Glucose, Bld: 107 mg/dL — ABNORMAL HIGH (ref 70–99)
Potassium: 3.3 meq/L — ABNORMAL LOW (ref 3.5–5.1)
Sodium: 140 meq/L (ref 135–145)
Total Bilirubin: 0.8 mg/dL (ref 0.2–1.2)
Total Protein: 7 g/dL (ref 6.0–8.3)

## 2023-05-26 LAB — CBC WITH DIFFERENTIAL/PLATELET
Basophils Absolute: 0 10*3/uL (ref 0.0–0.1)
Basophils Relative: 0.7 % (ref 0.0–3.0)
Eosinophils Absolute: 0.2 10*3/uL (ref 0.0–0.7)
Eosinophils Relative: 2.3 % (ref 0.0–5.0)
HCT: 43 % (ref 39.0–52.0)
Hemoglobin: 14.5 g/dL (ref 13.0–17.0)
Lymphocytes Relative: 35.2 % (ref 12.0–46.0)
Lymphs Abs: 2.4 10*3/uL (ref 0.7–4.0)
MCHC: 33.8 g/dL (ref 30.0–36.0)
MCV: 95.1 fl (ref 78.0–100.0)
Monocytes Absolute: 0.7 10*3/uL (ref 0.1–1.0)
Monocytes Relative: 10.7 % (ref 3.0–12.0)
Neutro Abs: 3.5 10*3/uL (ref 1.4–7.7)
Neutrophils Relative %: 51.1 % (ref 43.0–77.0)
Platelets: 229 10*3/uL (ref 150.0–400.0)
RBC: 4.52 Mil/uL (ref 4.22–5.81)
RDW: 13.1 % (ref 11.5–15.5)
WBC: 6.9 10*3/uL (ref 4.0–10.5)

## 2023-05-26 LAB — TSH: TSH: 2.53 u[IU]/mL (ref 0.35–5.50)

## 2023-05-26 LAB — LIPID PANEL
Cholesterol: 92 mg/dL (ref 0–200)
HDL: 40.1 mg/dL (ref >39.00)
LDL Cholesterol: 26 mg/dL (ref 0–99)
NonHDL: 52.03
Total CHOL/HDL Ratio: 2
Triglycerides: 132 mg/dL (ref 0.0–149.0)
VLDL: 26.4 mg/dL (ref 0.0–40.0)

## 2023-05-26 LAB — MICROALBUMIN / CREATININE URINE RATIO
Creatinine,U: 63.2 mg/dL
Microalb Creat Ratio: 107.7 mg/g — ABNORMAL HIGH (ref 0.0–30.0)
Microalb, Ur: 6.8 mg/dL — ABNORMAL HIGH (ref 0.0–1.9)

## 2023-05-26 LAB — HEMOGLOBIN A1C: Hgb A1c MFr Bld: 6.5 % (ref 4.6–6.5)

## 2023-05-26 NOTE — Progress Notes (Signed)
 Subjective:    Patient ID: Anthony [name], male    DOB: 09-01-1945, 78 y.o.   MRN: [id number]  Chief Complaint  Patient presents with  . Follow-up    HPI Discussed the use of AI scribe software for clinical note transcription with the patient, who gave verbal consent to proceed.  History of Present Illness Anthony [name] "Reita Cliche" is a 78 year old male with hypertension and coronary artery disease who presents for routine follow-up and blood work.  He has been monitoring his blood pressure and blood sugar at home with good results. Blood sugar levels have been mostly between 80 to 110 mg/dL, with one high reading after eating cake, which resolved after a few hours. He checks his blood sugar once a day.  He has a history of coronary artery disease and had a heart attack in the past, for which he underwent rehabilitation. He follows a heart-healthy diet, including eating fish like salmon and sardines, and incorporates nuts into his diet. He is under the care of a cardiologist and has had no significant changes in his cardiac status.  He has a history of kidney stones and recently experienced discomfort while urinating, with small grains passing, which has since resolved. He was last evaluated by a urologist in January, with no significant findings at that time. He maintains good hydration, primarily drinking water.  He has a history of a hernia, which causes discomfort when he is physically active, such as when spreading mulch. He manages symptoms by eating small meals and using a recliner to alleviate pain when lying down. He uses antacids like Tums for relief.  He is physically active, averaging 1.3 miles of walking per day, and monitors his steps using his phone. No current issues with his feet.    Past Medical History:  Diagnosis Date  . Allergic rhinitis   . BPH (benign prostatic hyperplasia)   . CAD (coronary artery disease) 05/26/2009 :  primary cardiolgoist-  dr Jens Som    hx NSTEMI cardiac catherization  revealed-left main normal; LAD proximal calcification,  long proximal 30% stenosis,  Circumflex  AV groove   proximal  60% stenosis at mid obtuse marginal,  first large mid obtuse marginal  ostial 70% stenosis,  prox. to mid RCA  99% stenosis w/ intervention PCI and DES  . Carotid stenosis, asymptomatic, bilateral    per last duplex 03-31-2015  bilateral ICA 1-39%  . Cataract    removed  . Controlled type 2 diabetes mellitus without complication, without long-term current use of insulin (HCC) 11/22/2022  . External hemorrhoids    large external  . GERD (gastroesophageal reflux disease)   . Glaucoma   . History of adenomatous polyp of colon   . History of kidney stones   . History of non-ST elevation myocardial infarction (NSTEMI) 05/26/2009  . Hyperlipidemia   . Hypertension    8-10 years  . Left ureteral stone   . Macular degeneration   . Myocardial infarction (HCC)   . Nephrolithiasis    right side non-obstructive per CT 01-03-2017  . S/P drug eluting coronary stent placement 05/26/2009   DESx1  to pRCA  . Wears glasses     Past Surgical History:  Procedure Laterality Date  . CARDIOVASCULAR STRESS TEST  12/26/2011   dr Jens Som   normal nuclear study w/ no ischemia/  normal LV function and wall motion , ef 71%  . CATARACT EXTRACTION W/ INTRAOCULAR LENS  IMPLANT, BILATERAL  2010  . COLONOSCOPY  last one 06-10-2016  . CORONARY ANGIOPLASTY WITH STENT PLACEMENT  05-26-2009  dr hochrein/ dr Smitty Cords brodie   Severe RCA stenosis and moderate stenosis in the left system, ef 50% with inferior hypokinsis:  PCI and DES x1 to RCA ([facility])  . CYSTOSCOPY W/ RETROGRADES Bilateral 09/29/2013   Procedure: CYSTOSCOPY WITH RETROGRADE PYELOGRAM;  Surgeon: Sebastian Ache, MD;  Location: [facility];  Service: Urology;  Laterality: Bilateral;  . CYSTOSCOPY WITH RETROGRADE PYELOGRAM, URETEROSCOPY AND STENT PLACEMENT N/A 06/14/2015   Procedure: CYSTOSCOPY WITH RETROGRADE  PYELOGRAM, cysto litholapexy ;  Surgeon: Sebastian Ache, MD;  [facility];  Service: Urology;  Laterality: N/A;  . CYSTOSCOPY/URETEROSCOPY/HOLMIUM LASER/STENT PLACEMENT Left 01/07/2017   Procedure: CYSTOSCOPY/RETROGRADE/URETEROSCOPY/HOLMIUM LASER/STENT PLACEMENT, FULGERATION OF PROSTATIC URETHRA;  Surgeon: Rene Paci, MD;  Location: [facility];  Service: Urology;  Laterality: Left;  . CYSTOSCOPY/URETEROSCOPY/HOLMIUM LASER/STENT PLACEMENT Right 01/12/2020   Procedure: CYSTOSCOPY/ RIGHT RETROGRADE/ RIGHT URETEROSCOPY/ RIGHT HOLMIUM LASER/ RIGHT STENT PLACEMENT;  Surgeon: Rene Paci, MD;  Location: [facility];  Service: Urology;  Laterality: Right;  ONLY NEEDS 60 MIN  . EYE SURGERY    . HOLMIUM LASER APPLICATION N/A 06/14/2015   Procedure: HOLMIUM LASER APPLICATION bladder stone;  Surgeon: Sebastian Ache, MD;  Location: [facility];  Service: Urology;  Laterality: N/A;  . HOLMIUM LASER APPLICATION Left 01/07/2017   Procedure: HOLMIUM LASER APPLICATION;  Surgeon: Rene Paci, MD;  Location: [facility];  Service: Urology;  Laterality: Left;  . NEPHROLITHOTOMY Left 09/29/2013   Procedure: NEPHROLITHOTOMY PERCUTANEOUS WITH ACCESS;  Surgeon: Sebastian Ache, MD;  Location: [facility];  Service: Urology;  Laterality: Left;  . URETEROSCOPY N/A 09/29/2013   Procedure: URETEROSCOPY;  Surgeon: Sebastian Ache, MD;  Location: [facility];  Service: Urology;  Laterality: N/A;    Family History  Problem Relation Age of Onset  . Coronary artery disease Father        in his 74's  . Hyperlipidemia Father   . Hypertension Father   . Hearing loss Father   . Heart disease Mother        deceased of heart disease  . Hypertension Mother   . Hyperlipidemia Mother   . Multiple sclerosis Son   . Heart disease Son   . Heart disease Maternal Grandmother   . Heart disease Maternal Grandfather   .  Heart disease Paternal Grandmother   . Heart disease Paternal Grandfather   . GER disease Sister   . Heart disease Paternal Aunt   . Other Neg Hx        no lung cancer, colon cancer, or prostate  . Colon cancer Neg Hx   . Stomach cancer Neg Hx   . Esophageal cancer Neg Hx     Social History   Socioeconomic History  . Marital status: Married    Spouse name: Not on file  . Number of children: Not on file  . Years of education: Not on file  . Highest education level: Associate degree: occupational, Scientist, product/process development, or vocational program  Occupational History  . Not on file  Tobacco Use  . Smoking status: Former    Current packs/day: 0.00    Types: Cigarettes    Start date: 03/11/1962    Quit date: 03/12/2007    Years since quitting: 16.2  . Smokeless tobacco: Never  Substance and Sexual Activity  . Alcohol use: No  . Drug use: No  . 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 Not on file  Social History Narrative   married -43 yrs  Myriam Jacobson Petros)   grew up in [place]   He has one son - two grandaugthers     He is retired.  He had     a 40 pack-year smoking history but has discontinued.     prev occuptation -  [employer]   Alcohol use-no   Smoking Status:  quit   Packs/Day:  0.5   Caffeine use/day:  2 beverages daily   Does Patient Exercise:  yes            Social Drivers of Corporate investment banker Strain: Low Risk  (05/19/2023)   Overall Financial Resource Strain (CARDIA)   . Difficulty of Paying Living Expenses: Not hard at all  Food Insecurity: No Food Insecurity (05/19/2023)   Hunger Vital Sign   . Worried About Programme researcher, broadcasting/film/video in the Last Year: Never true   . Ran Out of Food in the Last Year: Never true  Transportation Needs: No Transportation Needs (05/19/2023)   PRAPARE - Transportation   . Lack of Transportation (Medical): No   . Lack of Transportation (Non-Medical):  No  Physical Activity: Sufficiently Active (05/19/2023)   Exercise Vital Sign   . Days of Exercise per Week: 7 days   . Minutes of Exercise per Session: 120 min  Stress: No Stress Concern Present (05/19/2023)   [employer] of Occupational Health - Occupational Stress Questionnaire   . Feeling of Stress : Not at all  Social Connections: Moderately Integrated (05/19/2023)   Social Connection and Isolation Panel [NHANES]   . Frequency of Communication with Friends and Family: More than three times a week   . Frequency of Social Gatherings with Friends and Family: More than three times a week   . Attends Religious Services: 1 to 4 times per year   . Active Member of Clubs or Organizations: No   . Attends Banker Meetings: Never   . Marital Status: Married  Catering manager Violence: Not At Risk (09/25/2022)   Humiliation, Afraid, Rape, and Kick questionnaire   . Fear of Current or Ex-Partner: No   . Emotionally Abused: No   . Physically Abused: No   . Sexually Abused: No    Outpatient Medications Prior to Visit  Medication Sig Dispense Refill  . Accu-Chek Softclix Lancets lancets Use to check blood sugar once a day  E11.9 100 each 1  . amLODipine (NORVASC) 10 MG tablet TAKE 1 TABLET BY MOUTH IN THE  MORNING 100 tablet 2  . ascorbic acid (VITAMIN C) 500 MG tablet Take 500 mg by mouth daily.    Marland Kitchen aspirin 81 MG tablet Take 81 mg by mouth every morning.    . Blood Glucose Monitoring Suppl (BLOOD GLUCOSE MONITOR SYSTEM) w/Device KIT Use to check blood sugar in the morning, at noon, and at bedtime. 1 kit 0  . gabapentin (NEURONTIN) 300 MG capsule Take 300 mg by mouth 2 (two) times daily. One capsule at lunch and two capsule at bedtime    . glucose blood (ACCU-CHEK GUIDE TEST) test strip Use to check blood sugar once a day  E11.9 100 each 1  . hydrochlorothiazide (MICROZIDE) 12.5 MG capsule TAKE 1 CAPSULE BY MOUTH DAILY 100 capsule 0  . losartan (COZAAR) 100 MG tablet TAKE 1  TABLET BY MOUTH IN THE  MORNING 100 tablet  2  . metoprolol tartrate (LOPRESSOR) 25 MG tablet TAKE ONE-HALF TABLET BY MOUTH  TWICE DAILY 200 tablet 1  . Multiple Vitamins-Minerals (ICAPS PO) Take by mouth every morning.     . potassium citrate (UROCIT-K) 10 MEQ (1080 MG) SR tablet Take 10 mEq by mouth in the morning and at bedtime.   3  . rosuvastatin (CRESTOR) 40 MG tablet TAKE 1 TABLET BY MOUTH DAILY 100 tablet 0  . chlorhexidine (PERIDEX) 0.12 % solution Take 15 mLs (1 capful) by mouth and rinse 2 (two) times daily after brushing teeth. Swish in mouth for 30 seconds then spit out; use for 2 to 5 days only. 473 mL 0  . glucose blood (ACCU-CHEK GUIDE TEST) test strip Use to check blood sugar in the morning, atnoon, and at bedtime. 100 strip 0  . neomycin-polymyxin-hydrocortisone (CORTISPORIN) OTIC solution Place 3 drops into both ears 3 (three) times daily as needed. 10 mL 0  . traMADol (ULTRAM) 50 MG tablet take 1 tablet by mouth every 6 hours as needed for pain 4 tablet 0   No facility-administered medications prior to visit.    No Known Allergies  Review of Systems  Constitutional:  Negative for fever and malaise/fatigue.  HENT:  Negative for congestion.   Eyes:  Negative for blurred vision.  Respiratory:  Negative for shortness of breath.   Cardiovascular:  Negative for chest pain, palpitations and leg swelling.  Gastrointestinal:  Negative for abdominal pain, blood in stool and nausea.  Genitourinary:  Positive for dysuria. Negative for frequency.  Musculoskeletal:  Negative for falls.  Skin:  Negative for rash.  Neurological:  Negative for dizziness, loss of consciousness and headaches.  Endo/Heme/Allergies:  Negative for environmental allergies.  Psychiatric/Behavioral:  Negative for depression. The patient is not nervous/anxious.        Objective:    Physical Exam Vitals reviewed.  Constitutional:      Appearance: Normal appearance. He is not ill-appearing.  HENT:      Head: Normocephalic and atraumatic.     Nose: Nose normal.  Eyes:     Conjunctiva/sclera: Conjunctivae normal.  Cardiovascular:     Rate and Rhythm: Normal rate.     Pulses: Normal pulses.     Heart sounds: Normal heart sounds. No murmur heard. Pulmonary:     Effort: Pulmonary effort is normal.     Breath sounds: Normal breath sounds. No wheezing.  Abdominal:     Palpations: Abdomen is soft. There is no mass.     Tenderness: There is no abdominal tenderness.  Musculoskeletal:     Cervical back: Normal range of motion.     Right lower leg: No edema.     Left lower leg: No edema.  Skin:    General: Skin is warm and dry.  Neurological:     General: No focal deficit present.     Mental Status: He is alert and oriented to person, place, and time.  Psychiatric:        Mood and Affect: Mood normal.    BP 132/64 (BP Location: Left Arm, Patient Position: Sitting, Cuff Size: Normal)   Pulse 62   Temp 97.7 F (36.5 C) (Oral)   Resp 16   Ht 5\' 5"  (1.651 m)   Wt 174 lb 9.6 oz (79.2 kg)   SpO2 98%   BMI 29.05 kg/m  Wt Readings from Last 3 Encounters:  05/26/23 174 lb 9.6 oz (79.2 kg)  05/14/23 174 lb (78.9 kg)  11/21/22 178 lb  3.2 oz (80.8 kg)    Diabetic Foot Exam - Simple   Simple Foot Form Diabetic Foot exam was performed with the following findings: Yes 05/26/2023 11:03 AM  Visual Inspection No deformities, no ulcerations, no other skin breakdown bilaterally: Yes Sensation Testing Intact to touch and monofilament testing bilaterally: Yes Pulse Check Posterior Tibialis and Dorsalis pulse intact bilaterally: Yes Comments    Lab Results  Component Value Date   WBC 6.9 05/26/2023   HGB 14.5 05/26/2023   HCT 43.0 05/26/2023   PLT 229.0 05/26/2023   GLUCOSE 107 (H) 05/26/2023   CHOL 92 05/26/2023   TRIG 132.0 05/26/2023   HDL 40.10 05/26/2023   LDLDIRECT 41.0 04/20/2019   LDLCALC 26 05/26/2023   ALT 14 05/26/2023   AST 13 05/26/2023   NA 140 05/26/2023   K 3.3 (L)  05/26/2023   CL 102 05/26/2023   CREATININE 0.96 05/26/2023   BUN 16 05/26/2023   CO2 30 05/26/2023   TSH 2.53 05/26/2023   PSA 1.70ng/mL 02/19/2018   INR 1.05 05/25/2009   HGBA1C 6.5 05/26/2023   MICROALBUR 6.8 (H) 05/26/2023    Lab Results  Component Value Date   TSH 2.53 05/26/2023   Lab Results  Component Value Date   WBC 6.9 05/26/2023   HGB 14.5 05/26/2023   HCT 43.0 05/26/2023   MCV 95.1 05/26/2023   PLT 229.0 05/26/2023   Lab Results  Component Value Date   NA 140 05/26/2023   K 3.3 (L) 05/26/2023   CO2 30 05/26/2023   GLUCOSE 107 (H) 05/26/2023   BUN 16 05/26/2023   CREATININE 0.96 05/26/2023   BILITOT 0.8 05/26/2023   ALKPHOS 67 05/26/2023   AST 13 05/26/2023   ALT 14 05/26/2023   PROT 7.0 05/26/2023   ALBUMIN 4.4 05/26/2023   CALCIUM 9.2 05/26/2023   ANIONGAP 12 09/30/2013   EGFR 89 03/28/2021   GFR 76.02 05/26/2023   Lab Results  Component Value Date   CHOL 92 05/26/2023   Lab Results  Component Value Date   HDL 40.10 05/26/2023   Lab Results  Component Value Date   LDLCALC 26 05/26/2023   Lab Results  Component Value Date   TRIG 132.0 05/26/2023   Lab Results  Component Value Date   CHOLHDL 2 05/26/2023   Lab Results  Component Value Date   HGBA1C 6.5 05/26/2023       Assessment & Plan:  Controlled type 2 diabetes mellitus without complication, without long-term current use of insulin (HCC) Assessment & Plan: hgba1c acceptable, minimize simple carbs. Increase exercise as tolerated. Continue current meds, his home numbers 80-120 only one number at 176 after eating cake but then it resolved quickly  Orders: -     Comprehensive metabolic panel -     Hemoglobin A1c -     Microalbumin / creatinine urine ratio  Hyperlipidemia, mixed Assessment & Plan: Tolerating statin, encouraged heart healthy diet, avoid trans fats, minimize simple carbs and saturated fats. Increase exercise as tolerated  Orders: -     Lipid  panel  Hypertension, unspecified type Assessment & Plan: Well controlled, no changes to meds. Encouraged heart healthy diet such as the DASH diet and exercise as tolerated.    Orders: -     CBC with Differential/Platelet -     TSH  Kidney stones Assessment & Plan: Hydrate and monitor    Atherosclerosis of native coronary artery of native heart without angina pectoris Assessment & Plan: Follows with cardiology, Dr Jens Som  Assessment and Plan Assessment & Plan Type 2 Diabetes Mellitus Blood sugar well-controlled with occasional postprandial spikes. He understands dietary impacts. Discussed risks of hyperglycemia and hypoglycemia. - Continue current diabetes management plan. - Monitor blood sugar twice weekly unless symptoms arise. - Educate on pairing carbohydrates with proteins to moderate blood sugar spikes.  Hypertension Blood pressure well-managed with current medication regimen. - Continue current antihypertensive medications.  Kidney Stones Recent dysuria and passage of small stones with resolved symptoms. Discussed hydration and potential role of well water and genetics. - Encourage hydration and consider adding lemon to water to prevent stone formation.  Hiatal Hernia Intermittent acid reflux manageable with lifestyle modifications and occasional antacids. Surgery not indicated. - Continue lifestyle modifications, including smaller meals and avoiding fatty foods late at night. - Use antacids as needed for symptom relief.  General Health Maintenance Maintains active lifestyle. Regular cardiology and eye care follow-ups. No current diabetic foot issues. Discussed importance of foot protection. - Perform foot examination today to check for sensation and any issues. - Send the name of the eye doctor via MyChart for record updates. - Continue regular follow-ups with cardiology and eye care.  Follow-up Routine follow-up and monitoring in place. Blood work due  for kidney function and glucose levels. Discussed potential medication additions if glucose levels rise. - Order blood work today to check glucose levels and kidney function. - Schedule follow-up in 6-7 months unless issues arise.     Danise Edge, MD

## 2023-05-26 NOTE — Patient Instructions (Signed)

## 2023-05-26 NOTE — Assessment & Plan Note (Signed)
 Follows with cardiology, Dr Jens Som

## 2023-05-27 ENCOUNTER — Ambulatory Visit: Payer: Medicare Other | Admitting: Family Medicine

## 2023-05-27 MED ORDER — EMPAGLIFLOZIN 10 MG PO TABS: 10.0000 mg | ORAL_TABLET | Freq: Every day | ORAL | 3 refills | Status: DC

## 2023-05-27 NOTE — Addendum Note (Signed)
 Addended by: Maximino Sarin on: 05/27/2023 10:07 AM   Modules accepted: Orders

## 2023-06-06 ENCOUNTER — Encounter: Payer: Self-pay | Admitting: Family Medicine

## 2023-07-19 ENCOUNTER — Other Ambulatory Visit: Payer: Self-pay | Admitting: Cardiology

## 2023-07-19 DIAGNOSIS — I2510 Atherosclerotic heart disease of native coronary artery without angina pectoris: Secondary | ICD-10-CM

## 2023-07-19 DIAGNOSIS — I10 Essential (primary) hypertension: Secondary | ICD-10-CM

## 2023-07-28 ENCOUNTER — Other Ambulatory Visit: Payer: Self-pay | Admitting: Cardiology

## 2023-07-28 DIAGNOSIS — I2510 Atherosclerotic heart disease of native coronary artery without angina pectoris: Secondary | ICD-10-CM

## 2023-10-05 ENCOUNTER — Other Ambulatory Visit: Payer: Self-pay | Admitting: Family Medicine

## 2023-10-05 DIAGNOSIS — I10 Essential (primary) hypertension: Secondary | ICD-10-CM

## 2023-10-15 ENCOUNTER — Ambulatory Visit (INDEPENDENT_AMBULATORY_CARE_PROVIDER_SITE_OTHER): Admitting: *Deleted

## 2023-10-15 ENCOUNTER — Telehealth: Payer: Self-pay | Admitting: *Deleted

## 2023-10-15 VITALS — Ht 65.0 in | Wt 174.0 lb

## 2023-10-15 DIAGNOSIS — Z0000 Encounter for general adult medical examination without abnormal findings: Secondary | ICD-10-CM

## 2023-10-15 NOTE — Progress Notes (Signed)
 Please attest this visit in the absence of patient primary care provider.    Subjective:   Anthony [name] is a 78 y.o. who presents for a Medicare Wellness preventive visit.  As a reminder, Annual Wellness Visits don't include a physical exam, and some assessments may be limited, especially if this visit is performed virtually. We may recommend an in-person follow-up visit with your provider if needed.  Visit Complete: Virtual I connected with  Anthony [name] on 10/15/23 by a audio enabled telemedicine application and verified that I am speaking with the correct person using two identifiers.  Patient Location: Home  Provider Location: Office/Clinic  I discussed the limitations of evaluation and management by telemedicine. The patient expressed understanding and agreed to proceed.  Vital Signs: Because this visit was a virtual/telehealth visit, some criteria may be missing or patient reported. Any vitals not documented were not able to be obtained and vitals that have been documented are patient reported.  VideoDeclined- This patient declined Librarian, academic. Therefore the visit was completed with audio only.  Persons Participating in Visit: Patient.  AWV Questionnaire: Yes: Patient Medicare AWV questionnaire was completed by the patient on 10/09/23; I have confirmed that all information answered by patient is correct and no changes since this date.  Cardiac Risk Factors include: advanced age (>8men, >89 women);dyslipidemia;diabetes mellitus;hypertension;Other (see comment), Risk factor comments: carotid stenosis     Objective:    Today's Vitals   10/15/23 0901  Weight: 174 lb (78.9 kg)  Height: 5' 5 (1.651 m)   Body mass index is 28.96 kg/m.     10/15/2023    9:24 AM 09/25/2022    9:01 AM 09/20/2021    8:24 AM 09/14/2020    9:05 AM 01/12/2020   11:34 AM 11/13/2019   10:28 AM 04/23/2019    9:07 AM  Advanced Directives  Does Patient Have a  Medical Advance Directive? Yes Yes Yes Yes Yes Yes Yes  Type of Estate agent of [place];Living will Healthcare Power of [place];Living will Healthcare Power of [place];Living will Healthcare Power of [place];Living will Living will;Healthcare Power of [employer] Power of [place];Living will Living will;Healthcare Power of Attorney  Does patient want to make changes to medical advance directive? No - Patient declined No - Patient declined No - Patient declined   No - Patient declined No - Patient declined  Copy of Healthcare Power of Attorney in Chart? Yes - validated most recent copy scanned in chart (See row information) Yes - validated most recent copy scanned in chart (See row information) Yes - validated most recent copy scanned in chart (See row information) Yes - validated most recent copy scanned in chart (See row information) No - copy requested No - copy requested Yes - validated most recent copy scanned in chart (See row information)  Would patient like information on creating a medical advance directive?      No - Patient declined     Current Medications (verified) Outpatient Encounter Medications as of 10/15/2023  Medication Sig   Accu-Chek Softclix Lancets lancets Use to check blood sugar once a day  E11.9   amLODipine  (NORVASC ) 10 MG tablet TAKE 1 TABLET BY MOUTH IN THE  MORNING   ascorbic acid (VITAMIN C) 500 MG tablet Take 500 mg by mouth daily.   aspirin  81 MG tablet Take 81 mg by mouth every morning.   Blood Glucose Monitoring Suppl (BLOOD GLUCOSE MONITOR SYSTEM) w/Device KIT Use to check blood  sugar in the morning, at noon, and at bedtime.   gabapentin (NEURONTIN) 300 MG capsule Take 300 mg by mouth 2 (two) times daily. One capsule at lunch and two capsule at bedtime   glucose blood (ACCU-CHEK GUIDE TEST) test strip Use to check blood sugar once a day  E11.9   hydrochlorothiazide  (MICROZIDE ) 12.5 MG capsule TAKE 1 CAPSULE BY MOUTH DAILY    losartan  (COZAAR ) 100 MG tablet TAKE 1 TABLET BY MOUTH IN THE  MORNING   metoprolol  tartrate (LOPRESSOR ) 25 MG tablet TAKE ONE-HALF TABLET BY MOUTH  TWICE DAILY   Multiple Vitamins-Minerals (ICAPS PO) Take by mouth every morning.    potassium citrate  (UROCIT-K ) 10 MEQ (1080 MG) SR tablet Take 10 mEq by mouth in the morning and at bedtime.    rosuvastatin  (CRESTOR ) 40 MG tablet TAKE 1 TABLET BY MOUTH DAILY   empagliflozin  (JARDIANCE ) 10 MG TABS tablet Take 1 tablet (10 mg total) by mouth daily before breakfast. (Patient not taking: Reported on 10/15/2023)   No facility-administered encounter medications on file as of 10/15/2023.    Allergies (verified) Patient has no known allergies.   History: Past Medical History:  Diagnosis Date   Allergic rhinitis    BPH (benign prostatic hyperplasia)    CAD (coronary artery disease) 05/26/2009 :  primary cardiolgoist-  dr pietro   hx NSTEMI cardiac catherization  revealed-left main normal; LAD proximal calcification,  long proximal 30% stenosis,  Circumflex  AV groove   proximal  60% stenosis at mid obtuse marginal,  first large mid obtuse marginal  ostial 70% stenosis,  prox. to mid RCA  99% stenosis w/ intervention PCI and DES   Carotid stenosis, asymptomatic, bilateral    per last duplex 03-31-2015  bilateral ICA 1-39%   Cataract    removed   Controlled type 2 diabetes mellitus without complication, without long-term current use of insulin (HCC) 11/22/2022   External hemorrhoids    large external   GERD (gastroesophageal reflux disease)    Glaucoma    History of adenomatous polyp of colon    History of kidney stones    History of non-ST elevation myocardial infarction (NSTEMI) 05/26/2009   Hyperlipidemia    Hypertension    8-10 years   Left ureteral stone    Macular degeneration    Myocardial infarction [facility])    Nephrolithiasis    right side non-obstructive per CT 01-03-2017   S/P drug eluting coronary stent placement 05/26/2009   DESx1   to pRCA   Wears glasses    Past Surgical History:  Procedure Laterality Date   CARDIOVASCULAR STRESS TEST  12/26/2011   dr pietro   normal nuclear study w/ no ischemia/  normal LV function and wall motion , ef 71%   CATARACT EXTRACTION W/ INTRAOCULAR LENS  IMPLANT, BILATERAL  2010   COLONOSCOPY  last one 06-10-2016   CORONARY ANGIOPLASTY WITH STENT PLACEMENT  05-26-2009  dr hochrein/ dr wolm brodie   Severe RCA stenosis and moderate stenosis in the left system, ef 50% with inferior hypokinsis:  PCI and DES x1 to RCA ([facility])   CYSTOSCOPY W/ RETROGRADES Bilateral 09/29/2013   Procedure: CYSTOSCOPY WITH RETROGRADE PYELOGRAM;  Surgeon: Ricardo Likens, MD;  Location: [facility];  Service: Urology;  Laterality: Bilateral;   CYSTOSCOPY WITH RETROGRADE PYELOGRAM, URETEROSCOPY AND STENT PLACEMENT N/A 06/14/2015   Procedure: CYSTOSCOPY WITH RETROGRADE PYELOGRAM, cysto litholapexy ;  Surgeon: Ricardo Likens, MD;  Location: [facility];  Service: Urology;  Laterality: N/A;  CYSTOSCOPY/URETEROSCOPY/HOLMIUM LASER/STENT PLACEMENT Left 01/07/2017   Procedure: CYSTOSCOPY/RETROGRADE/URETEROSCOPY/HOLMIUM LASER/STENT PLACEMENT, FULGERATION OF PROSTATIC URETHRA;  Surgeon: Devere Lonni Righter, MD;  Location: [facility];  Service: Urology;  Laterality: Left;   CYSTOSCOPY/URETEROSCOPY/HOLMIUM LASER/STENT PLACEMENT Right 01/12/2020   Procedure: CYSTOSCOPY/ RIGHT RETROGRADE/ RIGHT URETEROSCOPY/ RIGHT HOLMIUM LASER/ RIGHT STENT PLACEMENT;  Surgeon: Devere Lonni Righter, MD;  Location: [facility];  Service: Urology;  Laterality: Right;  ONLY NEEDS 60 MIN   EYE SURGERY     HOLMIUM LASER APPLICATION N/A 06/14/2015   Procedure: HOLMIUM LASER APPLICATION bladder stone;  Surgeon: Ricardo Likens, MD;  Location: [facility];  Service: Urology;  Laterality: N/A;   HOLMIUM LASER APPLICATION Left 01/07/2017   Procedure: HOLMIUM LASER APPLICATION;  Surgeon:  Devere Lonni Righter, MD;  Location: [facility];  Service: Urology;  Laterality: Left;   NEPHROLITHOTOMY Left 09/29/2013   Procedure: NEPHROLITHOTOMY PERCUTANEOUS WITH ACCESS;  Surgeon: Ricardo Likens, MD;  Location: [facility];  Service: Urology;  Laterality: Left;   URETEROSCOPY N/A 09/29/2013   Procedure: URETEROSCOPY;  Surgeon: Ricardo Likens, MD;  Location: [facility];  Service: Urology;  Laterality: N/A;   Family History  Problem Relation Age of Onset   Coronary artery disease Father        in his 64's   Hyperlipidemia Father    Hypertension Father    Hearing loss Father    Heart disease Mother        deceased of heart disease   Hypertension Mother    Hyperlipidemia Mother    Multiple sclerosis Son    Heart disease Son    Heart disease Maternal Grandmother    Heart disease Maternal Grandfather    Heart disease Paternal Grandmother    Heart disease Paternal Grandfather    GER disease Sister    Heart disease Paternal Aunt    Other Neg Hx        no lung cancer, colon cancer, or prostate   Colon cancer Neg Hx    Stomach cancer Neg Hx    Esophageal cancer Neg Hx    Social History   Socioeconomic History   Marital status: Married    Spouse name: Not on file   Number of children: Not on file   Years of education: Not on file   Highest education level: Associate degree: occupational, Scientist, product/process development, or vocational program  Occupational History   Not on file  Tobacco Use   Smoking status: Former    Current packs/day: 0.00    Types: Cigarettes    Start date: 03/11/1962    Quit date: 03/12/2007    Years since quitting: 16.6   Smokeless tobacco: Never  Substance and Sexual Activity   Alcohol use: No   Drug use: No   Sexual activity: Yes    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  He had     a 40 pack-year smoking history but has discontinued.     prev occuptation -  [employer]   Alcohol use-no   Smoking Status:  quit   Packs/Day:  0.5   Caffeine use/day:  2 beverages daily   Does Patient Exercise:  yes  Social Drivers of Corporate investment banker Strain: Low Risk  (10/09/2023)   Overall Financial Resource Strain (CARDIA)    Difficulty of Paying Living Expenses: Not hard at all  Food Insecurity: No Food Insecurity (10/09/2023)   Hunger Vital Sign    Worried About Running Out of Food in the Last Year: Never true    Ran Out of Food in the Last Year: Never true  Transportation Needs: No Transportation Needs (10/15/2023)   PRAPARE - Administrator, Civil Service (Medical): No    Lack of Transportation (Non-Medical): No  Physical Activity: Sufficiently Active (10/09/2023)   Exercise Vital Sign    Days of Exercise per Week: 7 days    Minutes of Exercise per Session: 150+ min  Stress: No Stress Concern Present (10/09/2023)   [employer] of Occupational Health - Occupational Stress Questionnaire    Feeling of Stress: Not at all  Social Connections: Moderately Integrated (10/09/2023)   Social Connection and Isolation Panel    Frequency of Communication with Friends and Family: More than three times a week    Frequency of Social Gatherings with Friends and Family: More than three times a week    Attends Religious Services: 1 to 4 times per year    Active Member of [employer] or Organizations: No    Attends Engineer, structural: Not on file    Marital Status: Married    Tobacco Counseling Counseling given: Not Answered    Clinical Intake:  Pre-visit preparation completed: Yes  Pain : No/denies pain     BMI - recorded: 28.96 Nutritional Status: BMI 25 -29 Overweight Nutritional Risks: None Diabetes: Yes CBG done?: No  Lab Results  Component Value Date   HGBA1C 6.5 05/26/2023   HGBA1C  6.7 (H) 11/21/2022   HGBA1C 6.5 12/14/2021     How often do you need to have someone help you when you read instructions, pamphlets, or other written materials from your doctor or pharmacy?: 1 - Never What is the last grade level you completed in school?: Associate's  Interpreter Needed?: No  Information entered by :: Lolita Libra, CMA   Activities of Daily Living     10/09/2023   10:00 AM  In your present state of health, do you have any difficulty performing the following activities:  Hearing? 0  Vision? 0  Difficulty concentrating or making decisions? 0  Walking or climbing stairs? 0  Dressing or bathing? 0  Doing errands, shopping? 0  Preparing Food and eating ? N  Using the Toilet? N  In the past six months, have you accidently leaked urine? N  Do you have problems with loss of bowel control? N  Managing your Medications? N  Managing your Finances? N  Housekeeping or managing your Housekeeping? N    Patient Care Team: Domenica Harlene LABOR, MD as PCP - General (Family Medicine) Alvaro Ricardo KATHEE Raddle., MD as Consulting Physician (Urology) Pietro Redell RAMAN, MD as Consulting Physician (Cardiology) Devere Lonni Righter, MD as Consulting Physician (Urology) Pyrtle, Gordy HERO, MD as Consulting Physician (Gastroenterology) Avel Jon KATHEE, OD (Optometry) Court Pulling, MD as Referring Physician (Dermatology)  I have updated your Care Teams any recent Medical Services you may have received from other providers in the past year.     Assessment:   This is a routine wellness examination for [place].  Hearing/Vision screen Hearing Screening - Comments:: Denies hearing difficulties.  Vision Screening - Comments:: Last eye exam up to date with Jon  Sellers, copy requested.    Goals Addressed   None    Depression Screen     10/15/2023    9:24 AM 10/15/2023    9:08 AM 09/25/2022    9:04 AM 05/16/2022   10:43 AM 09/20/2021    8:24 AM 05/29/2021    8:54 AM 09/14/2020     9:07 AM  PHQ 2/9 Scores  PHQ - 2 Score 0 0 0 0 0 0 0  PHQ- 9 Score 0 0  0       Fall Risk     10/09/2023   10:00 AM 11/21/2022    8:24 AM 09/19/2022    2:46 PM 05/16/2022   10:43 AM 09/20/2021    8:24 AM  Fall Risk   Falls in the past year? 0 0 0 0 0  Number falls in past yr:  0 0 0 0  Injury with Fall?  0 0 0 0  Risk for fall due to :   No Fall Risks  No Fall Risks  Follow up  Falls evaluation completed Falls evaluation completed Falls evaluation completed Falls evaluation completed      Data saved with a previous flowsheet row definition    MEDICARE RISK AT HOME:  Medicare Risk at Home Any stairs in or around the home?: (Patient-Rptd) No Adequate lighting in your home to reduce risk of falls?: (Patient-Rptd) Yes Life alert?: (Patient-Rptd) No Use of a cane, walker or w/c?: (Patient-Rptd) No Shower chair or bench in shower?: (Patient-Rptd) No Elevated toilet seat or a handicapped toilet?: (Patient-Rptd) No  TIMED UP AND GO:  Was the test performed?  No,audio  Cognitive Function: 6CIT completed    04/20/2018    1:13 PM 04/18/2017    8:07 AM  MMSE - Mini Mental State Exam  Orientation to time 5 5   Orientation to Place 5 5   Registration 3 3   Attention/ Calculation 5 5   Recall 3 3   Language- name 2 objects 2 2   Language- repeat 1 1  Language- follow 3 step command 3 3   Language- read & follow direction 1 1   Write a sentence 1 1   Copy design 1 1   Total score 30 30      Data saved with a previous flowsheet row definition        09/25/2022    9:04 AM 09/20/2021    8:28 AM  6CIT Screen  What Year? 0 points 0 points  What month? 0 points 0 points  What time? 0 points 0 points  Count back from 20 0 points 0 points  Months in reverse 0 points 0 points  Repeat phrase 0 points 0 points  Total Score 0 points 0 points    Immunizations Immunization History  Administered Date(s) Administered   Fluad Quad(high Dose 65+) 11/06/2018, 12/02/2019, 12/01/2020,  12/14/2021   Fluad Trivalent(High Dose 65+) 11/21/2022   Influenza Split 12/07/2010, 11/20/2011, 12/05/2011   Influenza Whole 01/06/2009, 11/20/2009   Influenza, High Dose Seasonal PF 12/06/2015, 12/17/2016, 12/01/2017   Influenza,inj,Quad PF,6+ Mos 12/09/2012, 12/15/2013, 11/30/2014   PFIZER(Purple Top)SARS-COV-2 Vaccination 04/18/2019, 05/12/2019, 02/11/2020   Pneumococcal Conjugate-13 04/14/2015   Pneumococcal Polysaccharide-23 05/11/2009, 12/17/2016   Td 10/27/2007   Tdap 11/02/2020   Zoster Recombinant(Shingrix) 12/24/2017, 03/09/2018   Zoster, Live 05/30/2008    Screening Tests Health Maintenance  Topic Date Due   OPHTHALMOLOGY EXAM  Never done   Medicare Annual Wellness (AWV)  09/25/2023   INFLUENZA  VACCINE  10/10/2023   COVID-19 Vaccine (4 - 2024-25 season) 10/14/2024 (Originally 11/10/2022)   HEMOGLOBIN A1C  11/26/2023   Diabetic kidney evaluation - eGFR measurement  05/25/2024   Diabetic kidney evaluation - Urine ACR  05/25/2024   FOOT EXAM  05/25/2024   Colonoscopy  08/04/2024   DTaP/Tdap/Td (3 - Td or Tdap) 11/03/2030   Pneumococcal Vaccine: 50+ Years  Completed   Hepatitis C Screening  Completed   Zoster Vaccines- Shingrix  Completed   Hepatitis B Vaccines  Aged Out   HPV VACCINES  Aged Out   Meningococcal B Vaccine  Aged Out    Health Maintenance  Health Maintenance Due  Topic Date Due   OPHTHALMOLOGY EXAM  Never done   Medicare Annual Wellness (AWV)  09/25/2023   INFLUENZA VACCINE  10/10/2023   Health Maintenance Items Addressed: Most recent eye exam requested. Will get flu vaccine at upcoming OV.  All other HM up to date.  Additional Screening:  Vision Screening: Recommended annual ophthalmology exams for early detection of glaucoma and other disorders of the eye. Would you like a referral to an eye doctor? No    Dental Screening: Recommended annual dental exams for proper oral hygiene  Community Resource Referral / Chronic Care Management: CRR  required this visit?  No   CCM required this visit?  No   Plan:    I have personally reviewed and noted the following in the patient's chart:   Medical and social history Use of alcohol, tobacco or illicit drugs  Current medications and supplements including opioid prescriptions. Patient is not currently taking opioid prescriptions. Functional ability and status Nutritional status Physical activity Advanced directives List of other physicians Hospitalizations, surgeries, and ER visits in previous 12 months Vitals Screenings to include cognitive, depression, and falls Referrals and appointments  In addition, I have reviewed and discussed with patient certain preventive protocols, quality metrics, and best practice recommendations. A written personalized care plan for preventive services as well as general preventive health recommendations were provided to patient.   Lolita Libra, CMA   10/15/2023   After Visit Summary: (MyChart) Due to this being a telephonic visit, the after visit summary with patients personalized plan was offered to patient via MyChart   Notes: see phone note

## 2023-10-15 NOTE — Patient Instructions (Signed)
 [name] , Thank you for taking time out of your busy schedule to complete your Annual Wellness Visit with me. I enjoyed our conversation and look forward to speaking with you again next year. I, as well as your care team,  appreciate your ongoing commitment to your health goals. Please review the following plan we discussed and let me know if I can assist you in the future. Your Game plan/ To Do List   Follow up Visits: Next Medicare AWV with our clinical staff: 10/15/24 9am    Next Office Visit with your provider: 11/27/23 8:40am  Clinician Recommendations:  Aim for 30 minutes of exercise or brisk walking, 6-8 glasses of water , and 5 servings of fruits and vegetables each day.   Reminder to get your flu vaccine at your next visit with Dr Domenica.     This is a list of the screening recommended for you and due dates:  Health Maintenance  Topic Date Due   Eye exam for diabetics  Never done   Medicare Annual Wellness Visit  09/25/2023   Flu Shot  10/10/2023   COVID-19 Vaccine (4 - 2024-25 season) 10/14/2024*   Hemoglobin A1C  11/26/2023   Yearly kidney function blood test for diabetes  05/25/2024   Yearly kidney health urinalysis for diabetes  05/25/2024   Complete foot exam   05/25/2024   Colon Cancer Screening  08/04/2024   DTaP/Tdap/Td vaccine (3 - Td or Tdap) 11/03/2030   Pneumococcal Vaccine for age over 77  Completed   Hepatitis C Screening  Completed   Zoster (Shingles) Vaccine  Completed   Hepatitis B Vaccine  Aged Out   HPV Vaccine  Aged Out   Meningitis B Vaccine  Aged Out  *Topic was postponed. The date shown is not the original due date.    See attachments for Preventive Care and Fall Prevention Tips.

## 2023-10-15 NOTE — Telephone Encounter (Signed)
 Pt had AWV today.  Did not start Jardiance  due to cost. Has OV with PCP on 9/18 and said he will discuss at that time.

## 2023-11-05 ENCOUNTER — Other Ambulatory Visit (HOSPITAL_BASED_OUTPATIENT_CLINIC_OR_DEPARTMENT_OTHER): Payer: Self-pay

## 2023-11-05 DIAGNOSIS — L720 Epidermal cyst: Secondary | ICD-10-CM | POA: Diagnosis not present

## 2023-11-05 MED ORDER — MUPIROCIN 2 % EX OINT
TOPICAL_OINTMENT | CUTANEOUS | 0 refills | Status: DC
  Filled 2023-11-05: qty 22, 30d supply, fill #0

## 2023-11-05 MED ORDER — DOXYCYCLINE HYCLATE 100 MG PO TABS
100.0000 mg | ORAL_TABLET | Freq: Two times a day (BID) | ORAL | 0 refills | Status: DC
Start: 2023-11-05 — End: 2023-11-27
  Filled 2023-11-05: qty 10, 5d supply, fill #0

## 2023-11-23 NOTE — Assessment & Plan Note (Signed)
 Tolerating statin, encouraged heart healthy diet, avoid trans fats, minimize simple carbs and saturated fats. Increase exercise as tolerated

## 2023-11-23 NOTE — Assessment & Plan Note (Addendum)
 hgba1c acceptable, minimize simple carbs. Increase exercise as tolerated. Continue current meds, cost of Jardiance  is prohibitive

## 2023-11-23 NOTE — Assessment & Plan Note (Signed)
 Well controlled, no changes to meds. Encouraged heart healthy diet such as the DASH diet and exercise as tolerated.

## 2023-11-27 ENCOUNTER — Encounter: Payer: Self-pay | Admitting: Family Medicine

## 2023-11-27 ENCOUNTER — Ambulatory Visit: Admitting: Family Medicine

## 2023-11-27 VITALS — BP 130/72 | HR 62 | Resp 16 | Ht 65.0 in | Wt 172.8 lb

## 2023-11-27 DIAGNOSIS — Z23 Encounter for immunization: Secondary | ICD-10-CM | POA: Diagnosis not present

## 2023-11-27 DIAGNOSIS — I10 Essential (primary) hypertension: Secondary | ICD-10-CM

## 2023-11-27 DIAGNOSIS — E119 Type 2 diabetes mellitus without complications: Secondary | ICD-10-CM

## 2023-11-27 DIAGNOSIS — E782 Mixed hyperlipidemia: Secondary | ICD-10-CM

## 2023-11-27 DIAGNOSIS — Z7984 Long term (current) use of oral hypoglycemic drugs: Secondary | ICD-10-CM

## 2023-11-27 LAB — CBC WITH DIFFERENTIAL/PLATELET
Basophils Absolute: 0.1 K/uL (ref 0.0–0.1)
Basophils Relative: 0.9 % (ref 0.0–3.0)
Eosinophils Absolute: 0.2 K/uL (ref 0.0–0.7)
Eosinophils Relative: 2.7 % (ref 0.0–5.0)
HCT: 41.9 % (ref 39.0–52.0)
Hemoglobin: 14.4 g/dL (ref 13.0–17.0)
Lymphocytes Relative: 31.6 % (ref 12.0–46.0)
Lymphs Abs: 2.2 K/uL (ref 0.7–4.0)
MCHC: 34.2 g/dL (ref 30.0–36.0)
MCV: 93.6 fl (ref 78.0–100.0)
Monocytes Absolute: 0.7 K/uL (ref 0.1–1.0)
Monocytes Relative: 10.3 % (ref 3.0–12.0)
Neutro Abs: 3.8 K/uL (ref 1.4–7.7)
Neutrophils Relative %: 54.5 % (ref 43.0–77.0)
Platelets: 240 K/uL (ref 150.0–400.0)
RBC: 4.48 Mil/uL (ref 4.22–5.81)
RDW: 13.5 % (ref 11.5–15.5)
WBC: 6.9 K/uL (ref 4.0–10.5)

## 2023-11-27 LAB — COMPREHENSIVE METABOLIC PANEL WITH GFR
ALT: 14 U/L (ref 0–53)
AST: 14 U/L (ref 0–37)
Albumin: 4.4 g/dL (ref 3.5–5.2)
Alkaline Phosphatase: 60 U/L (ref 39–117)
BUN: 12 mg/dL (ref 6–23)
CO2: 31 meq/L (ref 19–32)
Calcium: 9.3 mg/dL (ref 8.4–10.5)
Chloride: 101 meq/L (ref 96–112)
Creatinine, Ser: 0.98 mg/dL (ref 0.40–1.50)
GFR: 73.9 mL/min (ref >60.00)
Glucose, Bld: 90 mg/dL (ref 70–99)
Potassium: 4.1 meq/L (ref 3.5–5.1)
Sodium: 138 meq/L (ref 135–145)
Total Bilirubin: 0.9 mg/dL (ref 0.2–1.2)
Total Protein: 7.1 g/dL (ref 6.0–8.3)

## 2023-11-27 LAB — LIPID PANEL
Cholesterol: 100 mg/dL (ref 0–200)
HDL: 43.1 mg/dL (ref >39.00)
LDL Cholesterol: 26 mg/dL (ref 0–99)
NonHDL: 56.63
Total CHOL/HDL Ratio: 2
Triglycerides: 151 mg/dL — ABNORMAL HIGH (ref 0.0–149.0)
VLDL: 30.2 mg/dL (ref 0.0–40.0)

## 2023-11-27 LAB — TSH: TSH: 3.05 u[IU]/mL (ref 0.35–5.50)

## 2023-11-27 LAB — HEMOGLOBIN A1C: Hgb A1c MFr Bld: 6.9 % — ABNORMAL HIGH (ref 4.6–6.5)

## 2023-11-27 NOTE — Patient Instructions (Addendum)
 Annual covid and flu RSV, Respiratory Syncitial Virus Vaccine, Arexvy at pharmacy  [facility] pharmacy walk in for vaccines M-F 9-4  Hypertension, Adult High blood pressure (hypertension) is when the force of blood pumping through the arteries is too strong. The arteries are the blood vessels that carry blood from the heart throughout the body. Hypertension forces the heart to work harder to pump blood and may cause arteries to become narrow or stiff. Untreated or uncontrolled hypertension can lead to a heart attack, heart failure, a stroke, kidney disease, and other problems. A blood pressure reading consists of a higher number over a lower number. Ideally, your blood pressure should be below 120/80. The first (top) number is called the systolic pressure. It is a measure of the pressure in your arteries as your heart beats. The second (bottom) number is called the diastolic pressure. It is a measure of the pressure in your arteries as the heart relaxes. What are the causes? The exact cause of this condition is not known. There are some conditions that result in high blood pressure. What increases the risk? Certain factors may make you more likely to develop high blood pressure. Some of these risk factors are under your control, including: Smoking. Not getting enough exercise or physical activity. Being overweight. Having too much fat, sugar, calories, or salt (sodium) in your diet. Drinking too much alcohol. Other risk factors include: Having a personal history of heart disease, diabetes, high cholesterol, or kidney disease. Stress. Having a family history of high blood pressure and high cholesterol. Having obstructive sleep apnea. Age. The risk increases with age. What are the signs or symptoms? High blood pressure may not cause symptoms. Very high blood pressure (hypertensive crisis) may cause: Headache. Fast or irregular heartbeats (palpitations). Shortness of  breath. Nosebleed. Nausea and vomiting. Vision changes. Severe chest pain, dizziness, and seizures. How is this diagnosed? This condition is diagnosed by measuring your blood pressure while you are seated, with your arm resting on a flat surface, your legs uncrossed, and your feet flat on the floor. The cuff of the blood pressure monitor will be placed directly against the skin of your upper arm at the level of your heart. Blood pressure should be measured at least twice using the same arm. Certain conditions can cause a difference in blood pressure between your right and left arms. If you have a high blood pressure reading during one visit or you have normal blood pressure with other risk factors, you may be asked to: Return on a different day to have your blood pressure checked again. Monitor your blood pressure at home for 1 week or longer. If you are diagnosed with hypertension, you may have other blood or imaging tests to help your health care provider understand your overall risk for other conditions. How is this treated? This condition is treated by making healthy lifestyle changes, such as eating healthy foods, exercising more, and reducing your alcohol intake. You may be referred for counseling on a healthy diet and physical activity. Your health care provider may prescribe medicine if lifestyle changes are not enough to get your blood pressure under control and if: Your systolic blood pressure is above 130. Your diastolic blood pressure is above 80. Your personal target blood pressure may vary depending on your medical conditions, your age, and other factors. Follow these instructions at home: Eating and drinking  Eat a diet that is high in fiber and potassium, and low in sodium, added sugar, and fat.  An example of this eating plan is called the DASH diet. DASH stands for Dietary Approaches to Stop Hypertension. To eat this way: Eat plenty of fresh fruits and vegetables. Try to fill  one half of your plate at each meal with fruits and vegetables. Eat whole grains, such as whole-wheat pasta, brown rice, or whole-grain bread. Fill about one fourth of your plate with whole grains. Eat or drink low-fat dairy products, such as skim milk or low-fat yogurt. Avoid fatty cuts of meat, processed or cured meats, and poultry with skin. Fill about one fourth of your plate with lean proteins, such as fish, chicken without skin, beans, eggs, or tofu. Avoid pre-made and processed foods. These tend to be higher in sodium, added sugar, and fat. Reduce your daily sodium intake. Many people with hypertension should eat less than 1,500 mg of sodium a day. Do not drink alcohol if: Your health care provider tells you not to drink. You are pregnant, may be pregnant, or are planning to become pregnant. If you drink alcohol: Limit how much you have to: 0-1 drink a day for women. 0-2 drinks a day for men. Know how much alcohol is in your drink. In the [place] one drink equals one 12 oz bottle of beer (355 mL), one 5 oz glass of wine (148 mL), or one 1 oz glass of hard liquor (44 mL). Lifestyle  Work with your health care provider to maintain a healthy body weight or to lose weight. Ask what an ideal weight is for you. Get at least 30 minutes of exercise that causes your heart to beat faster (aerobic exercise) most days of the week. Activities may include walking, swimming, or biking. Include exercise to strengthen your muscles (resistance exercise), such as Pilates or lifting weights, as part of your weekly exercise routine. Try to do these types of exercises for 30 minutes at least 3 days a week. Do not use any products that contain nicotine or tobacco. These products include cigarettes, chewing tobacco, and vaping devices, such as e-cigarettes. If you need help quitting, ask your health care provider. Monitor your blood pressure at home as told by your health care provider. Keep all follow-up visits.  This is important. Medicines Take over-the-counter and prescription medicines only as told by your health care provider. Follow directions carefully. Blood pressure medicines must be taken as prescribed. Do not skip doses of blood pressure medicine. Doing this puts you at risk for problems and can make the medicine less effective. Ask your health care provider about side effects or reactions to medicines that you should watch for. Contact a health care provider if you: Think you are having a reaction to a medicine you are taking. Have headaches that keep coming back (recurring). Feel dizzy. Have swelling in your ankles. Have trouble with your vision. Get help right away if you: Develop a severe headache or confusion. Have unusual weakness or numbness. Feel faint. Have severe pain in your chest or abdomen. Vomit repeatedly. Have trouble breathing. These symptoms may be an emergency. Get help right away. Call 911. Do not wait to see if the symptoms will go away. Do not drive yourself to the hospital. Summary Hypertension is when the force of blood pumping through your arteries is too strong. If this condition is not controlled, it may put you at risk for serious complications. Your personal target blood pressure may vary depending on your medical conditions, your age, and other factors. For most people, a normal blood pressure  is less than 120/80. Hypertension is treated with lifestyle changes, medicines, or a combination of both. Lifestyle changes include losing weight, eating a healthy, low-sodium diet, exercising more, and limiting alcohol. This information is not intended to replace advice given to you by your health care provider. Make sure you discuss any questions you have with your health care provider. Document Revised: 01/02/2021 Document Reviewed: 01/02/2021 Elsevier Patient Education  2024 [employer].

## 2023-11-27 NOTE — Progress Notes (Signed)
 Subjective:    Patient ID: Anthony [name], male    DOB: October 31, 1945, 78 y.o.   MRN: [id number]  Chief Complaint  Patient presents with   Medical Management of Chronic Issues    Patient presents today for a 6 month follow-up.   Quality Metric Gaps    Eye exam    HPI Discussed the use of AI scribe software for clinical note transcription with the patient, who gave verbal consent to proceed.  History of Present Illness Anthony [name] is a 78 year old male who presents for a follow-up visit after removal of a cyst on the back of his neck.  Two weeks ago, he had a cyst removed from the back of his neck by a dermatologist.  He has been experiencing a persistent cough that worsens with outdoor activity, describing it as a 'tickle' in his throat. No fever or productive cough with green sputum. He has been using cough medicine and cough drops for relief.  He maintains an active lifestyle, including gardening and donating produce to food banks. He is considering volunteering during the winter months. He reflects on his role in his family, expressing concerns about his family's well-being without him, and mentions helping his granddaughter with her new house in [place].    Past Medical History:  Diagnosis Date   Allergic rhinitis    BPH (benign prostatic hyperplasia)    CAD (coronary artery disease) 05/26/2009 :  primary cardiolgoist-  dr pietro   hx NSTEMI cardiac catherization  revealed-left main normal; LAD proximal calcification,  long proximal 30% stenosis,  Circumflex  AV groove   proximal  60% stenosis at mid obtuse marginal,  first large mid obtuse marginal  ostial 70% stenosis,  prox. to mid RCA  99% stenosis w/ intervention PCI and DES   Carotid stenosis, asymptomatic, bilateral    per last duplex 03-31-2015  bilateral ICA 1-39%   Cataract    removed   Controlled type 2 diabetes mellitus without complication, without long-term current use of insulin (HCC) 11/22/2022    External hemorrhoids    large external   GERD (gastroesophageal reflux disease)    Glaucoma    History of adenomatous polyp of colon    History of kidney stones    History of non-ST elevation myocardial infarction (NSTEMI) 05/26/2009   Hyperlipidemia    Hypertension    8-10 years   Left ureteral stone    Macular degeneration    Myocardial infarction [facility])    Nephrolithiasis    right side non-obstructive per CT 01-03-2017   S/P drug eluting coronary stent placement 05/26/2009   DESx1  to pRCA   Wears glasses     Past Surgical History:  Procedure Laterality Date   CARDIOVASCULAR STRESS TEST  12/26/2011   dr pietro   normal nuclear study w/ no ischemia/  normal LV function and wall motion , ef 71%   CATARACT EXTRACTION W/ INTRAOCULAR LENS  IMPLANT, BILATERAL  2010   COLONOSCOPY  last one 06-10-2016   CORONARY ANGIOPLASTY WITH STENT PLACEMENT  05-26-2009  dr hochrein/ dr wolm brodie   Severe RCA stenosis and moderate stenosis in the left system, ef 50% with inferior hypokinsis:  PCI and DES x1 to RCA ([facility])   CYSTOSCOPY W/ RETROGRADES Bilateral 09/29/2013   Procedure: CYSTOSCOPY WITH RETROGRADE PYELOGRAM;  Surgeon: Ricardo Likens, MD;  Location: [facility];  Service: Urology;  Laterality: Bilateral;   CYSTOSCOPY WITH RETROGRADE PYELOGRAM, URETEROSCOPY AND STENT PLACEMENT N/A 06/14/2015  Procedure: CYSTOSCOPY WITH RETROGRADE PYELOGRAM, cysto litholapexy ;  Surgeon: Ricardo Likens, MD;  Location: [facility];  Service: Urology;  Laterality: N/A;   CYSTOSCOPY/URETEROSCOPY/HOLMIUM LASER/STENT PLACEMENT Left 01/07/2017   Procedure: CYSTOSCOPY/RETROGRADE/URETEROSCOPY/HOLMIUM LASER/STENT PLACEMENT, FULGERATION OF PROSTATIC URETHRA;  Surgeon: Devere Lonni Righter, MD;  Location: [facility];  Service: Urology;  Laterality: Left;   CYSTOSCOPY/URETEROSCOPY/HOLMIUM LASER/STENT PLACEMENT Right 01/12/2020   Procedure: CYSTOSCOPY/ RIGHT RETROGRADE/ RIGHT  URETEROSCOPY/ RIGHT HOLMIUM LASER/ RIGHT STENT PLACEMENT;  Surgeon: Devere Lonni Righter, MD;  [facility];  Service: Urology;  Laterality: Right;  ONLY NEEDS 60 MIN   EYE SURGERY     HOLMIUM LASER APPLICATION N/A 06/14/2015   Procedure: HOLMIUM LASER APPLICATION bladder stone;  Surgeon: Ricardo Likens, MD;  Location: [facility];  Service: Urology;  Laterality: N/A;   HOLMIUM LASER APPLICATION Left 01/07/2017   Procedure: HOLMIUM LASER APPLICATION;  Surgeon: Devere Lonni Righter, MD;  Location: [facility];  Service: Urology;  Laterality: Left;   NEPHROLITHOTOMY Left 09/29/2013   Procedure: NEPHROLITHOTOMY PERCUTANEOUS WITH ACCESS;  Surgeon: Ricardo Likens, MD;  Location: [facility];  Service: Urology;  Laterality: Left;   URETEROSCOPY N/A 09/29/2013   Procedure: URETEROSCOPY;  Surgeon: Ricardo Likens, MD;  Location: [facility];  Service: Urology;  Laterality: N/A;    Family History  Problem Relation Age of Onset   Coronary artery disease Father        in his 49's   Hyperlipidemia Father    Hypertension Father    Hearing loss Father    Heart disease Mother        deceased of heart disease   Hypertension Mother    Hyperlipidemia Mother    Multiple sclerosis Son    Heart disease Son    Heart disease Maternal Grandmother    Heart disease Maternal Grandfather    Heart disease Paternal Grandmother    Heart disease Paternal Grandfather    GER disease Sister    Heart disease Paternal Aunt    Other Neg Hx        no lung cancer, colon cancer, or prostate   Colon cancer Neg Hx    Stomach cancer Neg Hx    Esophageal cancer Neg Hx     Social History   Socioeconomic History   Marital status: Married    Spouse name: Not on file   Number of children: Not on file   Years of education: Not on file   Highest education level: Associate degree: occupational, Scientist, product/process development, or vocational program  Occupational History   Not on file   Tobacco Use   Smoking status: Former    Current packs/day: 0.00    Types: Cigarettes    Start date: 03/11/1962    Quit date: 03/12/2007    Years since quitting: 16.7   Smokeless tobacco: Never  Substance and Sexual Activity   Alcohol use: No   Drug use: No   Sexual activity: Yes    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  He had     a 40 pack-year smoking history but has discontinued.     prev occuptation -  [employer]   Alcohol use-no   Smoking Status:  quit   Packs/Day:  0.5   Caffeine use/day:  2 beverages daily   Does Patient Exercise:  yes            Social Drivers of Corporate investment banker Strain: Low Risk  (10/09/2023)   Overall Financial Resource Strain (CARDIA)    Difficulty of Paying Living Expenses: Not hard at all  Food Insecurity: No Food Insecurity (10/09/2023)   Hunger Vital Sign    Worried About Running Out of Food in the Last Year: Never true    Ran Out of Food in the Last Year: Never true  Transportation Needs: No Transportation Needs (10/15/2023)   PRAPARE - Administrator, Civil Service (Medical): No    Lack of Transportation (Non-Medical): No  Physical Activity: Sufficiently Active (10/09/2023)   Exercise Vital Sign    Days of Exercise per Week: 7 days    Minutes of Exercise per Session: 150+ min  Stress: No Stress Concern Present (10/09/2023)   [employer] of Occupational Health - Occupational Stress Questionnaire    Feeling of Stress: Not at all  Social Connections: Moderately Integrated (10/09/2023)   Social Connection and Isolation Panel    Frequency of Communication with Friends and Family: More than three times a week    Frequency of Social Gatherings with Friends and Family: More than three times a week     Attends Religious Services: 1 to 4 times per year    Active Member of [employer] or Organizations: No    Attends Engineer, structural: Not on file    Marital Status: Married  Catering manager Violence: Not At Risk (10/15/2023)   Humiliation, Afraid, Rape, and Kick questionnaire    Fear of Current or Ex-Partner: No    Emotionally Abused: No    Physically Abused: No    Sexually Abused: No    Outpatient Medications Prior to Visit  Medication Sig Dispense Refill   Accu-Chek Softclix Lancets lancets Use to check blood sugar once a day  E11.9 100 each 1   amLODipine  (NORVASC ) 10 MG tablet TAKE 1 TABLET BY MOUTH IN THE  MORNING 100 tablet 2   ascorbic acid (VITAMIN C) 500 MG tablet Take 500 mg by mouth daily.     aspirin  81 MG tablet Take 81 mg by mouth every morning.     Blood Glucose Monitoring Suppl (BLOOD GLUCOSE MONITOR SYSTEM) w/Device KIT Use to check blood sugar in the morning, at noon, and at bedtime. 1 kit 0   gabapentin (NEURONTIN) 300 MG capsule Take 300 mg by mouth 2 (two) times daily. One capsule at lunch and two capsule at bedtime     glucose blood (ACCU-CHEK GUIDE TEST) test strip Use to check blood sugar once a day  E11.9 100 each 1   hydrochlorothiazide  (MICROZIDE ) 12.5 MG capsule TAKE 1 CAPSULE BY MOUTH DAILY 100 capsule 3   losartan  (COZAAR ) 100 MG tablet TAKE 1 TABLET BY MOUTH IN THE  MORNING 100 tablet 2   metoprolol  tartrate (LOPRESSOR ) 25 MG tablet TAKE ONE-HALF TABLET BY MOUTH  TWICE DAILY 100 tablet 3   Multiple Vitamins-Minerals (ICAPS PO) Take by mouth every morning.      potassium citrate  (UROCIT-K ) 10 MEQ (1080 MG) SR tablet Take 10 mEq by mouth in the morning and at bedtime.   3   rosuvastatin  (CRESTOR ) 40 MG tablet TAKE 1 TABLET BY MOUTH DAILY 100 tablet 3   doxycycline  (VIBRA -TABS) 100 MG tablet Take 1 tablet (100 mg  total) by mouth 2 (two) times daily. 10 tablet 0   empagliflozin  (JARDIANCE ) 10 MG TABS tablet Take 1 tablet (10 mg total) by mouth daily before  breakfast. (Patient not taking: Reported on 10/15/2023) 30 tablet 3   mupirocin  ointment (BACTROBAN ) 2 % Apply a small amount to affected area twice a day to left leg until fully healed. 22 g 0   No facility-administered medications prior to visit.    No Known Allergies  Review of Systems  Constitutional:  Negative for fever and malaise/fatigue.  HENT:  Positive for congestion.   Eyes:  Negative for blurred vision.  Respiratory:  Positive for cough. Negative for shortness of breath.   Cardiovascular:  Negative for chest pain, palpitations and leg swelling.  Gastrointestinal:  Negative for abdominal pain, blood in stool and nausea.  Genitourinary:  Negative for dysuria and frequency.  Musculoskeletal:  Negative for falls.  Skin:  Negative for rash.  Neurological:  Negative for dizziness, loss of consciousness and headaches.  Endo/Heme/Allergies:  Negative for environmental allergies.  Psychiatric/Behavioral:  Negative for depression. The patient is not nervous/anxious.        Objective:    Physical Exam Vitals reviewed.  Constitutional:      Appearance: Normal appearance. He is not ill-appearing.  HENT:     Head: Normocephalic and atraumatic.     Nose: Nose normal.  Eyes:     Conjunctiva/sclera: Conjunctivae normal.  Cardiovascular:     Rate and Rhythm: Normal rate.     Pulses: Normal pulses.     Heart sounds: Normal heart sounds. No murmur heard. Pulmonary:     Effort: Pulmonary effort is normal.     Breath sounds: Normal breath sounds. No wheezing.  Abdominal:     Palpations: Abdomen is soft. There is no mass.     Tenderness: There is no abdominal tenderness.  Musculoskeletal:     Cervical back: Normal range of motion.     Right lower leg: No edema.     Left lower leg: No edema.  Skin:    General: Skin is warm and dry.  Neurological:     General: No focal deficit present.     Mental Status: He is alert and oriented to person, place, and time.  Psychiatric:         Mood and Affect: Mood normal.     BP 130/72   Pulse 62   Resp 16   Ht 5' 5 (1.651 m)   Wt 172 lb 12.8 oz (78.4 kg)   SpO2 95%   BMI 28.76 kg/m  Wt Readings from Last 3 Encounters:  11/27/23 172 lb 12.8 oz (78.4 kg)  10/15/23 174 lb (78.9 kg)  05/26/23 174 lb 9.6 oz (79.2 kg)    Diabetic Foot Exam - Simple   No data filed    Lab Results  Component Value Date   WBC 6.9 05/26/2023   HGB 14.5 05/26/2023   HCT 43.0 05/26/2023   PLT 229.0 05/26/2023   GLUCOSE 107 (H) 05/26/2023   CHOL 92 05/26/2023   TRIG 132.0 05/26/2023   HDL 40.10 05/26/2023   LDLDIRECT 41.0 04/20/2019   LDLCALC 26 05/26/2023   ALT 14 05/26/2023   AST 13 05/26/2023   NA 140 05/26/2023   K 3.3 (L) 05/26/2023   CL 102 05/26/2023   CREATININE 0.96 05/26/2023   BUN 16 05/26/2023   CO2 30 05/26/2023   TSH 2.53 05/26/2023   PSA 1.70ng/mL 02/19/2018   INR 1.05 05/25/2009  HGBA1C 6.5 05/26/2023   MICROALBUR 6.8 (H) 05/26/2023    Lab Results  Component Value Date   TSH 2.53 05/26/2023   Lab Results  Component Value Date   WBC 6.9 05/26/2023   HGB 14.5 05/26/2023   HCT 43.0 05/26/2023   MCV 95.1 05/26/2023   PLT 229.0 05/26/2023   Lab Results  Component Value Date   NA 140 05/26/2023   K 3.3 (L) 05/26/2023   CO2 30 05/26/2023   GLUCOSE 107 (H) 05/26/2023   BUN 16 05/26/2023   CREATININE 0.96 05/26/2023   BILITOT 0.8 05/26/2023   ALKPHOS 67 05/26/2023   AST 13 05/26/2023   ALT 14 05/26/2023   PROT 7.0 05/26/2023   ALBUMIN 4.4 05/26/2023   CALCIUM  9.2 05/26/2023   ANIONGAP 12 09/30/2013   EGFR 89 03/28/2021   GFR 76.02 05/26/2023   Lab Results  Component Value Date   CHOL 92 05/26/2023   Lab Results  Component Value Date   HDL 40.10 05/26/2023   Lab Results  Component Value Date   LDLCALC 26 05/26/2023   Lab Results  Component Value Date   TRIG 132.0 05/26/2023   Lab Results  Component Value Date   CHOLHDL 2 05/26/2023   Lab Results  Component Value Date    HGBA1C 6.5 05/26/2023       Assessment & Plan:  Controlled type 2 diabetes mellitus without complication, without long-term current use of insulin (HCC) Assessment & Plan: hgba1c acceptable, minimize simple carbs. Increase exercise as tolerated. Continue current meds, cost of Jardiance  is prohibitive  Orders: -     Hemoglobin A1c  Hyperlipidemia, mixed Assessment & Plan: Tolerating statin, encouraged heart healthy diet, avoid trans fats, minimize simple carbs and saturated fats. Increase exercise as tolerated  Orders: -     Lipid panel -     TSH  Hypertension, unspecified type Assessment & Plan: Well controlled, no changes to meds. Encouraged heart healthy diet such as the DASH diet and exercise as tolerated.    Orders: -     Comprehensive metabolic panel with GFR -     CBC with Differential/Platelet  Need for influenza vaccination -     Flu vaccine HIGH DOSE PF(Fluzone Trivalent)    Assessment and Plan Assessment & Plan Allergic cough and allergic rhinitis Intermittent cough and throat irritation likely due to allergies. No fever or purulent sputum. Differential includes silent heartburn, but no heartburn symptoms present. Throat examination shows mild pinkness, not suggestive of heartburn. Allergic response suspected. - Recommend Claritin or Zyrtec  daily for two weeks. - Advise use of Flonase  nasal spray daily for two weeks. - Continue cough drops and cough syrup as needed. - If symptoms persist or worsen, consider referral to ENT for further evaluation. - Consider referral to allergy and asthma specialist if symptoms persist.  Hiatal hernia Hiatal hernia mentioned as a possible contributor to cough, but no heartburn symptoms reported. Silent heartburn considered in differential diagnosis. - Monitor for any development of heartburn symptoms. - If persistent cough continues, consider referral to GI for evaluation of hernia.  Recording duration: 23  minutes     Harlene Horton, MD

## 2023-11-28 ENCOUNTER — Ambulatory Visit: Payer: Self-pay | Admitting: Family Medicine

## 2023-11-28 NOTE — Progress Notes (Signed)
 Patient reviewed via MyChart.

## 2024-01-15 NOTE — Progress Notes (Signed)
 Otolaryngology Clinic Note  HPI:    Cerumen Impaction (Pt presents today for ear cleaning.)  History of Present Illness Anthony [name] is a 78 year old male who presents for evaluation of cerumen impaction. He was last seen about a year ago.  He reports no current issues with his ears, indicating that the previous sensation of ear blockage in his left ear and left ear fullness reported last year has resolved. He has been managing cerumen accumulation with the use of peroxide as needed.  PMH/Meds/All/SocHx/FamHx/ROS:   Medical History[1]  Surgical History[2]  No family history of bleeding disorders, wound healing problems or difficulty with anesthesia.   Social History   Socioeconomic History   Marital status: Married    Spouse name: Not on file   Number of children: Not on file   Years of education: Not on file   Highest education level: Not on file  Occupational History   Not on file  Tobacco Use   Smoking status: Never   Smokeless tobacco: Never  Vaping Use   Vaping status: Never Used  Substance and Sexual Activity   Alcohol use: No   Drug use: No   Sexual activity: Not on file  Other Topics Concern   Not on file  Social History Narrative   Not on file   Social Drivers of Health   Food Insecurity: No Food Insecurity (10/09/2023)   Received from [facility]   Food vital sign    Within the past 12 months, you worried that your food would run out before you got money to buy more: Never true    Within the past 12 months, the food you bought just didn't last and you didn't have money to get more: Never true  Transportation Needs: No Transportation Needs (10/15/2023)   Received from [facility] - Transportation    In the past 12 months, has lack of transportation kept you from medical appointments or from getting medications?: No    In the past 12 months, has lack of transportation kept you from meetings, work, or from getting things needed for  daily living?: No  Safety: Not At Risk (10/15/2023)   Received from [facility]   Safety    Within the last year, have you been afraid of your partner or ex-partner?: No    Within the last year, have you been humiliated or emotionally abused in other ways by your partner or ex-partner?: No    Within the last year, have you been kicked, hit, slapped, or otherwise physically hurt by your partner or ex-partner?: No    Within the last year, have you been raped or forced to have any kind of sexual activity by your partner or ex-partner?: No  Living Situation: Low Risk  (10/09/2023)   Received from [facility] Situation    In the last 12 months, was there a time when you were not able to pay the mortgage or rent on time?: No    In the past 12 months, how many times have you moved where you were living?: 0    At any time in the past 12 months, were you homeless or living in a shelter (including now)?: No    Current Medications[3]  A complete ROS was performed with pertinent positives/negatives noted in the HPI. The remainder of the ROS are negative.    Physical Exam:    BP 130/71 (BP Location: Left arm, Patient Position: Sitting)   Pulse 66  Temp 97.7 F (36.5 C) (Temporal)   Ht 1.676 m (5' 6)   Wt 79.7 kg (175 lb 9.6 oz)   BMI 28.34 kg/m    General: Well developed, well nourished. No acute distress. Voice normal.  Head/Face: Normocephalic, atraumatic. No scars or lesions. No sinus tenderness. Facial nerve intact and equal bilaterally. No facial lacerations. TMJ nontender to palpation.  Eyes: Pupils are equal, round and reactive to light. Conjunctiva and lids are normal. Normal extraocular mobility.   Ears:   Right: Pinna and external meatus normal, normal ear canal skin and caliber without drainage. Impacted cerumen, cleared under otomicroscopy, once cleared tympanic membrane intact and without effusion.   Left: Pinna and external meatus normal, normal ear canal skin and  caliber without drainage. Impacted cerumen, cleared under otomicroscopy, once cleared tympanic membrane intact and without effusion.  Hearing: Normal speech reception to conversational tone.  Nose: No gross deformity or lesions. No purulent discharge. Septum midline, no turbinate hypertrophy with normal mucosa.  Mouth/Oropharynx: Lips normal, teeth and gums normal with good dentition, normal oral vestibule. Normal floor of mouth, tongue and oral mucosa, no mucosal lesions, ulcer or mass, normal tongue mobility. Uvula midline. Hard and soft palate normal with normal mobility. +1 tonsils, no erythema or exudate.  Larynx: See TFL if applicable  Nasopharynx: See TFL if applicable  Neck: Trachea midline. No masses. No crepitus. Normal thyroid  gland palpation without thyromegaly or nodules palpated. Salivary glands normal to palpation without swelling, erythema or mass.   Lymphatic: No lymphadenopathy in the neck.  Respiratory: No stridor or distress.  Extremities: No edema or cyanosis. Warm and well-perfused.  Neurologic: CN II-XII intact. Alert and oriented to self, place and time.  Normal reflexes and motor skills, balance and coordination. Moving all extremities without gross abnormality.  Psychiatric:  No unusual anxiety or evidence of depression. Appropriate affect.    Independent Review of Additional Tests or Records:   Medical Records  Review of ENT notes from this practice and 1 test tympanogram as below since patient was having symptoms of ear fullness at visit 1 year ago.  Results Diagnostic Testing  - Tympanogram: Normal type A on the right and left, type C (12/16/22)  Procedures:   Ear Cerumen Removal  Date/Time: 01/15/2024 8:30 AM  Performed by: Olam Vina Simpler, NP Authorized by: Olam Vina Simpler, NP   Procedure Details:  Impacted cerumen: Yes  Cerumen removal location: bilateral Procedure type: curette (suction 5-0, 7-0)  Post Procedure Details:  Procedure findings:  complete impaction removal Patient tolerance of procedure: patient tolerated the procedure without difficulty Comments: The patient was positioned and the ear was examined with the microscope. Obstructing cerumen was gently removed using suction from both ear canals. TMs fully visualized, normal and intact. Middle ears aerated. No sign of infection or cholesteatoma.     Impression & Plans:  Anthony [name] is a 78 y.o. male with   1. Bilateral impacted cerumen       Assessment & Plan 1. Cerumen impaction, bilateral Significant buildup of earwax was noted in both ears. Olive oil, mineral oil, or baby oil can be used in the ears every 2 to 4 weeks to help soften the wax. A follow-up appointment is scheduled for 6 months to monitor and manage the condition.  PROCEDURE Procedure: Cerumen removal - Procedural Discussion: Discussed the buildup of earwax and the need for removal. Explained that earwax has protective antibacterial properties but can accumulate excessively in some individuals. - Technique: Used suction to  remove earwax from both ears. - Post-Procedural Discussion: Recommended follow-up in 6 months for earwax removal. Suggested using olive oil or mineral oil drops to keep earwax soft and easier to remove.  Medical Decision Making  Patient is here for a return patient visit for evaluation and treatment of:   A. 1 acute uncomplicated illness (bilateral impacted cerumen), level 3: 99213   B. Review and analysis of results:   No unique test ordered no outside notes reviewed this is a low level of complexity of data for analysis and review. Level 2: [phone]  C: Risk of Complications: Over-the-counter drugs or treatments or products were recommended including: Olive oil or mineral oil used periodically for cerumen management, which poses a low risk of morbidity or mortality, level 3: 99213  Overall level of service: 99213  On the day of the visit I spent 24 minutes preparing to see  the patient, counseling and educating the patient/caregiver, and documenting clinical information in the electronic medical record.This time does not include any time spent performing procedures or assesments that are separately billable.     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/04/2012  [2] Past Surgical History: Procedure Laterality Date   CATARACT EXTRACTION     Procedure: CATARACT EXTRACTION   KIDNEY STONE SURGERY     Procedure: KIDNEY STONE SURGERY   OTHER SURGICAL HISTORY     Procedure: OTHER SURGICAL HISTORY (heart stent)  [3]  Current Outpatient Medications:    amLODIPine  (NORVASC ) 10 mg tablet, Take 10 mg by mouth Once Daily., Disp: , Rfl:    aspirin  81 mg EC tablet, Take 81 mg by mouth Once Daily., Disp: , Rfl:    gabapentin (NEURONTIN) 300 mg capsule, , Disp: , Rfl:    hydroCHLOROthiazide  (HYDRODIURIL ) 12.5 mg capsule, , Disp: , Rfl:    losartan  (COZAAR ) 100 mg tablet, Take 100 mg by mouth Once Daily., Disp: , Rfl:    metoprolol  tartrate (LOPRESSOR ) 25 mg tablet, Take 25 mg by mouth 2 (two) times a day., Disp: , Rfl:    multivitamin cap, Take 1 capsule by mouth Once Daily., Disp: , Rfl:    multivitamin with minerals (Daily Multivitamin-Minerals) tab, Take 1 tablet by mouth Once Daily., Disp: , Rfl:    nitroglycerin  (Nitrostat ) 0.4 mg SL tablet, Place 0.4 mg under the tongue every 5 (five) minutes as needed., Disp: , Rfl:    potassium citrate  (UROCIT-K ) 10 mEq (1,080 mg) CR tablet, , Disp: , Rfl:    rosuvastatin  (Crestor ) 40 mg tablet, Take 40 mg by mouth Once Daily., Disp: , Rfl:

## 2024-02-18 ENCOUNTER — Other Ambulatory Visit (HOSPITAL_BASED_OUTPATIENT_CLINIC_OR_DEPARTMENT_OTHER): Payer: Self-pay

## 2024-02-18 MED ORDER — CEPHALEXIN 500 MG PO CAPS
500.0000 mg | ORAL_CAPSULE | Freq: Three times a day (TID) | ORAL | 0 refills | Status: AC
  Filled 2024-02-18: qty 21, 7d supply, fill #0

## 2024-05-12 ENCOUNTER — Ambulatory Visit: Admitting: Cardiology

## 2024-06-28 ENCOUNTER — Encounter: Admitting: Family Medicine

## 2024-07-07 ENCOUNTER — Encounter: Admitting: Student

## 2024-10-15 ENCOUNTER — Ambulatory Visit
